# Patient Record
Sex: Female | Born: 1935 | Race: White | Hispanic: No | Smoking: Never smoker
Health system: Southern US, Community
[De-identification: ages and names within clinical notes are randomized; demographics above are authoritative.]

## PROBLEM LIST (undated history)

## (undated) DIAGNOSIS — M353 Polymyalgia rheumatica: Secondary | ICD-10-CM

## (undated) DIAGNOSIS — G8110 Spastic hemiplegia affecting unspecified side: Secondary | ICD-10-CM

## (undated) DIAGNOSIS — G459 Transient cerebral ischemic attack, unspecified: Secondary | ICD-10-CM

## (undated) DIAGNOSIS — M1990 Unspecified osteoarthritis, unspecified site: Secondary | ICD-10-CM

## (undated) DIAGNOSIS — Z8673 Personal history of transient ischemic attack (TIA), and cerebral infarction without residual deficits: Secondary | ICD-10-CM

## (undated) DIAGNOSIS — I272 Pulmonary hypertension, unspecified: Secondary | ICD-10-CM

## (undated) DIAGNOSIS — S8991XD Unspecified injury of right lower leg, subsequent encounter: Secondary | ICD-10-CM

## (undated) DIAGNOSIS — I480 Paroxysmal atrial fibrillation: Secondary | ICD-10-CM

## (undated) DIAGNOSIS — K922 Gastrointestinal hemorrhage, unspecified: Secondary | ICD-10-CM

## (undated) DIAGNOSIS — C189 Malignant neoplasm of colon, unspecified: Secondary | ICD-10-CM

## (undated) DIAGNOSIS — I639 Cerebral infarction, unspecified: Secondary | ICD-10-CM

## (undated) DIAGNOSIS — I4891 Unspecified atrial fibrillation: Secondary | ICD-10-CM

## (undated) DIAGNOSIS — R000 Tachycardia, unspecified: Secondary | ICD-10-CM

## (undated) DIAGNOSIS — I10 Essential (primary) hypertension: Secondary | ICD-10-CM

## (undated) DIAGNOSIS — I5032 Chronic diastolic (congestive) heart failure: Secondary | ICD-10-CM

## (undated) DIAGNOSIS — E162 Hypoglycemia, unspecified: Secondary | ICD-10-CM

## (undated) DIAGNOSIS — I5033 Acute on chronic diastolic (congestive) heart failure: Secondary | ICD-10-CM

## (undated) DIAGNOSIS — R0600 Dyspnea, unspecified: Secondary | ICD-10-CM

## (undated) DIAGNOSIS — R1011 Right upper quadrant pain: Secondary | ICD-10-CM

## (undated) DIAGNOSIS — M549 Dorsalgia, unspecified: Secondary | ICD-10-CM

## (undated) DIAGNOSIS — D649 Anemia, unspecified: Secondary | ICD-10-CM

## (undated) HISTORY — DX: Pulmonary hypertension, unspecified: I27.20

## (undated) HISTORY — DX: Dyspnea, unspecified: R06.00

## (undated) HISTORY — PX: EYE SURGERY: SHX253

## (undated) HISTORY — DX: Acute on chronic diastolic (congestive) heart failure: I50.33

## (undated) HISTORY — PX: BILATERAL OOPHORECTOMY: SHX1221

## (undated) HISTORY — DX: Chronic diastolic (congestive) heart failure: I50.32

## (undated) HISTORY — DX: Unspecified atrial fibrillation: I48.91

## (undated) HISTORY — DX: Malignant neoplasm of colon, unspecified: C18.9

## (undated) HISTORY — DX: Transient cerebral ischemic attack, unspecified: G45.9

## (undated) HISTORY — DX: Anemia, unspecified: D64.9

## (undated) HISTORY — DX: Right upper quadrant pain: R10.11

## (undated) HISTORY — DX: Paroxysmal atrial fibrillation: I48.0

## (undated) HISTORY — DX: Cerebral infarction, unspecified: I63.9

## (undated) HISTORY — DX: Gastrointestinal hemorrhage, unspecified: K92.2

## (undated) HISTORY — PX: COLON SURGERY: SHX602

## (undated) HISTORY — DX: Spastic hemiplegia affecting unspecified side: G81.10

## (undated) HISTORY — DX: Tachycardia, unspecified: R00.0

## (undated) HISTORY — DX: Unspecified injury of right lower leg, subsequent encounter: S89.91XD

## (undated) HISTORY — DX: Polymyalgia rheumatica: M35.3

## (undated) HISTORY — DX: Personal history of transient ischemic attack (TIA), and cerebral infarction without residual deficits: Z86.73

---

## 1999-10-15 ENCOUNTER — Inpatient Hospital Stay (HOSPITAL_COMMUNITY): Admission: EM | Admit: 1999-10-15 | Discharge: 1999-10-16 | Payer: Self-pay | Admitting: Emergency Medicine

## 1999-10-15 ENCOUNTER — Encounter: Payer: Self-pay | Admitting: Geriatric Medicine

## 1999-11-27 ENCOUNTER — Encounter: Admission: RE | Admit: 1999-11-27 | Discharge: 1999-11-27 | Payer: Self-pay | Admitting: Geriatric Medicine

## 1999-11-27 ENCOUNTER — Encounter: Payer: Self-pay | Admitting: Geriatric Medicine

## 2000-05-06 ENCOUNTER — Encounter: Admission: RE | Admit: 2000-05-06 | Discharge: 2000-05-06 | Payer: Self-pay | Admitting: Specialist

## 2000-05-06 ENCOUNTER — Encounter: Payer: Self-pay | Admitting: Specialist

## 2000-10-23 ENCOUNTER — Other Ambulatory Visit: Admission: RE | Admit: 2000-10-23 | Discharge: 2000-10-23 | Payer: Self-pay | Admitting: Geriatric Medicine

## 2000-12-10 ENCOUNTER — Encounter: Admission: RE | Admit: 2000-12-10 | Discharge: 2000-12-10 | Payer: Self-pay | Admitting: Geriatric Medicine

## 2000-12-10 ENCOUNTER — Encounter: Payer: Self-pay | Admitting: Geriatric Medicine

## 2001-12-14 ENCOUNTER — Encounter: Payer: Self-pay | Admitting: General Surgery

## 2001-12-14 ENCOUNTER — Encounter: Admission: RE | Admit: 2001-12-14 | Discharge: 2001-12-14 | Payer: Self-pay | Admitting: Geriatric Medicine

## 2002-09-20 ENCOUNTER — Encounter: Admission: RE | Admit: 2002-09-20 | Discharge: 2002-09-20 | Payer: Self-pay | Admitting: Geriatric Medicine

## 2002-09-20 ENCOUNTER — Encounter: Payer: Self-pay | Admitting: Geriatric Medicine

## 2003-01-13 ENCOUNTER — Encounter: Admission: RE | Admit: 2003-01-13 | Discharge: 2003-01-13 | Payer: Self-pay | Admitting: Geriatric Medicine

## 2003-01-13 ENCOUNTER — Encounter: Payer: Self-pay | Admitting: Geriatric Medicine

## 2004-01-22 ENCOUNTER — Encounter: Admission: RE | Admit: 2004-01-22 | Discharge: 2004-01-22 | Payer: Self-pay | Admitting: Geriatric Medicine

## 2004-08-05 ENCOUNTER — Other Ambulatory Visit: Admission: RE | Admit: 2004-08-05 | Discharge: 2004-08-05 | Payer: Self-pay | Admitting: Geriatric Medicine

## 2005-03-04 ENCOUNTER — Encounter: Admission: RE | Admit: 2005-03-04 | Discharge: 2005-03-04 | Payer: Self-pay | Admitting: Geriatric Medicine

## 2006-01-27 ENCOUNTER — Encounter: Admission: RE | Admit: 2006-01-27 | Discharge: 2006-01-27 | Payer: Self-pay | Admitting: Geriatric Medicine

## 2006-02-02 ENCOUNTER — Encounter: Admission: RE | Admit: 2006-02-02 | Discharge: 2006-02-02 | Payer: Self-pay | Admitting: Urology

## 2006-02-06 ENCOUNTER — Encounter: Admission: RE | Admit: 2006-02-06 | Discharge: 2006-02-06 | Payer: Self-pay | Admitting: Geriatric Medicine

## 2006-02-12 ENCOUNTER — Encounter: Admission: RE | Admit: 2006-02-12 | Discharge: 2006-02-12 | Payer: Self-pay | Admitting: Geriatric Medicine

## 2006-02-27 ENCOUNTER — Encounter: Admission: RE | Admit: 2006-02-27 | Discharge: 2006-02-27 | Payer: Self-pay | Admitting: Geriatric Medicine

## 2006-03-11 ENCOUNTER — Encounter: Admission: RE | Admit: 2006-03-11 | Discharge: 2006-03-11 | Payer: Self-pay | Admitting: Geriatric Medicine

## 2006-03-24 ENCOUNTER — Encounter: Admission: RE | Admit: 2006-03-24 | Discharge: 2006-03-24 | Payer: Self-pay | Admitting: Geriatric Medicine

## 2007-03-16 ENCOUNTER — Encounter: Admission: RE | Admit: 2007-03-16 | Discharge: 2007-03-16 | Payer: Self-pay | Admitting: Geriatric Medicine

## 2008-03-16 ENCOUNTER — Encounter: Admission: RE | Admit: 2008-03-16 | Discharge: 2008-03-16 | Payer: Self-pay | Admitting: Geriatric Medicine

## 2009-03-29 ENCOUNTER — Encounter: Admission: RE | Admit: 2009-03-29 | Discharge: 2009-03-29 | Payer: Self-pay | Admitting: Geriatric Medicine

## 2009-06-19 ENCOUNTER — Encounter: Admission: RE | Admit: 2009-06-19 | Discharge: 2009-06-19 | Payer: Self-pay | Admitting: Otolaryngology

## 2010-02-26 ENCOUNTER — Other Ambulatory Visit: Admission: RE | Admit: 2010-02-26 | Discharge: 2010-02-26 | Payer: Self-pay | Admitting: Geriatric Medicine

## 2010-03-25 ENCOUNTER — Encounter: Admission: RE | Admit: 2010-03-25 | Discharge: 2010-03-25 | Payer: Self-pay | Admitting: Geriatric Medicine

## 2010-11-22 NOTE — Cardiovascular Report (Signed)
[place]. [facility]  Patient:    Krystal Hensley, Krystal Hensley                       MRN: [id number] Proc. Date: 10/16/99 Attending:  Myriam Jacobson A. Fraser Din, M.D.                        Cardiac Catheterization  PROCEDURE PERFORMED:  Left heart catheterization, coronary angiography, single plane ventriculogram.  INDICATIONS FOR PROCEDURE:  Atrial fibrillation, new onset associated with positive cardiac enzymes.  DESCRIPTION OF PROCEDURE:  After obtaining written informed consent and given the patient her options, the patient was brought to the cardiac catheterization lab in the postabsorptive state.  Preop sedation was achieved using IV Versed.  The right groin was prepped and draped in the usual sterile fashion.  Local anesthesia was achieved using 1% Xylocaine.  A 6 French hemostasis sheath was placed into the right femoral artery using modified Seldinger technique.  Selective coronary angiography was performed using a JL4, JR4 Judkins catheter.  Nonionic contrast was used and was hand injected.  Single plane ventriculogram was performed in the RAO position using a 6 French pigtail curved catheter.  Nonionic contrast was used nd power injected.  All catheter exchanges were made over a guidewire.  The hemostatic sheath was placed following each injection.  There was no identifiable, critical coronary artery disease.  The hemostasis sheath was removed.  Hemostasis was achieved using digital pressure.  FINDINGS:  The aortic pressure is 170/60, LV pressure is 117/11.  Single plane ventriculogram revealed normal wall motion with an ejection fraction of 70%. No significant mitral regurgitation noted.  CORONARY ANGIOGRAPHY:  Left main coronary artery:  The left main coronary artery bifurcated into the left anterior descending and circumflex vessel.  There was o significant disease in the left main coronary artery.  Left anterior descending:  The left anterior  descending gave rise to a moderate  sized diagonal #1, small diagonal #2, moderate sized diagonal #3 and ended as an apical recurrent branch.  There was no significant disease in the left anterior  descending.  Circumflex vessel:  The circumflex vessel gave rise to a small OM-1, a large OM-2 and went on to end as an AV groove vessel.  There was a 30-40% stenosis following the second large obtuse marginal.  Right coronary artery:  The right coronary artery is a large dominant and gave ise to a small RV marginal, large PDA and large PL branch.  There was no significant disease in the right coronary artery or its branches.  IMPRESSION:  Noncritical disease in the distal circumflex vessel.  Normal left ventricular function.  RECOMMENDATIONS:  Toprol 100 mg p.o. q.d. for rate control of her inappropriate  sinus tachycardia. DD:  10/16/99 TD:  10/17/99 Job: [id number]

## 2010-11-22 NOTE — Discharge Summary (Signed)
[place]. [facility]  Patient:    Krystal Hensley, Krystal Hensley                       MRN: [id number] Adm. Date:  [id number] Disc. Date: [id number] Attending:  Ginette Otto                           Discharge Summary  ADMISSION DIAGNOSIS:  Symptomatic supraventricular tachycardia.  DISCHARGE DIAGNOSES:  1. Atrial fibrillation converted to normal sinus rhythm spontaneously.  2. Suspected coronary artery disease with normal coronary angiography.  3. Scoliosis.  4. Sigmoid colon cancer, Dukes A.  5. Presbyacusis.  6. Hypercholesterolemia.  7. Mitral valve prolapse.  8. History of mucinous tumor.  9. Hypertension. 10. Giant cell arteritis diagnosis by biopsy in January 2001.  HISTORY OF PRESENT ILLNESS:  Krystal Hensley is a very nice 77 year old white female who, on the morning of admission, had felt very weak.  She checked her blood sugar, and it was 79.  She then felt nauseated and had a sensation as if she was going to pass out.  She felt "shaky inside."  She stated that it was somewhat difficult to get her breath.  She had had right lower chest pain, but stated that that occurred after she had leaned over a chair and felt a cracking in her rib approximately two days prior to this event.  PHYSICAL EXAMINATION:  VITAL SIGNS:  Blood pressure 130/70, pulse 130, respiratory rate 24, O2 saturation 99% on room air.  HEENT:  Normal.  LUNGS:  Clear.  HEART:  Regular tachycardia without murmur.  ABDOMEN:  Soft.  No hepatosplenomegaly or masses palpated.  LABORATORY DATA:  EKG revealed SVT with secondary ST changes.  Chest x-ray revealed cardiomegaly, vascular congestion.  White count 18,000, platelet count 301,000, hemoglobin 15.5, 84% neutrophils, 11% lymphs.  Pro time 13.1, PTT 31.  Sodium 137, potassium 3.7, chloride 103, bicarbonate 32, glucose 112, BUN 17, creatinine 0.7, calcium 9.3.  CK 75, with an MB of 14.2, with an elevated relative index of 18.9,  troponin elevated at 0.14.  TSH 2.12.  HOSPITAL COURSE:  The patient was admitted to a telemetry bed.  She was seen by cardiology.  EKGs felt to be atrial fibrillation.  They also felt, with a positive troponin and CK-MBs, that she should undergo cardiac catheterization. On October 16, 1999, she underwent cardiac catheterization which revealed normal coronaries, normal left ventricular function, and they had recommended Toprol to help keep her out of the atrial fibrillation.  DISCHARGE MEDICATIONS: 1. Toprol XL 100 mg once a day. 2. Enteric-coated aspirin 325 mg a day. 3. Hydrochlorothiazide 25 mg 1/2 tablet a day. 4. Prednisone 40 mg once a day for giant cell arteritis diagnosed in January    2001. 5. Fosamax 70 mg once a week. 6. Tums E-X 2 twice a day with meals.  FOLLOW-UP:  She will be seen back in the office by Dr. Meade Maw on November 04, 1999, and then will be seen by Dr. Pete Glatter in three or four weeks. DD:  11/03/99 TD:  11/03/99 Job: [id number]

## 2010-11-22 NOTE — Consult Note (Signed)
[place]. [facility]  Patient:    Krystal Hensley, Krystal Hensley                       MRN: [id number] Attending:  Meade Maw, M.D. CC:         Hal T. Stoneking, M.D.                          Consultation Report  REFERRING PHYSICIAN:  Hal T. Stoneking, M.D.  HISTORY OF PRESENT ILLNESS:  Libia Fazzini is a 77 year old female who presented to her family doctor this morning with complaints of feeling weak, fatigued, and presyncopal.  She felt as if she was shaking inside.  This was associated with shortness of breath.  She denied complaints of chest pain, shoulder pain, arm pain, neck pain.  She attributed the symptoms to hypoglycemia.  Blood sugar was checked in the office and was noted to be 79.  ECG was performed.  The patient was noted to be in atrial fibrillation with a heart rate of 140.  She has been in her usual state of health until Sunday when she first noted the weakness and fatigue. Friday and Saturday she remained active cleaning the house, mopping, other household chores without difficulty.  Her coronary risk factors are early surgical menopause, hypertension, and age.  There is no history of tobacco use.  Her cholesterol profile is unknown.  PAST MEDICAL HISTORY: 1. Giant cell arteritis. 2. Hypertension. 3. Sigmoid colon cancer. 4. Scoliosis. 5. Hypercholesterolemia. 6. Mitral valve prolapse.  MEDICATIONS: 1. Hydrochlorothiazide 12.5 q.d. 2. Prednisone 40 mg p.o. q.d. 3. Fosamax 70 mg p.o. q.d. x 2 p.o. b.i.d.  ALLERGIES:  No known drug allergies.  PAST SURGICAL HISTORY:  Cesarean section, TAH with oophorectomy, colon cancer excision.  REVIEW OF SYSTEMS:  Patient denies palpitation.  There has been no orthopnea. There has been presyncope.  No syncope.  No chest pain.  No bright red blood per rectum.  No black tarry stools.  She has had increasing symptoms of weakness and fatigue.  Of note, she has had a tachy arrhythmia several years  past at which time a Holter monitor was placed.  The Holter monitor was unrevealing.  FAMILY HISTORY:  Noncontributory.  SOCIAL HISTORY:  She is married.  She lives with her spouse who is in fair health. She currently does not have advanced directive.  No history of alcohol or other  drugs.  PHYSICAL EXAMINATION:  VITAL SIGNS:  Blood pressure 133/76.  Heart rate 90.  She is afebrile. Telemetry is revealing sinus rhythm, shows sinus tach with a rate of 104.  HEENT:  She wears glasses.  Otherwise, unremarkable.  PULMONARY:  Breath sounds which are equal.  Clear to auscultation.  Some mild tenderness noted over the right lower rib.  CARDIOVASCULAR:  Regular rate and rhythm.  PMI is nondisplaced.  There is no rubs, murmurs, or gallops noted.  ABDOMEN:  Soft, nontender.  No hepatosplenomegaly is noted.  EXTREMITIES:  Do not reveal any peripheral edema.  SKIN:  Warm and dry.  NEUROLOGIC:  Nonfocal.  LABORATORIES:  ECG reveals a sinus tach with a PAC.  There is no acute ischemic  changes noted.  Troponin 0.14.  Potassium 3.7, creatinine 0.7.  Hematocrit 44, platelets 301. Total CK 75, CK-MB 14.  IMPRESSION:  Atrial fibrillation associated with borderline positive troponins nd elevated CK.  In view of these findings left heart catheterization was discussed with  the patient.  Risks and options were discussed with the patient.  The patient wishes to proceed with left heart catheterization coronary angiography.  She will be initiated on Lopressor 25 mg p.o. b.i.d.  This will be increased as tolerated. Heparin drip per cardiology profile.  She will also be started on an enteric coated aspirin.  Hypertension.  Her blood pressure appears to be well controlled. Further recommendations will be pending the outcome of the left heart catheterization. DD:  10/15/99 TD:  10/15/99 Job: [id number]

## 2011-03-17 ENCOUNTER — Other Ambulatory Visit: Payer: Self-pay | Admitting: Geriatric Medicine

## 2011-03-17 DIAGNOSIS — Z1231 Encounter for screening mammogram for malignant neoplasm of breast: Secondary | ICD-10-CM

## 2011-04-02 ENCOUNTER — Ambulatory Visit
Admission: RE | Admit: 2011-04-02 | Discharge: 2011-04-02 | Disposition: A | Discharge status: Home / Self Care | Payer: Medicare Other | Source: Ambulatory Visit | Attending: Geriatric Medicine | Admitting: Geriatric Medicine

## 2011-04-02 DIAGNOSIS — Z1231 Encounter for screening mammogram for malignant neoplasm of breast: Secondary | ICD-10-CM

## 2012-03-14 ENCOUNTER — Emergency Department (HOSPITAL_COMMUNITY): Payer: Medicare Other

## 2012-03-14 ENCOUNTER — Emergency Department (HOSPITAL_COMMUNITY)
Admission: EM | Admit: 2012-03-14 | Discharge: 2012-03-14 | Disposition: A | Discharge status: Home / Self Care | Payer: Medicare Other | Attending: Emergency Medicine | Admitting: Emergency Medicine

## 2012-03-14 ENCOUNTER — Encounter (HOSPITAL_COMMUNITY): Payer: Self-pay

## 2012-03-14 DIAGNOSIS — W108XXA Fall (on) (from) other stairs and steps, initial encounter: Secondary | ICD-10-CM | POA: Insufficient documentation

## 2012-03-14 DIAGNOSIS — Z7982 Long term (current) use of aspirin: Secondary | ICD-10-CM | POA: Insufficient documentation

## 2012-03-14 DIAGNOSIS — S0083XA Contusion of other part of head, initial encounter: Secondary | ICD-10-CM

## 2012-03-14 DIAGNOSIS — W19XXXA Unspecified fall, initial encounter: Secondary | ICD-10-CM

## 2012-03-14 DIAGNOSIS — S82009A Unspecified fracture of unspecified patella, initial encounter for closed fracture: Secondary | ICD-10-CM | POA: Insufficient documentation

## 2012-03-14 DIAGNOSIS — Z8739 Personal history of other diseases of the musculoskeletal system and connective tissue: Secondary | ICD-10-CM | POA: Insufficient documentation

## 2012-03-14 DIAGNOSIS — S0003XA Contusion of scalp, initial encounter: Secondary | ICD-10-CM | POA: Insufficient documentation

## 2012-03-14 DIAGNOSIS — Z85038 Personal history of other malignant neoplasm of large intestine: Secondary | ICD-10-CM | POA: Insufficient documentation

## 2012-03-14 DIAGNOSIS — I10 Essential (primary) hypertension: Secondary | ICD-10-CM | POA: Insufficient documentation

## 2012-03-14 HISTORY — DX: Hypoglycemia, unspecified: E16.2

## 2012-03-14 HISTORY — DX: Essential (primary) hypertension: I10

## 2012-03-14 HISTORY — DX: Polymyalgia rheumatica: M35.3

## 2012-03-14 HISTORY — DX: Unspecified osteoarthritis, unspecified site: M19.90

## 2012-03-14 HISTORY — DX: Dorsalgia, unspecified: M54.9

## 2012-03-14 LAB — URINALYSIS, ROUTINE W REFLEX MICROSCOPIC
Glucose, UA: NEGATIVE mg/dL
Hgb urine dipstick: NEGATIVE
Ketones, ur: 15 mg/dL — AB
Nitrite: NEGATIVE
Urobilinogen, UA: 0.2 mg/dL (ref 0.0–1.0)
pH: 7.5 (ref 5.0–8.0)

## 2012-03-14 LAB — GLUCOSE, CAPILLARY

## 2012-03-14 MED ORDER — TRAMADOL HCL 50 MG PO TABS: 50.0000 mg | ORAL_TABLET | Freq: Four times a day (QID) | ORAL | Status: AC | PRN

## 2012-03-14 NOTE — ED Provider Notes (Signed)
Medical screening examination/treatment/procedure(s) were conducted as a shared visit with non-physician practitioner(s) and myself.  I personally evaluated the patient during the encounter  Pt with left periorbital pain/left chest/left knee without focal neuro complaints, denies neck pain, left periorbital ecchymosis and left anterior knee ecchymosis.  Hurman Horn, MD 03/18/12 [phone]

## 2012-03-14 NOTE — ED Notes (Addendum)
Patient was brought in by ambulance S/P fall. Patient stated that she was going to pat her dog when she tripped and fell and landed on her on the lt side of her body. Patient noted to have a hematoma to the lt side of her face, abrasion to lt knee, skin tear to the rt hand.Patient is also complaining of pain to the lt side of her chest and lt rib onset after she fell./ Patient denies any LOC. Patient is immobilized, A/A/Ox4, skin is warm and dry, respiration is even and unlabored.

## 2012-03-14 NOTE — ED Notes (Signed)
Back board was discontinued, denies any back pain.

## 2012-03-14 NOTE — ED Provider Notes (Signed)
History     CSN: [id number]  Arrival date & time 03/14/12  1238   First MD Initiated Contact with Patient 03/14/12 1355      Chief Complaint  Patient presents with  . Fall    (Consider location/radiation/quality/duration/timing/severity/associated sxs/prior treatment) Patient is a 78 y.o. female presenting with fall. The history is provided by the patient and a relative.  Fall The accident occurred 1 to 2 hours ago. Incident: She fell forward while walking down 3 steps, falling forward hitting her left face and left knee on cement.  Pertinent negatives include no visual change, no abdominal pain, no nausea, no vomiting, no headaches and no loss of consciousness. Associated symptoms comments: No LOC, N, V. Per family at bedside, no change to her usual mentation..    Past Medical History  Diagnosis Date  . Hypertension   . Arthritis   . Irregular heart beat   . Back pain   . Cancer     colon  . Polymyalgia rheumatica   . Hypoglycemia     Past Surgical History  Procedure Date  . Colon surgery   . Cesarean section   . Bilateral oophorectomy     No family history on file.  History  Substance Use Topics  . Smoking status: Never Smoker   . Smokeless tobacco: Not on file  . Alcohol Use: No    OB History    Grav Para Term Preterm Abortions TAB SAB Ect Mult Living                  Review of Systems  HENT: Positive for facial swelling. Negative for neck pain.   Eyes: Negative for visual disturbance.  Respiratory: Negative for shortness of breath.   Cardiovascular: Negative for chest pain.  Gastrointestinal: Negative for nausea, vomiting and abdominal pain.  Musculoskeletal: Positive for joint swelling. Negative for back pain.       C/O left knee pain.  Skin:       Abrasions.  Neurological: Negative for dizziness, loss of consciousness and headaches.    Allergies  Review of patient's allergies indicates no known allergies.  Home Medications   Current  Outpatient Rx  Name Route Sig Dispense Refill  . ASPIRIN EC 81 MG PO TBEC Oral Take 81 mg by mouth daily.    Marland Kitchen CALCIUM CARBONATE-VITAMIN D 500-200 MG-UNIT PO TABS Oral Take 1 tablet by mouth daily.    Marland Kitchen HYDROCHLOROTHIAZIDE 25 MG PO TABS Oral Take 25 mg by mouth daily.    Marland Kitchen METOPROLOL SUCCINATE ER 50 MG PO TB24 Oral Take 50-75 mg by mouth 2 (two) times daily. Take 50 MG in the morning and take 75 MG in the evening.    . OCUVITE-LUTEIN PO Oral Take 1 tablet by mouth daily.    Marland Kitchen PREDNISONE 5 MG PO TABS Oral Take 5 mg by mouth daily.      BP 166/88  Pulse 96  Temp 97.7 F (36.5 C) (Oral)  Resp 18  SpO2 98%  Physical Exam  Constitutional: She appears well-developed and well-nourished. No distress.  HENT:  Head: Normocephalic.       Left facial swelling and ecchymosis temple and cheek. No suturable lacerations.   Eyes: Conjunctivae and EOM are normal.       Left pupil is 2-3 mm and sluggish, right pupil is 6-7 mm and reactionary.  Neck: Normal range of motion.  Cardiovascular: Normal rate and regular rhythm.   No murmur heard. Pulmonary/Chest: Effort normal. She has  no wheezes. She has no rales.       Mild left lateral chest wall tenderness without swelling or bruising.   Abdominal: Soft. Bowel sounds are normal. There is no tenderness. There is no rebound and no guarding.  Musculoskeletal:       Left knee is swollen and bruised anterior and anteromedial surfaces. No bony deformities. She has full strength and range of motion of knee with increased pain only at full flexion,.  Skin: Skin is warm and dry.    ED Course  Procedures (including critical care time)  Labs Reviewed  GLUCOSE, CAPILLARY - Abnormal; Notable for the following:    Glucose-Capillary 111 (*)     All other components within normal limits  URINALYSIS, ROUTINE W REFLEX MICROSCOPIC   Results for orders placed during the hospital encounter of 03/14/12  GLUCOSE, CAPILLARY      Component Value Range    Glucose-Capillary 111 (*) 70 - 99 mg/dL   Comment 1 Notify RN     Comment 2 Documented in Chart    URINALYSIS, ROUTINE W REFLEX MICROSCOPIC      Component Value Range   Color, Urine YELLOW  YELLOW   APPearance HAZY (*) CLEAR   Specific Gravity, Urine 1.014  1.005 - 1.030   pH 7.5  5.0 - 8.0   Glucose, UA NEGATIVE  NEGATIVE mg/dL   Hgb urine dipstick NEGATIVE  NEGATIVE   Bilirubin Urine NEGATIVE  NEGATIVE   Ketones, ur 15 (*) NEGATIVE mg/dL   Protein, ur NEGATIVE  NEGATIVE mg/dL   Urobilinogen, UA 0.2  0.0 - 1.0 mg/dL   Nitrite NEGATIVE  NEGATIVE   Leukocytes, UA NEGATIVE  NEGATIVE   Dg Ribs Unilateral W/chest Left  03/14/2012  *RADIOLOGY REPORT*  Clinical Data: Larey Seat.  Left rib pain.  LEFT RIBS AND CHEST - 3+ VIEW  Comparison: None.  Findings: The cardiac silhouette, mediastinal and hilar contours are within normal limits.  There is tortuosity and calcification of the thoracic aorta.  Significant thoracolumbar scoliosis.  Streaky basilar atelectasis or scarring change.  No pneumothorax.  Dedicated views of the left ribs demonstrate no definite rib fracture.  No pleural thickening or pleural effusion.  IMPRESSION:  1.  No acute cardiopulmonary findings.  Streaky bibasilar atelectasis versus scarring change. 2.  No definite acute left-sided rib fractures.   Original Report Authenticated By: P. Loralie Champagne, M.D.    Ct Head Wo Contrast  03/14/2012  *RADIOLOGY REPORT*  Clinical Data:  Fall with pain.  Hematoma left side of face.  CT HEAD WITHOUT CONTRAST CT MAXILLOFACIAL WITHOUT CONTRAST CT CERVICAL SPINE WITHOUT CONTRAST  Technique:  Multidetector CT imaging of the head, cervical spine, and maxillofacial structures were performed using the standard protocol without intravenous contrast. Multiplanar CT image reconstructions of the cervical spine and maxillofacial structures were also generated.  Comparison:  CT orbit/temporal bone 06/19/2009  CT HEAD  Findings: Soft tissue swelling and hematoma is  seen in the imaged portion of the left face, adjacent to the zygomatic arch and infraorbital region.  Please see face CT below.  Mild age-related cerebral volume loss is stable.  Minimal chronic microvascular ischemic changes of patient age.  Negative for intracranial hemorrhage, abnormal extra-axial fluid collection, hydrocephalus, or evidence of acute cortically based infarction. 10 x 6 mm calcification abutting or arising from the inner table of the right frontal skull.  This could be a densely calcified meningioma.  It has benign appearances.  There is some mucosal thickening of both sphenoid  sinuses focally. No air-fluid levels are seen in the sinuses.  IMPRESSION:  1.  No acute intracranial abnormality. 2.  Left facial soft tissue swelling/hematoma. 3.  Possible 10 mm calcified meningioma right frontal lobe. This has benign appearances.  CT MAXILLOFACIAL  Findings:  There is prominent soft tissue swelling diffusely along the inferior aspect of the left orbit and the left cheek, extending along the zygomatic arch.  There is a focal hematoma measuring 17 x 13 mm adjacent to the anterior aspect of the left zygomatic arch.  The globes, lenses, and retro-orbital fat planes and extraocular muscles appear normal bilaterally.  No acute facial bone fracture is identified.  There is slight mucosal thickening of the sphenoid sinuses.  There are no air-fluid levels in the sinuses.  IMPRESSION:  1. Soft tissue swelling and hematoma of the left face. 2.  Negative for facial bone fracture.  CT CERVICAL SPINE  Findings:   Imaged from the skull base through superior endplate of T3.  3 mm of anterolisthesis of C2 on C3.  2.5 mm anterolisthesis of C3-C4. 2.5 mm anterolisthesis of C4-C5.  The remainder of the cervical spine and upper thoracic spine vertebral bodies are normally aligned.  The facet joints are aligned.  There are facet joint degenerative changes, most prominent in the upper cervical spine, and likely the cause of  the mild anterolisthesis at several levels.  There is disc height narrowing in the cervical spine, most prominent at C5-6 and C6-7.  No acute cervical spine fracture is identified.  The thoracic aortic arch appears ectatic.  IMPRESSION:  1.  No evidence of acute bony injury to the cervical spine. 2.  Multilevel facet joint degenerative change, with slight degenerative anterolisthesis at several levels in the upper cervical spine, and degenerative disc disease in the lower cervical spine.   Original Report Authenticated By: Britta Mccreedy, M.D.    Ct Cervical Spine Wo Contrast  03/14/2012  *RADIOLOGY REPORT*  Clinical Data:  Fall with pain.  Hematoma left side of face.  CT HEAD WITHOUT CONTRAST CT MAXILLOFACIAL WITHOUT CONTRAST CT CERVICAL SPINE WITHOUT CONTRAST  Technique:  Multidetector CT imaging of the head, cervical spine, and maxillofacial structures were performed using the standard protocol without intravenous contrast. Multiplanar CT image reconstructions of the cervical spine and maxillofacial structures were also generated.  Comparison:  CT orbit/temporal bone 06/19/2009  CT HEAD  Findings: Soft tissue swelling and hematoma is seen in the imaged portion of the left face, adjacent to the zygomatic arch and infraorbital region.  Please see face CT below.  Mild age-related cerebral volume loss is stable.  Minimal chronic microvascular ischemic changes of patient age.  Negative for intracranial hemorrhage, abnormal extra-axial fluid collection, hydrocephalus, or evidence of acute cortically based infarction. 10 x 6 mm calcification abutting or arising from the inner table of the right frontal skull.  This could be a densely calcified meningioma.  It has benign appearances.  There is some mucosal thickening of both sphenoid sinuses focally. No air-fluid levels are seen in the sinuses.  IMPRESSION:  1.  No acute intracranial abnormality. 2.  Left facial soft tissue swelling/hematoma. 3.  Possible 10 mm calcified  meningioma right frontal lobe. This has benign appearances.  CT MAXILLOFACIAL  Findings:  There is prominent soft tissue swelling diffusely along the inferior aspect of the left orbit and the left cheek, extending along the zygomatic arch.  There is a focal hematoma measuring 17 x 13 mm adjacent to the anterior aspect of  the left zygomatic arch.  The globes, lenses, and retro-orbital fat planes and extraocular muscles appear normal bilaterally.  No acute facial bone fracture is identified.  There is slight mucosal thickening of the sphenoid sinuses.  There are no air-fluid levels in the sinuses.  IMPRESSION:  1. Soft tissue swelling and hematoma of the left face. 2.  Negative for facial bone fracture.  CT CERVICAL SPINE  Findings:   Imaged from the skull base through superior endplate of T3.  3 mm of anterolisthesis of C2 on C3.  2.5 mm anterolisthesis of C3-C4. 2.5 mm anterolisthesis of C4-C5.  The remainder of the cervical spine and upper thoracic spine vertebral bodies are normally aligned.  The facet joints are aligned.  There are facet joint degenerative changes, most prominent in the upper cervical spine, and likely the cause of the mild anterolisthesis at several levels.  There is disc height narrowing in the cervical spine, most prominent at C5-6 and C6-7.  No acute cervical spine fracture is identified.  The thoracic aortic arch appears ectatic.  IMPRESSION:  1.  No evidence of acute bony injury to the cervical spine. 2.  Multilevel facet joint degenerative change, with slight degenerative anterolisthesis at several levels in the upper cervical spine, and degenerative disc disease in the lower cervical spine.   Original Report Authenticated By: Britta Mccreedy, M.D.    Dg Knee Complete 4 Views Left  03/14/2012  *RADIOLOGY REPORT*  Clinical Data: Larey Seat.  Injured left knee.  LEFT KNEE - COMPLETE 4+ VIEW  Comparison: None  Findings: There are nondisplaced fractures involving the patella. An associated joint  effusion is noted.  The femur, tibia and fibula are intact.  There are mild tricompartmental degenerative changes with chondrocalcinosis.  Moderate atherosclerotic calcifications are noted.  IMPRESSION:  1.  Nondisplaced patellar fractures. 2.  Small joint effusion.   Original Report Authenticated By: P. Loralie Champagne, M.D.    Ct Maxillofacial Wo Cm  03/14/2012  *RADIOLOGY REPORT*  Clinical Data:  Fall with pain.  Hematoma left side of face.  CT HEAD WITHOUT CONTRAST CT MAXILLOFACIAL WITHOUT CONTRAST CT CERVICAL SPINE WITHOUT CONTRAST  Technique:  Multidetector CT imaging of the head, cervical spine, and maxillofacial structures were performed using the standard protocol without intravenous contrast. Multiplanar CT image reconstructions of the cervical spine and maxillofacial structures were also generated.  Comparison:  CT orbit/temporal bone 06/19/2009  CT HEAD  Findings: Soft tissue swelling and hematoma is seen in the imaged portion of the left face, adjacent to the zygomatic arch and infraorbital region.  Please see face CT below.  Mild age-related cerebral volume loss is stable.  Minimal chronic microvascular ischemic changes of patient age.  Negative for intracranial hemorrhage, abnormal extra-axial fluid collection, hydrocephalus, or evidence of acute cortically based infarction. 10 x 6 mm calcification abutting or arising from the inner table of the right frontal skull.  This could be a densely calcified meningioma.  It has benign appearances.  There is some mucosal thickening of both sphenoid sinuses focally. No air-fluid levels are seen in the sinuses.  IMPRESSION:  1.  No acute intracranial abnormality. 2.  Left facial soft tissue swelling/hematoma. 3.  Possible 10 mm calcified meningioma right frontal lobe. This has benign appearances.  CT MAXILLOFACIAL  Findings:  There is prominent soft tissue swelling diffusely along the inferior aspect of the left orbit and the left cheek, extending along the  zygomatic arch.  There is a focal hematoma measuring 17 x 13 mm adjacent  to the anterior aspect of the left zygomatic arch.  The globes, lenses, and retro-orbital fat planes and extraocular muscles appear normal bilaterally.  No acute facial bone fracture is identified.  There is slight mucosal thickening of the sphenoid sinuses.  There are no air-fluid levels in the sinuses.  IMPRESSION:  1. Soft tissue swelling and hematoma of the left face. 2.  Negative for facial bone fracture.  CT CERVICAL SPINE  Findings:   Imaged from the skull base through superior endplate of T3.  3 mm of anterolisthesis of C2 on C3.  2.5 mm anterolisthesis of C3-C4. 2.5 mm anterolisthesis of C4-C5.  The remainder of the cervical spine and upper thoracic spine vertebral bodies are normally aligned.  The facet joints are aligned.  There are facet joint degenerative changes, most prominent in the upper cervical spine, and likely the cause of the mild anterolisthesis at several levels.  There is disc height narrowing in the cervical spine, most prominent at C5-6 and C6-7.  No acute cervical spine fracture is identified.  The thoracic aortic arch appears ectatic.  IMPRESSION:  1.  No evidence of acute bony injury to the cervical spine. 2.  Multilevel facet joint degenerative change, with slight degenerative anterolisthesis at several levels in the upper cervical spine, and degenerative disc disease in the lower cervical spine.   Original Report Authenticated By: Britta Mccreedy, M.D.    No results found.   No diagnosis found.  1. Fall 2. Facial contusion 3. Patellar fracture.  MDM  CT and x-rays negative with exception of patella fracture that is nondisplaced. Patient is ambulated and is fully weight bearing on left knee. No dizziness, ataxia or gait disturbances. She can be discharged home, will ambulate with her cane and has family support. All questions answered.         Rodena Medin, PA-C 03/14/12 1729

## 2012-03-14 NOTE — ED Notes (Signed)
Patient transported to CT 

## 2012-11-01 ENCOUNTER — Ambulatory Visit: Payer: Medicare Other

## 2012-11-01 ENCOUNTER — Telehealth: Payer: Self-pay | Admitting: Family Medicine

## 2012-11-01 ENCOUNTER — Ambulatory Visit (INDEPENDENT_AMBULATORY_CARE_PROVIDER_SITE_OTHER): Payer: Medicare Other | Admitting: Family Medicine

## 2012-11-01 ENCOUNTER — Ambulatory Visit
Admission: RE | Admit: 2012-11-01 | Discharge: 2012-11-01 | Disposition: A | Discharge status: Home / Self Care | Payer: Medicare Other | Source: Ambulatory Visit | Attending: Family Medicine | Admitting: Family Medicine

## 2012-11-01 VITALS — BP 125/80 | HR 90 | Temp 98.4°F | Resp 17 | Ht 62.0 in | Wt 118.0 lb

## 2012-11-01 DIAGNOSIS — M25521 Pain in right elbow: Secondary | ICD-10-CM

## 2012-11-01 DIAGNOSIS — M542 Cervicalgia: Secondary | ICD-10-CM

## 2012-11-01 DIAGNOSIS — M25561 Pain in right knee: Secondary | ICD-10-CM

## 2012-11-01 DIAGNOSIS — M25529 Pain in unspecified elbow: Secondary | ICD-10-CM

## 2012-11-01 DIAGNOSIS — T148XXA Other injury of unspecified body region, initial encounter: Secondary | ICD-10-CM

## 2012-11-01 DIAGNOSIS — R519 Headache, unspecified: Secondary | ICD-10-CM

## 2012-11-01 DIAGNOSIS — M25569 Pain in unspecified knee: Secondary | ICD-10-CM

## 2012-11-01 DIAGNOSIS — IMO0002 Reserved for concepts with insufficient information to code with codable children: Secondary | ICD-10-CM

## 2012-11-01 DIAGNOSIS — R51 Headache: Secondary | ICD-10-CM

## 2012-11-01 LAB — POCT CBC
Granulocyte percent: 90 % — AB (ref 37–80)
HCT, POC: 46.9 % (ref 37.7–47.9)
Hemoglobin: 14.7 g/dL (ref 12.2–16.2)
Lymph, poc: 0.9 (ref 0.6–3.4)
MCH, POC: 32.1 pg — AB (ref 27–31.2)
MCHC: 31.3 g/dL — AB (ref 31.8–35.4)
MCV: 102.4 fL — AB (ref 80–97)
MID (cbc): 0.4 (ref 0–0.9)
MPV: 7.9 fL (ref 0–99.8)
POC Granulocyte: 12.4 — AB (ref 2–6.9)
POC LYMPH PERCENT: 6.8 % — AB (ref 10–50)
POC MID %: 3.2 % (ref 0–12)
Platelet Count, POC: 292 10*3/uL (ref 142–424)
RBC: 4.58 M/uL (ref 4.04–5.48)
RDW, POC: 14 %
WBC: 13.8 10*3/uL — AB (ref 4.6–10.2)

## 2012-11-01 NOTE — Patient Instructions (Addendum)
  WOUND CARE Please return in 7 days to have your stitches/staples removed or sooner if you have concerns. . Keep area clean and dry for 24 hours. Do not remove bandage, if applied. . After 24 hours, remove bandage and wash wound gently with mild soap and warm water. Reapply a new bandage after cleaning wound, if directed. . Continue daily cleansing with soap and water until stitches/staples are removed. . Do not apply any ointments or creams to the wound while stitches/staples are in place, as this may cause delayed healing. . Notify the office if you experience any of the following signs of infection: Swelling, redness, pus drainage, streaking, fever >101.0 F . Notify the office if you experience excessive bleeding that does not stop after 15-20 minutes of constant, firm pressure.   

## 2012-11-01 NOTE — Telephone Encounter (Signed)
Attempted to call but phone busy

## 2012-11-01 NOTE — Progress Notes (Signed)
[facility]:  Office Visit  Chief Complaint:  Chief Complaint  Patient presents with  . injury to right forearm    fell today hand has a tear in the skin on her RT forearm and and a small wound on her RT lower leg     HPI: Krystal Hensley is a 79 y.o. female who complains of right forearm pain and wound, bleeding s/p  fall this morning at 10 am. She stumbled on a stool in the kitchen while getting ready to mop her floors and landed on the right side and into a chair. She has facial pain with tightness. She denies LOC, hitting head but she did have her glass come off and hit her in the face. There is bruising on her eyebrow and her chin. No body witnessed  the fall. She is only on aspirin .She had 2.5 wash cloths soaked with blood. She has pain and swelling in her arm.She also has a wound in her Right leg. She is right handed. +head and facial tightness. No SOB, no CP. Last TDaP 2004. She has a history of dilated pupil on the right eye but she is unsure if that has changed. She denies osteoporosis but is on chronic prednisone for RF.   Past Medical History  Diagnosis Date  . Hypertension   . Arthritis   . Irregular heart beat   . Back pain   . Cancer     colon  . Polymyalgia rheumatica   . Hypoglycemia    Past Surgical History  Procedure Laterality Date  . Colon surgery    . Cesarean section    . Bilateral oophorectomy     History   Social History  . Marital Status: Widowed    Spouse Name: N/A    Number of Children: N/A  . Years of Education: N/A   Social History Main Topics  . Smoking status: Never Smoker   . Smokeless tobacco: None  . Alcohol Use: No  . Drug Use: No  . Sexually Active: None   Other Topics Concern  . None   Social History Narrative  . None   History reviewed. No pertinent family history. No Known Allergies Prior to Admission medications   Medication Sig Start Date End Date Taking? Authorizing Provider  aspirin EC 81 MG  tablet Take 81 mg by mouth daily.   Yes Historical Provider, MD  calcium-vitamin D (OSCAL WITH D) 500-200 MG-UNIT per tablet Take 1 tablet by mouth daily.   Yes Historical Provider, MD  hydrochlorothiazide (HYDRODIURIL) 25 MG tablet Take 25 mg by mouth daily.   Yes Historical Provider, MD  metoprolol succinate (TOPROL-XL) 50 MG 24 hr tablet Take 50-75 mg by mouth 2 (two) times daily. Take 50 MG in the morning and take 75 MG in the evening.   Yes Historical Provider, MD  Multiple Vitamins-Minerals (OCUVITE-LUTEIN PO) Take 1 tablet by mouth daily.   Yes Historical Provider, MD  predniSONE (DELTASONE) 5 MG tablet Take 5 mg by mouth daily.   Yes Historical Provider, MD     ROS: The patient denies fevers, chills, night sweats, unintentional weight loss, chest pain, palpitations, wheezing, dyspnea on exertion, nausea, vomiting, abdominal pain, dysuria, hematuria, melena, numbness, weakness, or tingling.   All other systems have been reviewed and were otherwise negative with the exception of those mentioned in the HPI and as above.    PHYSICAL EXAM: Filed Vitals:   11/01/12 1135  BP: 125/80  Pulse: 90  Temp: 98.4 F (36.9 C)  Resp: 17   Filed Vitals:   11/01/12 1135  Height: 5\' 2"  (1.575 m)  Weight: 118 lb (53.524 kg)   Body mass index is 21.58 kg/(m^2).  General: Alert, no acute distress HEENT:  Normocephalic, atraumatic, oropharynx patent. EOMI, Right pupil dilation, not equal to left. Both pupils reactive to light. Cardiovascular:  Regular rate and rhythm, no rubs murmurs or gallops.  No Carotid bruits, radial pulse intact. No pedal edema.  Respiratory: Clear to auscultation bilaterally.  No wheezes, rales, or rhonchi.  No cyanosis, no use of accessory musculature GI: No organomegaly, abdomen is soft and non-tender, positive bowel sounds.  No masses. Skin: + 4.5 x2.5 inch laceration, deep profusely bleeding Right forearm Neurologic: Facial musculature symmetric. Psychiatric: Patient  is appropriate throughout our interaction. Lymphatic: No cervical lymphadenopathy Musculoskeletal: Gait intact. + bruise right eyebrow, right chin Head and neck -normal ROM, no lumps or bumps. 5/5 strength. senstation intact Right shoulder and humerus-normal exam, nl ROM, 5/5 strength, sensation intact Right elbow-no deformities,+  tender,  nl ROM, 5/5 strength, sensation intact Right forearm-+ deep wound, + tender,  Pain with ROM, 5/5 strength, sensation intact Right wrist and hand-no deformities, + tender,  Pain with ROM, 5/5 strength, sensation intact  LABS: Results for orders placed in visit on 11/01/12  POCT CBC      Result Value Range   WBC 13.8 (*) 4.6 - 10.2 K/uL   Lymph, poc 0.9  0.6 - 3.4   POC LYMPH PERCENT 6.8 (*) 10 - 50 %L   MID (cbc) 0.4  0 - 0.9   POC MID % 3.2  0 - 12 %M   POC Granulocyte 12.4 (*) 2 - 6.9   Granulocyte percent 90.0 (*) 37 - 80 %G   RBC 4.58  4.04 - 5.48 M/uL   Hemoglobin 14.7  12.2 - 16.2 g/dL   HCT, POC 16.1  09.6 - 47.9 %   MCV 102.4 (*) 80 - 97 fL   MCH, POC 32.1 (*) 27 - 31.2 pg   MCHC 31.3 (*) 31.8 - 35.4 g/dL   RDW, POC 04.5     Platelet Count, POC 292  142 - 424 K/uL   MPV 7.9  0 - 99.8 fL     EKG/XRAY:   Primary read interpreted by Dr. Conley Rolls at [facility]. Facial bones- negative for acute fx/dislocation Right elbow-no obvious acute fx/dislocation Right forearm-no obvious acute fx/dislocation Right wrist-no obvious acute fx/dislocation Right hand-no obvious acute fx/dislocation Right tibfib-no obvious acute fx/dislocation    ASSESSMENT/PLAN: Encounter Diagnoses  Name Primary?  . Neck pain   . Pain in joint, upper arm, right   . Pain, head and face Yes  . Pain in joint, lower leg, right   . Laceration    Leukocytosis most likely related to inflammatory/reactive  reponse injury TDap given Send to get stat CT head for "head tightness and facial tighntness" s/p unwitnessed fall Wound care as directed Does not desire pain  meds Follow-up as directed in 7 days   Errik Mitchelle PHUONG, DO 11/01/2012 2:33 PM

## 2012-11-01 NOTE — Progress Notes (Signed)
Verbal consent obtained from the patient.  Local anesthesia with 10cc Lidocaine 2% without epinephrine.  Wound scrubbed with soap and water and rinsed.  Wound closed with #18 5-0 Prolene simple interrupted sutures.  Final wound length is 13 cm. Wound cleansed and dressed.

## 2012-11-02 ENCOUNTER — Telehealth: Payer: Self-pay | Admitting: Radiology

## 2012-11-02 NOTE — Telephone Encounter (Signed)
Spoke with patient's daughter and went over results. She understood and will follow up as directed. She had some questions in regards to limitations in the sling. I explained that it would be best to keep her arm and hand stabilized as much as possible. She may use her hand if necessary but to not over use her hand. They understood. She also wanted to know if it would be okay if she walked around house or if there were any limitations. I told her to rest; and it would be okay to do some walking in house, but again, to not over do it and realize she may be sore from her fall. Patient and daughter agreed, and stated understanding. No further questions.

## 2012-11-02 NOTE — Telephone Encounter (Signed)
Message copied by Marinus Maw on Tue Nov 02, 2012  8:15 AM ------      Message from: Krystal Hensley      Created: Mon Nov 01, 2012  5:21 PM       Can you let her know that head CT and also all xrays were negative.      Follow-up as directed.  ------

## 2012-11-08 ENCOUNTER — Ambulatory Visit (INDEPENDENT_AMBULATORY_CARE_PROVIDER_SITE_OTHER): Payer: Medicare Other | Admitting: Physician Assistant

## 2012-11-08 DIAGNOSIS — S51801D Unspecified open wound of right forearm, subsequent encounter: Secondary | ICD-10-CM

## 2012-11-08 DIAGNOSIS — Z5189 Encounter for other specified aftercare: Secondary | ICD-10-CM

## 2012-11-08 NOTE — Progress Notes (Signed)
  Subjective:    Patient ID: CELSA NORDAHL, female    DOB: 23-Jun-1918, 79 y.o.   MRN: [id number]  HPI 79 year old female presents for suture removal.  DOI 11/01/12. Doing well - no erythema, warmth, or drainage. Has been keeping the area covered and she has continued to use a sling.  Admits it is still oozing slightly around the area with xeroform, but overall is doing well.      Review of Systems  Constitutional: Negative for fever and chills.  Skin: Positive for wound.       Objective:   Physical Exam  Constitutional: She is oriented to person, place, and time. She appears well-developed and well-nourished.  HENT:  Head: Normocephalic and atraumatic.  Right Ear: External ear normal.  Left Ear: External ear normal.  Eyes: Conjunctivae are normal.  Neurological: She is alert and oriented to person, place, and time.  Skin:             Assessment & Plan:  Wound, open, arm, forearm, right, subsequent encounter  Patient Instructions  Keep any open areas of wound covered until closed Recommend warm, soapy soaks or a moist piece of gauze to area daily for 10-15 minutes.  Recheck in 7 days if vaseline gauze has not come off at that time.

## 2012-11-08 NOTE — Patient Instructions (Addendum)
Keep any open areas of wound covered until closed Recommend warm, soapy soaks or a moist piece of gauze to area daily for 10-15 minutes.  Recheck in 7 days if vaseline gauze has not come off at that time.

## 2012-11-27 ENCOUNTER — Ambulatory Visit: Payer: Medicare Other

## 2012-11-27 ENCOUNTER — Ambulatory Visit (INDEPENDENT_AMBULATORY_CARE_PROVIDER_SITE_OTHER): Payer: Medicare Other | Admitting: Emergency Medicine

## 2012-11-27 VITALS — BP 138/70 | HR 97 | Temp 97.7°F | Resp 18 | Ht 62.0 in | Wt 116.0 lb

## 2012-11-27 DIAGNOSIS — M79605 Pain in left leg: Secondary | ICD-10-CM

## 2012-11-27 DIAGNOSIS — M79609 Pain in unspecified limb: Secondary | ICD-10-CM

## 2012-11-27 DIAGNOSIS — R0789 Other chest pain: Secondary | ICD-10-CM

## 2012-11-27 DIAGNOSIS — M25579 Pain in unspecified ankle and joints of unspecified foot: Secondary | ICD-10-CM

## 2012-11-27 DIAGNOSIS — R071 Chest pain on breathing: Secondary | ICD-10-CM

## 2012-11-27 DIAGNOSIS — M25571 Pain in right ankle and joints of right foot: Secondary | ICD-10-CM

## 2012-11-27 NOTE — Progress Notes (Signed)
[facility] [address] [place] [address] [phone]  Date:  11/27/2012   Name:  Krystal Hensley   DOB:  [birth year]   MRN:  [id number]  PCP:  Ginette Otto, MD    Chief Complaint: Fall, Laceration, Nausea, Chest Pain and Tingling   History of Present Illness:  Krystal Hensley is a 79 y.o. very pleasant female patient who presents with the following:  Slipped on a tile floor on Thursday and injured her left calf, right ankle and anterior chest wall.  Has contusion to right wrist that is no longer painful.  Has no nausea or vomiting or shortness of breath, hemoptysis or other complaints.  Ambulatory with walker as usual.  Injury occurred while she was answering the door and slipped due to wearing socks and trying to hold herself up by the door frame.  No improvement with over the counter medications or other home remedies. Denies other complaint or health concern today.   Current on TD  There are no active problems to display for this patient.   Past Medical History  Diagnosis Date  . Hypertension   . Arthritis   . Irregular heart beat   . Back pain   . Cancer     colon  . Polymyalgia rheumatica   . Hypoglycemia     Past Surgical History  Procedure Laterality Date  . Colon surgery    . Cesarean section    . Bilateral oophorectomy      History  Substance Use Topics  . Smoking status: Never Smoker   . Smokeless tobacco: Not on file  . Alcohol Use: No    No family history on file.  No Known Allergies  Medication list has been reviewed and updated.  Current Outpatient Prescriptions on File Prior to Visit  Medication Sig Dispense Refill  . aspirin EC 81 MG tablet Take 81 mg by mouth daily.      . calcium-vitamin D (OSCAL WITH D) 500-200 MG-UNIT per tablet Take 1 tablet by mouth daily.      . hydrochlorothiazide (HYDRODIURIL) 25 MG tablet Take 25 mg by mouth daily.      . metoprolol succinate (TOPROL-XL) 50 MG 24 hr tablet Take  50-75 mg by mouth 2 (two) times daily. Take 50 MG in the morning and take 75 MG in the evening.      . Multiple Vitamins-Minerals (OCUVITE-LUTEIN PO) Take 1 tablet by mouth daily.      . predniSONE (DELTASONE) 5 MG tablet Take 5 mg by mouth daily.       No current facility-administered medications on file prior to visit.    Review of Systems:  As per HPI, otherwise negative.    Physical Examination: Filed Vitals:   11/27/12 1057  BP: 138/70  Pulse: 97  Temp: 97.7 F (36.5 C)  Resp: 18   Filed Vitals:   11/27/12 1057  Height: 5\' 2"  (1.575 m)  Weight: 116 lb (52.617 kg)   Body mass index is 21.21 kg/(m^2). Ideal Body Weight: Weight in (lb) to have BMI = 25: 136.4  GEN: WDWN, NAD, Non-toxic, A & O x 3 HEENT: contusion left lower lip, Normocephalic. Neck supple. No masses, No LAD. Ears and Nose: No external deformity. CV: RRR, No M/G/R. No JVD. No thrill. No extra heart sounds. PULM: CTA B, no wheezes, crackles, rhonchi. No retractions. No resp. distress. No accessory muscle use. Chest:  Tender lower mid anterior chest wall. ABD: S, NT,  ND, +BS. No rebound. No HSM. EXTR: No c/c/e.  Contusion and tenderness left calf with 2 cm skin tear.  Right ankle ecchymotic and tender with full AROM.   NEURO Normal gait.  PSYCH: Normally interactive. Conversant. Not depressed or anxious appearing.  Calm demeanor.    Assessment and Plan: Skin tear Contusion right ankle and chest wall Steri strips and benzoin Continue tylenol for pain  Signed,  Phillips Odor, MD   [facility] reading (PRIMARY) by  Dr. Dareen Piano.  Chest:  No acute injury.  [facility] reading (PRIMARY) by  Dr. Dareen Piano.  Ankle:  Peripheral atherosclerosis.  No osseous injury  [facility] reading (PRIMARY) by  Dr. Dareen Piano.  Tib fib:  Athero sclerosis.  No osseous injury.

## 2013-04-11 ENCOUNTER — Other Ambulatory Visit: Payer: Self-pay | Admitting: Geriatric Medicine

## 2013-04-11 ENCOUNTER — Ambulatory Visit
Admission: RE | Admit: 2013-04-11 | Discharge: 2013-04-11 | Disposition: A | Discharge status: Home / Self Care | Payer: Medicare Other | Source: Ambulatory Visit | Attending: Geriatric Medicine | Admitting: Geriatric Medicine

## 2013-04-11 DIAGNOSIS — R109 Unspecified abdominal pain: Secondary | ICD-10-CM

## 2013-04-11 DIAGNOSIS — R52 Pain, unspecified: Secondary | ICD-10-CM

## 2013-04-11 MED ORDER — IOHEXOL 300 MG/ML  SOLN
100.0000 mL | Freq: Once | INTRAMUSCULAR | Status: AC | PRN
  Administered 2013-04-11: 100 mL via INTRAVENOUS

## 2013-05-10 ENCOUNTER — Other Ambulatory Visit: Payer: Self-pay | Admitting: Physician Assistant

## 2013-08-05 ENCOUNTER — Ambulatory Visit (INDEPENDENT_AMBULATORY_CARE_PROVIDER_SITE_OTHER): Payer: Medicare Other | Admitting: Family Medicine

## 2013-08-05 ENCOUNTER — Encounter: Payer: Self-pay | Admitting: Family Medicine

## 2013-08-05 VITALS — BP 170/90 | HR 97 | Temp 97.8°F | Resp 16 | Ht 60.5 in | Wt 116.0 lb

## 2013-08-05 DIAGNOSIS — S81009A Unspecified open wound, unspecified knee, initial encounter: Secondary | ICD-10-CM

## 2013-08-05 DIAGNOSIS — S81809A Unspecified open wound, unspecified lower leg, initial encounter: Principal | ICD-10-CM

## 2013-08-05 DIAGNOSIS — M79609 Pain in unspecified limb: Secondary | ICD-10-CM

## 2013-08-05 DIAGNOSIS — S91009A Unspecified open wound, unspecified ankle, initial encounter: Principal | ICD-10-CM

## 2013-08-05 NOTE — Progress Notes (Signed)
VCO. Local anesthesia with 2% lidocaine with epinephrine. Cleaned with soap and water. SP. Distal aspect of flap tacked down with #1 SI suture. The medial border of the flap was repaired with steri strips. The lateral border edges were unable to be approximated but xeroform was applied. Patient tolerated well. Wound was cleaned and bandaged. She will recheck in 48 hours with Christell Faith, PA-C who assisted in this procedure.

## 2013-08-05 NOTE — Patient Instructions (Signed)
Take Tylenol 500 mg maximum 2 pills 3 times daily if needed for pain  Keep leg elevated to try and minimize swelling.  Return Sunday as directed  Do not shower until we give you clearance. Next visit asked again about when he can shower. In the meanwhile just use a sponge bath.  Return sooner if problems.

## 2013-08-05 NOTE — Progress Notes (Signed)
Subjective: Patient tripped over a stools and cut and tore large hole in her left shin. It is painful. It bled a lot. She is 63 and lives alone.  Objective V-shaped laceration of left lower extremity. The medial side of the wound is 5 cm, and lateral side 4 cm, for a 9 cm cut.  Assessment: Laceration left lower extremity, 9 cm  Plan: The skin is thin, and it may be difficult getting a good repair without tearing it. We will work at it. Georgiann Mccoy a PA will attend to it.  Both physician assistants, Murrell Redden and [employer], work on the wound. A couple of sutures were placed in the lateral short axis of the wound. Then it was Steri-Stripped also. The skin was unable to be used to close the whole wound, so Xeroform gauze was used to cover the remainder of the wound. It will need to be followed closely until it is healed  It took over one hour to try and work on this wound.

## 2013-08-07 ENCOUNTER — Ambulatory Visit (INDEPENDENT_AMBULATORY_CARE_PROVIDER_SITE_OTHER): Payer: Medicare Other | Admitting: Physician Assistant

## 2013-08-07 VITALS — BP 140/88 | HR 84 | Temp 97.9°F | Resp 16 | Ht 60.0 in | Wt 117.0 lb

## 2013-08-07 DIAGNOSIS — IMO0002 Reserved for concepts with insufficient information to code with codable children: Secondary | ICD-10-CM

## 2013-08-07 DIAGNOSIS — T148XXA Other injury of unspecified body region, initial encounter: Secondary | ICD-10-CM

## 2013-08-07 NOTE — Progress Notes (Signed)
   Patient ID: Krystal Hensley MRN: [id number], DOB: 02/16/1918 80 y.o. Date of Encounter: 08/07/2013, 1:54 PM  Primary Physician: [name], MD  Chief Complaint: Wound care   See previous note  HPI: 80 y.o. female presents for wound care s/p primary repair on 08/05/13 Doing well No issues or complaints Afebrile/ no chills No nausea or vomiting No pain  Daily dressing change Previous note reviewed  Past Medical History  Diagnosis Date  . Hypertension   . Arthritis   . Irregular heart beat   . Back pain   . Cancer     colon  . Polymyalgia rheumatica   . Hypoglycemia      Home Meds: Prior to Admission medications   Medication Sig Start Date End Date Taking? Authorizing Provider  acetaminophen (TYLENOL) 325 MG tablet Take 650 mg by mouth every 6 (six) hours as needed for pain.   Yes Historical Provider, MD  aspirin EC 81 MG tablet Take 81 mg by mouth daily.   Yes Historical Provider, MD  calcium-vitamin D (OSCAL WITH D) 500-200 MG-UNIT per tablet Take 1 tablet by mouth daily.   Yes Historical Provider, MD  hydrochlorothiazide (HYDRODIURIL) 25 MG tablet Take 25 mg by mouth daily.   Yes Historical Provider, MD  metoprolol succinate (TOPROL-XL) 50 MG 24 hr tablet Take 50-75 mg by mouth 2 (two) times daily. Take 50 MG in the morning and take 75 MG in the evening.   Yes Historical Provider, MD  Multiple Vitamins-Minerals (OCUVITE-LUTEIN PO) Take 1 tablet by mouth daily.   Yes Historical Provider, MD  predniSONE (DELTASONE) 5 MG tablet Take 5 mg by mouth daily.   Yes Historical Provider, MD    Allergies: No Known Allergies  ROS: Constitutional: Afebrile, no chills Dermatological: Positive for wound    EXAM: Physical Exam: Blood pressure 140/88, pulse 84, temperature 97.9 F (36.6 C), temperature source Oral, resp. rate 16, height 5' (1.524 m), weight 117 lb (53.071 kg), SpO2 100.00%., Body mass index is 22.85 kg/(m^2). General: Well developed, well nourished,  in no acute distress. Nontoxic appearing. Head: Normocephalic, atraumatic, sclera non-icteric.  Neck: Supple. Lungs: Breathing is unlabored. Heart: Normal rate. Skin:  Warm and moist. Dressing in place. V shaped wound along the left lower leg along the out portion. Wound has steri strips in place along the lateral portion and Xeroform gauze in place along the medial portion. No active bleeding. Mild TTP at the inferior aspect of the wound.  Neuro: Alert and oriented X 3. Moves all extremities spontaneously. Normal gait.  Psych:  Responds to questions appropriately with a normal affect.    A/P: 80 y.o. female with skin tear along the left lower shin  -No issues at this current time -Pain well controlled -Daily dressing changes -Recheck at suture removal  Signed, Christell Faith, MHS, PA-C [facility], [place] [address] [facility] 08/07/2013 1:54 PM

## 2013-08-08 ENCOUNTER — Emergency Department (HOSPITAL_COMMUNITY): Payer: Medicare Other

## 2013-08-08 ENCOUNTER — Inpatient Hospital Stay (HOSPITAL_COMMUNITY): Payer: Medicare Other

## 2013-08-08 ENCOUNTER — Inpatient Hospital Stay (HOSPITAL_COMMUNITY)
Admission: EM | Admit: 2013-08-08 | Discharge: 2013-08-10 | DRG: 065 | Disposition: A | Discharge status: Rehabilitation Facility | Payer: Medicare Other | Attending: Internal Medicine | Admitting: Internal Medicine

## 2013-08-08 ENCOUNTER — Encounter (HOSPITAL_COMMUNITY): Payer: Self-pay | Admitting: Emergency Medicine

## 2013-08-08 DIAGNOSIS — M6281 Muscle weakness (generalized): Secondary | ICD-10-CM

## 2013-08-08 DIAGNOSIS — Z85038 Personal history of other malignant neoplasm of large intestine: Secondary | ICD-10-CM

## 2013-08-08 DIAGNOSIS — I10 Essential (primary) hypertension: Secondary | ICD-10-CM | POA: Diagnosis present

## 2013-08-08 DIAGNOSIS — S81802A Unspecified open wound, left lower leg, initial encounter: Secondary | ICD-10-CM | POA: Diagnosis present

## 2013-08-08 DIAGNOSIS — M129 Arthropathy, unspecified: Secondary | ICD-10-CM | POA: Diagnosis present

## 2013-08-08 DIAGNOSIS — M353 Polymyalgia rheumatica: Secondary | ICD-10-CM

## 2013-08-08 DIAGNOSIS — G8190 Hemiplegia, unspecified affecting unspecified side: Secondary | ICD-10-CM | POA: Diagnosis present

## 2013-08-08 DIAGNOSIS — R29898 Other symptoms and signs involving the musculoskeletal system: Secondary | ICD-10-CM | POA: Diagnosis present

## 2013-08-08 DIAGNOSIS — I6350 Cerebral infarction due to unspecified occlusion or stenosis of unspecified cerebral artery: Principal | ICD-10-CM | POA: Diagnosis present

## 2013-08-08 DIAGNOSIS — G8110 Spastic hemiplegia affecting unspecified side: Secondary | ICD-10-CM | POA: Diagnosis present

## 2013-08-08 DIAGNOSIS — E785 Hyperlipidemia, unspecified: Secondary | ICD-10-CM | POA: Diagnosis present

## 2013-08-08 DIAGNOSIS — I639 Cerebral infarction, unspecified: Secondary | ICD-10-CM

## 2013-08-08 DIAGNOSIS — Z7982 Long term (current) use of aspirin: Secondary | ICD-10-CM

## 2013-08-08 DIAGNOSIS — IMO0002 Reserved for concepts with insufficient information to code with codable children: Secondary | ICD-10-CM

## 2013-08-08 DIAGNOSIS — I480 Paroxysmal atrial fibrillation: Secondary | ICD-10-CM | POA: Diagnosis present

## 2013-08-08 DIAGNOSIS — Z79899 Other long term (current) drug therapy: Secondary | ICD-10-CM

## 2013-08-08 DIAGNOSIS — I4891 Unspecified atrial fibrillation: Secondary | ICD-10-CM | POA: Diagnosis present

## 2013-08-08 DIAGNOSIS — I059 Rheumatic mitral valve disease, unspecified: Secondary | ICD-10-CM

## 2013-08-08 DIAGNOSIS — R531 Weakness: Secondary | ICD-10-CM

## 2013-08-08 HISTORY — DX: Polymyalgia rheumatica: M35.3

## 2013-08-08 HISTORY — DX: Spastic hemiplegia affecting unspecified side: G81.10

## 2013-08-08 HISTORY — DX: Essential (primary) hypertension: I10

## 2013-08-08 HISTORY — DX: Cerebral infarction, unspecified: I63.9

## 2013-08-08 LAB — COMPREHENSIVE METABOLIC PANEL
ALT: 15 U/L (ref 0–35)
AST: 26 U/L (ref 0–37)
Albumin: 3.5 g/dL (ref 3.5–5.2)
Alkaline Phosphatase: 93 U/L (ref 39–117)
BUN: 9 mg/dL (ref 6–23)
CALCIUM: 9 mg/dL (ref 8.4–10.5)
CO2: 24 mEq/L (ref 19–32)
Chloride: 98 mEq/L (ref 96–112)
Creatinine, Ser: 0.58 mg/dL (ref 0.50–1.10)
GFR calc Af Amer: 89 mL/min — ABNORMAL LOW (ref >90)
GFR calc non Af Amer: 77 mL/min — ABNORMAL LOW (ref >90)
Glucose, Bld: 200 mg/dL — ABNORMAL HIGH (ref 70–99)
POTASSIUM: 3.9 meq/L (ref 3.7–5.3)
SODIUM: 138 meq/L (ref 137–147)
TOTAL PROTEIN: 6.5 g/dL (ref 6.0–8.3)
Total Bilirubin: 0.6 mg/dL (ref 0.3–1.2)

## 2013-08-08 LAB — DIFFERENTIAL
Basophils Absolute: 0 10*3/uL (ref 0.0–0.1)
Basophils Relative: 0 % (ref 0–1)
EOS ABS: 0.4 10*3/uL (ref 0.0–0.7)
EOS PCT: 4 % (ref 0–5)
Lymphocytes Relative: 22 % (ref 12–46)
Lymphs Abs: 2 10*3/uL (ref 0.7–4.0)
Monocytes Absolute: 0.9 10*3/uL (ref 0.1–1.0)
Monocytes Relative: 10 % (ref 3–12)
NEUTROS PCT: 63 % (ref 43–77)
Neutro Abs: 5.6 10*3/uL (ref 1.7–7.7)

## 2013-08-08 LAB — CBC
HCT: 42.9 % (ref 36.0–46.0)
HCT: 42.4 % (ref 36.0–46.0)
Hemoglobin: 14.7 g/dL (ref 12.0–15.0)
Hemoglobin: 14.7 g/dL (ref 12.0–15.0)
MCH: 33.6 pg (ref 26.0–34.0)
MCH: 33.5 pg (ref 26.0–34.0)
MCHC: 34.3 g/dL (ref 30.0–36.0)
MCHC: 34.7 g/dL (ref 30.0–36.0)
MCV: 97.9 fL (ref 78.0–100.0)
MCV: 96.6 fL (ref 78.0–100.0)
Platelets: 209 10*3/uL (ref 150–400)
Platelets: 213 10*3/uL (ref 150–400)
RBC: 4.38 MIL/uL (ref 3.87–5.11)
RBC: 4.39 MIL/uL (ref 3.87–5.11)
RDW: 14.5 % (ref 11.5–15.5)
RDW: 14.3 % (ref 11.5–15.5)
WBC: 8.9 10*3/uL (ref 4.0–10.5)
WBC: 11 10*3/uL — ABNORMAL HIGH (ref 4.0–10.5)

## 2013-08-08 LAB — RAPID URINE DRUG SCREEN, HOSP PERFORMED
Amphetamines: NOT DETECTED
BARBITURATES: NOT DETECTED
BENZODIAZEPINES: NOT DETECTED
COCAINE: NOT DETECTED
Opiates: NOT DETECTED
TETRAHYDROCANNABINOL: NOT DETECTED

## 2013-08-08 LAB — POCT I-STAT, CHEM 8
BUN: 7 mg/dL (ref 6–23)
CALCIUM ION: 1.15 mmol/L (ref 1.13–1.30)
CHLORIDE: 99 meq/L (ref 96–112)
Creatinine, Ser: 0.7 mg/dL (ref 0.50–1.10)
GLUCOSE: 197 mg/dL — AB (ref 70–99)
HCT: 46 % (ref 36.0–46.0)
HEMOGLOBIN: 15.6 g/dL — AB (ref 12.0–15.0)
Potassium: 3.7 mEq/L (ref 3.7–5.3)
Sodium: 136 mEq/L — ABNORMAL LOW (ref 137–147)
TCO2: 25 mmol/L (ref 0–100)

## 2013-08-08 LAB — URINE MICROSCOPIC-ADD ON

## 2013-08-08 LAB — APTT: aPTT: 28 seconds (ref 24–37)

## 2013-08-08 LAB — URINALYSIS, ROUTINE W REFLEX MICROSCOPIC
BILIRUBIN URINE: NEGATIVE
Glucose, UA: NEGATIVE mg/dL
KETONES UR: NEGATIVE mg/dL
Leukocytes, UA: NEGATIVE
NITRITE: NEGATIVE
PROTEIN: NEGATIVE mg/dL
Specific Gravity, Urine: 1.01 (ref 1.005–1.030)
UROBILINOGEN UA: 0.2 mg/dL (ref 0.0–1.0)
pH: 7 (ref 5.0–8.0)

## 2013-08-08 LAB — TROPONIN I: Troponin I: <0.3 ng/mL (ref <0.30)

## 2013-08-08 LAB — CREATININE, SERUM
Creatinine, Ser: 0.51 mg/dL (ref 0.50–1.10)
GFR calc Af Amer: >90 mL/min (ref >90)
GFR calc non Af Amer: 80 mL/min — ABNORMAL LOW (ref >90)

## 2013-08-08 LAB — ETHANOL

## 2013-08-08 LAB — PROTIME-INR
INR: 0.89 (ref 0.00–1.49)
Prothrombin Time: 11.9 seconds (ref 11.6–15.2)

## 2013-08-08 LAB — HEMOGLOBIN A1C
HEMOGLOBIN A1C: 5.8 % — AB (ref <5.7)
Mean Plasma Glucose: 120 mg/dL — ABNORMAL HIGH (ref <117)

## 2013-08-08 LAB — GLUCOSE, CAPILLARY: Glucose-Capillary: 177 mg/dL — ABNORMAL HIGH (ref 70–99)

## 2013-08-08 LAB — POCT I-STAT TROPONIN I: Troponin i, poc: 0.01 ng/mL (ref 0.00–0.08)

## 2013-08-08 MED ORDER — ASPIRIN 300 MG RE SUPP: 300.0000 mg | Freq: Every day | RECTAL | Status: DC

## 2013-08-08 MED ORDER — ASPIRIN 81 MG PO CHEW: 81.0000 mg | CHEWABLE_TABLET | Freq: Every day | ORAL | Status: DC

## 2013-08-08 MED ORDER — METOPROLOL SUCCINATE ER 50 MG PO TB24
50.0000 mg | ORAL_TABLET | Freq: Every day | ORAL | Status: DC
  Administered 2013-08-08 – 2013-08-10 (×3): 50 mg via ORAL
  Filled 2013-08-08 (×4): qty 1

## 2013-08-08 MED ORDER — SODIUM CHLORIDE 0.9 % IV SOLN
INTRAVENOUS | Status: DC
  Administered 2013-08-08 – 2013-08-10 (×3): via INTRAVENOUS

## 2013-08-08 MED ORDER — ENOXAPARIN SODIUM 40 MG/0.4ML ~~LOC~~ SOLN
40.0000 mg | SUBCUTANEOUS | Status: DC
  Administered 2013-08-08 – 2013-08-09 (×2): 40 mg via SUBCUTANEOUS
  Filled 2013-08-08 (×3): qty 0.4

## 2013-08-08 MED ORDER — METOPROLOL SUCCINATE ER 50 MG PO TB24
75.0000 mg | ORAL_TABLET | Freq: Every day | ORAL | Status: DC
  Administered 2013-08-08 – 2013-08-09 (×2): 75 mg via ORAL
  Filled 2013-08-08 (×3): qty 1

## 2013-08-08 MED ORDER — ACETAMINOPHEN 325 MG PO TABS
650.0000 mg | ORAL_TABLET | Freq: Four times a day (QID) | ORAL | Status: DC | PRN
  Administered 2013-08-08 – 2013-08-09 (×2): 325 mg via ORAL
  Filled 2013-08-08 (×2): qty 2

## 2013-08-08 MED ORDER — PREDNISONE 5 MG PO TABS
5.0000 mg | ORAL_TABLET | Freq: Every day | ORAL | Status: DC
  Administered 2013-08-08 – 2013-08-10 (×3): 5 mg via ORAL
  Filled 2013-08-08 (×5): qty 1

## 2013-08-08 MED ORDER — ASPIRIN 81 MG PO CHEW
324.0000 mg | CHEWABLE_TABLET | Freq: Once | ORAL | Status: AC
  Administered 2013-08-08: 324 mg via ORAL
  Filled 2013-08-08: qty 4

## 2013-08-08 MED ORDER — ASPIRIN 81 MG PO CHEW
81.0000 mg | CHEWABLE_TABLET | Freq: Every day | ORAL | Status: DC
  Administered 2013-08-09 – 2013-08-10 (×2): 81 mg via ORAL
  Filled 2013-08-08 (×2): qty 1

## 2013-08-08 MED ORDER — ASPIRIN 325 MG PO TABS: 325.0000 mg | ORAL_TABLET | Freq: Every day | ORAL | Status: DC

## 2013-08-08 MED ORDER — ASPIRIN 300 MG RE SUPP
300.0000 mg | Freq: Every day | RECTAL | Status: DC
Start: 2013-08-08 — End: 2013-08-08

## 2013-08-08 MED ORDER — ONDANSETRON HCL 4 MG/2ML IJ SOLN: 4.0000 mg | Freq: Four times a day (QID) | INTRAMUSCULAR | Status: DC | PRN

## 2013-08-08 MED ORDER — SENNOSIDES-DOCUSATE SODIUM 8.6-50 MG PO TABS
1.0000 | ORAL_TABLET | Freq: Every evening | ORAL | Status: DC | PRN
  Filled 2013-08-08: qty 1

## 2013-08-08 NOTE — ED Notes (Signed)
Pt daughter is at bedside 

## 2013-08-08 NOTE — Progress Notes (Signed)
VASCULAR LAB PRELIMINARY  PRELIMINARY  PRELIMINARY  PRELIMINARY  Carotid duplex completed.    Preliminary report:  Bilateral:  1-39% ICA stenosis.  Vertebral artery flow is antegrade.     Roderick Sweezy, RVS 08/08/2013, 4:28 PM

## 2013-08-08 NOTE — ED Notes (Signed)
Pt in Xray at this time. [name] in Treatment room at this time.

## 2013-08-08 NOTE — H&P (Signed)
[facility] History and Physical  Krystal Hensley KXF:[id number] DOB: September 16, 1917 DOA: 08/08/2013  Referring physician: Dr. Freida Busman PCP: Ginette Otto, MD   Chief Complaint: Right-sided weakness  HPI: Krystal Hensley is a 80 y.o. female  With history of atrial fibrillation on a baby aspirin once to twice a week, hypertension, polymyalgia rheumatica and, arthritis who presents to the ED with sudden onset of right-sided weakness and a sensation of imbalance. Patient stated she woke up around 7:30 AM and finished a serial and was trying original her pills when she noted right-sided weakness which was around 8:45 AM. Patient also stated that she felt weak all over with a sensation of imbalance. Patient called her daughter who subsequently called 911 and patient was brought in as a code stroke. Patient denies any facial droop, no slurred speech, no visual changes. Patient denies any fever, no chills, no chest pain, no shortness of breath, no nausea, no vomiting, no abnormal pain, no diarrhea, no dysuria, no bowel or urinary incontinence. Patient denies any numbness or tingling. Patient was seen in the ED NIH score was 3 and a such TPA was not administered. Patient was seen by neurology in the ED head CT which was done was negative. We were called to admit the patient for further evaluation and management.   Review of Systems: As per history of present illness otherwise negative. Constitutional:  No weight loss, night sweats, Fevers, chills, fatigue.  HEENT:  No headaches, Difficulty swallowing,Tooth/dental problems,Sore throat,  No sneezing, itching, ear ache, nasal congestion, post nasal drip,  Cardio-vascular:  No chest pain, Orthopnea, PND, swelling in lower extremities, anasarca, dizziness, palpitations  GI:  No heartburn, indigestion, abdominal pain, nausea, vomiting, diarrhea, change in bowel habits, loss of appetite  Resp:  No shortness of breath with exertion or at rest. No  excess mucus, no productive cough, No non-productive cough, No coughing up of blood.No change in color of mucus.No wheezing.No chest wall deformity  Skin:  no rash or lesions.  GU:  no dysuria, change in color of urine, no urgency or frequency. No flank pain.  Musculoskeletal:  No joint pain or swelling. No decreased range of motion. No back pain.  Psych:  No change in mood or affect. No depression or anxiety. No memory loss.   Past Medical History  Diagnosis Date  . Hypertension   . Arthritis   . Irregular heart beat   . Back pain   . Cancer     colon  . Polymyalgia rheumatica   . Hypoglycemia   . HTN (hypertension) 08/08/2013  . A-fib 08/08/2013   Past Surgical History  Procedure Laterality Date  . Colon surgery    . Cesarean section    . Bilateral oophorectomy     Social History:  reports that she has never smoked. She does not have any smokeless tobacco history on file. She reports that she does not drink alcohol or use illicit drugs.  No Known Allergies  No family history on file.   Prior to Admission medications   Medication Sig Start Date End Date Taking? Authorizing Provider  acetaminophen (TYLENOL) 325 MG tablet Take 650 mg by mouth every 6 (six) hours as needed for pain.    Historical Provider, MD  aspirin EC 81 MG tablet Take 81 mg by mouth daily.    Historical Provider, MD  calcium-vitamin D (OSCAL WITH D) 500-200 MG-UNIT per tablet Take 1 tablet by mouth daily.    Historical Provider, MD  hydrochlorothiazide (HYDRODIURIL)  25 MG tablet Take 25 mg by mouth daily.    Historical Provider, MD  metoprolol succinate (TOPROL-XL) 50 MG 24 hr tablet Take 50-75 mg by mouth 2 (two) times daily. Take 50 MG in the morning and take 75 MG in the evening.    Historical Provider, MD  Multiple Vitamins-Minerals (OCUVITE-LUTEIN PO) Take 1 tablet by mouth daily.    Historical Provider, MD  predniSONE (DELTASONE) 5 MG tablet Take 5 mg by mouth daily.    Historical Provider, MD    Physical Exam: Filed Vitals:   08/08/13 1223  BP: 160/82  Pulse: 87  Temp:   Resp: 22    BP 160/82  Pulse 87  Temp(Src) 97.9 F (36.6 C) (Oral)  Resp 22  SpO2 94%  General:  Appears calm and comfortable. Frail elderly lady. Eyes: PERRLA, EOMI, normal lids, irises & conjunctiva ENT: grossly normal hearing, lips & tongue Neck: no LAD, masses or thyromegaly Cardiovascular: RRR, no m/r/g. No LE edema. Left lower extremity is bandaged. Respiratory: CTA bilaterally, no w/r/r. Normal respiratory effort. Abdomen: soft, ntnd, positive bowel sounds Skin: no rash or induration seen on limited exam Musculoskeletal: 3/5 right upper extremity strength, 3/5 right lower extremity strength, 5/5 left upper extremity strength. 5/ 5 left lower extremity strength. Psychiatric: grossly normal mood and affect, speech fluent and appropriate Neurologic: Alert and oriented x3. Cranial nerves II through XII are grossly intact. Sensation is intact. Visual fields are intact. Unable to elicit reflexes symmetrically and are diffusely. Gait not tested secondary to safety.           Labs on Admission:  Basic Metabolic Panel:  Recent Labs Lab 08/08/13 0942 08/08/13 0953  NA 138 136*  K 3.9 3.7  CL 98 99  CO2 24  --   GLUCOSE 200* 197*  BUN 9 7  CREATININE 0.58 0.70  CALCIUM 9.0  --    Liver Function Tests:  Recent Labs Lab 08/08/13 0942  AST 26  ALT 15  ALKPHOS 93  BILITOT 0.6  PROT 6.5  ALBUMIN 3.5   No results found for this basename: LIPASE, AMYLASE,  in the last 168 hours No results found for this basename: AMMONIA,  in the last 168 hours CBC:  Recent Labs Lab 08/08/13 0942 08/08/13 0953  WBC 8.9  --   NEUTROABS 5.6  --   HGB 14.7 15.6*  HCT 42.9 46.0  MCV 97.9  --   PLT 209  --    Cardiac Enzymes:  Recent Labs Lab 08/08/13 0942  TROPONINI <0.30    BNP (last 3 results) No results found for this basename: PROBNP,  in the last 8760 hours CBG:  Recent Labs Lab  08/08/13 0954  GLUCAP 177*    Radiological Exams on Admission: Ct Head Wo Contrast  08/08/2013   CLINICAL DATA:  Right arm weakness  EXAM: CT HEAD WITHOUT CONTRAST  TECHNIQUE: Contiguous axial images were obtained from the base of the skull through the vertex without intravenous contrast.  COMPARISON:  11/01/2012  FINDINGS: Bony calvarium is intact. No gross soft tissue abnormality is seen. Mild atrophic changes are noted. Chronic white matter ischemic change is seen. No findings to suggest acute hemorrhage, acute infarction or space-occupying mass lesion are noted.  IMPRESSION: Chronic changes without acute abnormality.  These results were called by telephone at the time of interpretation on 08/08/2013 at 10:22 AM to Dr. Serita Grit , who verbally acknowledged these results.   Electronically Signed   By: Inez Catalina  M.D.   On: 08/08/2013 10:23    EKG: Independently reviewed. Not done  Assessment/Plan Principal Problem:   RUE weakness Active Problems:   CVA (cerebral infarction)   HTN (hypertension)   PMR (polymyalgia rheumatica)   Right sided weakness   Wound of left leg   A-fib  #1 right upper extremity weakness/ r/o CVA Patient does have a history of A. fib on baby aspirin once to twice a week presented with right-sided weakness. CT of the head was negative. Concern for acute stroke. Will check MRI/MRA of the head. Check a fasting lipid panel. Check carotid Dopplers. Check a 2-D echo. Check a hemoglobin A1c. Place on aspirin 81 mg daily for secondary stroke prevention. Neurology has been consulted and is following the patient. PT/OT/ST. Follow.  #2 hypertension Stable. Resume home regimen of metoprolol. Hold HCTZ for now.  #3 history of atrial fibrillation per patient Continue Toprol for rate control. Aspirin.  #4 left lower extremity wound Left lower extremity his Band-Aids. We'll consult wound care for further evaluation and management.  #5 polymyalgia rheumatica Resume home  regimen of prednisone.  #6 prophylaxis Lovenox for DVT prophylaxis.  Code Status: Full Family Communication: Updated patient and daughter at bedside. Disposition Plan: Admit to telemetry  Time spent: 65 mins  [facility] MD [facility] Pager [phone]

## 2013-08-08 NOTE — ED Notes (Signed)
Patient transported to X-ray 

## 2013-08-08 NOTE — ED Provider Notes (Addendum)
I saw and evaluated the patient, reviewed the resident's note and I agree with the findings and plan.  EKG Interpretation   None      CRITICAL CARE Performed by: Serita Grit DAVID III   Total critical care time: 35  Critical care time was exclusive of separately billable procedures and treating other patients.  Critical care was necessary to treat or prevent imminent or life-threatening deterioration.  Critical care was time spent personally by me on the following activities: development of treatment plan with patient and/or surrogate as well as nursing, discussions with consultants, evaluation of patient's response to treatment, examination of patient, obtaining history from patient or surrogate, ordering and performing treatments and interventions, ordering and review of laboratory studies, ordering and review of radiographic studies, pulse oximetry and re-evaluation of patient's condition.    80 yo female with right sided arm and leg weakness starting today.  Code stroke called by EMS.  On my exam, not distress, alert and oriented, no cranial nerve deficit, 4/5 strength RUE, 3/5 strength RLE, 4+/5 strength LUE and LLE.  CT negative for bleed.  Neurology consulted and did not think tPA appropriate given low NIH stroke scale score.  Plan admit to Internal Medicine for further workup.   Clinical Impression: 1. Right sided weakness   2. CVA (cerebral infarction)   3. A-fib   4. HTN (hypertension)   5. RUE weakness       [name], MD 08/08/13 Kula III, MD 08/09/13 [phone]

## 2013-08-08 NOTE — ED Notes (Signed)
Per EMS- Pt comes from home where she lives alone. Today at [place] started having right sided weakness. Pt has drift to right arm and weakness to right leg currently. Speech is clear, no facial droop. Pt called her daughter and her daughter called 56, fire dept forced entry. BP 177/101, HR 107, RR 16, 98% RA. CBG was normal from fire dept. AFIB on monitor with HX. 20 L. AC.

## 2013-08-08 NOTE — ED Notes (Signed)
Wound RN, called reporting that she will see the patient when she goes upstairs to her room. Pt has wound to left lower leg that is bandaged with cobain.

## 2013-08-08 NOTE — ED Provider Notes (Signed)
CSN: [id number]     Arrival date & time 08/08/13  5462 History   First MD Initiated Contact with Patient 08/08/13 [phone]     Chief Complaint  Patient presents with  . Code Stroke    HPI: Ms. Forrer is a 80 yo F with history of HTN, PMR and irregular heart beat, who presents with arm and leg weakness. She was in her normal state of health until 0845 when she had acute onset right sided weakness and dizziness. No difficult speaking, drooling or trouble breathing. She has no history of CAD, CVA or PVD. She called her daughter who called EMS. On arrival to the ED she continued to have weakness, so code stroke initiated. She has no other complaints. Noted to her hypertensive to 177/101. Takes ASA 81 mg. No history of intracranial hemorrhage. No recent trauma or falls.    Past Medical History  Diagnosis Date  . Hypertension   . Arthritis   . Irregular heart beat   . Back pain   . Cancer     colon  . Polymyalgia rheumatica   . Hypoglycemia    Past Surgical History  Procedure Laterality Date  . Colon surgery    . Cesarean section    . Bilateral oophorectomy     No family history on file. History  Substance Use Topics  . Smoking status: Never Smoker   . Smokeless tobacco: Not on file  . Alcohol Use: No   OB History   Grav Para Term Preterm Abortions TAB SAB Ect Mult Living                 Review of Systems  Constitutional: Negative for fever, chills, appetite change and fatigue.  Eyes: Negative for photophobia and visual disturbance.  Respiratory: Negative for cough and shortness of breath.   Cardiovascular: Negative for chest pain and leg swelling.  Gastrointestinal: Negative for nausea, vomiting, abdominal pain, diarrhea and constipation.  Genitourinary: Negative for dysuria, frequency and decreased urine volume.  Musculoskeletal: Negative for arthralgias, back pain, gait problem and myalgias.  Skin: Negative for color change and wound.  Neurological: Positive for dizziness and  weakness (right arm and leg). Negative for syncope, light-headedness and headaches.  Psychiatric/Behavioral: Negative for confusion and agitation.  All other systems reviewed and are negative.    Allergies  Review of patient's allergies indicates no known allergies.  Home Medications   Current Outpatient Rx  Name  Route  Sig  Dispense  Refill  . acetaminophen (TYLENOL) 325 MG tablet   Oral   Take 650 mg by mouth every 6 (six) hours as needed for pain.         Marland Kitchen aspirin EC 81 MG tablet   Oral   Take 81 mg by mouth daily.         . calcium-vitamin D (OSCAL WITH D) 500-200 MG-UNIT per tablet   Oral   Take 1 tablet by mouth daily.         . hydrochlorothiazide (HYDRODIURIL) 25 MG tablet   Oral   Take 25 mg by mouth daily.         . metoprolol succinate (TOPROL-XL) 50 MG 24 hr tablet   Oral   Take 50-75 mg by mouth 2 (two) times daily. Take 50 MG in the morning and take 75 MG in the evening.         . Multiple Vitamins-Minerals (OCUVITE-LUTEIN PO)   Oral   Take 1 tablet by mouth daily.         Marland Kitchen  predniSONE (DELTASONE) 5 MG tablet   Oral   Take 5 mg by mouth daily.          BP 152/76  Pulse 90  Temp(Src) 97.9 F (36.6 C) (Oral)  Resp 16  SpO2 94% Physical Exam  Nursing note and vitals reviewed. Constitutional: She is oriented to person, place, and time. No distress.  Elderly female, laying in bed, pleasant and interactive.   HENT:  Head: Normocephalic and atraumatic.  Mouth/Throat: Oropharynx is clear and moist.  Eyes: Conjunctivae and EOM are normal. Pupils are equal, round, and reactive to light.  Neck: Normal range of motion. Neck supple.  Cardiovascular: Normal rate, regular rhythm, normal heart sounds and intact distal pulses.   Pulmonary/Chest: Effort normal and breath sounds normal. No respiratory distress.  Abdominal: Soft. Bowel sounds are normal. There is no tenderness. There is no rebound and no guarding.  Musculoskeletal: Normal range of  motion. She exhibits no edema and no tenderness.  Neurological: She is alert and oriented to person, place, and time. No cranial nerve deficit. Coordination normal.  CN 3-12 intact. Right sided pronator drift. Strength 4/5 in RUE, 3/5 in RLE, 5/5 in LUE and LLE. Normal sensation. Gait not tested.   Skin: Skin is warm and dry. No rash noted.  Psychiatric: She has a normal mood and affect. Her behavior is normal.    ED Course  Procedures (including critical care time) Labs Review Labs Reviewed  COMPREHENSIVE METABOLIC PANEL - Abnormal; Notable for the following:    Glucose, Bld 200 (*)    GFR calc non Af Amer 77 (*)    GFR calc Af Amer 89 (*)    All other components within normal limits  URINALYSIS, ROUTINE W REFLEX MICROSCOPIC - Abnormal; Notable for the following:    Hgb urine dipstick SMALL (*)    All other components within normal limits  GLUCOSE, CAPILLARY - Abnormal; Notable for the following:    Glucose-Capillary 177 (*)    All other components within normal limits  POCT I-STAT, CHEM 8 - Abnormal; Notable for the following:    Sodium 136 (*)    Glucose, Bld 197 (*)    Hemoglobin 15.6 (*)    All other components within normal limits  ETHANOL  PROTIME-INR  APTT  CBC  DIFFERENTIAL  TROPONIN I  URINE RAPID DRUG SCREEN (HOSP PERFORMED)  URINE MICROSCOPIC-ADD ON  HEMOGLOBIN A1C  CBC  CREATININE, SERUM  LIPID PANEL  POCT I-STAT TROPONIN I   Imaging Review Dg Chest 2 View  08/08/2013   CLINICAL DATA:  Stroke.  Weakness.  EXAM: CHEST  2 VIEW  COMPARISON:  By 10/24/2012  FINDINGS: Cardiac silhouette is mildly enlarged. Mediastinum is normal in contour. No hilar masses.  Clear lungs. The bony thorax is diffusely demineralized but grossly intact.  IMPRESSION: No acute cardiopulmonary disease.   Electronically Signed   By: Lajean Manes M.D.   On: 08/08/2013 13:00   Ct Head Wo Contrast  08/08/2013   CLINICAL DATA:  Right arm weakness  EXAM: CT HEAD WITHOUT CONTRAST  TECHNIQUE:  Contiguous axial images were obtained from the base of the skull through the vertex without intravenous contrast.  COMPARISON:  11/01/2012  FINDINGS: Bony calvarium is intact. No gross soft tissue abnormality is seen. Mild atrophic changes are noted. Chronic white matter ischemic change is seen. No findings to suggest acute hemorrhage, acute infarction or space-occupying mass lesion are noted.  IMPRESSION: Chronic changes without acute abnormality.  These results were called  by telephone at the time of interpretation on 08/08/2013 at 10:22 AM to Dr. Serita Grit , who verbally acknowledged these results.   Electronically Signed   By: Inez Catalina M.D.   On: 08/08/2013 10:23   Mr Virgel Paling HA Contrast  08/08/2013   ADDENDUM REPORT: 08/08/2013 14:49  ADDENDUM: These results were called by telephone at the time of interpretation on 08/08/2013 at 2:48 PM to Dr. Dorian Pod , who verbally acknowledged these results.   Electronically Signed   By: Chauncey Cruel M.D.   On: 08/08/2013 14:49   08/08/2013   CLINICAL DATA:  Not able to move right arm. History of high blood pressure and colon cancer.  EXAM: MRI HEAD WITHOUT CONTRAST  MRA HEAD WITHOUT CONTRAST  TECHNIQUE: Multiplanar, multiecho pulse sequences of the brain and surrounding structures were obtained without intravenous contrast. Angiographic images of the head were obtained using MRA technique without contrast.  COMPARISON:  08/08/2013 CT.  No comparison MR.  FINDINGS: MRI HEAD FINDINGS  Acute small infarcts scattered throughout the left frontal-parietal lobe.  No intracranial hemorrhage.  Prominent small vessel disease type changes.  Remote tiny right cerebellar infarct.  Global atrophy without hydrocephalus.  No intracranial mass lesion noted on this unenhanced exam.  Cervical spondylotic changes with spinal stenosis most prominent C3-4 level. Slight cranial settling of C1 ring and transverse ligament hypertrophy.  Pituitary region, pineal region and orbital  structures unremarkable.  Minimal mucosal thickening paranasal sinuses.  MRA HEAD FINDINGS  Cavernous segment internal carotid artery mild narrowing and ectasia.  Mild to moderate right-sided and mild left-sided supraclinoid segment internal carotid artery narrowing.  No high-grade stenosis of the carotid terminus, M1 segment of the middle cerebral artery or A1 segment of the anterior cerebral artery on either side.  Middle cerebral artery branch vessel irregularity bilaterally with decrease number of visualized left middle cerebral artery branches consistent with patient's acute infarct.  Fetal type contribution to the posterior cerebral artery bilaterally with small caliber basilar artery.  Left vertebral artery is dominant. Right vertebral artery predominantly ends in a right posterior inferior cerebellar artery distribution. Mild to moderate narrowing of portions of the distal right vertebral artery and right posterior inferior cerebellar artery.  Poor delineation of the left posterior inferior cerebellar artery.  Poor delineation of the right anterior inferior cerebellar artery.  Narrowing superior cerebellar artery bilaterally.  No aneurysm noted.  IMPRESSION: Acute small infarcts scattered throughout the left frontal-parietal lobe.  Prominent small vessel disease type changes.  Global atrophy without hydrocephalus.  Cervical spondylotic changes as noted above.  Intracranial atherosclerotic type changes as detailed above.  These results will be called to the ordering clinician or representative by the Radiologist Assistant, and communication documented in the PACS Dashboard.  Electronically Signed: By: Chauncey Cruel M.D. On: 08/08/2013 14:44   Mr Brain Wo Contrast  08/08/2013   ADDENDUM REPORT: 08/08/2013 14:49  ADDENDUM: These results were called by telephone at the time of interpretation on 08/08/2013 at 2:48 PM to Dr. Dorian Pod , who verbally acknowledged these results.   Electronically Signed   By:  Chauncey Cruel M.D.   On: 08/08/2013 14:49   08/08/2013   CLINICAL DATA:  Not able to move right arm. History of high blood pressure and colon cancer.  EXAM: MRI HEAD WITHOUT CONTRAST  MRA HEAD WITHOUT CONTRAST  TECHNIQUE: Multiplanar, multiecho pulse sequences of the brain and surrounding structures were obtained without intravenous contrast. Angiographic images of the head were obtained using  MRA technique without contrast.  COMPARISON:  08/08/2013 CT.  No comparison MR.  FINDINGS: MRI HEAD FINDINGS  Acute small infarcts scattered throughout the left frontal-parietal lobe.  No intracranial hemorrhage.  Prominent small vessel disease type changes.  Remote tiny right cerebellar infarct.  Global atrophy without hydrocephalus.  No intracranial mass lesion noted on this unenhanced exam.  Cervical spondylotic changes with spinal stenosis most prominent C3-4 level. Slight cranial settling of C1 ring and transverse ligament hypertrophy.  Pituitary region, pineal region and orbital structures unremarkable.  Minimal mucosal thickening paranasal sinuses.  MRA HEAD FINDINGS  Cavernous segment internal carotid artery mild narrowing and ectasia.  Mild to moderate right-sided and mild left-sided supraclinoid segment internal carotid artery narrowing.  No high-grade stenosis of the carotid terminus, M1 segment of the middle cerebral artery or A1 segment of the anterior cerebral artery on either side.  Middle cerebral artery branch vessel irregularity bilaterally with decrease number of visualized left middle cerebral artery branches consistent with patient's acute infarct.  Fetal type contribution to the posterior cerebral artery bilaterally with small caliber basilar artery.  Left vertebral artery is dominant. Right vertebral artery predominantly ends in a right posterior inferior cerebellar artery distribution. Mild to moderate narrowing of portions of the distal right vertebral artery and right posterior inferior cerebellar  artery.  Poor delineation of the left posterior inferior cerebellar artery.  Poor delineation of the right anterior inferior cerebellar artery.  Narrowing superior cerebellar artery bilaterally.  No aneurysm noted.  IMPRESSION: Acute small infarcts scattered throughout the left frontal-parietal lobe.  Prominent small vessel disease type changes.  Global atrophy without hydrocephalus.  Cervical spondylotic changes as noted above.  Intracranial atherosclerotic type changes as detailed above.  These results will be called to the ordering clinician or representative by the Radiologist Assistant, and communication documented in the PACS Dashboard.  Electronically Signed: By: Chauncey Cruel M.D. On: 08/08/2013 14:44      MDM   80 year old female with a history of hypertension in atrial fibrillation who presents with symptoms concerning for acute CVA. Afebrile, mildly hypertensive, otherwise hemodynamically stable. She presented within the three-hour window so a code stroke was initiated. Head CT without contrast d forid not reveal any acute abnormality. Neurology evaluated the patient, NIH stroke scale was 3. They felt her symptoms were improving so TPA was not given. Laboratory studies were unremarkable.  No evidence of ingestion or alcohol intoxication. Her glucose was normal.  Her urinalysis is without evidence of infection. The patient remained hemodynamically stable while in the emergency department. Her blood pressure was mildly elevated but this was not treated given her acute ischemic stroke. I spoke to the patient and her daughter about her diagnosis and need for admission, they were in agreement with plan.  Patient was admitted to the hospitalist service. Prior to transfer upstairs her MRI revealed multiple acute areas of infarct.  Reviewed imaging, labs, ECG, previous medical records and utilized in medical decision-making  Discuss case Dr. Doy Mince  Clinical impression 1. Acute ischemic  stroke   Louretta Shorten, MD 08/08/13 [phone]

## 2013-08-08 NOTE — ED Notes (Signed)
Attempted report 

## 2013-08-08 NOTE — ED Notes (Signed)
[facility] ordered for patient. 

## 2013-08-08 NOTE — Progress Notes (Signed)
[facility]  2D Echocardiogram completed.  [place], RDCS 08/08/2013 4:54 PM

## 2013-08-08 NOTE — ED Notes (Signed)
Dr. Thompson at bedside. 

## 2013-08-08 NOTE — ED Notes (Signed)
Pt returned from MRI °

## 2013-08-08 NOTE — Consult Note (Signed)
Referring Physician: ED    Chief Complaint: CODE STROKE: RIGHT ARM WEAKNESS.  HPI:                                                                                                                                         Krystal Hensley is an 80 y.o. female with a past medical history significant for HTN, colon cancer, polymyalgia rheumatica, arthritis, brought in by ambulance as a code stroke due to acute oonset right arm weakness. Last known well 845 am. She said that she woke up this am at 0730, was finishing up breakfast and noticed at [place] she could not move her right arm and got a wave of funny feeling. Denies HA, vertigo, double vision, slurred speech, language or vision impairment. NIHSS 3. Seems to be improving. CT brain showed no acute abnormality. Of note, patient and family report that she typically has difficulty moving her arms up.     Date last known well: 08/08/13 Time last known well: 845 am tPA Given: no NIHSS: 3 MRS:   Past Medical History  Diagnosis Date  . Hypertension   . Arthritis   . Irregular heart beat   . Back pain   . Cancer     colon  . Polymyalgia rheumatica   . Hypoglycemia     Past Surgical History  Procedure Laterality Date  . Colon surgery    . Cesarean section    . Bilateral oophorectomy      No family history on file. Social History:  reports that she has never smoked. She does not have any smokeless tobacco history on file. She reports that she does not drink alcohol or use illicit drugs.  Allergies: No Known Allergies  Medications:                                                                                                                           I have reviewed the patient's current medications.  ROS:  History obtained from the patient and chart review  General ROS: negative for - chills,  fatigue, fever, night sweats, weight gain or weight loss Psychological ROS: negative for - behavioral disorder, hallucinations, memory difficulties, mood swings or suicidal ideation Ophthalmic ROS: negative for - blurry vision, double vision, eye pain or loss of vision ENT ROS: negative for - epistaxis, nasal discharge, oral lesions, sore throat, tinnitus or vertigo Allergy and Immunology ROS: negative for - hives or itchy/watery eyes Hematological and Lymphatic ROS: negative for - bleeding problems, bruising or swollen lymph nodes Endocrine ROS: negative for - galactorrhea, hair pattern changes, polydipsia/polyuria or temperature intolerance Respiratory ROS: negative for - cough, hemoptysis, shortness of breath or wheezing Cardiovascular ROS: negative for - chest pain, dyspnea on exertion, edema or irregular heartbeat Gastrointestinal ROS: negative for - abdominal pain, diarrhea, hematemesis, nausea/vomiting or stool incontinence Genito-Urinary ROS: negative for - dysuria, hematuria, incontinence or urinary frequency/urgency Musculoskeletal ROS: negative for - joint swelling Neurological ROS: as noted in HPI Dermatological ROS: negative for rash and skin lesion changes  Physical exam: pleasant female in no apparent distress. Blood pressure 162/77, pulse 108, temperature 97.9 F (36.6 C), temperature source Oral, resp. rate 20, SpO2 100.00%. Head: normocephalic. Neck: supple, no bruits, no JVD. Cardiac: no murmurs. Lungs: clear. Abdomen: soft, no tender, no mass. Extremities: no edema.  Neurologic Examination:                                                                                                      Mental Status: Alert, oriented, thought content appropriate.  Speech fluent without evidence of aphasia.  Able to follow 3 step commands without difficulty. Cranial Nerves: II: Discs flat bilaterally; Visual fields grossly normal, pupils equal, round, reactive to light and  accommodation III,IV, VI: ptosis not present, extra-ocular motions intact bilaterally V,VII: smile symmetric, facial light touch sensation normal bilaterally VIII: hearing normal bilaterally IX,X: gag reflex present XI: bilateral shoulder shrug XII: midline tongue extension without atrophy or fasciculations  Motor: Mild weakness right arm. Tone and bulk:normal tone throughout; no atrophy noted Sensory: Pinprick and light touch intact throughout, bilaterally Deep Tendon Reflexes:  Right: Upper Extremity   Left: Upper extremity   biceps (C-5 to C-6) 2/4   biceps (C-5 to C-6) 2/4 tricep (C7) 2/4    triceps (C7) 2/4 Brachioradialis (C6) 2/4  Brachioradialis (C6) 2/4  Lower Extremity Lower Extremity  quadriceps (L-2 to L-4) 2/4   quadriceps (L-2 to L-4) 2/4 Achilles (S1) 2/4   Achilles (S1) 2/4  Plantars: Right: downgoing   Left: downgoing Cerebellar: normal finger-to-nose,  normal heel-to-shin test Gait:  No tested. CV: pulses palpable throughout    Results for orders placed during the hospital encounter of 08/08/13 (from the past 48 hour(s))  ETHANOL     Status: None   Collection Time    08/08/13  9:42 AM      Result Value Range   Alcohol, Ethyl (B) <11  0 - 11 mg/dL   Comment:            LOWEST DETECTABLE LIMIT FOR  SERUM ALCOHOL IS 11 mg/dL     FOR MEDICAL PURPOSES ONLY  PROTIME-INR     Status: None   Collection Time    08/08/13  9:42 AM      Result Value Range   Prothrombin Time 11.9  11.6 - 15.2 seconds   INR 0.89  0.00 - 1.49  APTT     Status: None   Collection Time    08/08/13  9:42 AM      Result Value Range   aPTT 28  24 - 37 seconds  CBC     Status: None   Collection Time    08/08/13  9:42 AM      Result Value Range   WBC 8.9  4.0 - 10.5 K/uL   RBC 4.38  3.87 - 5.11 MIL/uL   Hemoglobin 14.7  12.0 - 15.0 g/dL   HCT 42.9  36.0 - 46.0 %   MCV 97.9  78.0 - 100.0 fL   MCH 33.6  26.0 - 34.0 pg   MCHC 34.3  30.0 - 36.0 g/dL   RDW 14.5  11.5 - 15.5 %    Platelets 209  150 - 400 K/uL  DIFFERENTIAL     Status: None   Collection Time    08/08/13  9:42 AM      Result Value Range   Neutrophils Relative % 63  43 - 77 %   Neutro Abs 5.6  1.7 - 7.7 K/uL   Lymphocytes Relative 22  12 - 46 %   Lymphs Abs 2.0  0.7 - 4.0 K/uL   Monocytes Relative 10  3 - 12 %   Monocytes Absolute 0.9  0.1 - 1.0 K/uL   Eosinophils Relative 4  0 - 5 %   Eosinophils Absolute 0.4  0.0 - 0.7 K/uL   Basophils Relative 0  0 - 1 %   Basophils Absolute 0.0  0.0 - 0.1 K/uL  COMPREHENSIVE METABOLIC PANEL     Status: Abnormal   Collection Time    08/08/13  9:42 AM      Result Value Range   Sodium 138  137 - 147 mEq/L   Potassium 3.9  3.7 - 5.3 mEq/L   Chloride 98  96 - 112 mEq/L   CO2 24  19 - 32 mEq/L   Glucose, Bld 200 (*) 70 - 99 mg/dL   BUN 9  6 - 23 mg/dL   Creatinine, Ser 0.58  0.50 - 1.10 mg/dL   Calcium 9.0  8.4 - 10.5 mg/dL   Total Protein 6.5  6.0 - 8.3 g/dL   Albumin 3.5  3.5 - 5.2 g/dL   AST 26  0 - 37 U/L   ALT 15  0 - 35 U/L   Alkaline Phosphatase 93  39 - 117 U/L   Total Bilirubin 0.6  0.3 - 1.2 mg/dL   GFR calc non Af Amer 77 (*) >90 mL/min   GFR calc Af Amer 89 (*) >90 mL/min   Comment: (NOTE)     The eGFR has been calculated using the CKD EPI equation.     This calculation has not been validated in all clinical situations.     eGFR's persistently <90 mL/min signify possible Chronic Kidney     Disease.  TROPONIN I     Status: None   Collection Time    08/08/13  9:42 AM      Result Value Range   Troponin I <0.30  <0.30 ng/mL   Comment:  Due to the release kinetics of cTnI,     a negative result within the first hours     of the onset of symptoms does not rule out     myocardial infarction with certainty.     If myocardial infarction is still suspected,     repeat the test at appropriate intervals.  POCT I-STAT TROPONIN I     Status: None   Collection Time    08/08/13  9:51 AM      Result Value Range   Troponin i, poc 0.01   0.00 - 0.08 ng/mL   Comment 3            Comment: Due to the release kinetics of cTnI,     a negative result within the first hours     of the onset of symptoms does not rule out     myocardial infarction with certainty.     If myocardial infarction is still suspected,     repeat the test at appropriate intervals.  POCT I-STAT, CHEM 8     Status: Abnormal   Collection Time    08/08/13  9:53 AM      Result Value Range   Sodium 136 (*) 137 - 147 mEq/L   Potassium 3.7  3.7 - 5.3 mEq/L   Chloride 99  96 - 112 mEq/L   BUN 7  6 - 23 mg/dL   Creatinine, Ser 0.70  0.50 - 1.10 mg/dL   Glucose, Bld 197 (*) 70 - 99 mg/dL   Calcium, Ion 1.15  1.13 - 1.30 mmol/L   TCO2 25  0 - 100 mmol/L   Hemoglobin 15.6 (*) 12.0 - 15.0 g/dL   HCT 46.0  36.0 - 46.0 %  GLUCOSE, CAPILLARY     Status: Abnormal   Collection Time    08/08/13  9:54 AM      Result Value Range   Glucose-Capillary 177 (*) 70 - 99 mg/dL   Ct Head Wo Contrast  08/08/2013   CLINICAL DATA:  Right arm weakness  EXAM: CT HEAD WITHOUT CONTRAST  TECHNIQUE: Contiguous axial images were obtained from the base of the skull through the vertex without intravenous contrast.  COMPARISON:  11/01/2012  FINDINGS: Bony calvarium is intact. No gross soft tissue abnormality is seen. Mild atrophic changes are noted. Chronic white matter ischemic change is seen. No findings to suggest acute hemorrhage, acute infarction or space-occupying mass lesion are noted.  IMPRESSION: Chronic changes without acute abnormality.  These results were called by telephone at the time of interpretation on 08/08/2013 at 10:22 AM to Dr. Serita Grit , who verbally acknowledged these results.   Electronically Signed   By: Inez Catalina M.D.   On: 08/08/2013 10:23     Assessment: 80 y.o. female with acute onset right arm weakness. NIHSS 3, CT brain without acute abnormality. Patient is within the window for IV tpa but low NIHSS, improving in the ED, and thus thrombolysis was not  administered. Will admit to medicine and complete stroke work up.  Stroke Risk Factors - age, HTN  Plan: 1. HgbA1c, fasting lipid panel 2. MRI, MRA  of the brain without contrast 3. Echocardiogram 4. Carotid dopplers 5. Prophylactic therapy-aspirin 81 mg daily 6. Risk factor modification 7. Telemetry monitoring 8. Frequent neuro checks 9. PT/OT SLP   Dorian Pod, MD [facility] [phone]  08/08/2013, 10:43 AM

## 2013-08-08 NOTE — Code Documentation (Signed)
Code stroke called at [phone], Patient brought in to [facility] ED via EMS.  Patient states she woke up this am at 0730, was finishing up breakfast and noticed  At 0845 she could not move her right arm and got a wave of funny feeling.  Nihss 3. VSS

## 2013-08-09 ENCOUNTER — Encounter (HOSPITAL_COMMUNITY): Payer: Self-pay | Admitting: Nurse Practitioner

## 2013-08-09 DIAGNOSIS — M353 Polymyalgia rheumatica: Secondary | ICD-10-CM

## 2013-08-09 LAB — HEMOGLOBIN A1C
Hgb A1c MFr Bld: 5.9 % — ABNORMAL HIGH (ref <5.7)
Mean Plasma Glucose: 123 mg/dL — ABNORMAL HIGH (ref <117)

## 2013-08-09 LAB — CBC
HCT: 39.9 % (ref 36.0–46.0)
Hemoglobin: 13.7 g/dL (ref 12.0–15.0)
MCH: 33.7 pg (ref 26.0–34.0)
MCHC: 34.3 g/dL (ref 30.0–36.0)
MCV: 98 fL (ref 78.0–100.0)
PLATELETS: 202 10*3/uL (ref 150–400)
RBC: 4.07 MIL/uL (ref 3.87–5.11)
RDW: 14.4 % (ref 11.5–15.5)
WBC: 8.2 10*3/uL (ref 4.0–10.5)

## 2013-08-09 LAB — LIPID PANEL
CHOLESTEROL: 216 mg/dL — AB (ref 0–200)
HDL: 73 mg/dL (ref >39)
LDL Cholesterol: 120 mg/dL — ABNORMAL HIGH (ref 0–99)
Total CHOL/HDL Ratio: 3 RATIO
Triglycerides: 116 mg/dL (ref <150)
VLDL: 23 mg/dL (ref 0–40)

## 2013-08-09 LAB — BASIC METABOLIC PANEL
BUN: 8 mg/dL (ref 6–23)
CALCIUM: 8.5 mg/dL (ref 8.4–10.5)
CO2: 25 mEq/L (ref 19–32)
Chloride: 103 mEq/L (ref 96–112)
Creatinine, Ser: 0.6 mg/dL (ref 0.50–1.10)
GFR calc Af Amer: 88 mL/min — ABNORMAL LOW (ref >90)
GFR, EST NON AFRICAN AMERICAN: 76 mL/min — AB (ref >90)
Glucose, Bld: 91 mg/dL (ref 70–99)
Potassium: 3.9 mEq/L (ref 3.7–5.3)
SODIUM: 140 meq/L (ref 137–147)

## 2013-08-09 MED ORDER — SIMVASTATIN 10 MG PO TABS
10.0000 mg | ORAL_TABLET | Freq: Every day | ORAL | Status: DC
  Filled 2013-08-09 (×3): qty 1

## 2013-08-09 NOTE — Evaluation (Signed)
Speech Language Pathology Evaluation Patient Details Name: Krystal Hensley MRN: [id number] DOB: [birth year] Today's Date: 08/09/2013 Time:  - 11:18-11:26    Problem List:  Patient Active Problem List   Diagnosis Date Noted  . CVA (cerebral infarction) 08/08/2013  . RUE weakness 08/08/2013  . HTN (hypertension) 08/08/2013  . PMR (polymyalgia rheumatica) 08/08/2013  . Right sided weakness 08/08/2013  . Wound of left leg 08/08/2013  . A-fib 08/08/2013   Past Medical History:  Past Medical History  Diagnosis Date  . Hypertension   . Arthritis   . Atrial fibrillation   . Back pain   . Cancer     colon  . Polymyalgia rheumatica   . Hypoglycemia   . HTN (hypertension) 08/08/2013  . A-fib 08/08/2013   Past Surgical History:  Past Surgical History  Procedure Laterality Date  . Colon surgery    . Cesarean section    . Bilateral oophorectomy     HPI:  Krystal Hensley is a 80 y.o. female presenting with right arm weakness. Imaging confirms left frontal lobe infarcts. Infarcts felt to be embolic secondary to known atrial fibrillation.  On aspirin 81 mg twice a week prior to admission. Now on aspirin 81 mg orally every day for secondary stroke prevention. Patient with resultant mild right sided hemiparesis   Assessment / Plan / Recommendation Clinical Impression  Pt presents with functional communication and cognition.  No SLP services required.  Will sign off.      SLP Assessment  Patient does not need any further Speech Language Pathology Services    Follow Up Recommendations    none      SLP Evaluation Prior Functioning  Cognitive/Linguistic Baseline: Within functional limits   Cognition  Overall Cognitive Status: Within Functional Limits for tasks assessed    Comprehension  Auditory Comprehension Overall Auditory Comprehension: Appears within functional limits for tasks assessed Visual Recognition/Discrimination Discrimination: Within Function Limits Reading  Comprehension Reading Status: Within funtional limits    Expression Expression Primary Mode of Expression: Verbal Verbal Expression Overall Verbal Expression: Appears within functional limits for tasks assessed Written Expression Written Expression: Not tested   Oral / Motor Oral Motor/Sensory Function Overall Oral Motor/Sensory Function: Appears within functional limits for tasks assessed Motor Speech Overall Motor Speech: Appears within functional limits for tasks assessed   Ennis Heavner L. Tivis Ringer, [place] CCC/SLP Pager [phone]      Juan Quam Laurice 08/09/2013, 12:40 PM

## 2013-08-09 NOTE — Progress Notes (Signed)
Stroke Team Progress Note  HISTORY Krystal Hensley is an 80 y.o. female with a past medical history significant for HTN, colon cancer, polymyalgia rheumatica, arthritis, brought in by ambulance as a code stroke 08/08/2013 at 0936 due to acute oonset right arm weakness. Last known well 845 am 08/08/2013. She said that she woke up this am at 0730, was finishing up breakfast and noticed at [place] she could not move her right arm and got a wave of funny feeling. Denies HA, vertigo, double vision, slurred speech, language or vision impairment.  NIHSS 3. Seems to be improving. CT brain showed no acute abnormality.  Of note, patient and family report that she typically has difficulty moving her arms up. Patient was not administerd TPA secondary to low NIHSS, improving in the ED. She was admitted for further evaluation and treatment.  SUBJECTIVE Her daughter is at the bedside.  Overall she feels her condition is stable. Both are very hesitant about anticoagulation/antiplatelets and bleeding risk.  OBJECTIVE Most recent Vital Signs: Filed Vitals:   08/09/13 0002 08/09/13 0209 08/09/13 0403 08/09/13 0608  BP: 145/70 154/68 156/64 164/74  Pulse: 75 78 74 74  Temp: 97.8 F (36.6 C) 98.2 F (36.8 C) 98.1 F (36.7 C) 98.3 F (36.8 C)  TempSrc: Oral Oral Oral Oral  Resp:  20 18 20   Height:      Weight:      SpO2: 96% 94% 95% 97%   CBG (last 3)   Recent Labs  08/08/13 0954  GLUCAP 177*    IV Fluid Intake:   . sodium chloride 75 mL/hr at 08/08/13 1312    MEDICATIONS  . aspirin  81 mg Oral Daily  . enoxaparin (LOVENOX) injection  40 mg Subcutaneous Q24H  . metoprolol succinate  50 mg Oral Daily  . metoprolol succinate  75 mg Oral QHS  . predniSONE  5 mg Oral Q breakfast   PRN:  acetaminophen, ondansetron, senna-docusate  Diet:  Cardiac thin liquids Activity:  Bedrest, OOB with assistance DVT Prophylaxis:  Lovenox 40 mg sq daily   CLINICALLY SIGNIFICANT STUDIES Basic Metabolic Panel:   Recent Labs Lab 08/08/13 0942 08/08/13 0953 08/08/13 1141 08/09/13 0326  NA 138 136*  --  140  K 3.9 3.7  --  3.9  CL 98 99  --  103  CO2 24  --   --  25  GLUCOSE 200* 197*  --  91  BUN 9 7  --  8  CREATININE 0.58 0.70 0.51 0.60  CALCIUM 9.0  --   --  8.5   Liver Function Tests:  Recent Labs Lab 08/08/13 0942  AST 26  ALT 15  ALKPHOS 93  BILITOT 0.6  PROT 6.5  ALBUMIN 3.5   CBC:  Recent Labs Lab 08/08/13 0942  08/08/13 1141 08/09/13 0326  WBC 8.9  --  11.0* 8.2  NEUTROABS 5.6  --   --   --   HGB 14.7  < > 14.7 13.7  HCT 42.9  < > 42.4 39.9  MCV 97.9  --  96.6 98.0  PLT 209  --  213 202  < > = values in this interval not displayed. Coagulation:  Recent Labs Lab 08/08/13 0942  LABPROT 11.9  INR 0.89   Cardiac Enzymes:  Recent Labs Lab 08/08/13 0942  TROPONINI <0.30   Urinalysis:  Recent Labs Lab 08/08/13 1135  COLORURINE YELLOW  LABSPEC 1.010  PHURINE 7.0  GLUCOSEU NEGATIVE  HGBUR SMALL*  BILIRUBINUR NEGATIVE  KETONESUR NEGATIVE  PROTEINUR NEGATIVE  UROBILINOGEN 0.2  NITRITE NEGATIVE  LEUKOCYTESUR NEGATIVE   Lipid Panel    Component Value Date/Time   CHOL 216* 08/09/2013 0326   TRIG 116 08/09/2013 0326   HDL 73 08/09/2013 0326   CHOLHDL 3.0 08/09/2013 0326   VLDL 23 08/09/2013 0326   LDLCALC 120* 08/09/2013 0326   HgbA1C  Lab Results  Component Value Date   HGBA1C 5.8* 08/08/2013    Urine Drug Screen:     Component Value Date/Time   LABOPIA NONE DETECTED 08/08/2013 1135   COCAINSCRNUR NONE DETECTED 08/08/2013 1135   LABBENZ NONE DETECTED 08/08/2013 1135   AMPHETMU NONE DETECTED 08/08/2013 1135   THCU NONE DETECTED 08/08/2013 1135   LABBARB NONE DETECTED 08/08/2013 1135    Alcohol Level:  Recent Labs Lab 08/08/13 0942  ETH <11    CT of the brain  08/08/2013    Chronic changes without acute abnormality.  MRI of the brain  08/08/2013  Acute small infarcts scattered throughout the left frontal-parietal lobe.  Prominent small vessel disease type  changes.  Global atrophy without hydrocephalus.  Cervical spondylotic changes  MRA of the brain  08/08/2013   Intracranial atherosclerotic type changes   2D Echocardiogram    Carotid Doppler  No evidence of hemodynamically significant internal carotid artery stenosis. Vertebral artery flow is antegrade.   CXR  08/08/2013    No acute cardiopulmonary disease.    EKG    Therapy Recommendations   Physical Exam   Pleasant elderly female not in distress.Awake alert. Afebrile. Head is nontraumatic. Neck is supple without bruit. Hearing is normal. Cardiac exam no murmur or gallop. Lungs are clear to auscultation. Distal pulses are well felt. Neurological Exam : Awake alert oriented x 32. Diminished recall and registration. normal speech and language. Mild right lower face asymmetry. Tongue midline. No drift. Mild diminished fine finger movements on right. Orbits left over right upper extremity. Mild right grip weak.. Normal sensation . Normal coordination. ASSESSMENT Krystal Hensley is a 80 y.o. female presenting with right arm weakness. Imaging confirms left frontal lobe infarcts. Infarcts felt to be embolic secondary to known atrial fibrillation.  On aspirin 81 mg twice a week prior to admission. Now on aspirin 81 mg orally every day for secondary stroke prevention. Patient with resultant mild right sided hemiparesis. Work up underway.  Hypertension Hyperlipidemia, LDL 120, on no statin PTA, now on no statin, goal LDL < 100 Hx atrial fibrillation , documented in Dr. Bonnita Nasuti Preston's note from 4/1/02001 [facility] day # 1  TREATMENT/PLAN  Not an anticoagulation candidate due to fall risk and bleeding risk   Continue aspirin 81 mg orally every day for secondary stroke prevention. This is a bleeding risk. Patient and family are aware.  Add low dose statin   F/u 2D  Therapy evals  Burnetta Sabin, MSN, RN, ANVP-BC, ANP-BC, GNP-BC [facility] Pager: [phone] 08/09/2013 8:35  AM  I have personally obtained a history, examined the patient, evaluated imaging results, and formulated the assessment and plan of care. I agree with the above. Antony Contras, MD

## 2013-08-09 NOTE — Progress Notes (Signed)
Utilization review completed.  

## 2013-08-09 NOTE — Progress Notes (Signed)
[facility] PROGRESS NOTE  Krystal Hensley [id number] DOB: 13-Jul-1917 DOA: 08/08/2013 PCP: [name], MD  Assessment/Plan: Acute Left Brain CVA -Continue ASA for secondary stroke prevention. -Has a remote h/o a fib; may need to consider anticoagulation. -ECHO pending. -Dopplers: Bilateral: 1-39% ICA stenosis. Vertebral artery flow is antegrade. -Therapy evals pending.  PMR -Continue prednisone.  HTN -BP a little elevated. -Permissive HTN in the face of acute CVA.   Code Status: Full Code Family Communication: Daughter at bedside updated on plan of care.  Disposition Plan: To be determined; likely SNF.   Consultants:  Neurology   Antibiotics:  None   Subjective: No complaints  Objective: Filed Vitals:   08/09/13 0403 08/09/13 0608 08/09/13 0800 08/09/13 1204  BP: 156/64 164/74 156/75 155/97  Pulse: 74 74 118 70  Temp: 98.1 F (36.7 C) 98.3 F (36.8 C) 97.6 F (36.4 C) 97.5 F (36.4 C)  TempSrc: Oral Oral Oral Oral  Resp: 18 20 20 20   Height:      Weight:      SpO2: 95% 97% 98% 98%   No intake or output data in the 24 hours ending 08/09/13 1419 Filed Weights   08/08/13 1935  Weight: 53.071 kg (117 lb)    Exam:   General:  AA Ox3  Cardiovascular: RRR  Respiratory: CTA B  Abdomen: S/NT/ND/+BS  Extremities: no C/C/E   Neurologic:  Right arm hemiplegis  Data Reviewed: Basic Metabolic Panel:  Recent Labs Lab 08/08/13 0942 08/08/13 0953 08/08/13 1141 08/09/13 0326  NA 138 136*  --  140  K 3.9 3.7  --  3.9  CL 98 99  --  103  CO2 24  --   --  25  GLUCOSE 200* 197*  --  91  BUN 9 7  --  8  CREATININE 0.58 0.70 0.51 0.60  CALCIUM 9.0  --   --  8.5   Liver Function Tests:  Recent Labs Lab 08/08/13 0942  AST 26  ALT 15  ALKPHOS 93  BILITOT 0.6  PROT 6.5  ALBUMIN 3.5   No results found for this basename: LIPASE, AMYLASE,  in the last 168 hours No results found for this basename: AMMONIA,  in the last 168  hours CBC:  Recent Labs Lab 08/08/13 0942 08/08/13 0953 08/08/13 1141 08/09/13 0326  WBC 8.9  --  11.0* 8.2  NEUTROABS 5.6  --   --   --   HGB 14.7 15.6* 14.7 13.7  HCT 42.9 46.0 42.4 39.9  MCV 97.9  --  96.6 98.0  PLT 209  --  213 202   Cardiac Enzymes:  Recent Labs Lab 08/08/13 0942  TROPONINI <0.30   BNP (last 3 results) No results found for this basename: PROBNP,  in the last 8760 hours CBG:  Recent Labs Lab 08/08/13 0954  GLUCAP 177*    No results found for this or any previous visit (from the past 240 hour(s)).   Studies: Dg Chest 2 View  08/08/2013   CLINICAL DATA:  Stroke.  Weakness.  EXAM: CHEST  2 VIEW  COMPARISON:  By 10/24/2012  FINDINGS: Cardiac silhouette is mildly enlarged. Mediastinum is normal in contour. No hilar masses.  Clear lungs. The bony thorax is diffusely demineralized but grossly intact.  IMPRESSION: No acute cardiopulmonary disease.   Electronically Signed   By: Lajean Manes M.D.   On: 08/08/2013 13:00   Ct Head Wo Contrast  08/08/2013   CLINICAL DATA:  Right arm weakness  EXAM: CT HEAD WITHOUT CONTRAST  TECHNIQUE: Contiguous axial images were obtained from the base of the skull through the vertex without intravenous contrast.  COMPARISON:  11/01/2012  FINDINGS: Bony calvarium is intact. No gross soft tissue abnormality is seen. Mild atrophic changes are noted. Chronic white matter ischemic change is seen. No findings to suggest acute hemorrhage, acute infarction or space-occupying mass lesion are noted.  IMPRESSION: Chronic changes without acute abnormality.  These results were called by telephone at the time of interpretation on 08/08/2013 at 10:22 AM to Dr. Serita Grit , who verbally acknowledged these results.   Electronically Signed   By: Inez Catalina M.D.   On: 08/08/2013 10:23   Mr Virgel Paling ZO Contrast  08/08/2013   ADDENDUM REPORT: 08/08/2013 14:49  ADDENDUM: These results were called by telephone at the time of interpretation on 08/08/2013 at  2:48 PM to Dr. Dorian Pod , who verbally acknowledged these results.   Electronically Signed   By: Chauncey Cruel M.D.   On: 08/08/2013 14:49   08/08/2013   CLINICAL DATA:  Not able to move right arm. History of high blood pressure and colon cancer.  EXAM: MRI HEAD WITHOUT CONTRAST  MRA HEAD WITHOUT CONTRAST  TECHNIQUE: Multiplanar, multiecho pulse sequences of the brain and surrounding structures were obtained without intravenous contrast. Angiographic images of the head were obtained using MRA technique without contrast.  COMPARISON:  08/08/2013 CT.  No comparison MR.  FINDINGS: MRI HEAD FINDINGS  Acute small infarcts scattered throughout the left frontal-parietal lobe.  No intracranial hemorrhage.  Prominent small vessel disease type changes.  Remote tiny right cerebellar infarct.  Global atrophy without hydrocephalus.  No intracranial mass lesion noted on this unenhanced exam.  Cervical spondylotic changes with spinal stenosis most prominent C3-4 level. Slight cranial settling of C1 ring and transverse ligament hypertrophy.  Pituitary region, pineal region and orbital structures unremarkable.  Minimal mucosal thickening paranasal sinuses.  MRA HEAD FINDINGS  Cavernous segment internal carotid artery mild narrowing and ectasia.  Mild to moderate right-sided and mild left-sided supraclinoid segment internal carotid artery narrowing.  No high-grade stenosis of the carotid terminus, M1 segment of the middle cerebral artery or A1 segment of the anterior cerebral artery on either side.  Middle cerebral artery branch vessel irregularity bilaterally with decrease number of visualized left middle cerebral artery branches consistent with patient's acute infarct.  Fetal type contribution to the posterior cerebral artery bilaterally with small caliber basilar artery.  Left vertebral artery is dominant. Right vertebral artery predominantly ends in a right posterior inferior cerebellar artery distribution. Mild to moderate  narrowing of portions of the distal right vertebral artery and right posterior inferior cerebellar artery.  Poor delineation of the left posterior inferior cerebellar artery.  Poor delineation of the right anterior inferior cerebellar artery.  Narrowing superior cerebellar artery bilaterally.  No aneurysm noted.  IMPRESSION: Acute small infarcts scattered throughout the left frontal-parietal lobe.  Prominent small vessel disease type changes.  Global atrophy without hydrocephalus.  Cervical spondylotic changes as noted above.  Intracranial atherosclerotic type changes as detailed above.  These results will be called to the ordering clinician or representative by the Radiologist Assistant, and communication documented in the PACS Dashboard.  Electronically Signed: By: Chauncey Cruel M.D. On: 08/08/2013 14:44   Mr Brain Wo Contrast  08/08/2013   ADDENDUM REPORT: 08/08/2013 14:49  ADDENDUM: These results were called by telephone at the time of interpretation on 08/08/2013 at 2:48 PM to Dr. Dorian Pod ,  who verbally acknowledged these results.   Electronically Signed   By: Chauncey Cruel M.D.   On: 08/08/2013 14:49   08/08/2013   CLINICAL DATA:  Not able to move right arm. History of high blood pressure and colon cancer.  EXAM: MRI HEAD WITHOUT CONTRAST  MRA HEAD WITHOUT CONTRAST  TECHNIQUE: Multiplanar, multiecho pulse sequences of the brain and surrounding structures were obtained without intravenous contrast. Angiographic images of the head were obtained using MRA technique without contrast.  COMPARISON:  08/08/2013 CT.  No comparison MR.  FINDINGS: MRI HEAD FINDINGS  Acute small infarcts scattered throughout the left frontal-parietal lobe.  No intracranial hemorrhage.  Prominent small vessel disease type changes.  Remote tiny right cerebellar infarct.  Global atrophy without hydrocephalus.  No intracranial mass lesion noted on this unenhanced exam.  Cervical spondylotic changes with spinal stenosis most prominent  C3-4 level. Slight cranial settling of C1 ring and transverse ligament hypertrophy.  Pituitary region, pineal region and orbital structures unremarkable.  Minimal mucosal thickening paranasal sinuses.  MRA HEAD FINDINGS  Cavernous segment internal carotid artery mild narrowing and ectasia.  Mild to moderate right-sided and mild left-sided supraclinoid segment internal carotid artery narrowing.  No high-grade stenosis of the carotid terminus, M1 segment of the middle cerebral artery or A1 segment of the anterior cerebral artery on either side.  Middle cerebral artery branch vessel irregularity bilaterally with decrease number of visualized left middle cerebral artery branches consistent with patient's acute infarct.  Fetal type contribution to the posterior cerebral artery bilaterally with small caliber basilar artery.  Left vertebral artery is dominant. Right vertebral artery predominantly ends in a right posterior inferior cerebellar artery distribution. Mild to moderate narrowing of portions of the distal right vertebral artery and right posterior inferior cerebellar artery.  Poor delineation of the left posterior inferior cerebellar artery.  Poor delineation of the right anterior inferior cerebellar artery.  Narrowing superior cerebellar artery bilaterally.  No aneurysm noted.  IMPRESSION: Acute small infarcts scattered throughout the left frontal-parietal lobe.  Prominent small vessel disease type changes.  Global atrophy without hydrocephalus.  Cervical spondylotic changes as noted above.  Intracranial atherosclerotic type changes as detailed above.  These results will be called to the ordering clinician or representative by the Radiologist Assistant, and communication documented in the PACS Dashboard.  Electronically Signed: By: Chauncey Cruel M.D. On: 08/08/2013 14:44    Scheduled Meds: . aspirin  81 mg Oral Daily  . enoxaparin (LOVENOX) injection  40 mg Subcutaneous Q24H  . metoprolol succinate  50 mg  Oral Daily  . metoprolol succinate  75 mg Oral QHS  . predniSONE  5 mg Oral Q breakfast  . simvastatin  10 mg Oral q1800   Continuous Infusions: . sodium chloride 75 mL/hr at 08/08/13 1312    Principal Problem:   RUE weakness Active Problems:   CVA (cerebral infarction)   HTN (hypertension)   PMR (polymyalgia rheumatica)   Right sided weakness   Wound of left leg   A-fib    Time spent: 35 minutes. Greater than 50% of this time was spent in direct contact with the patient coordinating care.    Lelon Frohlich  [facility] Pager [phone]  If 7PM-7AM, please contact night-coverage at [email or web address], password [facility] 08/09/2013, 2:19 PM  LOS: 1 day

## 2013-08-09 NOTE — Evaluation (Signed)
Physical Therapy Evaluation Patient Details Name: Krystal Hensley MRN: [id number] DOB: 1918-03-23 Today's Date: 08/09/2013 Time: 8657-8469 PT Time Calculation (min): 60 min  PT Assessment / Plan / Recommendation History of Present Illness  80 y.o. female admitted to [facility] on 08/08/13 with history of atrial fibrillation on a baby aspirin once to twice a week, hypertension, polymyalgia rheumatica and, arthritis who presents to the ED with sudden onset of right-sided weakness and a sensation of imbalance.  Imaging confirms left frontal lobe infarcts. Infarcts felt to be embolic secondary to known atrial fibrillation.   Clinical Impression  Pt is surprisingly sharp and generally mobile 80 y.o. Who was mod I PTA, using a Rollator inside of her home and using a cane for community access.  She still drives.  She presents with right upper and right lower extremity weakness requiring min assist overall to mobilize safely with a rollator.  She is not safe or strong enough to go back to mod I living and is interested in inpatient rehab to help get her back to mod I level.      PT Assessment  Patient needs continued PT services    Follow Up Recommendations  CIR    Does the patient have the potential to tolerate intense rehabilitation     Yes, if ramped up over 3 days and if sessions are spread out throughout the day, I believe she could tolerate it.    Barriers to Discharge Decreased caregiver support she does not have 24/7 assist at home, but I do believe that she will be able to get back to a mod I level after rehab.      Equipment Recommendations  None recommended by PT    Recommendations for Other Services Rehab consult   Frequency Min 4X/week    Precautions / Restrictions Precautions Precautions: Fall Precaution Comments: right hemiperesis   Pertinent Vitals/Pain See vitals flow sheet.       Mobility  Bed Mobility Overal bed mobility: Modified Independent General bed mobility  comments: with HOB and rails for support.  Transfers Overall transfer level: Needs assistance Equipment used: 4-wheeled walker Transfers: Sit to/from Stand Sit to Stand: Min assist General transfer comment: min assist to support trunk for balance during transitions and to help manage 4 wheeled RW with breaks (she reports hers is smaller and the breaks are easier to lock/unlock).   Ambulation/Gait Ambulation/Gait assistance: Min assist Ambulation Distance (Feet): 75 Feet Assistive device: 4-wheeled walker Gait Pattern/deviations: Step-through pattern;Decreased step length - right;Decreased dorsiflexion - right;Decreased weight shift to right;Steppage;Trunk flexed Gait velocity: decreased Gait velocity interpretation: <1.8 ft/sec, indicative of risk for recurrent falls General Gait Details: Pt with difficulty progressing right leg forward during gait.  Assist needed to steady pt at trunk for balance and to verbally cue her to be closer to the rollator.   Modified Rankin (Stroke Patients Only) Pre-Morbid Rankin Score: No significant disability Modified Rankin: Moderately severe disability        PT Diagnosis: Difficulty walking;Abnormality of gait;Generalized weakness;Hemiplegia dominant side  PT Problem List: Decreased strength;Decreased activity tolerance;Decreased balance;Decreased mobility;Decreased coordination;Decreased knowledge of use of DME;Impaired sensation;Decreased skin integrity PT Treatment Interventions: DME instruction;Gait training;Stair training;Functional mobility training;Therapeutic activities;Therapeutic exercise;Balance training;Neuromuscular re-education;Patient/family education     PT Goals(Current goals can be found in the care plan section) Acute Rehab PT Goals Patient Stated Goal: to get back to her normal level of independence.   PT Goal Formulation: With patient/family Time For Goal Achievement: 08/23/13 Potential to Achieve Goals: Good  Visit  Information  Last PT Received On: 08/09/13 Assistance Needed: +1 History of Present Illness: 80 y.o. female admitted to [facility] on 08/08/13 with history of atrial fibrillation on a baby aspirin once to twice a week, hypertension, polymyalgia rheumatica and, arthritis who presents to the ED with sudden onset of right-sided weakness and a sensation of imbalance.  Imaging confirms left frontal lobe infarcts. Infarcts felt to be embolic secondary to known atrial fibrillation.        Prior [place] expects to be discharged to:: Private residence Living Arrangements: Alone Available Help at Discharge: Family;Available PRN/intermittently Type of Home: House Home Access: Stairs to enter [employer] of Steps: 2 Entrance Stairs-Rails: Right (leaves cane outside for getting up and down the stairs) Home Layout: One level (has one step down into her living area) Home Equipment: Walker - 2 wheels;Walker - 4 wheels;Cane - quad;Cane - single point Prior Function Level of Independence: Independent with assistive device(s) Comments: per pt report, she used the rollator inside and the cane in the community and at church.  She still drives.   Communication Communication: HOH (hearing aids bil) Dominant Hand: Right    Cognition  Cognition Arousal/Alertness: Awake/alert Behavior During Therapy: WFL for tasks assessed/performed Overall Cognitive Status: Within Functional Limits for tasks assessed (A& O x 4)    Extremity/Trunk Assessment Upper Extremity Assessment Upper Extremity Assessment: Defer to OT evaluation Lower Extremity Assessment Lower Extremity Assessment: RLE deficits/detail RLE Deficits / Details: functionally, during gait the pt demonstrated decreased strength in her right leg decreased foot clearance and decreased stride length.  She did not buckle when putting weight on the leg and was aware of the deficit and was trying to compensate for it during her  gait with increased hip and knee flexion RLE Sensation: decreased light touch RLE Coordination: decreased gross motor;decreased fine motor (due to strength deficits) Cervical / Trunk Assessment Cervical / Trunk Assessment: Kyphotic   Balance Balance Overall balance assessment: Needs assistance Standing balance support: Bilateral upper extremity supported;Single extremity supported;During functional activity Standing balance-Leahy Scale: Poor Standing balance comment: min assist to maintain balance during functional activity of washing her hands at the sink.   General Comments General comments (skin integrity, edema, etc.): Pt with wound on her left lower leg that is covered with a bandage.  Per pt report she bruises easily and her skin is very fragile.    End of Session PT - End of Session Equipment Utilized During Treatment: Gait belt Activity Tolerance: Patient limited by fatigue Patient left: in chair;with call bell/phone within reach;with family/visitor present    Wells Guiles B. [place], [place], DPT [phone]   08/09/2013, 2:16 PM

## 2013-08-09 NOTE — Progress Notes (Signed)
Rehab Admissions Coordinator Note:  Patient was screened by Retta Diones for appropriateness for an Inpatient Acute Rehab Consult.  At this time, we are recommending Inpatient Rehab consult.  Jodell Cipro M 08/09/2013, 5:08 PM  I can be reached at [phone].

## 2013-08-09 NOTE — ED Provider Notes (Signed)
I saw and evaluated the patient, reviewed the resident's note and I agree with the findings and plan.      Houston Siren III, MD 08/09/13 [phone]

## 2013-08-10 ENCOUNTER — Encounter (HOSPITAL_COMMUNITY): Payer: Self-pay | Admitting: *Deleted

## 2013-08-10 ENCOUNTER — Inpatient Hospital Stay (HOSPITAL_COMMUNITY)
Admission: RE | Admit: 2013-08-10 | Discharge: 2013-08-19 | DRG: 945 | Disposition: A | Discharge status: Home Health | Payer: Medicare Other | Source: Intra-hospital | Attending: Physical Medicine & Rehabilitation | Admitting: Physical Medicine & Rehabilitation

## 2013-08-10 ENCOUNTER — Telehealth: Payer: Self-pay

## 2013-08-10 DIAGNOSIS — I4891 Unspecified atrial fibrillation: Secondary | ICD-10-CM

## 2013-08-10 DIAGNOSIS — I10 Essential (primary) hypertension: Secondary | ICD-10-CM

## 2013-08-10 DIAGNOSIS — S99929A Unspecified injury of unspecified foot, initial encounter: Secondary | ICD-10-CM

## 2013-08-10 DIAGNOSIS — X58XXXA Exposure to other specified factors, initial encounter: Secondary | ICD-10-CM | POA: Diagnosis present

## 2013-08-10 DIAGNOSIS — I639 Cerebral infarction, unspecified: Secondary | ICD-10-CM | POA: Diagnosis present

## 2013-08-10 DIAGNOSIS — Z5189 Encounter for other specified aftercare: Principal | ICD-10-CM

## 2013-08-10 DIAGNOSIS — I6340 Cerebral infarction due to embolism of unspecified cerebral artery: Secondary | ICD-10-CM | POA: Diagnosis present

## 2013-08-10 DIAGNOSIS — E785 Hyperlipidemia, unspecified: Secondary | ICD-10-CM | POA: Diagnosis present

## 2013-08-10 DIAGNOSIS — M353 Polymyalgia rheumatica: Secondary | ICD-10-CM | POA: Diagnosis present

## 2013-08-10 DIAGNOSIS — S99919A Unspecified injury of unspecified ankle, initial encounter: Secondary | ICD-10-CM

## 2013-08-10 DIAGNOSIS — Z7982 Long term (current) use of aspirin: Secondary | ICD-10-CM

## 2013-08-10 DIAGNOSIS — S8990XA Unspecified injury of unspecified lower leg, initial encounter: Secondary | ICD-10-CM | POA: Diagnosis present

## 2013-08-10 LAB — GLUCOSE, CAPILLARY: Glucose-Capillary: 82 mg/dL (ref 70–99)

## 2013-08-10 LAB — CREATININE, SERUM
Creatinine, Ser: 0.54 mg/dL (ref 0.50–1.10)
GFR calc Af Amer: >90 mL/min (ref >90)
GFR calc non Af Amer: 79 mL/min — ABNORMAL LOW (ref >90)

## 2013-08-10 LAB — CBC
HEMATOCRIT: 41.2 % (ref 36.0–46.0)
Hemoglobin: 13.9 g/dL (ref 12.0–15.0)
MCH: 33 pg (ref 26.0–34.0)
MCHC: 33.7 g/dL (ref 30.0–36.0)
MCV: 97.9 fL (ref 78.0–100.0)
Platelets: 208 10*3/uL (ref 150–400)
RBC: 4.21 MIL/uL (ref 3.87–5.11)
RDW: 14.4 % (ref 11.5–15.5)
WBC: 9 10*3/uL (ref 4.0–10.5)

## 2013-08-10 MED ORDER — METOPROLOL SUCCINATE ER 25 MG PO TB24
25.0000 mg | ORAL_TABLET | Freq: Every day | ORAL | Status: DC
  Administered 2013-08-10 – 2013-08-18 (×9): 25 mg via ORAL
  Filled 2013-08-10 (×11): qty 1

## 2013-08-10 MED ORDER — SIMVASTATIN 10 MG PO TABS: 10.0000 mg | ORAL_TABLET | ORAL | Status: DC

## 2013-08-10 MED ORDER — ASPIRIN 81 MG PO CHEW
81.0000 mg | CHEWABLE_TABLET | Freq: Every day | ORAL | Status: DC
  Administered 2013-08-11 – 2013-08-19 (×9): 81 mg via ORAL
  Filled 2013-08-10 (×9): qty 1

## 2013-08-10 MED ORDER — ENOXAPARIN SODIUM 40 MG/0.4ML ~~LOC~~ SOLN
40.0000 mg | SUBCUTANEOUS | Status: DC
  Administered 2013-08-10 – 2013-08-18 (×9): 40 mg via SUBCUTANEOUS
  Filled 2013-08-10 (×10): qty 0.4

## 2013-08-10 MED ORDER — PREDNISONE 5 MG PO TABS
5.0000 mg | ORAL_TABLET | Freq: Every day | ORAL | Status: DC
  Administered 2013-08-11 – 2013-08-19 (×9): 5 mg via ORAL
  Filled 2013-08-10 (×10): qty 1

## 2013-08-10 MED ORDER — ENOXAPARIN SODIUM 40 MG/0.4ML ~~LOC~~ SOLN
40.0000 mg | SUBCUTANEOUS | Status: DC
  Filled 2013-08-10 (×2): qty 0.4

## 2013-08-10 MED ORDER — ACETAMINOPHEN 325 MG PO TABS
650.0000 mg | ORAL_TABLET | Freq: Four times a day (QID) | ORAL | Status: DC | PRN
  Administered 2013-08-11: 650 mg via ORAL
  Filled 2013-08-10: qty 2

## 2013-08-10 MED ORDER — SIMVASTATIN 10 MG PO TABS
10.0000 mg | ORAL_TABLET | ORAL | Status: DC
  Administered 2013-08-11 – 2013-08-17 (×4): 10 mg via ORAL
  Filled 2013-08-10 (×5): qty 1

## 2013-08-10 MED ORDER — METOPROLOL SUCCINATE ER 50 MG PO TB24
50.0000 mg | ORAL_TABLET | Freq: Every day | ORAL | Status: DC
  Administered 2013-08-11 – 2013-08-19 (×9): 50 mg via ORAL
  Filled 2013-08-10 (×10): qty 1

## 2013-08-10 MED ORDER — SENNOSIDES-DOCUSATE SODIUM 8.6-50 MG PO TABS: 1.0000 | ORAL_TABLET | Freq: Every evening | ORAL | Status: DC | PRN

## 2013-08-10 MED ORDER — ONDANSETRON HCL 4 MG PO TABS: 4.0000 mg | ORAL_TABLET | Freq: Four times a day (QID) | ORAL | Status: DC | PRN

## 2013-08-10 MED ORDER — ONDANSETRON HCL 4 MG/2ML IJ SOLN
4.0000 mg | Freq: Four times a day (QID) | INTRAMUSCULAR | Status: DC | PRN
Start: 2013-08-10 — End: 2013-08-19

## 2013-08-10 MED ORDER — SORBITOL 70 % SOLN
30.0000 mL | Freq: Every day | Status: DC | PRN
Start: 2013-08-10 — End: 2013-08-19

## 2013-08-10 MED ORDER — METOPROLOL SUCCINATE ER 25 MG PO TB24
25.0000 mg | ORAL_TABLET | Freq: Every day | ORAL | Status: DC
  Filled 2013-08-10: qty 1

## 2013-08-10 NOTE — Telephone Encounter (Signed)
Patient was admittted to [facility] hospital with a stroke.   She is to return her for stitch removal.  Daughter wants to know what to do?   [phone]

## 2013-08-10 NOTE — Care Management Note (Signed)
    Page 1 of 1   08/10/2013     2:30:30 PM   CARE MANAGEMENT NOTE 08/10/2013  Patient:  Krystal Hensley, Krystal Hensley   Account Number:  [id number]  Date Initiated:  08/10/2013  Documentation initiated by:  GRAVES-BIGELOW,Darril Patriarca  Subjective/Objective Assessment:   Pt admittd with right arm weakness. Imaging confirms left frontal lobe infarcts. Infarcts felt to be embolic secondary to known atrial fibrillation.     Action/Plan:   Plan for d/c to CIR today. No further needs from CM at this time.   Anticipated DC Date:  08/10/2013   Anticipated DC Plan:  IP REHAB FACILITY      DC Planning Services  CM consult      Choice offered to / List presented to:             Status of service:  Completed, signed off Medicare Important Message given?   (If response is "NO", the following Medicare IM given date fields will be blank) Date Medicare IM given:   Date Additional Medicare IM given:    Discharge Disposition:  IP REHAB FACILITY  Per UR Regulation:  Reviewed for med. necessity/level of care/duration of stay  If discussed at [place] of Stay Meetings, dates discussed:    Comments:

## 2013-08-10 NOTE — Progress Notes (Signed)
Physical Therapy Treatment Patient Details Name: Krystal Hensley MRN: [id number] DOB: May 29, 1918 Today's Date: 08/10/2013 Time: 4401-0272 PT Time Calculation (min): 51 min  PT Assessment / Plan / Recommendation  History of Present Illness 80 y.o. female admitted to [facility] on 08/08/13 with history of atrial fibrillation on a baby aspirin once to twice a week, hypertension, polymyalgia rheumatica and, arthritis who presents to the ED with sudden onset of right-sided weakness and a sensation of imbalance.  Imaging confirms left frontal lobe infarcts. Infarcts felt to be embolic secondary to known atrial fibrillation.    PT Comments   Pt is progressing well with increased gait distance today.  Still needing min assist due to unsteadiness, but less right foot drag and right leg lag during gait today compared to yesterday.  Pt is working diligently on her right upper extremity to try to get it stronger and always wants to try to do something before letting someone help her.  I believe she continues to be an excellent inpatient rehab candidate and that she can get to a mod I level only requiring intermittent supervision at discharge.    Follow Up Recommendations  CIR     Does the patient have the potential to tolerate intense rehabilitation    Yes, she demonstrated that today with almost an hour session with me.    Barriers to Discharge   None      Equipment Recommendations  None recommended by PT    Recommendations for Other Services   None  Frequency Min 4X/week   Progress towards PT Goals Progress towards PT goals: Progressing toward goals  Plan Current plan remains appropriate    Precautions / Restrictions Precautions Precautions: Fall Precaution Comments: right hemiperesis   Pertinent Vitals/Pain See vitals flow sheet.    Mobility  Bed Mobility Overal bed mobility: Needs Assistance Bed Mobility: Supine to Sit;Sit to Supine Supine to sit: Modified independent (Device/Increase  time) Sit to supine: Min assist General bed mobility comments: HOB raised to 45 degrees and using railing to pull up.  Min assist of right leg to get back into the bed.   Transfers Overall transfer level: Needs assistance Equipment used: 4-wheeled walker Transfers: Sit to/from Stand Sit to Stand: Min assist General transfer comment: Min assist from lower toilet seat and lower recliner chair to get to standing.  Support needed at trunk to stabilize for balance and verbal cues for safe hand placment and safety with rollator breaks during transitions.   Ambulation/Gait Ambulation/Gait assistance: Min assist Ambulation Distance (Feet): 200 Feet Assistive device: 4-wheeled walker Gait Pattern/deviations: Step-through pattern;Decreased dorsiflexion - right;Decreased step length - right;Decreased stance time - right;Shuffle General Gait Details: Pt at times (after shoes donned) was kicking right side of RW.  Better foot and leg progression on this side today, but still signs of fatigue at end of gait.  Pt also more unsteady when multi tasking walking and talking, walking and turning, walking and stopping to talk (taking one hand off of the rollator).      Exercises General Exercises - Upper Extremity Shoulder Flexion: AAROM;Both;10 reps Elbow Flexion: AROM;Both;10 reps;Seated General Exercises - Lower Extremity Long Arc Quad: AROM;Both;10 reps;Seated (right leg with signs of weakness vs L leg) Hip ABduction/ADduction: AROM;Both;10 reps;Seated (adduct against pillow for resistance) Hip Flexion/Marching: AROM;Both;10 reps;Seated (right leg with signs of weakness vs L leg) Toe Raises: AROM;Both;10 reps;Seated Heel Raises: AROM;Both;10 reps;Seated     PT Goals (current goals can now be found in the care plan section)  Acute Rehab PT Goals Patient Stated Goal: to get back to her normal level of independence.    Visit Information  Last PT Received On: 08/10/13 Assistance Needed: +1 History of  Present Illness: 80 y.o. female admitted to [facility] on 08/08/13 with history of atrial fibrillation on a baby aspirin once to twice a week, hypertension, polymyalgia rheumatica and, arthritis who presents to the ED with sudden onset of right-sided weakness and a sensation of imbalance.  Imaging confirms left frontal lobe infarcts. Infarcts felt to be embolic secondary to known atrial fibrillation.     Subjective Data  Subjective: Pt is excited about going to rehab to get to where she can be independent again.   Patient Stated Goal: to get back to her normal level of independence.     Cognition  Cognition Arousal/Alertness: Awake/alert Behavior During Therapy: WFL for tasks assessed/performed Overall Cognitive Status: Within Functional Limits for tasks assessed    Balance  Balance Overall balance assessment: Needs assistance Sitting-balance support: Feet supported Sitting balance-Leahy Scale: Good Sitting balance - Comments: Pt attempting to donn socks sitting EOB.  Min assist to prevent LOB anteriorly while attempting to put on socks.  She was able to successfully donn her right, but not her left sock sitting EOB.   Standing balance support: Bilateral upper extremity supported;Single extremity supported Standing balance-Leahy Scale: Fair  End of Session PT - End of Session Equipment Utilized During Treatment: Gait belt;Back brace (pt has a lumbar corset she likes to use) Activity Tolerance: Patient limited by fatigue Patient left: in bed;with call bell/phone within reach;with family/visitor present     Wells Guiles B. [place], [place], DPT [phone]   08/10/2013, 2:47 PM

## 2013-08-10 NOTE — Discharge Instructions (Addendum)
Ischemic Stroke A stroke (cerebrovascular accident) is the sudden death of brain tissue. It is a medical emergency. A stroke can cause permanent loss of brain function. This can cause problems with different parts of your body. A transient ischemic attack (TIA) is different because it does not cause permanent damage. A TIA is a short-lived problem of poor blood flow affecting a part of the brain. A TIA is also a serious problem because having a TIA greatly increases the chances of having a stroke. When symptoms first develop, you cannot know if the problem might be a stroke or TIA. CAUSES  A stroke is caused by a decrease of oxygen supply to an area of your brain. It is usually the result of a small blood clot or collection of cholesterol or fat (plaque) that blocks blood flow in the brain. A stroke can also be caused by blocked or damaged carotid arteries.  RISK FACTORS  High blood pressure (hypertension).  High cholesterol.  Diabetes mellitus.  Heart disease.  The build up of plaque in the blood vessels (peripheral artery disease or atherosclerosis).  The build up of plaque in the blood vessels providing blood and oxygen to the brain (carotid artery stenosis).  An abnormal heart rhythm (atrial fibrillation).  Obesity.  Smoking.  Taking oral contraceptives (especially in combination with smoking).  Physical inactivity.  A diet high in fats, salt (sodium), and calories.  Alcohol use.  Use of illegal drugs (especially cocaine and methamphetamine).  Being African American.  Being over the age of 73.  Family history of stroke.  Previous history of blood clots, stroke, TIA, or heart attack.  Sickle cell disease. SYMPTOMS  These symptoms usually develop suddenly, or may be newly present upon awakening from sleep:  Sudden weakness or numbness of the face, arm, or leg, especially on one side of the body.  Sudden trouble walking or difficulty moving arms or legs.  Sudden  confusion.  Sudden personality changes.  Trouble speaking (aphasia) or understanding.  Difficulty swallowing.  Sudden trouble seeing in one or both eyes.  Double vision.  Dizziness.  Loss of balance or coordination.  Sudden severe headache with no known cause.  Trouble reading or writing. DIAGNOSIS  Your caregiver can often determine the presence or absence of a stroke based on your symptoms, history, and physical exam. Computed tomography (CT) of the brain is usually performed to confirm the stroke, determine causes, and determine stroke severity. Other tests may be done to find the cause of the stroke. These tests may include:  Electrocardiography.  Continuous heart monitoring.  Echocardiography.  Carotid ultrasonography.  Magnetic resonance imaging (MRI).  A scan of the brain circulation.  Blood tests. PREVENTION  The risk of a stroke can be decreased by appropriately treating high blood pressure, high cholesterol, diabetes, heart disease, and obesity and by quitting smoking, limiting alcohol, and staying physically active. TREATMENT  Time is of the essence. It is important to seek treatment within 3 4 hours of the start of symptoms because you may receive a medicine to dissolve the clot (thrombolytic) that cannot be given after that time. Even if you do not know when your symptoms began, get treatment as soon as possible. After the 4 hour window has passed, treatment may include rest, oxygen, intravenous (IV) fluids, and medicines to thin the blood (anticoagulants). Treatment of stroke depends on the duration, severity, and cause of your symptoms. Medicines and diet may be used to address diabetes, high blood pressure, and other risk  factors. Physical, speech, and occupational therapists will assess you and work to improve any functions impaired by the stroke. Measures will be taken to prevent short-term and long-term complications, including infection from breathing  foreign material into the lungs (aspiration pneumonia), blood clots in the legs, bedsores, and falls. Rarely, surgery may be needed to remove large blood clots or to open up blocked arteries. HOME CARE INSTRUCTIONS   Take all medicines prescribed by your caregiver. Follow the directions carefully. Medicines may be used to control risk factors for a stroke. Be sure you understand all your medicine instructions.  You may be told to take aspirin or the anticoagulant warfarin. Warfarin needs to be taken exactly as instructed.  Too much and too little warfarin are both dangerous. Too much warfarin increases the risk of bleeding. Too little warfarin continues to allow the risk for blood clots. While taking warfarin, you will need to have regular blood tests to measure your blood clotting time. These blood tests usually include both the PT and INR tests. The PT and INR results allow your caregiver to adjust your dose of warfarin. The dose can change for many reasons. It is critically important that you take warfarin exactly as prescribed, and that you have your PT and INR levels drawn exactly as directed.  Many foods, especially foods high in vitamin K can interfere with warfarin and affect the PT and INR results. Foods high in vitamin K include spinach, kale, broccoli, cabbage, collard and turnip greens, brussels sprouts, peas, cauliflower, seaweed, and parsley as well as beef and pork liver, green tea, and soybean oil. You should eat a consistent amount of foods high in vitamin K. Avoid major changes in your diet, or notify your caregiver before changing your diet. Arrange a visit with a dietitian to answer your questions.  Many medicines can interfere with warfarin and affect the PT and INR results. You must tell your caregiver about any and all medicines you take, this includes all vitamins and supplements. Be especially cautious with aspirin and anti-inflammatory medicines. Do not take or discontinue any  prescribed or over-the-counter medicine except on the advice of your caregiver or pharmacist.  Warfarin can have side effects, such as excessive bruising or bleeding. You will need to hold pressure over cuts for longer than usual. Your caregiver or pharmacist will discuss other potential side effects.  Avoid sports or activities that may cause injury or bleeding.  Be mindful when shaving, flossing your teeth, or handling sharp objects.  Alcohol can change the body's ability to handle warfarin. It is best to avoid alcoholic drinks or consume only very small amounts while taking warfarin. Notify your caregiver if you change your alcohol intake.  Notify your dentist or other caregivers before procedures.  If swallow studies have determined that your swallowing reflex is present, you should eat healthy foods. A diet that includes 5 or more servings of fruits and vegetables a day may reduce the risk of stroke. Foods may need to be a special consistency (soft or pureed), or small bites may need to be taken in order to avoid aspirating or choking. Certain diets may be prescribed to address high blood pressure, high cholesterol, diabetes, or obesity.  A low-sodium, low-saturated fat, low-trans fat, low-cholesterol diet is recommended to manage high blood pressure.  A low-saturated fat, low-trans fat, low-cholesterol, and high-fiber diet may control cholesterol levels.  A controlled-carbohydrate, controlled-sugar diet is recommended to manage diabetes.  A reduced-calorie, low-sodium, low-saturated fat, low-trans fat, low-cholesterol diet  is recommended to manage obesity.  Maintain a healthy weight.  Stay physically active. It is recommended that you get at least 30 minutes of activity on most or all days.  Do not smoke.  Limit alcohol use even if you are not taking warfarin. Moderate alcohol use is considered to be:  No more than 2 drinks each day for men.  No more than 1 drink each day for  nonpregnant women.  Stop drug abuse.  Home safety. A safe home environment is important to reduce the risk of falls. Your caregiver may arrange for specialists to evaluate your home. Having grab bars in the bedroom and bathroom is often important. Your caregiver may arrange for equipment to be used at home, such as raised toilets and a seat for the shower.  Physical, occupational, and speech therapy. Ongoing therapy may be needed to maximize your recovery after a stroke. If you have been advised to use a walker or a cane, use it at all times. Be sure to keep your therapy appointments.  Follow all instructions for follow-up with your caregiver. This is very important. This includes any referrals, physical therapy, rehabilitation, and lab tests. Proper follow up can prevent another stroke from occurring. SEEK MEDICAL CARE IF:  You have personality changes.  You have difficulty swallowing.  You are seeing double.  You have dizziness.  You have a fever.  You have skin breakdown. SEEK IMMEDIATE MEDICAL CARE IF:  Any of these symptoms may represent a serious problem that is an emergency. Do not wait to see if the symptoms will go away. Get medical help right away. Call your local emergency services (911 in [place] Do not drive yourself to the hospital.  You have sudden weakness or numbness of the face, arm, or leg, especially on one side of the body.  You have sudden trouble walking or difficulty moving arms or legs.  You have sudden confusion.  You have trouble speaking (aphasia) or understanding.  You have sudden trouble seeing in one or both eyes.  You have a loss of balance or coordination.  You have a sudden, severe headache with no known cause.  You have new chest pain or an irregular heartbeat.  You have a partial or total loss of consciousness.   Document Released: 06/23/2005 Document Revised: 02/23/2013 Document Reviewed: 02/01/2012 [facility] Patient Information 2014  [place].   Stroke Prevention Some medical conditions and behaviors are associated with an increased chance of having a stroke. You may prevent a stroke by making healthy choices and managing medical conditions. HOW CAN I REDUCE MY RISK OF HAVING A STROKE?   Stay physically active. Get at least 30 minutes of activity on most or all days.  Do not smoke. It may also be helpful to avoid exposure to secondhand smoke.  Limit alcohol use. Moderate alcohol use is considered to be:  No more than 2 drinks per day for men.  No more than 1 drink per day for nonpregnant women.  Eat healthy foods. This involves  Eating 5 or more servings of fruits and vegetables a day.  Following a diet that addresses high blood pressure (hypertension), high cholesterol, diabetes, or obesity.  Manage your cholesterol levels.  A diet low in saturated fat, trans fat, and cholesterol and high in fiber may control cholesterol levels.  Take any prescribed medicines to control cholesterol as directed by your health care provider.  Manage your diabetes.  A controlled-carbohydrate, controlled-sugar diet is recommended to manage diabetes.  Take any prescribed medicines to control diabetes as directed by your health care provider.  Control your hypertension.  A low-salt (sodium), low-saturated fat, low-trans fat, and low-cholesterol diet is recommended to manage hypertension.  Take any prescribed medicines to control hypertension as directed by your health care provider.  Maintain a healthy weight.  A reduced-calorie, low-sodium, low-saturated fat, low-trans fat, low-cholesterol diet is recommended to manage weight.  Stop drug abuse.  Avoid taking birth control pills.  Talk to your health care provider about the risks of taking birth control pills if you are over 45 years old, smoke, get migraines, or have ever had a blood clot.  Get evaluated for sleep disorders (sleep apnea).  Talk to your health  care provider about getting a sleep evaluation if you snore a lot or have excessive sleepiness.  Take medicines as directed by your health care provider.  For some people, aspirin or blood thinners (anticoagulants) are helpful in reducing the risk of forming abnormal blood clots that can lead to stroke. If you have the irregular heart rhythm of atrial fibrillation, you should be on a blood thinner unless there is a good reason you cannot take them.  Understand all your medicine instructions.  Make sure that other other conditions (such as anemia or atherosclerosis) are addressed. SEEK IMMEDIATE MEDICAL CARE IF:   You have sudden weakness or numbness of the face, arm, or leg, especially on one side of the body.  Your face or eyelid droops to one side.  You have sudden confusion.  You have trouble speaking (aphasia) or understanding.  You have sudden trouble seeing in one or both eyes.  You have sudden trouble walking.  You have dizziness.  You have a loss of balance or coordination.  You have a sudden, severe headache with no known cause.  You have new chest pain or an irregular heartbeat. Any of these symptoms may represent a serious problem that is an emergency. Do not wait to see if the symptoms will go away. Get medical help at once. Call your local emergency services  (911 in [place] Do not drive yourself to the hospital. Document Released: 07/31/2004 Document Revised: 04/13/2013 Document Reviewed: 12/24/2012 [facility] Patient Information 2014 [place].  Cardiac Diet This diet can help prevent heart disease and stroke. Many factors influence your heart health, including eating and exercise habits. Coronary risk rises a lot with abnormal blood fat (lipid) levels. Cardiac meal planning includes limiting unhealthy fats, increasing healthy fats, and making other small dietary changes. General guidelines are as follows:  Adjust calorie intake to reach and maintain desirable  body weight.  Limit total fat intake to less than 30% of total calories. Saturated fat should be less than 7% of calories.  Saturated fats are found in animal products and in some vegetable products. Saturated vegetable fats are found in coconut oil, cocoa butter, palm oil, and palm kernel oil. Read labels carefully to avoid these products as much as possible. Use butter in moderation. Choose tub margarines and oils that have 2 grams of fat or less. Good cooking oils are canola and olive oils.  Practice low-fat cooking techniques. Do not fry food. Instead, broil, bake, boil, steam, grill, roast on a rack, stir-fry, or microwave it. Other fat reducing suggestions include:  Remove the skin from poultry.  Remove all visible fat from meats.  Skim the fat off stews, soups, and gravies before serving them.  Steam vegetables in water or broth instead of sauting them in  fat.  Avoid foods with trans fat (or hydrogenated oils), such as commercially fried foods and commercially baked goods. Commercial shortening and deep-frying fats will contain trans fat.  Increase intake of fruits, vegetables, whole grains, and legumes to replace foods high in fat.  Increase consumption of nuts, legumes, and seeds to at least 4 servings weekly. One serving of a legume equals  cup, and 1 serving of nuts or seeds equals  cup.  Choose whole grains more often. Have 3 servings per day (a serving is 1 ounce [oz]).  Eat 4 to 5 servings of vegetables per day. A serving of vegetables is 1 cup of raw leafy vegetables;  cup of raw or cooked cut-up vegetables;  cup of vegetable juice.  Eat 4 to 5 servings of fruit per day. A serving of fruit is 1 medium whole fruit;  cup of dried fruit;  cup of fresh, frozen, or canned fruit;  cup of 100% fruit juice.  Increase your intake of dietary fiber to 20 to 30 grams per day. Insoluble fiber may help lower your risk of heart disease and may help curb your appetite.  Soluble  fiber binds cholesterol to be removed from the blood. Foods high in soluble fiber are dried beans, citrus fruits, oats, apples, bananas, broccoli, Brussels sprouts, and eggplant.  Try to include foods fortified with plant sterols or stanols, such as yogurt, breads, juices, or margarines. Choose several fortified foods to achieve a daily intake of 2 to 3 grams of plant sterols or stanols.  Foods with omega-3 fats can help reduce your risk of heart disease. Aim to have a 3.5 oz portion of fatty fish twice per week, such as salmon, mackerel, albacore tuna, sardines, lake trout, or herring. If you wish to take a fish oil supplement, choose one that contains 1 gram of both DHA and EPA.  Limit processed meats to 2 servings (3 oz portion) weekly.  Limit the sodium in your diet to 1500 milligrams (mg) per day. If you have high blood pressure, talk to a registered dietitian about a DASH (Dietary Approaches to Stop Hypertension) eating plan.  Limit sweets and beverages with added sugar, such as soda, to no more than 5 servings per week. One serving is:   1 tablespoon sugar.  1 tablespoon jelly or jam.   cup sorbet.  1 cup lemonade.   cup regular soda. CHOOSING FOODS Starches  Allowed: Breads: All kinds (wheat, rye, raisin, white, oatmeal, [place], [place], and English muffin bread). Low-fat rolls: English muffins, frankfurter and hamburger buns, bagels, pita bread, tortillas (not fried). Pancakes, waffles, biscuits, and muffins made with recommended oil.  Avoid: Products made with saturated or trans fats, oils, or whole milk products. Butter rolls, cheese breads, croissants. Commercial doughnuts, muffins, sweet rolls, biscuits, waffles, pancakes, store-bought mixes. Crackers  Allowed: Low-fat crackers and snacks: Animal, graham, rye, saltine (with recommended oil, no lard), oyster, and matzo crackers. Bread sticks, melba toast, rusks, flatbread, pretzels, and light popcorn.  Avoid: High-fat  crackers: cheese crackers, butter crackers, and those made with coconut, palm oil, or trans fat (hydrogenated oils). Buttered popcorn. Cereals  Allowed: Hot or cold whole-grain cereals.  Avoid: Cereals containing coconut, hydrogenated vegetable fat, or animal fat. Potatoes / Pasta / Rice  Allowed: All kinds of potatoes, rice, and pasta (such as macaroni, spaghetti, and noodles).  Avoid: Pasta or rice prepared with cream sauce or high-fat cheese. Chow mein noodles, [place] fries. Vegetables  Allowed: All vegetables and vegetable juices.  Avoid:  Fried vegetables. Vegetables in cream, butter, or high-fat cheese sauces. Limit coconut. Fruit in cream or custard. Protein  Allowed: Limit your intake of meat, seafood, and poultry to no more than 6 oz (cooked weight) per day. All lean, well-trimmed beef, veal, pork, and lamb. All chicken and [place] without skin. All fish and shellfish. Wild game: wild duck, rabbit, pheasant, and venison. Egg whites or low-cholesterol egg substitutes may be used as desired. Meatless dishes: recipes with dried beans, peas, lentils, and tofu (soybean curd). Seeds and nuts: all seeds and most nuts.  Avoid: Prime grade and other heavily marbled and fatty meats, such as short ribs, spare ribs, rib eye roast or steak, frankfurters, sausage, bacon, and high-fat luncheon meats, mutton. Caviar. Commercially fried fish. Domestic duck, goose, venison sausage. Organ meats: liver, gizzard, heart, chitterlings, brains, kidney, sweetbreads. Dairy  Allowed: Low-fat cheeses: nonfat or low-fat cottage cheese (1% or 2% fat), cheeses made with part skim milk, such as mozzarella, farmers, string, or ricotta. (Cheeses should be labeled no more than 2 to 6 grams fat per oz.). Skim (or 1%) milk: liquid, powdered, or evaporated. Buttermilk made with low-fat milk. Drinks made with skim or low-fat milk or cocoa. Chocolate milk or cocoa made with skim or low-fat (1%) milk. Nonfat or low-fat  yogurt.  Avoid: Whole milk cheeses, including colby, cheddar, [place], Monterey Jack, [place], [place], [place], American, Swiss, and blue. Creamed cottage cheese, cream cheese. Whole milk and whole milk products, including buttermilk or yogurt made from whole milk, drinks made from whole milk. Condensed milk, evaporated whole milk, and 2% milk. Soups and Combination Foods  Allowed: Low-fat low-sodium soups: broth, dehydrated soups, homemade broth, soups with the fat removed, homemade cream soups made with skim or low-fat milk. Low-fat spaghetti, lasagna, chili, and Spanish rice if low-fat ingredients and low-fat cooking techniques are used.  Avoid: Cream soups made with whole milk, cream, or high-fat cheese. All other soups. Desserts and Sweets  Allowed: Sherbet, fruit ices, gelatins, meringues, and angel food cake. Homemade desserts with recommended fats, oils, and milk products. Jam, jelly, honey, marmalade, sugars, and syrups. Pure sugar candy, such as gum drops, hard candy, jelly beans, marshmallows, mints, and small amounts of dark chocolate.  Avoid: Commercially prepared cakes, pies, cookies, frosting, pudding, or mixes for these products. Desserts containing whole milk products, chocolate, coconut, lard, palm oil, or palm kernel oil. Ice cream or ice cream drinks. Candy that contains chocolate, coconut, butter, hydrogenated fat, or unknown ingredients. Buttered syrups. Fats and Oils  Allowed: Vegetable oils: safflower, sunflower, corn, soybean, cottonseed, sesame, canola, olive, or peanut. Non-hydrogenated margarines. Salad dressing or mayonnaise: homemade or commercial, made with a recommended oil. Low or nonfat salad dressing or mayonnaise.  Limit added fats and oils to 6 to 8 tsp per day (includes fats used in cooking, baking, salads, and spreads on bread). Remember to count the "hidden fats" in foods.  Avoid: Solid fats and shortenings: butter, lard, salt pork, bacon drippings. Gravy  containing meat fat, shortening, or suet. Cocoa butter, coconut. Coconut oil, palm oil, palm kernel oil, or hydrogenated oils: these ingredients are often used in bakery products, nondairy creamers, whipped toppings, candy, and commercially fried foods. Read labels carefully. Salad dressings made of unknown oils, sour cream, or cheese, such as blue cheese and Roquefort. Cream, all kinds: half-and-half, light, heavy, or whipping. Sour cream or cream cheese (even if "light" or low-fat). Nondairy cream substitutes: coffee creamers and sour cream substitutes made with palm, palm kernel, hydrogenated oils,  or coconut oil. Beverages  Allowed: Coffee (regular or decaffeinated), tea. Diet carbonated beverages, mineral water. Alcohol: Check with your caregiver. Moderation is recommended.  Avoid: Whole milk, regular sodas, and juice drinks with added sugar. Condiments  Allowed: All seasonings and condiments. Cocoa powder. "Cream" sauces made with recommended ingredients.  Avoid: Carob powder made with hydrogenated fats. SAMPLE MENU Breakfast   cup orange juice   cup oatmeal  1 slice toast  1 tsp margarine  1 cup skim milk Lunch  [place] sandwich with 2 oz [place], 2 slices bread  Lettuce and tomato slices  Fresh fruit  Carrot sticks  Coffee or tea Snack  Fresh fruit or low-fat crackers Dinner  3 oz lean ground beef  1 baked potato  1 tsp margarine   cup asparagus  Lettuce salad  1 tbs non-creamy dressing   cup peach slices  1 cup skim milk Document Released: 04/01/2008 Document Revised: 12/23/2011 Document Reviewed: 09/16/2011 [employer] Patient Information 2014 [place], [place].  STROKE/TIA DISCHARGE INSTRUCTIONS SMOKING Cigarette smoking nearly doubles your risk of having a stroke & is the single most alterable risk factor  If you smoke or have smoked in the last 12 months, you are advised to quit smoking for your health.  Most of the excess cardiovascular risk  related to smoking disappears within a year of stopping.  Ask you doctor about anti-smoking medications  [facility] Quit Line: 1-800-QUIT NOW  Free Smoking Cessation Classes [phone]  CHOLESTEROL Know your levels; limit fat & cholesterol in your diet  Lipid Panel     Component Value Date/Time   CHOL 216* 08/09/2013 0326   TRIG 116 08/09/2013 0326   HDL 73 08/09/2013 0326   CHOLHDL 3.0 08/09/2013 0326   VLDL 23 08/09/2013 0326   LDLCALC 120* 08/09/2013 0326      Many patients benefit from treatment even if their cholesterol is at goal.  Goal: Total Cholesterol (CHOL) less than 160  Goal:  Triglycerides (TRIG) less than 150  Goal:  HDL greater than 40  Goal:  LDL (LDLCALC) less than 100   BLOOD PRESSURE American Stroke Association blood pressure target is less that 120/80 mm/Hg  Your discharge blood pressure is:  BP: 154/76 mmHg  Monitor your blood pressure  Limit your salt and alcohol intake  Many individuals will require more than one medication for high blood pressure  DIABETES (A1c is a blood sugar average for last 3 months) Goal HGBA1c is under 7% (HBGA1c is blood sugar average for last 3 months)  Diabetes: No known diagnosis of diabetes    Lab Results  Component Value Date   HGBA1C 5.9* 08/09/2013     Your HGBA1c can be lowered with medications, healthy diet, and exercise.  Check your blood sugar as directed by your physician  Call your physician if you experience unexplained or low blood sugars.  PHYSICAL ACTIVITY/REHABILITATION Goal is 30 minutes at least 4 days per week  Activity: Increase activity slowly, Therapies: INPATIENT REHAB Return to work:   Activity decreases your risk of heart attack and stroke and makes your heart stronger.  It helps control your weight and blood pressure; helps you relax and can improve your mood.  Participate in a regular exercise program.  Talk with your doctor about the best form of exercise for you (dancing, walking, swimming,  cycling).  DIET/WEIGHT Goal is to maintain a healthy weight  Your discharge diet is: Cardiac REGULAR liquids Your height is:  Height: 5' (152.4 cm) Your current weight  is: Weight: 53.071 kg (117 lb) Your Body Mass Index (BMI) is:  BMI (Calculated): 22.9  Following the type of diet specifically designed for you will help prevent another stroke.  Your goal weight range is:  98-123 YOUR CURRENT WEIGHT IS GOOD AND WITHIN RANGE  Your goal Body Mass Index (BMI) is 19-24.  Healthy food habits can help reduce 3 risk factors for stroke:  High cholesterol, hypertension, and excess weight.  RESOURCES Stroke/Support Group:  Call [phone]   STROKE EDUCATION PROVIDED/REVIEWED AND GIVEN TO PATIENT Stroke warning signs and symptoms How to activate emergency medical system (call 911). Medications prescribed at discharge. Need for follow-up after discharge. Personal risk factors for stroke. Pneumonia vaccine given: No Flu vaccine given: No My questions have been answered, the writing is legible, and I understand these instructions.  I will adhere to these goals & educational materials that have been provided to me after my discharge from the hospital.

## 2013-08-10 NOTE — PMR Pre-admission (Signed)
PMR Admission Coordinator Pre-Admission Assessment  Patient: Krystal Hensley is an 80 y.o., female MRN: [id number] DOB: [birth year] Height: 5' (152.4 cm) Weight: 53.071 kg (117 lb)              Insurance Information HMO: No    PPO:       PCP:       IPA:       80/20:       OTHER:   PRIMARY: Medicare A/B      Policy#: [id number]      Subscriber: Duard Brady CM Name:        Phone#:       Fax#:   Pre-Cert#:        Employer: Retired Benefits:  Phone #:       Name: Economist. Date: 04/06/85     Deduct: $1260      Out of Pocket Max: none      Life Max: unlimited CIR: 100%      SNF: 100 days Outpatient: 80%     Co-Pay: 20% Home Health: 100%      Co-Pay: none DME: 80%     Co-Pay: 20% Providers: patient's choice  SECONDARY: [employer] [place]      Policy#: [id number]      Subscriber: Duard Brady CM Name:        Phone#:       Fax#:   Pre-Cert#:        Employer: Retired Benefits:  Phone #: [phone]     Name:   Eff. 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 right-handed female with history of atrial fibrillation maintained on aspirin therapy, hypertension, polymyalgia rheumatica. Patient was modified independent prior to admission using a rolling walker inside the home and a cane for community access and still driving. Admitted 08/08/2013 with acute onset of right-sided weakness. MRI of the brain showed acute small infarcts scattered throughout the left frontal parietal lobe as well as remote tiny right  cerebellar infarct. MRA of the head with intracranial atherosclerotic type changes. Carotid Dopplers with no ICA stenosis. Echocardiogram with ejection fraction 97% grade 1 diastolic dysfunction. Patient did not receive TPA. Neurology services consulted presently maintained on aspirin 81 mg daily as well as subcutaneous Lovenox for DVT prophylaxis. Patient is tolerating a regular diet. Physical therapy evaluation completed 08/09/2013 with recommendations for physical medicine rehabilitation consult to consider inpatient rehabilitation services.  Pt is motivated to do "more therapy".     Total: 2=NIH  Past Medical History  Past Medical History  Diagnosis Date  . Hypertension   . Arthritis   . Atrial fibrillation   . Back pain   . Cancer     colon  . Polymyalgia rheumatica   . Hypoglycemia   . HTN (  hypertension) 08/08/2013  . A-fib 08/08/2013    Family History  family history is not on file.  Prior Rehab/Hospitalizations:  Had [facility] therapies in Spring '14 after falls.   Current Medications  Current facility-administered medications:0.9 %  sodium chloride infusion, , Intravenous, Continuous, Eugenie Filler, MD, Last Rate: 75 mL/hr at 08/10/13 1306;  acetaminophen (TYLENOL) tablet 650 mg, 650 mg, Oral, Q6H PRN, Eugenie Filler, MD, 325 mg at 08/09/13 1436;  aspirin chewable tablet 81 mg, 81 mg, Oral, Daily, Eugenie Filler, MD, 81 mg at 08/10/13 1027 enoxaparin (LOVENOX) injection 40 mg, 40 mg, Subcutaneous, Q24H, Eugenie Filler, MD, 40 mg at 08/09/13 1801;  metoprolol succinate (TOPROL-XL) 24 hr tablet 25 mg, 25 mg, Oral, QHS, Belkys A Regalado, MD;  metoprolol succinate (TOPROL-XL) 24 hr tablet 50 mg, 50 mg, Oral, Daily, Eugenie Filler, MD, 50 mg at 08/10/13 1041;  ondansetron [facility]) injection 4 mg, 4 mg, Intravenous, Q6H PRN, Eugenie Filler, MD predniSONE (DELTASONE) tablet 5 mg, 5 mg, Oral, Q breakfast, Eugenie Filler, MD, 5 mg at 08/10/13 0815;  senna-docusate (Senokot-S)  tablet 1 tablet, 1 tablet, Oral, QHS PRN, Eugenie Filler, MD;  Derrill Memo ON 08/11/2013] simvastatin (ZOCOR) tablet 10 mg, 10 mg, Oral, QODAY, Donzetta Starch, NP  Patients Current Diet: Cardiac  Precautions / Restrictions Precautions Precautions: Fall Precaution Comments: right hemiperesis Restrictions Weight Bearing Restrictions: No   Prior Activity Level Limited Community (1-2x/wk): Went out 2 X a week to get hair done on Friday, to drug store, or to grocery store.  Home Assistive Devices / Equipment Home Assistive Devices/Equipment: None Home Equipment: Walker - 2 wheels;Walker - 4 wheels;Cane - quad;Cane - single point  Prior Functional Level Prior Function Level of Independence: Independent with assistive device(s) Comments: per pt report, she used the rollator inside and the cane in the community and at church.  She still drives.    Current Functional Level Cognition  Overall Cognitive Status: Within Functional Limits for tasks assessed Orientation Level: Oriented X4    Extremity Assessment (includes Sensation/Coordination)          ADLs       Mobility  Overal bed mobility: Needs Assistance Bed Mobility: Supine to Sit;Sit to Supine Supine to sit: Modified independent (Device/Increase time) Sit to supine: Min assist General bed mobility comments: HOB raised to 45 degrees and using railing to pull up.  Min assist of right leg to get back into the bed.      Transfers  Overall transfer level: Needs assistance Equipment used: 4-wheeled walker Transfers: Sit to/from Stand Sit to Stand: Min assist General transfer comment: Min assist from lower toilet seat and lower recliner chair to get to standing.  Support needed at trunk to stabilize for balance and verbal cues for safe hand placment and safety with rollator breaks during transitions.      Ambulation / Gait / Stairs / Wheelchair Mobility  Ambulation/Gait Ambulation Distance (Feet): 200 Feet Gait velocity:  decreased General Gait Details: Pt at times (after shoes donned) was kicking right side of RW.  Better foot and leg progression on this side today, but still signs of fatigue at end of gait.  Pt also more unsteady when multi tasking walking and talking, walking and turning, walking and stopping to talk (taking one hand off of the rollator).      Posture / Balance Dynamic Sitting Balance Sitting balance - Comments: Pt attempting to donn socks sitting EOB.  Min assist to prevent  LOB anteriorly while attempting to put on socks.  She was able to successfully donn her right, but not her left sock sitting EOB.      Special needs/care consideration BiPAP/CPAP No CPM No Continuous Drip IV No Dialysis No        Life Vest No Oxygen No Special Bed No Trach Size No Wound Vac (area) No      Skin Has thin skin after being on prednisone for years.  Bruises easily.  Has a would left lower leg with stitches and a dressing.                             Bowel mgmt: No documented BM since admission.  Had colon cancer in 1960s.  Tends to be constipated. Bladder mgmt: Some incontinence.  Wears mini pads Diabetic mgmt Is hypoglycemic.  Takes glucose tablets at home as needed.    Previous Home Environment Living Arrangements: Alone Available Help at Discharge: Family;Available PRN/intermittently Type of Home: House Home Layout: One level (has one step down into her living area) Home Access: Stairs to enter Entrance Stairs-Rails: Right (leaves cane outside for getting up and down the stairs) Entrance Stairs-Number of Steps: 2 Home Care Services: No  Discharge Living Setting Plans for Discharge Living Setting: Patient's home;Alone;House Type of Home at Discharge: House Discharge Home Layout: One level Discharge Home Access: Stairs to enter Entrance Stairs-Number of Steps: 2 steps to porch and 1 step into the door. (Has 1 step down to her den inside the home.) Does the patient have any problems obtaining  your medications?: No  Social/Family/Support Systems Patient Roles: Parent (Has a son and a daughter.) Contact Information: Nila Nephew - daughter and Keyanni Whittinghill - son Anticipated Caregiver: self and family Anticipated Caregiver's Contact Information: Nickola Major" (h) [phone] (c) [phone] Ability/Limitations of Caregiver: Daughter is leaving soon to be gone for 2 weeks to see grandson.  Has neighbor who can check on patient and help with groceries. Caregiver Availability: 24/7 Discharge Plan Discussed with Primary Caregiver: Yes Is Caregiver In Agreement with Plan?: Yes Does Caregiver/Family have Issues with Lodging/Transportation while Pt is in Rehab?: No  Goals/Additional Needs Patient/Family Goal for Rehab: PT/OT mod I goals, no ST needs Expected length of stay: 7-10 days Cultural Considerations: [facility] Dietary Needs: Heart, thin liquids Equipment Needs: TBD Pt/Family Agrees to Admission and willing to participate: Yes Program Orientation Provided & Reviewed with Pt/Caregiver Including Roles  & Responsibilities: Yes  Decrease burden of Care through IP rehab admission: N/A  Possible need for SNF placement upon discharge: Not planned  Patient Condition: This patient's condition remains as documented in the consult dated 08/10/13, in which the Rehabilitation Physician determined and documented that the patient's condition is appropriate for intensive rehabilitative care in an inpatient rehabilitation facility. Will admit to inpatient rehab today.  Preadmission Screen Completed By:  Retta Diones, 08/10/2013 3:03 PM ______________________________________________________________________   Discussed status with Dr. Naaman Plummer on 08/10/13 at 1513 and received telephone approval for admission today.  Admission Coordinator:  Retta Diones, time1513/Date02/04/15

## 2013-08-10 NOTE — Progress Notes (Signed)
Pt arrived to rehab at 1740 with family at bedside. Reviewed rehab process and scheduling with verbal understanding. Pt aware of safety plan and precautions. Call bell within reach, bed alarm on and pt resting with family at bedside.

## 2013-08-10 NOTE — Consult Note (Signed)
Physical Medicine and Rehabilitation Consult Reason for Consult: CVA Referring Physician: [facility]   HPI: Krystal Hensley is a 80 y.o. right-handed female with history of atrial fibrillation maintained on aspirin therapy, hypertension, polymyalgia rheumatica. Patient was modified independent prior to admission using a rolling walker inside the home and a cane for community access and still driving. Admitted 08/08/2013 with acute onset of right-sided weakness. MRI of the brain showed acute small infarcts scattered throughout the left frontal parietal lobe as well as remote tiny right cerebellar infarct. MRA of the head with intracranial atherosclerotic type changes. Carotid Dopplers with no ICA stenosis. Echocardiogram with ejection fraction [id number] grade 1 diastolic dysfunction. Patient did not receive TPA. Neurology services consulted presently maintained on aspirin 81 mg daily as well as subcutaneous Lovenox for DVT prophylaxis. Patient is tolerating a regular diet. Physical therapy evaluation completed 08/09/2013 with recommendations for physical medicine rehabilitation consult to consider inpatient rehabilitation services.  Pt is motivated to do "more therapy" Review of Systems  Cardiovascular: Positive for palpitations.  Musculoskeletal: Positive for back pain, joint pain and myalgias.  All other systems reviewed and are negative.   Past Medical History  Diagnosis Date  . Hypertension   . Arthritis   . Atrial fibrillation   . Back pain   . Cancer     colon  . Polymyalgia rheumatica   . Hypoglycemia   . HTN (hypertension) 08/08/2013  . A-fib 08/08/2013   Past Surgical History  Procedure Laterality Date  . Colon surgery    . Cesarean section    . Bilateral oophorectomy     History reviewed. No pertinent family history. Social History:  reports that she has never smoked. She does not have any smokeless tobacco history on file. She reports that she does not drink alcohol or use  illicit drugs. Allergies: No Known Allergies Medications Prior to Admission  Medication Sig Dispense Refill  . acetaminophen (TYLENOL) 500 MG tablet Take 500 mg by mouth 3 (three) times daily as needed.      Marland Kitchen aspirin EC 81 MG tablet Take 81 mg by mouth 3 (three) times a week. Sunday, Wednesday and Saturday      . calcium-vitamin D (OSCAL WITH D) 500-200 MG-UNIT per tablet Take 1 tablet by mouth daily.      . hydrochlorothiazide (HYDRODIURIL) 25 MG tablet Take 25 mg by mouth daily.      Marland Kitchen lidocaine (LIDODERM) 5 % Place 0.5 patches onto the skin daily. Remove & Discard patch within 12 hours or as directed by MD      . metoprolol succinate (TOPROL-XL) 50 MG 24 hr tablet Take 25-50 mg by mouth 2 (two) times daily. Patient takes 1 tablet in the morning and 1/2 tablet in the evening      . Multiple Vitamins-Minerals (OCUVITE-LUTEIN PO) Take 1 tablet by mouth daily.      Marland Kitchen omeprazole (PRILOSEC OTC) 20 MG tablet Take 20 mg by mouth every other day.      . predniSONE (DELTASONE) 5 MG tablet Take 5 mg by mouth daily.        Home: Home Living Family/patient expects to be discharged to:: Private residence Living Arrangements: Alone Available Help at Discharge: Family;Available PRN/intermittently Type of Home: House Home Access: Stairs to enter [employer] of Steps: 2 Entrance Stairs-Rails: Right (leaves cane outside for getting up and down the stairs) Home Layout: One level (has one step down into her living area) Home Equipment: Gilford Rile - 2  wheels;Walker - 4 wheels;Cane - quad;Cane - single point  Functional History: Prior Function Comments: per pt report, she used the rollator inside and the cane in the community and at church.  She still drives.   Functional Status:  Mobility:     Ambulation/Gait Ambulation Distance (Feet): 75 Feet Gait velocity: decreased General Gait Details: Pt with difficulty progressing right leg forward during gait.  Assist needed to steady pt at trunk for  balance and to verbally cue her to be closer to the rollator.      ADL:    Cognition: Cognition Overall Cognitive Status: Within Functional Limits for tasks assessed (A& O x 4) Orientation Level: Oriented X4 Cognition Arousal/Alertness: Awake/alert Behavior During Therapy: WFL for tasks assessed/performed Overall Cognitive Status: Within Functional Limits for tasks assessed (A& O x 4)  Blood pressure 167/82, pulse 71, temperature 97.7 F (36.5 C), temperature source Oral, resp. rate 21, height 5' (1.524 m), weight 53.071 kg (117 lb), SpO2 97.00%. Physical Exam  Vitals reviewed. Constitutional: She is oriented to person, place, and time. She appears well-developed.  HENT:  Head: Normocephalic.  Eyes: EOM are normal.  Neck: Normal range of motion. Neck supple. No thyromegaly present.  Cardiovascular:  Cardiac controlled  Respiratory: Effort normal and breath sounds normal. No respiratory distress.  GI: Soft. Bowel sounds are normal. She exhibits no distension.  Neurological: She is alert and oriented to person, place, and time.  Follows full commands  Skin: Skin is warm and dry.  Left ant shin with large abrasion, dark red blood and subq fat exposed  Psychiatric: She has a normal mood and affect.    No results found for this or any previous visit (from the past 24 hour(s)). Dg Chest 2 View  08/08/2013   CLINICAL DATA:  Stroke.  Weakness.  EXAM: CHEST  2 VIEW  COMPARISON:  By 10/24/2012  FINDINGS: Cardiac silhouette is mildly enlarged. Mediastinum is normal in contour. No hilar masses.  Clear lungs. The bony thorax is diffusely demineralized but grossly intact.  IMPRESSION: No acute cardiopulmonary disease.   Electronically Signed   By: Lajean Manes M.D.   On: 08/08/2013 13:00   Ct Head Wo Contrast  08/08/2013   CLINICAL DATA:  Right arm weakness  EXAM: CT HEAD WITHOUT CONTRAST  TECHNIQUE: Contiguous axial images were obtained from the base of the skull through the vertex without  intravenous contrast.  COMPARISON:  11/01/2012  FINDINGS: Bony calvarium is intact. No gross soft tissue abnormality is seen. Mild atrophic changes are noted. Chronic white matter ischemic change is seen. No findings to suggest acute hemorrhage, acute infarction or space-occupying mass lesion are noted.  IMPRESSION: Chronic changes without acute abnormality.  These results were called by telephone at the time of interpretation on 08/08/2013 at 10:22 AM to Dr. Serita Grit , who verbally acknowledged these results.   Electronically Signed   By: Inez Catalina M.D.   On: 08/08/2013 10:23   Mr Virgel Paling SA Contrast  08/08/2013   ADDENDUM REPORT: 08/08/2013 14:49  ADDENDUM: These results were called by telephone at the time of interpretation on 08/08/2013 at 2:48 PM to Dr. Dorian Pod , who verbally acknowledged these results.   Electronically Signed   By: Chauncey Cruel M.D.   On: 08/08/2013 14:49   08/08/2013   CLINICAL DATA:  Not able to move right arm. History of high blood pressure and colon cancer.  EXAM: MRI HEAD WITHOUT CONTRAST  MRA HEAD WITHOUT CONTRAST  TECHNIQUE: Multiplanar,  multiecho pulse sequences of the brain and surrounding structures were obtained without intravenous contrast. Angiographic images of the head were obtained using MRA technique without contrast.  COMPARISON:  08/08/2013 CT.  No comparison MR.  FINDINGS: MRI HEAD FINDINGS  Acute small infarcts scattered throughout the left frontal-parietal lobe.  No intracranial hemorrhage.  Prominent small vessel disease type changes.  Remote tiny right cerebellar infarct.  Global atrophy without hydrocephalus.  No intracranial mass lesion noted on this unenhanced exam.  Cervical spondylotic changes with spinal stenosis most prominent C3-4 level. Slight cranial settling of C1 ring and transverse ligament hypertrophy.  Pituitary region, pineal region and orbital structures unremarkable.  Minimal mucosal thickening paranasal sinuses.  MRA HEAD FINDINGS   Cavernous segment internal carotid artery mild narrowing and ectasia.  Mild to moderate right-sided and mild left-sided supraclinoid segment internal carotid artery narrowing.  No high-grade stenosis of the carotid terminus, M1 segment of the middle cerebral artery or A1 segment of the anterior cerebral artery on either side.  Middle cerebral artery branch vessel irregularity bilaterally with decrease number of visualized left middle cerebral artery branches consistent with patient's acute infarct.  Fetal type contribution to the posterior cerebral artery bilaterally with small caliber basilar artery.  Left vertebral artery is dominant. Right vertebral artery predominantly ends in a right posterior inferior cerebellar artery distribution. Mild to moderate narrowing of portions of the distal right vertebral artery and right posterior inferior cerebellar artery.  Poor delineation of the left posterior inferior cerebellar artery.  Poor delineation of the right anterior inferior cerebellar artery.  Narrowing superior cerebellar artery bilaterally.  No aneurysm noted.  IMPRESSION: Acute small infarcts scattered throughout the left frontal-parietal lobe.  Prominent small vessel disease type changes.  Global atrophy without hydrocephalus.  Cervical spondylotic changes as noted above.  Intracranial atherosclerotic type changes as detailed above.  These results will be called to the ordering clinician or representative by the Radiologist Assistant, and communication documented in the PACS Dashboard.  Electronically Signed: By: Bridgett Larsson M.D. On: 08/08/2013 14:44   Mr Brain Wo Contrast  08/08/2013   ADDENDUM REPORT: 08/08/2013 14:49  ADDENDUM: These results were called by telephone at the time of interpretation on 08/08/2013 at 2:48 PM to Dr. Wyatt Portela , who verbally acknowledged these results.   Electronically Signed   By: Bridgett Larsson M.D.   On: 08/08/2013 14:49   08/08/2013   CLINICAL DATA:  Not able to move right  arm. History of high blood pressure and colon cancer.  EXAM: MRI HEAD WITHOUT CONTRAST  MRA HEAD WITHOUT CONTRAST  TECHNIQUE: Multiplanar, multiecho pulse sequences of the brain and surrounding structures were obtained without intravenous contrast. Angiographic images of the head were obtained using MRA technique without contrast.  COMPARISON:  08/08/2013 CT.  No comparison MR.  FINDINGS: MRI HEAD FINDINGS  Acute small infarcts scattered throughout the left frontal-parietal lobe.  No intracranial hemorrhage.  Prominent small vessel disease type changes.  Remote tiny right cerebellar infarct.  Global atrophy without hydrocephalus.  No intracranial mass lesion noted on this unenhanced exam.  Cervical spondylotic changes with spinal stenosis most prominent C3-4 level. Slight cranial settling of C1 ring and transverse ligament hypertrophy.  Pituitary region, pineal region and orbital structures unremarkable.  Minimal mucosal thickening paranasal sinuses.  MRA HEAD FINDINGS  Cavernous segment internal carotid artery mild narrowing and ectasia.  Mild to moderate right-sided and mild left-sided supraclinoid segment internal carotid artery narrowing.  No high-grade stenosis of the carotid terminus, M1  segment of the middle cerebral artery or A1 segment of the anterior cerebral artery on either side.  Middle cerebral artery branch vessel irregularity bilaterally with decrease number of visualized left middle cerebral artery branches consistent with patient's acute infarct.  Fetal type contribution to the posterior cerebral artery bilaterally with small caliber basilar artery.  Left vertebral artery is dominant. Right vertebral artery predominantly ends in a right posterior inferior cerebellar artery distribution. Mild to moderate narrowing of portions of the distal right vertebral artery and right posterior inferior cerebellar artery.  Poor delineation of the left posterior inferior cerebellar artery.  Poor delineation of  the right anterior inferior cerebellar artery.  Narrowing superior cerebellar artery bilaterally.  No aneurysm noted.  IMPRESSION: Acute small infarcts scattered throughout the left frontal-parietal lobe.  Prominent small vessel disease type changes.  Global atrophy without hydrocephalus.  Cervical spondylotic changes as noted above.  Intracranial atherosclerotic type changes as detailed above.  These results will be called to the ordering clinician or representative by the Radiologist Assistant, and communication documented in the PACS Dashboard.  Electronically Signed: By: Chauncey Cruel M.D. On: 08/08/2013 14:44    Assessment/Plan: Diagnosis: Embolic left Frontoparietal infarcts with reduced balance and RUE proximal weakness 1. Does the need for close, 24 hr/day medical supervision in concert with the patient's rehab needs make it unreasonable for this patient to be served in a less intensive setting? Yes 2. Co-Morbidities requiring supervision/potential complications: Afib, PMR, Left ant distal leg wound 3. Due to bladder management, bowel management, safety, skin/wound care, disease management, medication administration, pain management and patient education, does the patient require 24 hr/day rehab nursing? Yes 4. Does the patient require coordinated care of a physician, rehab nurse, PT (1-2 hrs/day, 5 days/week) and OT (1-2 hrs/day, 5 days/week) to address physical and functional deficits in the context of the above medical diagnosis(es)? Yes Addressing deficits in the following areas: balance, endurance, locomotion, strength, transferring, bowel/bladder control, bathing, dressing, feeding, grooming and toileting 5. Can the patient actively participate in an intensive therapy program of at least 3 hrs of therapy per day at least 5 days per week? Yes 6. The potential for patient to make measurable gains while on inpatient rehab is good 7. Anticipated functional outcomes upon discharge from inpatient  rehab are modified independent with PT, modified independent with OT, and a with SLP. 8. Estimated rehab length of stay to reach the above functional goals is: 7-10 days 9. Does the patient have adequate social supports to accommodate these discharge functional goals? Potentially 10. Anticipated D/C setting: Home 11. Anticipated post D/C treatments: [place] therapy 12. Overall Rehab/Functional Prognosis: good  RECOMMENDATIONS: This patient's condition is appropriate for continued rehabilitative care in the following setting: CIR Patient has agreed to participate in recommended program. Yes Note that insurance prior authorization may be required for reimbursement for recommended care.  Comment:     08/10/2013

## 2013-08-10 NOTE — Telephone Encounter (Signed)
Spoke with pt's daughter, she has been admitted to [facility] but she has one stitch in her leg. I advised her to mention this to the nurse/doctor caring for her mother. They may be able to take it out for her. If there were any questions I told her we would be happy to help.

## 2013-08-10 NOTE — H&P (Signed)
Physical Medicine and Rehabilitation Admission H&P    Chief Complaint  Patient presents with  . Code Stroke  :  Chief complaint: Right-sided weakness  HPI: Krystal Hensley is a 80 y.o. right-handed female with history of atrial fibrillation maintained on aspirin therapy, hypertension, polymyalgia rheumatica. Patient was modified independent prior to admission using a rolling walker inside the home and a cane for community access and still driving. Admitted 08/08/2013 with acute onset of right-sided weakness. MRI of the brain showed acute small infarcts scattered throughout the left frontal parietal lobe as well as remote tiny right cerebellar infarct. MRA of the head with intracranial atherosclerotic type changes. Carotid Dopplers with no ICA stenosis. Echocardiogram with ejection fraction 38% grade 1 diastolic dysfunction. Patient did not receive TPA. Neurology services consulted presently maintained on aspirin 81 mg daily as well as subcutaneous Lovenox for DVT prophylaxis. Patient is tolerating a regular diet. Physical therapy evaluation completed 08/09/2013 with recommendations for physical medicine rehabilitation consult to consider inpatient rehabilitation services. Patient was admitted for comprehensive rehabilitation program  ROS Review of Systems  Cardiovascular: Positive for palpitations.  Musculoskeletal: Positive for back pain, joint pain and myalgias.  All other systems reviewed and are negative  Past Medical History  Diagnosis Date  . Hypertension   . Arthritis   . Atrial fibrillation   . Back pain   . Cancer     colon  . Polymyalgia rheumatica   . Hypoglycemia   . HTN (hypertension) 08/08/2013  . A-fib 08/08/2013   Past Surgical History  Procedure Laterality Date  . Colon surgery    . Cesarean section    . Bilateral oophorectomy     History reviewed. No pertinent family history. Social History:  reports that she has never smoked. She does not have any smokeless  tobacco history on file. She reports that she does not drink alcohol or use illicit drugs. Allergies: No Known Allergies Medications Prior to Admission  Medication Sig Dispense Refill  . acetaminophen (TYLENOL) 500 MG tablet Take 500 mg by mouth 3 (three) times daily as needed.      Marland Kitchen aspirin EC 81 MG tablet Take 81 mg by mouth 3 (three) times a week. Sunday, Wednesday and Saturday      . calcium-vitamin D (OSCAL WITH D) 500-200 MG-UNIT per tablet Take 1 tablet by mouth daily.      . hydrochlorothiazide (HYDRODIURIL) 25 MG tablet Take 25 mg by mouth daily.      Marland Kitchen lidocaine (LIDODERM) 5 % Place 0.5 patches onto the skin daily. Remove & Discard patch within 12 hours or as directed by MD      . metoprolol succinate (TOPROL-XL) 50 MG 24 hr tablet Take 25-50 mg by mouth 2 (two) times daily. Patient takes 1 tablet in the morning and 1/2 tablet in the evening      . Multiple Vitamins-Minerals (OCUVITE-LUTEIN PO) Take 1 tablet by mouth daily.      Marland Kitchen omeprazole (PRILOSEC OTC) 20 MG tablet Take 20 mg by mouth every other day.      . predniSONE (DELTASONE) 5 MG tablet Take 5 mg by mouth daily.        Home: Home Living Family/patient expects to be discharged to:: Private residence Living Arrangements: Alone Available Help at Discharge: Family;Available PRN/intermittently Type of Home: House Home Access: Stairs to enter [employer] of Steps: 2 Entrance Stairs-Rails: Right (leaves cane outside for getting up and down the stairs) Home Layout: One level (has one  step down into her living area) Home Equipment: Gilford Rile - 2 wheels;Walker - 4 wheels;Cane - quad;Cane - single point   Functional History: Prior Function Comments: per pt report, she used the rollator inside and the cane in the community and at church.  She still drives.    Functional Status:  Mobility: min assist     Ambulation/Gait Ambulation Distance (Feet): 75 Feet Gait velocity: decreased General Gait Details: Pt with  difficulty progressing right leg forward during gait.  Assist needed to steady pt at trunk for balance and to verbally cue her to be closer to the rollator.      ADL:    Cognition: Cognition Overall Cognitive Status: Within Functional Limits for tasks assessed (A& O x 4) Orientation Level: Oriented X4 Cognition Arousal/Alertness: Awake/alert Behavior During Therapy: WFL for tasks assessed/performed Overall Cognitive Status: Within Functional Limits for tasks assessed (A& O x 4)     Blood pressure 168/79, pulse 74, temperature 97.6 F (36.4 C), temperature source Oral, resp. rate 19, height 5' (1.524 m), weight 53.071 kg (117 lb), SpO2 96.00%. Physical Exam Constitutional: She is oriented to person, place, and time. She appears well-developed.  HENT:  Head: Normocephalic.  Eyes: EOM are normal.  Neck: Normal range of motion. Neck supple. No thyromegaly present.  Cardiovascular:  Cardiac controlled. murmur Respiratory: Effort normal and breath sounds normal. No respiratory distress.  GI: Soft. Bowel sounds are normal. She exhibits no distension.  Neurological: She is alert and oriented to person, place, and time.  Follows full commands. Mild right-sided weakness, proximal greater than distal. Right facial droop. Speech generally clear.  Skin: Skin is warm and dry.  Left ant shin with large abrasion, old blood and fat exposed  Psychiatric: She has a normal mood and affect  Results for orders placed during the hospital encounter of 08/08/13 (from the past 48 hour(s))  URINE RAPID DRUG SCREEN (HOSP PERFORMED)     Status: None   Collection Time    08/08/13 11:35 AM      Result Value Range   Opiates NONE DETECTED  NONE DETECTED   Cocaine NONE DETECTED  NONE DETECTED   Benzodiazepines NONE DETECTED  NONE DETECTED   Amphetamines NONE DETECTED  NONE DETECTED   Tetrahydrocannabinol NONE DETECTED  NONE DETECTED   Barbiturates NONE DETECTED  NONE DETECTED   Comment:            DRUG  SCREEN FOR MEDICAL PURPOSES     ONLY.  IF CONFIRMATION IS NEEDED     FOR ANY PURPOSE, NOTIFY LAB     WITHIN 5 DAYS.                LOWEST DETECTABLE LIMITS     FOR URINE DRUG SCREEN     Drug Class       Cutoff (ng/mL)     Amphetamine      1000     Barbiturate      200     Benzodiazepine   124     Tricyclics       580     Opiates          300     Cocaine          300     THC              50  URINALYSIS, ROUTINE W REFLEX MICROSCOPIC     Status: Abnormal   Collection Time    08/08/13 11:35 AM  Result Value Range   Color, Urine YELLOW  YELLOW   APPearance CLEAR  CLEAR   Specific Gravity, Urine 1.010  1.005 - 1.030   pH 7.0  5.0 - 8.0   Glucose, UA NEGATIVE  NEGATIVE mg/dL   Hgb urine dipstick SMALL (*) NEGATIVE   Bilirubin Urine NEGATIVE  NEGATIVE   Ketones, ur NEGATIVE  NEGATIVE mg/dL   Protein, ur NEGATIVE  NEGATIVE mg/dL   Urobilinogen, UA 0.2  0.0 - 1.0 mg/dL   Nitrite NEGATIVE  NEGATIVE   Leukocytes, UA NEGATIVE  NEGATIVE  URINE MICROSCOPIC-ADD ON     Status: None   Collection Time    08/08/13 11:35 AM      Result Value Range   Squamous Epithelial / LPF RARE  RARE   WBC, UA 0-2  <3 WBC/hpf   RBC / HPF 0-2  <3 RBC/hpf   Bacteria, UA RARE  RARE  CBC     Status: Abnormal   Collection Time    08/08/13 11:41 AM      Result Value Range   WBC 11.0 (*) 4.0 - 10.5 K/uL   RBC 4.39  3.87 - 5.11 MIL/uL   Hemoglobin 14.7  12.0 - 15.0 g/dL   HCT 42.4  36.0 - 46.0 %   MCV 96.6  78.0 - 100.0 fL   MCH 33.5  26.0 - 34.0 pg   MCHC 34.7  30.0 - 36.0 g/dL   RDW 14.3  11.5 - 15.5 %   Platelets 213  150 - 400 K/uL  CREATININE, SERUM     Status: Abnormal   Collection Time    08/08/13 11:41 AM      Result Value Range   Creatinine, Ser 0.51  0.50 - 1.10 mg/dL   GFR calc non Af Amer 80 (*) >90 mL/min   GFR calc Af Amer >90  >90 mL/min   Comment: (NOTE)     The eGFR has been calculated using the CKD EPI equation.     This calculation has not been validated in all clinical  situations.     eGFR's persistently <90 mL/min signify possible Chronic Kidney     Disease.  HEMOGLOBIN A1C     Status: Abnormal   Collection Time    08/09/13  3:26 AM      Result Value Range   Hemoglobin A1C 5.9 (*) <5.7 %   Comment: (NOTE)                                                                               According to the ADA Clinical Practice Recommendations for 2011, when     HbA1c is used as a screening test:      >=6.5%   Diagnostic of Diabetes Mellitus               (if abnormal result is confirmed)     5.7-6.4%   Increased risk of developing Diabetes Mellitus     References:Diagnosis and Classification of Diabetes Mellitus,Diabetes     QPYP,9509,32(IZTIW 1):S62-S69 and Standards of Medical Care in             Diabetes - 2011,Diabetes Care,2011,34 (Suppl 1):S11-S61.   Mean Plasma Glucose  123 (*) <117 mg/dL   Comment: Performed at [facility]: Abnormal   Collection Time    08/09/13  3:26 AM      Result Value Range   Sodium 140  137 - 147 mEq/L   Potassium 3.9  3.7 - 5.3 mEq/L   Chloride 103  96 - 112 mEq/L   CO2 25  19 - 32 mEq/L   Glucose, Bld 91  70 - 99 mg/dL   BUN 8  6 - 23 mg/dL   Creatinine, Ser 0.60  0.50 - 1.10 mg/dL   Calcium 8.5  8.4 - 10.5 mg/dL   GFR calc non Af Amer 76 (*) >90 mL/min   GFR calc Af Amer 88 (*) >90 mL/min   Comment: (NOTE)     The eGFR has been calculated using the CKD EPI equation.     This calculation has not been validated in all clinical situations.     eGFR's persistently <90 mL/min signify possible Chronic Kidney     Disease.  CBC     Status: None   Collection Time    08/09/13  3:26 AM      Result Value Range   WBC 8.2  4.0 - 10.5 K/uL   RBC 4.07  3.87 - 5.11 MIL/uL   Hemoglobin 13.7  12.0 - 15.0 g/dL   HCT 39.9  36.0 - 46.0 %   MCV 98.0  78.0 - 100.0 fL   MCH 33.7  26.0 - 34.0 pg   MCHC 34.3  30.0 - 36.0 g/dL   RDW 14.4  11.5 - 15.5 %   Platelets 202  150 - 400 K/uL    LIPID PANEL     Status: Abnormal   Collection Time    08/09/13  3:26 AM      Result Value Range   Cholesterol 216 (*) 0 - 200 mg/dL   Triglycerides 116  <150 mg/dL   HDL 73  >39 mg/dL   Total CHOL/HDL Ratio 3.0     VLDL 23  0 - 40 mg/dL   LDL Cholesterol 120 (*) 0 - 99 mg/dL   Comment:            Total Cholesterol/HDL:CHD Risk     Coronary Heart Disease Risk Table                         Men   Women      1/2 Average Risk   3.4   3.3      Average Risk       5.0   4.4      2 X Average Risk   9.6   7.1      3 X Average Risk  23.4   11.0                Use the calculated Patient Ratio     above and the CHD Risk Table     to determine the patient's CHD Risk.                ATP III CLASSIFICATION (LDL):      <100     mg/dL   Optimal      100-129  mg/dL   Near or Above                        Optimal  130-159  mg/dL   Borderline      160-189  mg/dL   High      >190     mg/dL   Very High   Dg Chest 2 View  08/08/2013   CLINICAL DATA:  Stroke.  Weakness.  EXAM: CHEST  2 VIEW  COMPARISON:  By 10/24/2012  FINDINGS: Cardiac silhouette is mildly enlarged. Mediastinum is normal in contour. No hilar masses.  Clear lungs. The bony thorax is diffusely demineralized but grossly intact.  IMPRESSION: No acute cardiopulmonary disease.   Electronically Signed   By: Lajean Manes M.D.   On: 08/08/2013 13:00   Mr Virgel Paling Wo Contrast  08/08/2013   ADDENDUM REPORT: 08/08/2013 14:49  ADDENDUM: These results were called by telephone at the time of interpretation on 08/08/2013 at 2:48 PM to Dr. Dorian Pod , who verbally acknowledged these results.   Electronically Signed   By: Chauncey Cruel M.D.   On: 08/08/2013 14:49   08/08/2013   CLINICAL DATA:  Not able to move right arm. History of high blood pressure and colon cancer.  EXAM: MRI HEAD WITHOUT CONTRAST  MRA HEAD WITHOUT CONTRAST  TECHNIQUE: Multiplanar, multiecho pulse sequences of the brain and surrounding structures were obtained without intravenous  contrast. Angiographic images of the head were obtained using MRA technique without contrast.  COMPARISON:  08/08/2013 CT.  No comparison MR.  FINDINGS: MRI HEAD FINDINGS  Acute small infarcts scattered throughout the left frontal-parietal lobe.  No intracranial hemorrhage.  Prominent small vessel disease type changes.  Remote tiny right cerebellar infarct.  Global atrophy without hydrocephalus.  No intracranial mass lesion noted on this unenhanced exam.  Cervical spondylotic changes with spinal stenosis most prominent C3-4 level. Slight cranial settling of C1 ring and transverse ligament hypertrophy.  Pituitary region, pineal region and orbital structures unremarkable.  Minimal mucosal thickening paranasal sinuses.  MRA HEAD FINDINGS  Cavernous segment internal carotid artery mild narrowing and ectasia.  Mild to moderate right-sided and mild left-sided supraclinoid segment internal carotid artery narrowing.  No high-grade stenosis of the carotid terminus, M1 segment of the middle cerebral artery or A1 segment of the anterior cerebral artery on either side.  Middle cerebral artery branch vessel irregularity bilaterally with decrease number of visualized left middle cerebral artery branches consistent with patient's acute infarct.  Fetal type contribution to the posterior cerebral artery bilaterally with small caliber basilar artery.  Left vertebral artery is dominant. Right vertebral artery predominantly ends in a right posterior inferior cerebellar artery distribution. Mild to moderate narrowing of portions of the distal right vertebral artery and right posterior inferior cerebellar artery.  Poor delineation of the left posterior inferior cerebellar artery.  Poor delineation of the right anterior inferior cerebellar artery.  Narrowing superior cerebellar artery bilaterally.  No aneurysm noted.  IMPRESSION: Acute small infarcts scattered throughout the left frontal-parietal lobe.  Prominent small vessel disease type  changes.  Global atrophy without hydrocephalus.  Cervical spondylotic changes as noted above.  Intracranial atherosclerotic type changes as detailed above.  These results will be called to the ordering clinician or representative by the Radiologist Assistant, and communication documented in the PACS Dashboard.  Electronically Signed: By: Chauncey Cruel M.D. On: 08/08/2013 14:44   Mr Brain Wo Contrast  08/08/2013   ADDENDUM REPORT: 08/08/2013 14:49  ADDENDUM: These results were called by telephone at the time of interpretation on 08/08/2013 at 2:48 PM to Dr. Dorian Pod , who verbally acknowledged these results.   Electronically Signed  By: Chauncey Cruel M.D.   On: 08/08/2013 14:49   08/08/2013   CLINICAL DATA:  Not able to move right arm. History of high blood pressure and colon cancer.  EXAM: MRI HEAD WITHOUT CONTRAST  MRA HEAD WITHOUT CONTRAST  TECHNIQUE: Multiplanar, multiecho pulse sequences of the brain and surrounding structures were obtained without intravenous contrast. Angiographic images of the head were obtained using MRA technique without contrast.  COMPARISON:  08/08/2013 CT.  No comparison MR.  FINDINGS: MRI HEAD FINDINGS  Acute small infarcts scattered throughout the left frontal-parietal lobe.  No intracranial hemorrhage.  Prominent small vessel disease type changes.  Remote tiny right cerebellar infarct.  Global atrophy without hydrocephalus.  No intracranial mass lesion noted on this unenhanced exam.  Cervical spondylotic changes with spinal stenosis most prominent C3-4 level. Slight cranial settling of C1 ring and transverse ligament hypertrophy.  Pituitary region, pineal region and orbital structures unremarkable.  Minimal mucosal thickening paranasal sinuses.  MRA HEAD FINDINGS  Cavernous segment internal carotid artery mild narrowing and ectasia.  Mild to moderate right-sided and mild left-sided supraclinoid segment internal carotid artery narrowing.  No high-grade stenosis of the carotid  terminus, M1 segment of the middle cerebral artery or A1 segment of the anterior cerebral artery on either side.  Middle cerebral artery branch vessel irregularity bilaterally with decrease number of visualized left middle cerebral artery branches consistent with patient's acute infarct.  Fetal type contribution to the posterior cerebral artery bilaterally with small caliber basilar artery.  Left vertebral artery is dominant. Right vertebral artery predominantly ends in a right posterior inferior cerebellar artery distribution. Mild to moderate narrowing of portions of the distal right vertebral artery and right posterior inferior cerebellar artery.  Poor delineation of the left posterior inferior cerebellar artery.  Poor delineation of the right anterior inferior cerebellar artery.  Narrowing superior cerebellar artery bilaterally.  No aneurysm noted.  IMPRESSION: Acute small infarcts scattered throughout the left frontal-parietal lobe.  Prominent small vessel disease type changes.  Global atrophy without hydrocephalus.  Cervical spondylotic changes as noted above.  Intracranial atherosclerotic type changes as detailed above.  These results will be called to the ordering clinician or representative by the Radiologist Assistant, and communication documented in the PACS Dashboard.  Electronically Signed: By: Chauncey Cruel M.D. On: 08/08/2013 14:44    Post Admission Physician Evaluation: 1. Functional deficits secondary  to cardioembolic left fronto-parietal infarct. 2. Patient is admitted to receive collaborative, interdisciplinary care between the physiatrist, rehab nursing staff, and therapy team. 3. Patient's level of medical complexity and substantial therapy needs in context of that medical necessity cannot be provided at a lesser intensity of care such as a SNF. 4. Patient has experienced substantial functional loss from his/her baseline which was documented above under the "Functional History" and  "Functional Status" headings.  Judging by the patient's diagnosis, physical exam, and functional history, the patient has potential for functional progress which will result in measurable gains while on inpatient rehab.  These gains will be of substantial and practical use upon discharge  in facilitating mobility and self-care at the household level. 5. Physiatrist will provide 24 hour management of medical needs as well as oversight of the therapy plan/treatment and provide guidance as appropriate regarding the interaction of the two. 6. 24 hour rehab nursing will assist with bladder management, bowel management, safety, skin/wound care, disease management, medication administration, pain management and patient education  and help integrate therapy concepts, techniques,education, etc. 7. PT will assess and treat  for/with: Lower extremity strength, range of motion, stamina, balance, functional mobility, safety, adaptive techniques and equipment, NMR, education.   Goals are: mod I to supervision. 8. OT will assess and treat for/with: ADL's, functional mobility, safety, upper extremity strength, adaptive techniques and equipment, NMR, education.   Goals are: mod I to supervision. 9. SLP will assess and treat for/with: n/a.  Goals are: n/a. 10. Case Management and Social Worker will assess and treat for psychological issues and discharge planning. 11. Team conference will be held weekly to assess progress toward goals and to determine barriers to discharge. 12. Patient will receive at least 3 hours of therapy per day at least 5 days per week. 13. ELOS: 8-10 days       14. Prognosis:  excellent   Medical Problem List and Plan: 1. Embolic left frontoparietal infarct 2. DVT Prophylaxis/Anticoagulation: Subcutaneous Lovenox. Monitor platelet counts and any signs of bleeding 3. Pain Management: Tylenol as needed 4. Neuropsych: This patient is capable of making decisions on her own behalf. 5.  Hypertension/atrial fibrillation. Cardiac rate control. Continue Toprol. 6. Polymyalgia rheumatica. Chronic prednisone 5 mg daily. 7. Hyperlipidemia. Zocor 8. Left lower extremity wound. Silicone foam dressing change every 3 days as needed do not remove Steri-Strips or xeroform placed in the wound bed  Meredith Staggers, MD, [facility]  08/10/2013

## 2013-08-10 NOTE — H&P (View-Only) (Signed)
  Physical Medicine and Rehabilitation Admission H&P    Chief Complaint  Patient presents with  . Code Stroke  :  Chief complaint: Right-sided weakness  HPI: Krystal Hensley is a 80 y.o. right-handed female with history of atrial fibrillation maintained on aspirin therapy, hypertension, polymyalgia rheumatica. Patient was modified independent prior to admission using a rolling walker inside the home and a cane for community access and still driving. Admitted 08/08/2013 with acute onset of right-sided weakness. MRI of the brain showed acute small infarcts scattered throughout the left frontal parietal lobe as well as remote tiny right cerebellar infarct. MRA of the head with intracranial atherosclerotic type changes. Carotid Dopplers with no ICA stenosis. Echocardiogram with ejection fraction 70% grade 1 diastolic dysfunction. Patient did not receive TPA. Neurology services consulted presently maintained on aspirin 81 mg daily as well as subcutaneous Lovenox for DVT prophylaxis. Patient is tolerating a regular diet. Physical therapy evaluation completed 08/09/2013 with recommendations for physical medicine rehabilitation consult to consider inpatient rehabilitation services. Patient was admitted for comprehensive rehabilitation program  ROS Review of Systems  Cardiovascular: Positive for palpitations.  Musculoskeletal: Positive for back pain, joint pain and myalgias.  All other systems reviewed and are negative  Past Medical History  Diagnosis Date  . Hypertension   . Arthritis   . Atrial fibrillation   . Back pain   . Cancer     colon  . Polymyalgia rheumatica   . Hypoglycemia   . HTN (hypertension) 08/08/2013  . A-fib 08/08/2013   Past Surgical History  Procedure Laterality Date  . Colon surgery    . Cesarean section    . Bilateral oophorectomy     History reviewed. No pertinent family history. Social History:  reports that she has never smoked. She does not have any smokeless  tobacco history on file. She reports that she does not drink alcohol or use illicit drugs. Allergies: No Known Allergies Medications Prior to Admission  Medication Sig Dispense Refill  . acetaminophen (TYLENOL) 500 MG tablet Take 500 mg by mouth 3 (three) times daily as needed.      . aspirin EC 81 MG tablet Take 81 mg by mouth 3 (three) times a week. Sunday, Wednesday and Saturday      . calcium-vitamin D (OSCAL WITH D) 500-200 MG-UNIT per tablet Take 1 tablet by mouth daily.      . hydrochlorothiazide (HYDRODIURIL) 25 MG tablet Take 25 mg by mouth daily.      . lidocaine (LIDODERM) 5 % Place 0.5 patches onto the skin daily. Remove & Discard patch within 12 hours or as directed by MD      . metoprolol succinate (TOPROL-XL) 50 MG 24 hr tablet Take 25-50 mg by mouth 2 (two) times daily. Patient takes 1 tablet in the morning and 1/2 tablet in the evening      . Multiple Vitamins-Minerals (OCUVITE-LUTEIN PO) Take 1 tablet by mouth daily.      . omeprazole (PRILOSEC OTC) 20 MG tablet Take 20 mg by mouth every other day.      . predniSONE (DELTASONE) 5 MG tablet Take 5 mg by mouth daily.        Home: Home Living Family/patient expects to be discharged to:: Private residence Living Arrangements: Alone Available Help at Discharge: Family;Available PRN/intermittently Type of Home: House Home Access: Stairs to enter Entrance Stairs-Number of Steps: 2 Entrance Stairs-Rails: Right (leaves cane outside for getting up and down the stairs) Home Layout: One level (has one   step down into her living area) Home Equipment: Gilford Rile - 2 wheels;Walker - 4 wheels;Cane - quad;Cane - single point   Functional History: Prior Function Comments: per pt report, she used the rollator inside and the cane in the community and at church.  She still drives.    Functional Status:  Mobility: min assist     Ambulation/Gait Ambulation Distance (Feet): 75 Feet Gait velocity: decreased General Gait Details: Pt with  difficulty progressing right leg forward during gait.  Assist needed to steady pt at trunk for balance and to verbally cue her to be closer to the rollator.      ADL:    Cognition: Cognition Overall Cognitive Status: Within Functional Limits for tasks assessed (A& O x 4) Orientation Level: Oriented X4 Cognition Arousal/Alertness: Awake/alert Behavior During Therapy: WFL for tasks assessed/performed Overall Cognitive Status: Within Functional Limits for tasks assessed (A& O x 4)     Blood pressure 168/79, pulse 74, temperature 97.6 F (36.4 C), temperature source Oral, resp. rate 19, height 5' (1.524 m), weight 53.071 kg (117 lb), SpO2 96.00%. Physical Exam Constitutional: She is oriented to person, place, and time. She appears well-developed.  HENT:  Head: Normocephalic.  Eyes: EOM are normal.  Neck: Normal range of motion. Neck supple. No thyromegaly present.  Cardiovascular:  Cardiac controlled. murmur Respiratory: Effort normal and breath sounds normal. No respiratory distress.  GI: Soft. Bowel sounds are normal. She exhibits no distension.  Neurological: She is alert and oriented to person, place, and time.  Follows full commands. Mild right-sided weakness, proximal greater than distal. Right facial droop. Speech generally clear.  Skin: Skin is warm and dry.  Left ant shin with large abrasion, old blood and fat exposed  Psychiatric: She has a normal mood and affect  Results for orders placed during the hospital encounter of 08/08/13 (from the past 48 hour(s))  URINE RAPID DRUG SCREEN (HOSP PERFORMED)     Status: None   Collection Time    08/08/13 11:35 AM      Result Value Range   Opiates NONE DETECTED  NONE DETECTED   Cocaine NONE DETECTED  NONE DETECTED   Benzodiazepines NONE DETECTED  NONE DETECTED   Amphetamines NONE DETECTED  NONE DETECTED   Tetrahydrocannabinol NONE DETECTED  NONE DETECTED   Barbiturates NONE DETECTED  NONE DETECTED   Comment:            DRUG  SCREEN FOR MEDICAL PURPOSES     ONLY.  IF CONFIRMATION IS NEEDED     FOR ANY PURPOSE, NOTIFY LAB     WITHIN 5 DAYS.                LOWEST DETECTABLE LIMITS     FOR URINE DRUG SCREEN     Drug Class       Cutoff (ng/mL)     Amphetamine      1000     Barbiturate      200     Benzodiazepine   124     Tricyclics       580     Opiates          300     Cocaine          300     THC              50  URINALYSIS, ROUTINE W REFLEX MICROSCOPIC     Status: Abnormal   Collection Time    08/08/13 11:35 AM  Result Value Range   Color, Urine YELLOW  YELLOW   APPearance CLEAR  CLEAR   Specific Gravity, Urine 1.010  1.005 - 1.030   pH 7.0  5.0 - 8.0   Glucose, UA NEGATIVE  NEGATIVE mg/dL   Hgb urine dipstick SMALL (*) NEGATIVE   Bilirubin Urine NEGATIVE  NEGATIVE   Ketones, ur NEGATIVE  NEGATIVE mg/dL   Protein, ur NEGATIVE  NEGATIVE mg/dL   Urobilinogen, UA 0.2  0.0 - 1.0 mg/dL   Nitrite NEGATIVE  NEGATIVE   Leukocytes, UA NEGATIVE  NEGATIVE  URINE MICROSCOPIC-ADD ON     Status: None   Collection Time    08/08/13 11:35 AM      Result Value Range   Squamous Epithelial / LPF RARE  RARE   WBC, UA 0-2  <3 WBC/hpf   RBC / HPF 0-2  <3 RBC/hpf   Bacteria, UA RARE  RARE  CBC     Status: Abnormal   Collection Time    08/08/13 11:41 AM      Result Value Range   WBC 11.0 (*) 4.0 - 10.5 K/uL   RBC 4.39  3.87 - 5.11 MIL/uL   Hemoglobin 14.7  12.0 - 15.0 g/dL   HCT 42.4  36.0 - 46.0 %   MCV 96.6  78.0 - 100.0 fL   MCH 33.5  26.0 - 34.0 pg   MCHC 34.7  30.0 - 36.0 g/dL   RDW 14.3  11.5 - 15.5 %   Platelets 213  150 - 400 K/uL  CREATININE, SERUM     Status: Abnormal   Collection Time    08/08/13 11:41 AM      Result Value Range   Creatinine, Ser 0.51  0.50 - 1.10 mg/dL   GFR calc non Af Amer 80 (*) >90 mL/min   GFR calc Af Amer >90  >90 mL/min   Comment: (NOTE)     The eGFR has been calculated using the CKD EPI equation.     This calculation has not been validated in all clinical  situations.     eGFR's persistently <90 mL/min signify possible Chronic Kidney     Disease.  HEMOGLOBIN A1C     Status: Abnormal   Collection Time    08/09/13  3:26 AM      Result Value Range   Hemoglobin A1C 5.9 (*) <5.7 %   Comment: (NOTE)                                                                               According to the ADA Clinical Practice Recommendations for 2011, when     HbA1c is used as a screening test:      >=6.5%   Diagnostic of Diabetes Mellitus               (if abnormal result is confirmed)     5.7-6.4%   Increased risk of developing Diabetes Mellitus     References:Diagnosis and Classification of Diabetes Mellitus,Diabetes     QPYP,9509,32(IZTIW 1):S62-S69 and Standards of Medical Care in             Diabetes - 2011,Diabetes Care,2011,34 (Suppl 1):S11-S61.   Mean Plasma Glucose  123 (*) <117 mg/dL   Comment: Performed at [facility]: Abnormal   Collection Time    08/09/13  3:26 AM      Result Value Range   Sodium 140  137 - 147 mEq/L   Potassium 3.9  3.7 - 5.3 mEq/L   Chloride 103  96 - 112 mEq/L   CO2 25  19 - 32 mEq/L   Glucose, Bld 91  70 - 99 mg/dL   BUN 8  6 - 23 mg/dL   Creatinine, Ser 0.60  0.50 - 1.10 mg/dL   Calcium 8.5  8.4 - 10.5 mg/dL   GFR calc non Af Amer 76 (*) >90 mL/min   GFR calc Af Amer 88 (*) >90 mL/min   Comment: (NOTE)     The eGFR has been calculated using the CKD EPI equation.     This calculation has not been validated in all clinical situations.     eGFR's persistently <90 mL/min signify possible Chronic Kidney     Disease.  CBC     Status: None   Collection Time    08/09/13  3:26 AM      Result Value Range   WBC 8.2  4.0 - 10.5 K/uL   RBC 4.07  3.87 - 5.11 MIL/uL   Hemoglobin 13.7  12.0 - 15.0 g/dL   HCT 39.9  36.0 - 46.0 %   MCV 98.0  78.0 - 100.0 fL   MCH 33.7  26.0 - 34.0 pg   MCHC 34.3  30.0 - 36.0 g/dL   RDW 14.4  11.5 - 15.5 %   Platelets 202  150 - 400 K/uL    LIPID PANEL     Status: Abnormal   Collection Time    08/09/13  3:26 AM      Result Value Range   Cholesterol 216 (*) 0 - 200 mg/dL   Triglycerides 116  <150 mg/dL   HDL 73  >39 mg/dL   Total CHOL/HDL Ratio 3.0     VLDL 23  0 - 40 mg/dL   LDL Cholesterol 120 (*) 0 - 99 mg/dL   Comment:            Total Cholesterol/HDL:CHD Risk     Coronary Heart Disease Risk Table                         Men   Women      1/2 Average Risk   3.4   3.3      Average Risk       5.0   4.4      2 X Average Risk   9.6   7.1      3 X Average Risk  23.4   11.0                Use the calculated Patient Ratio     above and the CHD Risk Table     to determine the patient's CHD Risk.                ATP III CLASSIFICATION (LDL):      <100     mg/dL   Optimal      100-129  mg/dL   Near or Above                        Optimal  130-159  mg/dL   Borderline      160-189  mg/dL   High      >190     mg/dL   Very High   Dg Chest 2 View  08/08/2013   CLINICAL DATA:  Stroke.  Weakness.  EXAM: CHEST  2 VIEW  COMPARISON:  By 10/24/2012  FINDINGS: Cardiac silhouette is mildly enlarged. Mediastinum is normal in contour. No hilar masses.  Clear lungs. The bony thorax is diffusely demineralized but grossly intact.  IMPRESSION: No acute cardiopulmonary disease.   Electronically Signed   By: Lajean Manes M.D.   On: 08/08/2013 13:00   Mr Virgel Paling Wo Contrast  08/08/2013   ADDENDUM REPORT: 08/08/2013 14:49  ADDENDUM: These results were called by telephone at the time of interpretation on 08/08/2013 at 2:48 PM to Dr. Dorian Pod , who verbally acknowledged these results.   Electronically Signed   By: Chauncey Cruel M.D.   On: 08/08/2013 14:49   08/08/2013   CLINICAL DATA:  Not able to move right arm. History of high blood pressure and colon cancer.  EXAM: MRI HEAD WITHOUT CONTRAST  MRA HEAD WITHOUT CONTRAST  TECHNIQUE: Multiplanar, multiecho pulse sequences of the brain and surrounding structures were obtained without intravenous  contrast. Angiographic images of the head were obtained using MRA technique without contrast.  COMPARISON:  08/08/2013 CT.  No comparison MR.  FINDINGS: MRI HEAD FINDINGS  Acute small infarcts scattered throughout the left frontal-parietal lobe.  No intracranial hemorrhage.  Prominent small vessel disease type changes.  Remote tiny right cerebellar infarct.  Global atrophy without hydrocephalus.  No intracranial mass lesion noted on this unenhanced exam.  Cervical spondylotic changes with spinal stenosis most prominent C3-4 level. Slight cranial settling of C1 ring and transverse ligament hypertrophy.  Pituitary region, pineal region and orbital structures unremarkable.  Minimal mucosal thickening paranasal sinuses.  MRA HEAD FINDINGS  Cavernous segment internal carotid artery mild narrowing and ectasia.  Mild to moderate right-sided and mild left-sided supraclinoid segment internal carotid artery narrowing.  No high-grade stenosis of the carotid terminus, M1 segment of the middle cerebral artery or A1 segment of the anterior cerebral artery on either side.  Middle cerebral artery branch vessel irregularity bilaterally with decrease number of visualized left middle cerebral artery branches consistent with patient's acute infarct.  Fetal type contribution to the posterior cerebral artery bilaterally with small caliber basilar artery.  Left vertebral artery is dominant. Right vertebral artery predominantly ends in a right posterior inferior cerebellar artery distribution. Mild to moderate narrowing of portions of the distal right vertebral artery and right posterior inferior cerebellar artery.  Poor delineation of the left posterior inferior cerebellar artery.  Poor delineation of the right anterior inferior cerebellar artery.  Narrowing superior cerebellar artery bilaterally.  No aneurysm noted.  IMPRESSION: Acute small infarcts scattered throughout the left frontal-parietal lobe.  Prominent small vessel disease type  changes.  Global atrophy without hydrocephalus.  Cervical spondylotic changes as noted above.  Intracranial atherosclerotic type changes as detailed above.  These results will be called to the ordering clinician or representative by the Radiologist Assistant, and communication documented in the PACS Dashboard.  Electronically Signed: By: Chauncey Cruel M.D. On: 08/08/2013 14:44   Mr Brain Wo Contrast  08/08/2013   ADDENDUM REPORT: 08/08/2013 14:49  ADDENDUM: These results were called by telephone at the time of interpretation on 08/08/2013 at 2:48 PM to Dr. Dorian Pod , who verbally acknowledged these results.   Electronically Signed  By: Chauncey Cruel M.D.   On: 08/08/2013 14:49   08/08/2013   CLINICAL DATA:  Not able to move right arm. History of high blood pressure and colon cancer.  EXAM: MRI HEAD WITHOUT CONTRAST  MRA HEAD WITHOUT CONTRAST  TECHNIQUE: Multiplanar, multiecho pulse sequences of the brain and surrounding structures were obtained without intravenous contrast. Angiographic images of the head were obtained using MRA technique without contrast.  COMPARISON:  08/08/2013 CT.  No comparison MR.  FINDINGS: MRI HEAD FINDINGS  Acute small infarcts scattered throughout the left frontal-parietal lobe.  No intracranial hemorrhage.  Prominent small vessel disease type changes.  Remote tiny right cerebellar infarct.  Global atrophy without hydrocephalus.  No intracranial mass lesion noted on this unenhanced exam.  Cervical spondylotic changes with spinal stenosis most prominent C3-4 level. Slight cranial settling of C1 ring and transverse ligament hypertrophy.  Pituitary region, pineal region and orbital structures unremarkable.  Minimal mucosal thickening paranasal sinuses.  MRA HEAD FINDINGS  Cavernous segment internal carotid artery mild narrowing and ectasia.  Mild to moderate right-sided and mild left-sided supraclinoid segment internal carotid artery narrowing.  No high-grade stenosis of the carotid  terminus, M1 segment of the middle cerebral artery or A1 segment of the anterior cerebral artery on either side.  Middle cerebral artery branch vessel irregularity bilaterally with decrease number of visualized left middle cerebral artery branches consistent with patient's acute infarct.  Fetal type contribution to the posterior cerebral artery bilaterally with small caliber basilar artery.  Left vertebral artery is dominant. Right vertebral artery predominantly ends in a right posterior inferior cerebellar artery distribution. Mild to moderate narrowing of portions of the distal right vertebral artery and right posterior inferior cerebellar artery.  Poor delineation of the left posterior inferior cerebellar artery.  Poor delineation of the right anterior inferior cerebellar artery.  Narrowing superior cerebellar artery bilaterally.  No aneurysm noted.  IMPRESSION: Acute small infarcts scattered throughout the left frontal-parietal lobe.  Prominent small vessel disease type changes.  Global atrophy without hydrocephalus.  Cervical spondylotic changes as noted above.  Intracranial atherosclerotic type changes as detailed above.  These results will be called to the ordering clinician or representative by the Radiologist Assistant, and communication documented in the PACS Dashboard.  Electronically Signed: By: Chauncey Cruel M.D. On: 08/08/2013 14:44    Post Admission Physician Evaluation: 1. Functional deficits secondary  to cardioembolic left fronto-parietal infarct. 2. Patient is admitted to receive collaborative, interdisciplinary care between the physiatrist, rehab nursing staff, and therapy team. 3. Patient's level of medical complexity and substantial therapy needs in context of that medical necessity cannot be provided at a lesser intensity of care such as a SNF. 4. Patient has experienced substantial functional loss from his/her baseline which was documented above under the "Functional History" and  "Functional Status" headings.  Judging by the patient's diagnosis, physical exam, and functional history, the patient has potential for functional progress which will result in measurable gains while on inpatient rehab.  These gains will be of substantial and practical use upon discharge  in facilitating mobility and self-care at the household level. 5. Physiatrist will provide 24 hour management of medical needs as well as oversight of the therapy plan/treatment and provide guidance as appropriate regarding the interaction of the two. 6. 24 hour rehab nursing will assist with bladder management, bowel management, safety, skin/wound care, disease management, medication administration, pain management and patient education  and help integrate therapy concepts, techniques,education, etc. 7. PT will assess and treat  for/with: Lower extremity strength, range of motion, stamina, balance, functional mobility, safety, adaptive techniques and equipment, NMR, education.   Goals are: mod I to supervision. 8. OT will assess and treat for/with: ADL's, functional mobility, safety, upper extremity strength, adaptive techniques and equipment, NMR, education.   Goals are: mod I to supervision. 9. SLP will assess and treat for/with: n/a.  Goals are: n/a. 10. Case Management and Social Worker will assess and treat for psychological issues and discharge planning. 11. Team conference will be held weekly to assess progress toward goals and to determine barriers to discharge. 12. Patient will receive at least 3 hours of therapy per day at least 5 days per week. 13. ELOS: 8-10 days       14. Prognosis:  excellent   Medical Problem List and Plan: 1. Embolic left frontoparietal infarct 2. DVT Prophylaxis/Anticoagulation: Subcutaneous Lovenox. Monitor platelet counts and any signs of bleeding 3. Pain Management: Tylenol as needed 4. Neuropsych: This patient is capable of making decisions on her own behalf. 5.  Hypertension/atrial fibrillation. Cardiac rate control. Continue Toprol. 6. Polymyalgia rheumatica. Chronic prednisone 5 mg daily. 7. Hyperlipidemia. Zocor 8. Left lower extremity wound. Silicone foam dressing change every 3 days as needed do not remove Steri-Strips or xeroform placed in the wound bed  Meredith Staggers, MD, [facility]  08/10/2013

## 2013-08-10 NOTE — Interval H&P Note (Signed)
Krystal Hensley was admitted today to Inpatient Rehabilitation with the diagnosis of cardio-emboli  Left CVA.  The patient's history has been reviewed, patient examined, and there is no change in status.  Patient continues to be appropriate for intensive inpatient rehabilitation.  I have reviewed the patient's chart and labs.  Questions were answered to the patient's satisfaction.  Jarquez Mestre T 08/10/2013, 7:11 PM

## 2013-08-10 NOTE — Progress Notes (Signed)
Rehab admissions - Evaluated for possible admission.  I spoke with patient and her daughter.  They are in agreement to inpatient rehab admission.  Bed available and can admit today.  Call me for questions.  #[phone]

## 2013-08-10 NOTE — Progress Notes (Signed)
Stroke Team Progress Note  HISTORY Krystal Hensley is an 80 y.o. female with a past medical history significant for HTN, colon cancer, polymyalgia rheumatica, arthritis, brought in by ambulance as a code stroke 08/08/2013 at 0936 due to acute oonset right arm weakness. Last known well 845 am 08/08/2013. She said that she woke up this am at 0730, was finishing up breakfast and noticed at [place] she could not move her right arm and got a wave of funny feeling. Denies HA, vertigo, double vision, slurred speech, language or vision impairment.  NIHSS 3. Seems to be improving. CT brain showed no acute abnormality.  Of note, patient and family report that she typically has difficulty moving her arms up. Patient was not administerd TPA secondary to low NIHSS, improving in the ED. She was admitted for further evaluation and treatment.  SUBJECTIVE Patient reports intolerance to statins in the past. She is willing to try a low dose every other day.  OBJECTIVE Most recent Vital Signs: Filed Vitals:   08/09/13 1611 08/09/13 1941 08/09/13 2332 08/10/13 0423  BP: 141/78 170/65 168/80 167/82  Pulse: 72 82 72 71  Temp: 97.4 F (36.3 C) 97.9 F (36.6 C) 98.4 F (36.9 C) 97.7 F (36.5 C)  TempSrc: Oral Oral Oral Oral  Resp: 20   21  Height:      Weight:      SpO2: 96% 94% 96% 97%   CBG (last 3)   Recent Labs  08/08/13 0954  GLUCAP 177*    IV Fluid Intake:   . sodium chloride 75 mL/hr at 08/09/13 2331    MEDICATIONS  . aspirin  81 mg Oral Daily  . enoxaparin (LOVENOX) injection  40 mg Subcutaneous Q24H  . metoprolol succinate  50 mg Oral Daily  . metoprolol succinate  75 mg Oral QHS  . predniSONE  5 mg Oral Q breakfast  . simvastatin  10 mg Oral q1800   PRN:  acetaminophen, ondansetron, senna-docusate  Diet:  Cardiac thin liquids Activity:  Bedrest, OOB with assistance DVT Prophylaxis:  Lovenox 40 mg sq daily   CLINICALLY SIGNIFICANT STUDIES Basic Metabolic Panel:   Recent Labs Lab  08/08/13 0942 08/08/13 0953 08/08/13 1141 08/09/13 0326  NA 138 136*  --  140  K 3.9 3.7  --  3.9  CL 98 99  --  103  CO2 24  --   --  25  GLUCOSE 200* 197*  --  91  BUN 9 7  --  8  CREATININE 0.58 0.70 0.51 0.60  CALCIUM 9.0  --   --  8.5   Liver Function Tests:   Recent Labs Lab 08/08/13 0942  AST 26  ALT 15  ALKPHOS 93  BILITOT 0.6  PROT 6.5  ALBUMIN 3.5   CBC:  Recent Labs Lab 08/08/13 0942  08/08/13 1141 08/09/13 0326  WBC 8.9  --  11.0* 8.2  NEUTROABS 5.6  --   --   --   HGB 14.7  < > 14.7 13.7  HCT 42.9  < > 42.4 39.9  MCV 97.9  --  96.6 98.0  PLT 209  --  213 202  < > = values in this interval not displayed. Coagulation:   Recent Labs Lab 08/08/13 0942  LABPROT 11.9  INR 0.89   Cardiac Enzymes:   Recent Labs Lab 08/08/13 0942  TROPONINI <0.30   Urinalysis:   Recent Labs Lab 08/08/13 1135  COLORURINE YELLOW  LABSPEC 1.010  PHURINE 7.0  GLUCOSEU NEGATIVE  HGBUR SMALL*  BILIRUBINUR NEGATIVE  KETONESUR NEGATIVE  PROTEINUR NEGATIVE  UROBILINOGEN 0.2  NITRITE NEGATIVE  LEUKOCYTESUR NEGATIVE   Lipid Panel    Component Value Date/Time   CHOL 216* 08/09/2013 0326   TRIG 116 08/09/2013 0326   HDL 73 08/09/2013 0326   CHOLHDL 3.0 08/09/2013 0326   VLDL 23 08/09/2013 0326   LDLCALC 120* 08/09/2013 0326   HgbA1C  Lab Results  Component Value Date   HGBA1C 5.9* 08/09/2013    Urine Drug Screen:     Component Value Date/Time   LABOPIA NONE DETECTED 08/08/2013 1135   COCAINSCRNUR NONE DETECTED 08/08/2013 1135   LABBENZ NONE DETECTED 08/08/2013 1135   AMPHETMU NONE DETECTED 08/08/2013 1135   THCU NONE DETECTED 08/08/2013 1135   LABBARB NONE DETECTED 08/08/2013 1135    Alcohol Level:   Recent Labs Lab 08/08/13 0942  ETH <11    CT of the brain  08/08/2013    Chronic changes without acute abnormality.  MRI of the brain  08/08/2013  Acute small infarcts scattered throughout the left frontal-parietal lobe.  Prominent small vessel disease type changes.   Global atrophy without hydrocephalus.  Cervical spondylotic changes  MRA of the brain  08/08/2013   Intracranial atherosclerotic type changes   2D Echocardiogram  EF 65-70% with no source of embolus.   Carotid Doppler  No evidence of hemodynamically significant internal carotid artery stenosis. Vertebral artery flow is antegrade.   CXR  08/08/2013    No acute cardiopulmonary disease.    EKG  Sinus tachycardia  Therapy Recommendations CIR  Physical Exam   Pleasant elderly female not in distress.Awake alert. Afebrile. Head is nontraumatic. Neck is supple without bruit. Hearing is normal. Cardiac exam no murmur or gallop. Lungs are clear to auscultation. Distal pulses are well felt. Neurological Exam : Awake alert oriented x 32. Diminished recall and registration. normal speech and language. Mild right lower face asymmetry. Tongue midline. No drift. Mild diminished fine finger movements on right. Orbits left over right upper extremity. Mild right grip weak.. Normal sensation . Normal coordination.  ASSESSMENT Krystal Hensley is a 80 y.o. female presenting with right arm weakness. Imaging confirms left frontal lobe infarcts. Infarcts felt to be embolic secondary to known atrial fibrillation.  On aspirin 81 mg twice a week prior to admission. Now on aspirin 81 mg orally every day for secondary stroke prevention. Patient with resultant mild right sided hemiparesis. Work up completed.  Hypertension Hyperlipidemia, LDL 120, on no statin PTA, now on zocor 10 mg daily, goal LDL < 100 Hx atrial fibrillation , documented in Dr. Bonnita Nasuti Preston's note from 4/1/02001 [facility] day # 2  TREATMENT/PLAN  Not an anticoagulation candidate due to fall risk and bleeding risk   Continue aspirin 81 mg orally every day for secondary stroke prevention. This is a bleeding risk. Patient and family are aware.  Lower zocor dose to 10 mg eery other day and follow for tolerance  Rehab consult in place.  disposition per their eval.  No further stroke workup indicated.  Patient has a 10-15% risk of having another stroke over the next year, the highest risk is within 2 weeks of the most recent stroke/TIA (risk of having a stroke following a stroke or TIA is the same).  Ongoing risk factor control by Primary Care Physician  Stroke Service will sign off. Please call should any needs arise.  Follow up with Dr. Leonie Man, [facility], in 2 months.  Burnetta Sabin, MSN, RN, ANVP-BC, ANP-BC, [facility] Pager: [phone] 08/10/2013 8:23 AM  I have personally obtained a history, examined the patient, evaluated imaging results, and formulated the assessment and plan of care. I agree with the above.  Antony Contras, MD

## 2013-08-10 NOTE — Discharge Summary (Signed)
Physician Discharge Summary  VEVA GRIMLEY ZOX:[id number] DOB: 1917/12/17 DOA: 08/08/2013  PCP: Ginette Otto, MD  Admit date: 08/08/2013 Discharge date: 08/10/2013  Time spent: 35 minutes  Recommendations for Outpatient Follow-up:  1. Follow up with Dr Pearlean Brownie in 1 months.  2. Risk factors modifications.   Discharge Diagnoses:    CVA (cerebral infarction)   HTN (hypertension)   PMR (polymyalgia rheumatica)   Right sided weakness   Wound of left leg   A-fib   Discharge Condition: Stable.   Diet recommendation: Heart Healthy  Filed Weights   08/08/13 1935  Weight: 53.071 kg (117 lb)    History of present illness:   80 y.o. female  With history of atrial fibrillation on a baby aspirin once to twice a week, hypertension, polymyalgia rheumatica and, arthritis who presents to the ED with sudden onset of right-sided weakness and a sensation of imbalance. Patient stated she woke up around 7:30 AM and finished a serial and was trying original her pills when she noted right-sided weakness which was around 8:45 AM. Patient also stated that she felt weak all over with a sensation of imbalance. Patient called her daughter who subsequently called 911 and patient was brought in as a code stroke. Patient denies any facial droop, no slurred speech, no visual changes. Patient denies any fever, no chills, no chest pain, no shortness of breath, no nausea, no vomiting, no abnormal pain, no diarrhea, no dysuria, no bowel or urinary incontinence. Patient denies any numbness or tingling. Patient was seen in the ED NIH score was 3 and a such TPA was not administered. Patient was seen by neurology in the ED head CT which was done was negative.  We were called to admit the patient for further evaluation and management.   Hospital Course:  Acute Left Brain CVA  -Continue ASA for secondary stroke prevention.  -Has a remote h/o a fib; may need to consider anticoagulation. Felt to be high risk for fall.   -ECHO no source of embolism.  -Dopplers: Bilateral: 1-39% ICA stenosis. Vertebral artery flow is antegrade.  -For inpatient rehab.  -Patient willing to try low dose Zocor. 10 mg every other day,.   PMR  -Continue prednisone.   HTN  -Resume HCTZ. Continue with metoprolol.     Procedures: Echo: Left ventricle: The cavity size was normal. Wall thickness was increased in a pattern of mild LVH. Systolic function was vigorous. The estimated ejection fraction was in the range of 65% to 70%. Doppler parameters are consistent with abnormal left ventricular relaxation (grade 1 diastolic dysfunction). - Mitral valve: Mild regurgitation.   Consultations:  Dr Pearlean Brownie.   Discharge Exam: Filed Vitals:   08/10/13 1041  BP: 160/96  Pulse: 82  Temp:   Resp:     General: No distress.  Cardiovascular: S 1, S 2 RRR Respiratory: CTA  Discharge Instructions     Medication List    STOP taking these medications       aspirin EC 81 MG tablet      TAKE these medications       acetaminophen 500 MG tablet  Commonly known as:  TYLENOL  Take 500 mg by mouth 3 (three) times daily as needed.     calcium-vitamin D 500-200 MG-UNIT per tablet  Commonly known as:  OSCAL WITH D  Take 1 tablet by mouth daily.     hydrochlorothiazide 25 MG tablet  Commonly known as:  HYDRODIURIL  Take 25 mg by mouth daily.  lidocaine 5 %  Commonly known as:  LIDODERM  Place 0.5 patches onto the skin daily. Remove & Discard patch within 12 hours or as directed by MD     metoprolol succinate 50 MG 24 hr tablet  Commonly known as:  TOPROL-XL  Take 25-50 mg by mouth 2 (two) times daily. Patient takes 1 tablet in the morning and 1/2 tablet in the evening     OCUVITE-LUTEIN PO  Take 1 tablet by mouth daily.     omeprazole 20 MG tablet  Commonly known as:  PRILOSEC OTC  Take 20 mg by mouth every other day.     predniSONE 5 MG tablet  Commonly known as:  DELTASONE  Take 5 mg by mouth daily.        No Known Allergies     Follow-up Information   Follow up with Forbes Cellar, MD. Schedule an appointment as soon as possible for a visit in 2 months. (stroke clinic)    Specialties:  Neurology, Radiology   Contact information:   [address] [address] [phone]        The results of significant diagnostics from this hospitalization (including imaging, microbiology, ancillary and laboratory) are listed below for reference.    Significant Diagnostic Studies: Dg Chest 2 View  08/08/2013   CLINICAL DATA:  Stroke.  Weakness.  EXAM: CHEST  2 VIEW  COMPARISON:  By 10/24/2012  FINDINGS: Cardiac silhouette is mildly enlarged. Mediastinum is normal in contour. No hilar masses.  Clear lungs. The bony thorax is diffusely demineralized but grossly intact.  IMPRESSION: No acute cardiopulmonary disease.   Electronically Signed   By: Lajean Manes M.D.   On: 08/08/2013 13:00   Ct Head Wo Contrast  08/08/2013   CLINICAL DATA:  Right arm weakness  EXAM: CT HEAD WITHOUT CONTRAST  TECHNIQUE: Contiguous axial images were obtained from the base of the skull through the vertex without intravenous contrast.  COMPARISON:  11/01/2012  FINDINGS: Bony calvarium is intact. No gross soft tissue abnormality is seen. Mild atrophic changes are noted. Chronic white matter ischemic change is seen. No findings to suggest acute hemorrhage, acute infarction or space-occupying mass lesion are noted.  IMPRESSION: Chronic changes without acute abnormality.  These results were called by telephone at the time of interpretation on 08/08/2013 at 10:22 AM to Dr. Serita Grit , who verbally acknowledged these results.   Electronically Signed   By: Inez Catalina M.D.   On: 08/08/2013 10:23   Mr Virgel Paling JK Contrast  08/08/2013   ADDENDUM REPORT: 08/08/2013 14:49  ADDENDUM: These results were called by telephone at the time of interpretation on 08/08/2013 at 2:48 PM to Dr. Dorian Pod , who verbally  acknowledged these results.   Electronically Signed   By: Chauncey Cruel M.D.   On: 08/08/2013 14:49   08/08/2013   CLINICAL DATA:  Not able to move right arm. History of high blood pressure and colon cancer.  EXAM: MRI HEAD WITHOUT CONTRAST  MRA HEAD WITHOUT CONTRAST  TECHNIQUE: Multiplanar, multiecho pulse sequences of the brain and surrounding structures were obtained without intravenous contrast. Angiographic images of the head were obtained using MRA technique without contrast.  COMPARISON:  08/08/2013 CT.  No comparison MR.  FINDINGS: MRI HEAD FINDINGS  Acute small infarcts scattered throughout the left frontal-parietal lobe.  No intracranial hemorrhage.  Prominent small vessel disease type changes.  Remote tiny right cerebellar infarct.  Global atrophy without hydrocephalus.  No intracranial mass  lesion noted on this unenhanced exam.  Cervical spondylotic changes with spinal stenosis most prominent C3-4 level. Slight cranial settling of C1 ring and transverse ligament hypertrophy.  Pituitary region, pineal region and orbital structures unremarkable.  Minimal mucosal thickening paranasal sinuses.  MRA HEAD FINDINGS  Cavernous segment internal carotid artery mild narrowing and ectasia.  Mild to moderate right-sided and mild left-sided supraclinoid segment internal carotid artery narrowing.  No high-grade stenosis of the carotid terminus, M1 segment of the middle cerebral artery or A1 segment of the anterior cerebral artery on either side.  Middle cerebral artery branch vessel irregularity bilaterally with decrease number of visualized left middle cerebral artery branches consistent with patient's acute infarct.  Fetal type contribution to the posterior cerebral artery bilaterally with small caliber basilar artery.  Left vertebral artery is dominant. Right vertebral artery predominantly ends in a right posterior inferior cerebellar artery distribution. Mild to moderate narrowing of portions of the distal right  vertebral artery and right posterior inferior cerebellar artery.  Poor delineation of the left posterior inferior cerebellar artery.  Poor delineation of the right anterior inferior cerebellar artery.  Narrowing superior cerebellar artery bilaterally.  No aneurysm noted.  IMPRESSION: Acute small infarcts scattered throughout the left frontal-parietal lobe.  Prominent small vessel disease type changes.  Global atrophy without hydrocephalus.  Cervical spondylotic changes as noted above.  Intracranial atherosclerotic type changes as detailed above.  These results will be called to the ordering clinician or representative by the Radiologist Assistant, and communication documented in the PACS Dashboard.  Electronically Signed: By: Chauncey Cruel M.D. On: 08/08/2013 14:44   Mr Brain Wo Contrast  08/08/2013   ADDENDUM REPORT: 08/08/2013 14:49  ADDENDUM: These results were called by telephone at the time of interpretation on 08/08/2013 at 2:48 PM to Dr. Dorian Pod , who verbally acknowledged these results.   Electronically Signed   By: Chauncey Cruel M.D.   On: 08/08/2013 14:49   08/08/2013   CLINICAL DATA:  Not able to move right arm. History of high blood pressure and colon cancer.  EXAM: MRI HEAD WITHOUT CONTRAST  MRA HEAD WITHOUT CONTRAST  TECHNIQUE: Multiplanar, multiecho pulse sequences of the brain and surrounding structures were obtained without intravenous contrast. Angiographic images of the head were obtained using MRA technique without contrast.  COMPARISON:  08/08/2013 CT.  No comparison MR.  FINDINGS: MRI HEAD FINDINGS  Acute small infarcts scattered throughout the left frontal-parietal lobe.  No intracranial hemorrhage.  Prominent small vessel disease type changes.  Remote tiny right cerebellar infarct.  Global atrophy without hydrocephalus.  No intracranial mass lesion noted on this unenhanced exam.  Cervical spondylotic changes with spinal stenosis most prominent C3-4 level. Slight cranial settling of C1  ring and transverse ligament hypertrophy.  Pituitary region, pineal region and orbital structures unremarkable.  Minimal mucosal thickening paranasal sinuses.  MRA HEAD FINDINGS  Cavernous segment internal carotid artery mild narrowing and ectasia.  Mild to moderate right-sided and mild left-sided supraclinoid segment internal carotid artery narrowing.  No high-grade stenosis of the carotid terminus, M1 segment of the middle cerebral artery or A1 segment of the anterior cerebral artery on either side.  Middle cerebral artery branch vessel irregularity bilaterally with decrease number of visualized left middle cerebral artery branches consistent with patient's acute infarct.  Fetal type contribution to the posterior cerebral artery bilaterally with small caliber basilar artery.  Left vertebral artery is dominant. Right vertebral artery predominantly ends in a right posterior inferior cerebellar artery distribution. Mild to  moderate narrowing of portions of the distal right vertebral artery and right posterior inferior cerebellar artery.  Poor delineation of the left posterior inferior cerebellar artery.  Poor delineation of the right anterior inferior cerebellar artery.  Narrowing superior cerebellar artery bilaterally.  No aneurysm noted.  IMPRESSION: Acute small infarcts scattered throughout the left frontal-parietal lobe.  Prominent small vessel disease type changes.  Global atrophy without hydrocephalus.  Cervical spondylotic changes as noted above.  Intracranial atherosclerotic type changes as detailed above.  These results will be called to the ordering clinician or representative by the Radiologist Assistant, and communication documented in the PACS Dashboard.  Electronically Signed: By: Chauncey Cruel M.D. On: 08/08/2013 14:44    Microbiology: No results found for this or any previous visit (from the past 240 hour(s)).   Labs: Basic Metabolic Panel:  Recent Labs Lab 08/08/13 0942 08/08/13 0953  08/08/13 1141 08/09/13 0326  NA 138 136*  --  140  K 3.9 3.7  --  3.9  CL 98 99  --  103  CO2 24  --   --  25  GLUCOSE 200* 197*  --  91  BUN 9 7  --  8  CREATININE 0.58 0.70 0.51 0.60  CALCIUM 9.0  --   --  8.5   Liver Function Tests:  Recent Labs Lab 08/08/13 0942  AST 26  ALT 15  ALKPHOS 93  BILITOT 0.6  PROT 6.5  ALBUMIN 3.5   No results found for this basename: LIPASE, AMYLASE,  in the last 168 hours No results found for this basename: AMMONIA,  in the last 168 hours CBC:  Recent Labs Lab 08/08/13 0942 08/08/13 0953 08/08/13 1141 08/09/13 0326  WBC 8.9  --  11.0* 8.2  NEUTROABS 5.6  --   --   --   HGB 14.7 15.6* 14.7 13.7  HCT 42.9 46.0 42.4 39.9  MCV 97.9  --  96.6 98.0  PLT 209  --  213 202   Cardiac Enzymes:  Recent Labs Lab 08/08/13 0942  TROPONINI <0.30   BNP: BNP (last 3 results) No results found for this basename: PROBNP,  in the last 8760 hours CBG:  Recent Labs Lab 08/08/13 0954  GLUCAP 177*       Signed:  Jyasia Markoff  [facility] 08/10/2013, 3:06 PM

## 2013-08-10 NOTE — Progress Notes (Signed)
OT Cancellation Note  Patient Details Name: Krystal Hensley MRN: [id number] DOB: 22-Apr-1918   Cancelled Treatment:    Reason Eval/Treat Not Completed: Other (comment) Pt transferring to CIR. Defer OT to CIR. [place], OTR/L  [phone] 08/10/2013 08/10/2013, 3:37 PM

## 2013-08-11 ENCOUNTER — Inpatient Hospital Stay (HOSPITAL_COMMUNITY): Payer: Medicare Other

## 2013-08-11 ENCOUNTER — Inpatient Hospital Stay (HOSPITAL_COMMUNITY): Payer: Medicare Other | Admitting: Rehabilitation

## 2013-08-11 ENCOUNTER — Inpatient Hospital Stay (HOSPITAL_COMMUNITY): Payer: Medicare Other | Admitting: Occupational Therapy

## 2013-08-11 ENCOUNTER — Inpatient Hospital Stay (HOSPITAL_COMMUNITY): Payer: Medicare Other | Admitting: Physical Therapy

## 2013-08-11 DIAGNOSIS — G8110 Spastic hemiplegia affecting unspecified side: Secondary | ICD-10-CM

## 2013-08-11 DIAGNOSIS — I6330 Cerebral infarction due to thrombosis of unspecified cerebral artery: Secondary | ICD-10-CM

## 2013-08-11 LAB — CBC WITH DIFFERENTIAL/PLATELET
BASOS PCT: 0 % (ref 0–1)
Basophils Absolute: 0 10*3/uL (ref 0.0–0.1)
Eosinophils Absolute: 0.2 10*3/uL (ref 0.0–0.7)
Eosinophils Relative: 2 % (ref 0–5)
HCT: 42.2 % (ref 36.0–46.0)
Hemoglobin: 14.2 g/dL (ref 12.0–15.0)
LYMPHS ABS: 1.3 10*3/uL (ref 0.7–4.0)
Lymphocytes Relative: 14 % (ref 12–46)
MCH: 33 pg (ref 26.0–34.0)
MCHC: 33.6 g/dL (ref 30.0–36.0)
MCV: 98.1 fL (ref 78.0–100.0)
Monocytes Absolute: 1.2 10*3/uL — ABNORMAL HIGH (ref 0.1–1.0)
Monocytes Relative: 13 % — ABNORMAL HIGH (ref 3–12)
NEUTROS PCT: 71 % (ref 43–77)
Neutro Abs: 6.5 10*3/uL (ref 1.7–7.7)
PLATELETS: 205 10*3/uL (ref 150–400)
RBC: 4.3 MIL/uL (ref 3.87–5.11)
RDW: 14.6 % (ref 11.5–15.5)
WBC: 9.2 10*3/uL (ref 4.0–10.5)

## 2013-08-11 LAB — COMPREHENSIVE METABOLIC PANEL
ALBUMIN: 3 g/dL — AB (ref 3.5–5.2)
ALK PHOS: 79 U/L (ref 39–117)
ALT: 15 U/L (ref 0–35)
AST: 27 U/L (ref 0–37)
BUN: 11 mg/dL (ref 6–23)
CO2: 21 mEq/L (ref 19–32)
Calcium: 8.7 mg/dL (ref 8.4–10.5)
Chloride: 105 mEq/L (ref 96–112)
Creatinine, Ser: 0.54 mg/dL (ref 0.50–1.10)
GFR calc Af Amer: >90 mL/min (ref >90)
GFR calc non Af Amer: 79 mL/min — ABNORMAL LOW (ref >90)
Glucose, Bld: 82 mg/dL (ref 70–99)
POTASSIUM: 4.2 meq/L (ref 3.7–5.3)
SODIUM: 140 meq/L (ref 137–147)
Total Bilirubin: 0.7 mg/dL (ref 0.3–1.2)
Total Protein: 6 g/dL (ref 6.0–8.3)

## 2013-08-11 MED ORDER — ACETAMINOPHEN 325 MG PO TABS
650.0000 mg | ORAL_TABLET | Freq: Two times a day (BID) | ORAL | Status: DC
  Administered 2013-08-12 – 2013-08-19 (×15): 650 mg via ORAL
  Filled 2013-08-11 (×15): qty 2

## 2013-08-11 NOTE — Consult Note (Signed)
Late entry [place] wound consult note Reason for Consult: evaluation of LLE wound.  Pt sustained injury at home and required attention in urgent care 08/05/13 At that time steirstrips placed along the lateral edge of the V shaped wound.  2 sutures placed along lateral short axis of the wound.  Skin flap was not able to be salvaged over the entire site, therefore a pc of xeroform was placed over the open portion of wound.   Wound type: skin tear Measurement: 4cm x 9cm x 0.2cm  Wound bed: covered with xeroform, and lateral edge with steristips still in place Drainage (amount, consistency, odor) minimal Periwound:intact with small about of ecchymosis still present, but not acute s/s of infection at this time Dressing procedure/placement/frequency: Leave xeroform in place, leave steristrips in place.  Cover with silicone foam dressing for protection and absorption of any exudate.   Daughter reports sutures are to be removed, but it is unclear at this time of the date for removal.  I will attempt to determine this.  Primary RN at bedside during consultation.    Discussed POC with patient and bedside nurse.  Re consult if needed, will not follow at this time. Thanks  Krystal Hensley [place], [place] [phone])

## 2013-08-11 NOTE — Progress Notes (Signed)
Physical Therapy Session Note  Patient Details  Name: Krystal Hensley MRN: [id number] Date of Birth: 1917-08-03  Today's Date: 08/11/2013 Time: 1015-1056 Time Calculation (min): 41 min  Short Term Goals: Week 1:  PT Short Term Goal 1 (Week 1): STG = LTG due to short LOS  Skilled Therapeutic Interventions/Progress Updates:  Pt received sitting in w/c in room, finishing with PT evaluation.  Pt allowed several minutes to rest prior to beginning therapy.  Pt ambulated 150' to/from therapy gym with use of rollator at min/guard assist.  Provided min cues for ensuring brakes locked prior to standing with rollator, cues for pushing from seated surface when standing.  Note that as pt fatigues, she tends to drag RLE, but can correct with cues for increased hip/knee flex and ankle DF.  Once in gym, performed BERG balance test with score of 19/56.  See details below.  Discussed results of test and how we would work in therapy to improve balance score for safe D/C.  Pt verbalized understanding.  Pt ambulated back to room as mentioned above.  Pt left in w/c in room with all needs in reach.   Therapy Documentation Precautions:  Precautions Precautions: Fall Precaution Comments: right hemiparesis Required Braces or Orthoses: Other Brace/Splint Other Brace/Splint: wears back corset due to back pain - "crooked back" Restrictions Weight Bearing Restrictions: No General: Chart Reviewed: Yes Response to Previous Treatment: Patient reporting fatigue but able to participate. Family/Caregiver Present: No Vital Signs:   Pain: Pain Assessment Pain Assessment: No/denies pain Pain Score: 8  Pain Type: Chronic pain Pain Location: Back Pain Descriptors / Indicators: Aching Pain Intervention(s): Medication (See eMAR)     Balance: Balance Balance Assessed: Yes Standardized Balance Assessment Standardized Balance Assessment: Berg Balance Test Berg Balance Test Sit to Stand: Needs minimal aid to stand or  to stabilize Standing Unsupported: Able to stand 2 minutes with supervision Sitting with Back Unsupported but Feet Supported on Floor or Stool: Able to sit safely and securely 2 minutes Stand to Sit: Uses backs of legs against chair to control descent Transfers: Able to transfer with verbal cueing and /or supervision Standing Unsupported with Eyes Closed: Able to stand 10 seconds with supervision Standing Ubsupported with Feet Together: Needs help to attain position and unable to hold for 15 seconds From Standing, Reach Forward with Outstretched Arm: Loses balance while trying/requires external support From Standing Position, Pick up Object from Floor: Unable to try/needs assist to keep balance From Standing Position, Turn to Look Behind Over each Shoulder: Looks behind one side only/other side shows less weight shift Turn 360 Degrees: Needs assistance while turning Standing Unsupported, Alternately Place Feet on Step/Stool: Needs assistance to keep from falling or unable to try Standing Unsupported, One Foot in Front: Needs help to step but can hold 15 seconds Standing on One Leg: Unable to try or needs assist to prevent fall Total Score: 19   See FIM for current functional status  Therapy/Group: Individual Therapy  Denice Bors 08/11/2013, 12:08 PM

## 2013-08-11 NOTE — Progress Notes (Signed)
Social Work Assessment and Plan Social Work Assessment and Plan  Patient Details  Name: Krystal Hensley MRN: [id number] Date of Birth: 09-10-1917  Today's Date: 08/11/2013  Problem List:  Patient Active Problem List   Diagnosis Date Noted  . CVA (cerebral infarction) 08/08/2013  . RUE weakness 08/08/2013  . HTN (hypertension) 08/08/2013  . PMR (polymyalgia rheumatica) 08/08/2013  . Right sided weakness 08/08/2013  . Wound of left leg 08/08/2013  . A-fib 08/08/2013   Past Medical History:  Past Medical History  Diagnosis Date  . Hypertension   . Arthritis   . Atrial fibrillation   . Back pain   . Cancer     colon  . Polymyalgia rheumatica   . Hypoglycemia   . HTN (hypertension) 08/08/2013  . A-fib 08/08/2013   Past Surgical History:  Past Surgical History  Procedure Laterality Date  . Colon surgery    . Cesarean section    . Bilateral oophorectomy     Social History:  reports that she has never smoked. She does not have any smokeless tobacco history on file. She reports that she does not drink alcohol or use illicit drugs.  Family / Support Systems Marital Status: Widow/Widower Patient Roles: Parent Children: Charlene-"Charlie"- daughter  [phone]  [phone] Other Supports: Krystal Hensley  [phone] Anticipated Caregiver: Daughter can check on pt Ability/Limitations of Caregiver: daughter leaving town for two weeks on 2/14. Caregiver Availability: Intermittent Family Dynamics: Pt reports her daughter she can count on and her neighbor.  She has limited supports-most of my friends are gone.  Difficulty is daughter leaving town for two weeks to help out her daughter while she is out of town for work.    Social History Preferred language: English Religion: Methodist Cultural Background: No issues Education: [employer] Read: Yes Write: Yes Employment Status: Retired Freight forwarder Issues: No issues Guardian/Conservator: None-according  to MD pt is capable of making her own decisions while here   Abuse/Neglect Physical Abuse: Denies Verbal Abuse: Denies Sexual Abuse: Denies Exploitation of patient/patient's resources: Denies Self-Neglect: Denies  Emotional Status Pt's affect, behavior adn adjustment status: Pt is motivated and has a good attitude regarding her progress and motivation to improve.  She states; " You have to keep pushing otherwise why are you here."  She is a remarkable woman who has faced adversity and pushed through and will continue  until God calls her home. Recent Psychosocial Issues: Other medical issues, she has managed and continued her independence Pyschiatric History: No issues deferred depression screen due to pt felt it was not necessary and she was doing well.  She is very encouraged by her progress in a short period of time.  Will monitor her while here and provide support Substance Abuse History: No issues  Patient / Family Perceptions, Expectations & Goals Pt/Family understanding of illness & functional limitations: Pt can explain her stroke and deficits.  She is pleased with her progress thus far and is hopeful she will continue.  Will discuss with daughter when here later today.  Pt concerned about not being able to drive since stroke and how will she get to her hair, medicines and other appointments.  Will research options for pt. Premorbid pt/family roles/activities: Mother, Grandmother, great mother, Church member, friend, etc Anticipated changes in roles/activities/participation: resume Pt/family expectations/goals: Pt states: " I want to be independent again, I live alone."  Daughter states: " My mother is very self sufficient and likes her independence."  [employer]: None  Premorbid Home Care/DME Agencies: Other (Comment) (had [facility] in the past) Transportation available at discharge: Family when able Resource referrals recommended: Support group (specify)  ([facility] SUpport group)  Discharge Planning Living Arrangements: Alone Support Systems: Children;Friends/neighbors;Church/faith community Type of Residence: Private residence Insurance Resources: Education officer, museum (specify) Nurse, mental health) Financial Resources: Social Security Financial Screen Referred: No Living Expenses: Own Money Management: Patient Does the patient have any problems obtaining your medications?: No Home Management: Patient Patient/Family Preliminary Plans: Return home with intermittent assist from daughter.  Needs to be mod/i level to go home.  Daughter leaving to assist daughter wiht her grandson while daughter goes to conference for work.  Will puruse transportaiton resouces and will come up with a safe discharge plan. Social Work Anticipated Follow Up Needs: HH/OP;Support Group;Other (comment) (transportation resources)  Clinical Impression Very motivated pleasant 80 yo lady.  She could do circles around the younger generation.  Very refreshing attitude and look at life.  She is willing to do what she can for herself. Her daughter is leaving town 2/14 and will need to come up with transportation resources and safe discharge plan.  Await teams' evaluations.  Elease Hashimoto 08/11/2013, 1:50 PM

## 2013-08-11 NOTE — Plan of Care (Signed)
Problem: RH Stairs Goal: LTG Patient will ambulate up and down stairs w/assist (PT) LTG: Patient will ambulate up and down # of stairs with assistance (PT) With railings to access home

## 2013-08-11 NOTE — Progress Notes (Signed)
Patient information reviewed and entered into eRehab system by Sonya Pucci, RN, CRRN, PPS Coordinator.  Information including medical coding and functional independence measure will be reviewed and updated through discharge.     Per nursing patient was given "Data Collection Information Summary for Patients in Inpatient Rehabilitation Facilities with attached "Privacy Act Statement-Health Care Records" upon admission.  

## 2013-08-11 NOTE — Evaluation (Signed)
Physical Therapy Assessment and Plan  Patient Details  Name: Krystal Hensley MRN: [id number] Date of Birth: March 25, 1918  PT Diagnosis: Difficulty walking and Hemiparesis dominant Rehab Potential: Excellent ELOS: 7 to 10 days   Today's Date: 08/11/2013 Time: 4097-3532 Time Calculation (min): 60 min  Problem List:  Patient Active Problem List   Diagnosis Date Noted  . CVA (cerebral infarction) 08/08/2013  . RUE weakness 08/08/2013  . HTN (hypertension) 08/08/2013  . PMR (polymyalgia rheumatica) 08/08/2013  . Right sided weakness 08/08/2013  . Wound of left leg 08/08/2013  . A-fib 08/08/2013    Past Medical History:  Past Medical History  Diagnosis Date  . Hypertension   . Arthritis   . Atrial fibrillation   . Back pain   . Cancer     colon  . Polymyalgia rheumatica   . Hypoglycemia   . HTN (hypertension) 08/08/2013  . A-fib 08/08/2013   Past Surgical History:  Past Surgical History  Procedure Laterality Date  . Colon surgery    . Cesarean section    . Bilateral oophorectomy      Assessment & Plan Clinical Impression: Krystal Hensley is a 80 y.o. right-handed female with history of atrial fibrillation maintained on aspirin therapy, hypertension, polymyalgia rheumatica. Patient was modified independent prior to admission using a rolling walker inside the home and a cane for community access and still driving. Admitted 08/08/2013 with acute onset of right-sided weakness. MRI of the brain showed acute small infarcts scattered throughout the left frontal parietal lobe as well as remote tiny right cerebellar infarct. MRA of the head with intracranial atherosclerotic type changes. Carotid Dopplers with no ICA stenosis. Echocardiogram with ejection fraction 99% grade 1 diastolic dysfunction. Patient did not receive TPA. Neurology services consulted presently maintained on aspirin 81 mg daily as well as subcutaneous Lovenox for DVT prophylaxis. Patient is tolerating a regular diet.  Physical therapy evaluation completed 08/09/2013 with recommendations for physical medicine rehabilitation consult to consider inpatient rehabilitation services. Patient was admitted for comprehensive rehabilitation program on 08/10/2013 .   Patient currently requires min with mobility secondary to hemiplegia and decreased balance strategies.  Prior to hospitalization, patient was modified independent  with mobility and lived with Alone in a House home.  Home access is 2Stairs to enter.  Patient will benefit from skilled PT intervention to maximize safe functional mobility, minimize fall risk and decrease caregiver burden for planned discharge home with intermittent assist.  Anticipate patient will benefit from follow up [facility] at discharge.  PT - End of Session Activity Tolerance: Tolerates 30+ min activity with multiple rests PT Assessment Rehab Potential: Excellent Barriers to Discharge: Decreased caregiver support PT Patient demonstrates impairments in the following area(s): Balance;Motor;Safety;Sensory PT Transfers Functional Problem(s): Bed Mobility;Bed to Chair;Car PT Locomotion Functional Problem(s): Ambulation;Stairs PT Plan PT Intensity: Minimum of 1-2 x/day ,45 to 90 minutes PT Frequency: 5 out of 7 days PT Duration Estimated Length of Stay: 7 to 10 days PT Treatment/Interventions: Ambulation/gait training;Balance/vestibular training;Discharge planning;Functional mobility training;Patient/family education;Stair training;Therapeutic Activities;Therapeutic Exercise;UE/LE Strength taining/ROM PT Transfers Anticipated Outcome(s): mod I basic transfers: supervision car PT Locomotion Anticipated Outcome(s): mod I household ambulation and staris to access home; supervision community PT Recommendation Follow Up Recommendations: Home health PT Patient destination: Home Equipment Recommended: None recommended by PT Equipment Details: patient has cane, quad cane, rollator, and rolling  walker  Skilled Therapeutic Intervention Patient resting in bed upon entering room. Patient good historian of events. Patient participated in initial evaluation. Patient ambulated with rollator  150 feet x 3 during session with min guard to close supervision. Patient ambulated up and down 5 steps with bilateral rails and min assist. Patient required cueing for sequencing on steps - up with good, down with bad. Patient ambulated with single point cane and min assist 60 feet. Patient reports using cane in right hand PTA and did not "feel right" using cane in left hand. "Krystal Hensley feels heavy." Patient left in wheelchair in room with all items in reach. Another PT entered for next session.  PT Evaluation Precautions/Restrictions Precautions Precautions: Fall Precaution Comments: right hemiparesis Required Braces or Orthoses: Other Brace/Splint Other Brace/Splint: wears back corset due to back pain - "crooked back" Restrictions Weight Bearing Restrictions: No General Chart Reviewed: Yes Response to Previous Treatment: Patient reporting fatigue but able to participate. Family/Caregiver Present: No Vital Signs  Pain Pain Assessment Pain Assessment: No/denies pain Pain Score: 8  Pain Type: Chronic pain Pain Location: Back Pain Descriptors / Indicators: Aching Pain Intervention(s): Medication (See eMAR) Home Living/Prior Functioning Home Living Available Help at Discharge: Family;Available PRN/intermittently Type of Home: House Home Access: Stairs to enter [employer] of Steps: 2 Entrance Stairs-Rails: Can reach both Home Layout: One level (1 step inside home - raised kitchen/den)  Lives With: Alone Prior Function Level of Independence: Requires assistive device for independence;Independent with homemaking with ambulation  Able to Take Stairs?: Yes Driving: Yes Vocation: Retired Leisure: Hobbies-yes (Comment) Comments: knitting, crochet, crafts Vision/Perception  Vision -  History Baseline Vision: Wears glasses all the time Patient Visual Report: No change from baseline  Cognition Overall Cognitive Status: Within Functional Limits for tasks assessed Arousal/Alertness: Awake/alert Orientation Level: Oriented X4 Memory: Appears intact Awareness: Appears intact Safety/Judgment: Appears intact Sensation Sensation Light Touch: Impaired by gross assessment (pt reports feeling in all 4 extremities - less on right) Stereognosis: Not tested Hot/Cold: Not tested Proprioception: Not tested Coordination Gross Motor Movements are Fluid and Coordinated: Yes (slower gross movement with right extremities) Fine Motor Movements are Fluid and Coordinated: Yes (right slower than left) Finger Nose Finger Test: able to perform with left LE unable to get finger to nose on right Motor  Motor Motor: Hemiplegia Motor - Skilled Clinical Observations: right hemiparesis  Mobility Bed Mobility Bed Mobility: Supine to Sit Supine to Sit: 3: Mod assist;HOB elevated;With rails Supine to Sit Details (indicate cue type and reason): some lifting assist needed at trunk for right side lying to sit Transfers Transfers: Yes Sit to Stand: 4: Min guard Stand Pivot Transfers: 4: Min guard (with rollator) Locomotion  Ambulation Ambulation: Yes Ambulation/Gait Assistance: 4: Min guard Ambulation Distance (Feet): 150 Feet Assistive device: Rollator Gait Gait: Yes Gait Pattern: Decreased hip/knee flexion - right;Decreased stance time - right;Trunk flexed Stairs / Additional Locomotion Stairs: Yes Stairs Assistance: 4: Min assist Stair Management Technique: Two rails;Step to pattern;Forwards Number of Stairs: 5 Height of Stairs: 4   Balance Balance Balance Assessed: Yes Standardized Balance Assessment Standardized Balance Assessment: Berg Balance Test Berg Balance Test Sit to Stand: Needs minimal aid to stand or to stabilize Standing Unsupported: Able to stand 2 minutes with  supervision Sitting with Back Unsupported but Feet Supported on Floor or Stool: Able to sit safely and securely 2 minutes Stand to Sit: Uses backs of legs against chair to control descent Transfers: Able to transfer with verbal cueing and /or supervision Standing Unsupported with Eyes Closed: Able to stand 10 seconds with supervision Standing Ubsupported with Feet Together: Needs help to attain position and unable to hold for  15 seconds From Standing, Reach Forward with Outstretched Arm: Loses balance while trying/requires external support From Standing Position, Pick up Object from Floor: Unable to try/needs assist to keep balance From Standing Position, Turn to Look Behind Over each Shoulder: Looks behind one side only/other side shows less weight shift Turn 360 Degrees: Needs assistance while turning Standing Unsupported, Alternately Place Feet on Step/Stool: Needs assistance to keep from falling or unable to try Standing Unsupported, One Foot in Front: Needs help to step but can hold 15 seconds Standing on One Leg: Unable to try or needs assist to prevent fall Total Score: 19 Dynamic Sitting Balance Sitting balance - Comments: patient able to take resistance all directions in sitting. Patient also able to reach 8 to 10 inches each direction without loss of balance Extremity Assessment  RUE Assessment RUE Assessment: Exceptions to [facility] RUE Strength RUE Overall Strength: Deficits RUE Overall Strength Comments: PROM WFL; shoulder flexion active movement limited to ~ 30 degrees; active elbow flexion/extension against gravity; grasp good LUE Assessment LUE Assessment: Exceptions to [facility] LUE Strength LUE Overall Strength: Deficits;Due to premorbid status LUE Overall Strength Comments: limited shoulder flexion due to poly rhematica; distal ROM and strength WFL's RLE Assessment RLE Assessment: Exceptions to [facility] RLE Strength RLE Overall Strength: Deficits RLE Overall Strength Comments: hip  flexion 3+/5; knee extension/flexion 4/5; dorsiflexion 3+/5 LLE Assessment LLE Assessment: Within Functional Limits  FIM:  FIM - Bed/Chair Transfer Bed/Chair Transfer: 3: Supine > Sit: Mod A (lifting assist/Pt. 50-74%/lift 2 legs;4: Bed > Chair or W/C: Min A (steadying Pt. > 75%);4: Chair or W/C > Bed: Min A (steadying Pt. > 75%) FIM - Locomotion: Wheelchair Locomotion: Wheelchair: 0: Activity did not occur FIM - Locomotion: Ambulation Locomotion: Ambulation Assistive Devices: Administrator (rollator) Ambulation/Gait Assistance: 4: Min guard Locomotion: Ambulation: 4: Travels 150 ft or more with minimal assistance (Pt.>75%) FIM - Locomotion: Stairs Locomotion: Scientist, physiological: Hand rail - 2 Locomotion: Stairs: 2: Up and Down 4 - 11 stairs with minimal assistance (Pt.>75%)   Refer to Care Plan for Long Term Goals  Recommendations for other services: None  Discharge Criteria: Patient will be discharged from PT if patient refuses treatment 3 consecutive times without medical reason, if treatment goals not met, if there is a change in medical status, if patient makes no progress towards goals or if patient is discharged from hospital.  The above assessment, treatment plan, treatment alternatives and goals were discussed and mutually agreed upon: by patient  Sanjuana Letters 08/11/2013, 11:58 AM

## 2013-08-11 NOTE — Progress Notes (Signed)
WOC was approached by daughter of patient while on nursing unit to request I remove her sutures from the leg wound.  I have explained to her that if she goes to rehab I will continue to follow up for removal, if she is discharged to outside rehab facility they will need to obtain orders from the urgent care MD who sutured the wound for a date for removal.  She is in agreement with this plan. Para March RN,CWOCN [phone]

## 2013-08-11 NOTE — Consult Note (Signed)
[facility] nurse spoke with provider at Urgent care that was involved in the repair of the skin tear on the leg.  Identified where stitch placed. OK for [facility] nurse to remove if visible.  Will assess wound today and attempt to remove suture if easily visible.   Judit Awad [place] RN,CWOCN [phone]

## 2013-08-11 NOTE — Evaluation (Signed)
Occupational Therapy Assessment and Plan  Patient Details  Name: Krystal Hensley MRN: [id number] Date of Birth: 1918-06-30  OT Diagnosis: abnormal posture, hemiplegia affecting dominant side, lumbago (low back pain) and muscle weakness (generalized) Rehab Potential: Rehab Potential: Good ELOS: 7-10 days   Today's Date: 08/11/2013 Time: 1102-1202 Time Calculation (min): 60 min  Problem List:  Patient Active Problem List   Diagnosis Date Noted  . CVA (cerebral infarction) 08/08/2013  . RUE weakness 08/08/2013  . HTN (hypertension) 08/08/2013  . PMR (polymyalgia rheumatica) 08/08/2013  . Right sided weakness 08/08/2013  . Wound of left leg 08/08/2013  . A-fib 08/08/2013    Past Medical History:  Past Medical History  Diagnosis Date  . Hypertension   . Arthritis   . Atrial fibrillation   . Back pain   . Cancer     colon  . Polymyalgia rheumatica   . Hypoglycemia   . HTN (hypertension) 08/08/2013  . A-fib 08/08/2013   Past Surgical History:  Past Surgical History  Procedure Laterality Date  . Colon surgery    . Cesarean section    . Bilateral oophorectomy      Assessment & Plan Clinical Impression: Patient is a 80 y.o. right-handed female with history of atrial fibrillation maintained on aspirin therapy, hypertension, polymyalgia rheumatica. Patient was modified independent prior to admission using a rolling walker inside the home and a cane for community access and still driving. Admitted 08/08/2013 with acute onset of right-sided weakness. MRI of the brain showed acute small infarcts scattered throughout the left frontal parietal lobe as well as remote tiny right cerebellar infarct. MRA of the head with intracranial atherosclerotic type changes. Carotid Dopplers with no ICA stenosis. Echocardiogram with ejection fraction 78% grade 1 diastolic dysfunction. Patient did not receive TPA. Neurology services consulted presently maintained on aspirin 81 mg daily as well as  subcutaneous Lovenox for DVT prophylaxis. Patient is tolerating a regular diet. Physical therapy evaluation completed 08/09/2013 with recommendations for physical medicine rehabilitation consult to consider inpatient rehabilitation services.   Patient transferred to CIR on 08/10/2013 .    Patient currently requires min with basic self-care skills secondary to muscle weakness, unbalanced muscle activation and hemiplegia.  Prior to hospitalization, patient could complete ADLs with modified independent .  Patient will benefit from skilled intervention to increase independence with basic self-care skills and increase level of independence with iADL prior to discharge home independently.  Anticipate patient will require intermittent supervision and no further OT follow recommended.  OT - End of Session Activity Tolerance: Tolerates 30+ min activity without fatigue Endurance Deficit: No OT Assessment Rehab Potential: Good OT Patient demonstrates impairments in the following area(s): Balance;Motor;Sensory OT Basic ADL's Functional Problem(s): Grooming;Bathing;Dressing;Toileting OT Advanced ADL's Functional Problem(s): Simple Meal Preparation;Laundry OT Transfers Functional Problem(s): Toilet;Tub/Shower OT Additional Impairment(s): Fuctional Use of Upper Extremity OT Plan OT Intensity: Minimum of 1-2 x/day, 45 to 90 minutes OT Frequency: 5 out of 7 days OT Duration/Estimated Length of Stay: 7-10 days OT Treatment/Interventions: Balance/vestibular training;Discharge planning;DME/adaptive equipment instruction;Neuromuscular re-education;Patient/family education;Psychosocial support;Self Care/advanced ADL retraining;Therapeutic Activities;Therapeutic Exercise;UE/LE Strength taining/ROM;UE/LE Coordination activities OT Self Feeding Anticipated Outcome(s): mod I OT Basic Self-Care Anticipated Outcome(s): mod I OT Toileting Anticipated Outcome(s): mod I OT Bathroom Transfers Anticipated Outcome(s): mod  I OT Recommendation Patient destination: Home Follow Up Recommendations: None Equipment Recommended: None recommended by OT Equipment Details: pt has all necessary DME   Skilled Therapeutic Intervention OT eval initiated, ADL assessment conducted at sit> stand level at sink.  Pt  min assist- close supervision during self-care tasks.  Toilet transfer and toileting conducted with use of Rollator with min assist- close supervision.  Pt attempted walking from sink-bed without AD requiring increased assist (min-mod).  BUE ROM limited due to premorbid polymyalgia according to pt.  LUE shoulder flexion approx 80 degrees and RUE shoulder flexion approx 30 degrees, pt compensates by donning pull over shirts in bed and wears mostly button up shirts.   OT Evaluation Precautions/Restrictions  Precautions Precautions: Fall Precaution Comments: right hemiparesis Required Braces or Orthoses: Other Brace/Splint Other Brace/Splint: wears back corset due to back pain - "crooked back" Restrictions Weight Bearing Restrictions: No Pain Pain Assessment Pain Assessment: No/denies pain Pain Score: 4  Pain Type: Chronic pain Pain Location: Back Pain Descriptors / Indicators: Aching Pain Intervention(s): Medication (See eMAR) Home Living/Prior Functioning Home Living Available Help at Discharge: Family;Available PRN/intermittently Type of Home: House Home Access: Stairs to enter [employer] of Steps: 2 Entrance Stairs-Rails: Can reach both Home Layout: One level (1 step inside home - raised kitchen/den)  Lives With: Alone IADL History Homemaking Responsibilities: Yes Meal Prep Responsibility: Primary (mostly microwave meals) Laundry Responsibility: Primary Cleaning Responsibility: Secondary Prior Function Level of Independence: Requires assistive device for independence;Independent with homemaking with ambulation;Independent with basic ADLs  Able to Take Stairs?: Yes Driving:  Yes Vocation: Retired Leisure: Hobbies-yes (Comment) Comments: knitting, crochet, crafts ADL  See FIM Vision/Perception  Vision - History Baseline Vision: Wears glasses all the time Patient Visual Report: No change from baseline Vision - Assessment Eye Alignment: Within Functional Limits Vision Assessment: Vision not tested  Cognition Overall Cognitive Status: Within Functional Limits for tasks assessed Arousal/Alertness: Awake/alert Orientation Level: Oriented X4 Memory: Appears intact Awareness: Appears intact Safety/Judgment: Appears intact Sensation Sensation Light Touch: Impaired by gross assessment (pt reports feeling in all 4 extremities - less on right) Stereognosis: Not tested Hot/Cold: Not tested Proprioception: Not tested Coordination Gross Motor Movements are Fluid and Coordinated: Yes (slower gross movement with right extremities) Fine Motor Movements are Fluid and Coordinated: Yes (right slower than left) Finger Nose Finger Test: able to perform with LUE, unable to get finger to nose on right due to premorbid polymyalgia in shoulders Motor  Motor Motor: Hemiplegia Motor - Skilled Clinical Observations: right hemiparesis Mobility  Bed Mobility Bed Mobility: Supine to Sit Supine to Sit: 3: Mod assist;HOB elevated;With rails Supine to Sit Details (indicate cue type and reason): some lifting assist needed at trunk for right side lying to sit Transfers Sit to Stand: 4: Min guard  Trunk/Postural Assessment     Balance Balance Balance Assessed: Yes Standardized Balance Assessment Standardized Balance Assessment: Berg Balance Test Berg Balance Test Sit to Stand: Needs minimal aid to stand or to stabilize Standing Unsupported: Able to stand 2 minutes with supervision Sitting with Back Unsupported but Feet Supported on Floor or Stool: Able to sit safely and securely 2 minutes Stand to Sit: Uses backs of legs against chair to control descent Transfers: Able to  transfer with verbal cueing and /or supervision Standing Unsupported with Eyes Closed: Able to stand 10 seconds with supervision Standing Ubsupported with Feet Together: Needs help to attain position and unable to hold for 15 seconds From Standing, Reach Forward with Outstretched Arm: Loses balance while trying/requires external support From Standing Position, Pick up Object from Floor: Unable to try/needs assist to keep balance From Standing Position, Turn to Look Behind Over each Shoulder: Looks behind one side only/other side shows less weight shift Turn 360 Degrees: Needs  assistance while turning Standing Unsupported, Alternately Place Feet on Step/Stool: Needs assistance to keep from falling or unable to try Standing Unsupported, One Foot in Front: Needs help to step but can hold 15 seconds Standing on One Leg: Unable to try or needs assist to prevent fall Total Score: 19 Dynamic Sitting Balance Sitting balance - Comments: patient able to take resistance all directions in sitting. Patient also able to reach 8 to 10 inches each direction without loss of balance Extremity/Trunk Assessment RUE Assessment RUE Assessment: Exceptions to [facility] RUE Strength RUE Overall Strength: Deficits RUE Overall Strength Comments: PROM WFL; shoulder flexion active movement limited to ~ 30 degrees; active elbow flexion/extension against gravity; grasp good LUE Assessment LUE Assessment: Exceptions to [facility] LUE Strength LUE Overall Strength: Deficits;Due to premorbid status LUE Overall Strength Comments: limited shoulder flexion due to poly rhematica; distal ROM and strength WFL's  FIM:  FIM - Grooming Grooming Steps: Wash, rinse, dry face;Wash, rinse, dry hands Grooming: 3: Patient completes 2 of 4 or 3 of 5 steps FIM - Bathing Bathing Steps Patient Completed: Chest;Right Arm;Left Arm;Abdomen;Front perineal area;Buttocks;Right upper leg;Left upper leg Bathing: 4: Min-Patient completes 8-9 [id number] parts or  75+ percent FIM - Upper Body Dressing/Undressing Upper body dressing/undressing steps patient completed: Thread/unthread right sleeve of pullover shirt/dresss;Thread/unthread left sleeve of pullover shirt/dress;Pull shirt over trunk;Thread/unthread right sleeve of front closure shirt/dress;Thread/unthread left sleeve of front closure shirt/dress;Pull shirt around back of front closure shirt/dress;Button/unbutton shirt Upper body dressing/undressing: 4: Min-Patient completed 75 plus % of tasks FIM - Lower Body Dressing/Undressing Lower body dressing/undressing steps patient completed: Thread/unthread right underwear leg;Thread/unthread left underwear leg;Pull underwear up/down;Thread/unthread right pants leg;Thread/unthread left pants leg;Pull pants up/down Lower body dressing/undressing: 3: Mod-Patient completed 50-74% of tasks FIM - Toileting Toileting steps completed by patient: Adjust clothing prior to toileting;Performs perineal hygiene;Adjust clothing after toileting Toileting: 4: Steadying assist FIM - Bed/Chair Transfer Bed/Chair Transfer: 3: Supine > Sit: Mod A (lifting assist/Pt. 50-74%/lift 2 legs;4: Bed > Chair or W/C: Min A (steadying Pt. > 75%);4: Chair or W/C > Bed: Min A (steadying Pt. > 75%) FIM - TRadio producerDevices: Grab bars;Walker Toilet Transfers: 4-To toilet/BSC: Min A (steadying Pt. > 75%);4-From toilet/BSC: Min A (steadying Pt. > 75%)   Refer to Care Plan for Long Term Goals  Recommendations for other services: None  Discharge Criteria: Patient will be discharged from OT if patient refuses treatment 3 consecutive times without medical reason, if treatment goals not met, if there is a change in medical status, if patient makes no progress towards goals or if patient is discharged from hospital.  The above assessment, treatment plan, treatment alternatives and goals were discussed and mutually agreed upon: by patient  HEllwood Dense [facility]2/11/2013, 1:18 PM

## 2013-08-11 NOTE — Progress Notes (Signed)
Physical Therapy Session Note  Patient Details  Name: Krystal Hensley MRN: [id number] Date of Birth: Feb 13, 1918  Today's Date: 08/11/2013 Time: 1332-1410 Time Calculation (min): 38 min  Short Term Goals: Week 1:  PT Short Term Goal 1 (Week 1): STG = LTG due to short LOS  Skilled Therapeutic Interventions/Progress Updates:  Pt resting in bed.  Pt reporting being very willing to participate in therapy but feels that back to back therapies was too much and pt requesting to have therapies spaced apart during the day.  Will alert team.  Pt performed supine > sit with bed rail on flat bed supervision.  Performed sit > stand and gait in controlled environment x 100' x 2 reps with min A while maintaining a conversation about PLOF, falls history and experience with HHPT.  Pt required min cues for path finding to/from gym.  Pt reports two major falls from "carelessness"; reaching too far to pet a dog outside and then inside her hand slipped off of door frame causing her to fall.  Once in gym discussed with pt use of AD inside and outside the house and how she managed the rollator on stairs since she was alone.  Pt reports she mainly used the rollator in the home and was taught by HHPT how to negotiate one step with rollator den <> dining room and when going out in the community she would use the cane so that she could put it in her shopping cart.  Performed stair negotiation training up/down one 6" step with rollator with min-mod A with assistance to balance while pt placed rollator up on platform or down onto floor and verbal cues for safe sequencing (ascend with LLE, descend with RLE).  Transitioned to stair training up/down 3 stairs with SPC in LUE and rail in R with min A and step to sequence.  Returned to room and pt performed sit > supine on flat bed, no rail supervision-min A.    Therapy Documentation Precautions:  Precautions Precautions: Fall Precaution Comments: right hemiparesis Required Braces  or Orthoses: Other Brace/Splint Other Brace/Splint: wears back corset due to back pain - "crooked back" Restrictions Weight Bearing Restrictions: No Pain: Pain Assessment Pain Assessment: No/denies pain Pain Score: 4  Pain Type: Chronic pain Pain Location: Back Pain Descriptors / Indicators: Aching Pain Intervention(s): Medication (See eMAR)  See FIM for current functional status  Therapy/Group: Individual Therapy  Raylene Everts Faucette 08/11/2013, 2:18 PM

## 2013-08-11 NOTE — Care Management Note (Signed)
[facility] Individual Statement of Services  Patient Name:  Krystal Hensley  Date:  08/11/2013  Welcome to the [facility].  Our goal is to provide you with an individualized program based on your diagnosis and situation, designed to meet your specific needs.  With this comprehensive rehabilitation program, you will be expected to participate in at least 3 hours of rehabilitation therapies Monday-Friday, with modified therapy programming on the weekends.  Your rehabilitation program will include the following services:  Physical Therapy (PT), Occupational Therapy (OT), 24 hour per day rehabilitation nursing, Therapeutic Recreaction (TR), Case Management (Social Worker), Rehabilitation Medicine, Nutrition Services and Pharmacy Services  Weekly team conferences will be held on Wednesday to discuss your progress.  Your Social Worker will talk with you frequently to get your input and to update you on team discussions.  Team conferences with you and your family in attendance may also be held.  Expected length of stay: 7-10 days Overall anticipated outcome: mod/i level  Depending on your progress and recovery, your program may change. Your Social Worker will coordinate services and will keep you informed of any changes. Your Social Worker's name and contact numbers are listed  below.  The following services may also be recommended but are not provided by the [facility] will be made to provide these services after discharge if needed.  Arrangements include referral to agencies that provide these services.  Your insurance has been verified to be:  [place] Your primary doctor is:  Dr Lajean Manes  Pertinent information will be shared with your doctor and your insurance company.  Social Worker:  Ovidio Kin, [place] or ([phone]  Information discussed with and copy given to patient by: Elease Hashimoto, 08/11/2013, 12:47 PM

## 2013-08-11 NOTE — Progress Notes (Addendum)
Subjective/Complaints: 80 y.o. right-handed female with history of atrial fibrillation maintained on aspirin therapy, hypertension, polymyalgia rheumatica. Patient was modified independent prior to admission using a rolling walker inside the home and a cane for community access and still driving. Admitted 08/08/2013 with acute onset of right-sided weakness. MRI of the brain showed acute small infarcts scattered throughout the left frontal parietal lobe as well as remote tiny right cerebellar infarct. MRA of the head with intracranial atherosclerotic type changes. Carotid Dopplers with no ICA stenosis. Echocardiogram with ejection fraction 53% grade 1 diastolic dysfunction. Patient did not receive TPA. Neurology services consulted presently maintained on aspirin 81 mg daily as well as subcutaneous Lovenox for DVT prophylaxis. Patient is tolerating a regular diet   Review of Systems - Negative except Hard of hearing Slept well per report.  HOH needs hearing aides Objective: Vital Signs: Blood pressure 155/86, pulse 77, temperature 98.5 F (36.9 C), temperature source Oral, resp. rate 17, SpO2 96.00%. No results found. Results for orders placed during the hospital encounter of 08/10/13 (from the past 72 hour(s))  CBC     Status: None   Collection Time    08/10/13  7:08 PM      Result Value Range   WBC 9.0  4.0 - 10.5 K/uL   RBC 4.21  3.87 - 5.11 MIL/uL   Hemoglobin 13.9  12.0 - 15.0 g/dL   HCT 41.2  36.0 - 46.0 %   MCV 97.9  78.0 - 100.0 fL   MCH 33.0  26.0 - 34.0 pg   MCHC 33.7  30.0 - 36.0 g/dL   RDW 14.4  11.5 - 15.5 %   Platelets 208  150 - 400 K/uL  CREATININE, SERUM     Status: Abnormal   Collection Time    08/10/13  7:08 PM      Result Value Range   Creatinine, Ser 0.54  0.50 - 1.10 mg/dL   GFR calc non Af Amer 79 (*) >90 mL/min   GFR calc Af Amer >90  >90 mL/min   Comment: (NOTE)     The eGFR has been calculated using the CKD EPI equation.     This calculation has not been  validated in all clinical situations.     eGFR's persistently <90 mL/min signify possible Chronic Kidney     Disease.      Physical Exam  Vitals reviewed.  Constitutional: She is oriented to person, place, and time. She appears well-developed.  HENT:  Head: Normocephalic.  Eyes: EOM are normal.  Neck: Normal range of motion. Neck supple. No thyromegaly present.  Cardiovascular:  Cardiac controlled  Respiratory: Effort normal and breath sounds normal. No respiratory distress.  GI: Soft. Bowel sounds are normal. She exhibits no distension.  Neurological: She is alert and oriented to person, place, and time.  Follows full commands  Skin: Skin is warm and dry.  Left ant shin with large abrasion, dark red blood and subq fat exposed  Psychiatric: She has a normal mood and affect.  3-/5 Bilateral delt , 4/5 bi , tri, grip, HF, KE, ankle Decreased Fine motor RUE Assessment/Plan: 1. Functional deficits secondary to Left fronto parietal infarct which require 3+ hours per day of interdisciplinary therapy in a comprehensive inpatient rehab setting. Physiatrist is providing close team supervision and 24 hour management of active medical problems listed below. Physiatrist and rehab team continue to assess barriers to discharge/monitor patient progress toward functional and medical goals. FIM:  FIM - Radio producer Devices: Bedside commode Toilet Transfers: 4-To toilet/BSC: Min A (steadying Pt. > 75%)        Comprehension Comprehension Mode: Auditory Comprehension: 6-Follows complex conversation/direction: With extra time/assistive device  Expression Expression Mode: Verbal Expression: 6-Expresses complex ideas: With extra time/assistive device  Social Interaction Social Interaction: 6-Interacts appropriately with others with medication or extra time (anti-anxiety, antidepressant).  Problem Solving Problem Solving: 6-Solves complex  problems: With extra time  Memory Memory: 6-More than reasonable amt of time  Medical Problem List and Plan:  1. Embolic left frontoparietal infarct  2. DVT Prophylaxis/Anticoagulation: Subcutaneous Lovenox. Monitor platelet counts and any signs of bleeding  3. Pain Management: Tylenol as needed  4. Neuropsych: This patient is capable of making decisions on her own behalf.  5. Hypertension/atrial fibrillation. Cardiac rate control. Continue Toprol.  6. Polymyalgia rheumatica. Chronic prednisone 5 mg daily.  7. Hyperlipidemia. Zocor  8. Left lower extremity wound. Silicone foam dressing change every 3 days as needed do not remove Steri-Strips or xeroform placed in the wound bed   LOS (Days) 1 A FACE TO FACE EVALUATION WAS PERFORMED  KIRSTEINS,ANDREW E 08/11/2013, 7:07 AM

## 2013-08-12 ENCOUNTER — Inpatient Hospital Stay (HOSPITAL_COMMUNITY): Payer: Medicare Other | Admitting: Occupational Therapy

## 2013-08-12 ENCOUNTER — Inpatient Hospital Stay (HOSPITAL_COMMUNITY): Payer: Medicare Other | Admitting: Rehabilitation

## 2013-08-12 ENCOUNTER — Inpatient Hospital Stay (HOSPITAL_COMMUNITY): Payer: Medicare Other | Admitting: Physical Therapy

## 2013-08-12 DIAGNOSIS — I4891 Unspecified atrial fibrillation: Secondary | ICD-10-CM

## 2013-08-12 DIAGNOSIS — I10 Essential (primary) hypertension: Secondary | ICD-10-CM

## 2013-08-12 DIAGNOSIS — M353 Polymyalgia rheumatica: Secondary | ICD-10-CM

## 2013-08-12 DIAGNOSIS — I6340 Cerebral infarction due to embolism of unspecified cerebral artery: Secondary | ICD-10-CM

## 2013-08-12 NOTE — Progress Notes (Signed)
Occupational Therapy Session Note  Patient Details  Name: Krystal Hensley MRN: [id number] Date of Birth: September 19, 1917  Today's Date: 08/12/2013 Time: 4403-4742 Time Calculation (min): 31 min  Skilled Therapeutic Interventions/Progress Updates:    Pt transferred from bed to wheelchair with min assist and then rolled down to the shower/tub room.  Pt educated on use of the tub bench for tub/shower transfers with use of the RW.  Pt was able to perform with min assist and mod instructional cueing.  She stated that she had a small seat already and her son had placed a grab bar on the side of the tub.  Discussed removing this in order to use the tub bench.  Depending on setup she may have to put the seat in facing the rear. Pt already has a hand held shower for use.  Pt ambulated back to the room with a RW instead of the rollator.  She reported having greater difficulty controlling the walker instead of her rollator.  Returned to room and transferred back to bed with min assist at end of session.    Therapy Documentation Precautions:  Precautions Precautions: Fall Precaution Comments: right hemiparesis Required Braces or Orthoses: Other Brace/Splint Other Brace/Splint: wears back corset due to back pain - "crooked back" Restrictions Weight Bearing Restrictions: No  Pain: Pain Assessment Pain Assessment: No/denies pain ADL: See FIM for current functional status  Therapy/Group: Individual Therapy  Jerusalem Brownstein OTR/L 08/12/2013, 3:48 PM

## 2013-08-12 NOTE — Progress Notes (Signed)
Occupational Therapy Session Note  Patient Details  Name: ANZLEY DIBBERN MRN: [id number] Date of Birth: December 27, 1917  Today's Date: 08/12/2013 Time: 1015-1108 Time Calculation (min): 53 min  Short Term Goals: Week 1:  OT Short Term Goal 1 (Week 1): STG = LTGs due to short ELOS  Skilled Therapeutic Interventions/Progress Updates:    Pt seen for ADL retraining with focus on dynamic sitting and standing balance during bathing and dressing at sink.  Pt reports change of dressing to LLE and preferring to wash at sink this session.  Pt min/steady assist while standing to pull up pants and when reaching outside BOS to obtain paper towels.  Pt able to don socks and shoes this session by crossing BLE over opposite knee.  Pt reclined in bed to brush hair, demonstrating ability to lift RUE over head in supine position to allow her to brush hair - pt reports doing this for a while due to decreased ROM in BUE.  Pt reports pain in Lt hip and requesting to rest before next session.  Discussed bathroom setup and plan to increase focus on balance with self-care tasks and higher level ADLs.  Therapy Documentation Precautions:  Precautions Precautions: Fall Precaution Comments: right hemiparesis Required Braces or Orthoses: Other Brace/Splint Other Brace/Splint: wears back corset due to back pain - "crooked back" Restrictions Weight Bearing Restrictions: No Pain: Pain Assessment Pain Assessment: 0-10 Pain Score: 5  Pain Type: Acute pain Pain Location: Leg Pain Orientation: Left;Lower Pain Descriptors / Indicators: Discomfort Pain Intervention(s): RN made aware  See FIM for current functional status  Therapy/Group: Individual Therapy  Simonne Come 08/12/2013, 11:37 AM

## 2013-08-12 NOTE — Consult Note (Signed)
WOC wound follow up Wound type: skin tear with repair in the Urgent care center 08/05/13.   Measurement: 4cm x 7cm x 0.2cm  Wound bed: the open wound is covered with xeroform per the urgent care provider and their are 4 steristrips on the lateral side of the wound.  I discussed in length a single stitch that had been placed at the time of the attempted reaproximation of the skin flap, it is actually under the steristrip.  I have decided to leave the stitch in place for right now, the wound is quite moist today and a little more exudative.  I have added additional xeroform to attempt to dry out the wound bed a bit and covered with non adherent instead of foam.  The skin flap does appear very dusky today and I fear we may loose this flap at some point.  Will try to dry out a bit and see from there, will reassess the wound on Monday.  Drainage (amount, consistency, odor) moderate, non purulent, serosanguinous  Periwound: intact but with ecchymosis and noted staining of the bilateral LEs Dressing procedure/placement/frequency: Leave xeroform in place (in the wound bed) and leave steri strips in place.  Cover with additional piece of xeroform and non adherent, wrap with kerlix. Change topper daily.  WOC will follow along with you for wound assessments and update of wound care as needed.  [place], [place]

## 2013-08-12 NOTE — Progress Notes (Signed)
Subjective/Complaints: 80 y.o. right-handed female with history of atrial fibrillation maintained on aspirin therapy, hypertension, polymyalgia rheumatica. Patient was modified independent prior to admission using a rolling walker inside the home and a cane for community access and still driving. Admitted 08/08/2013 with acute onset of right-sided weakness. MRI of the brain showed acute small infarcts scattered throughout the left frontal parietal lobe as well as remote tiny right cerebellar infarct. MRA of the head with intracranial atherosclerotic type changes. Carotid Dopplers with no ICA stenosis. Echocardiogram with ejection fraction 99% grade 1 diastolic dysfunction. Patient did not receive TPA. Neurology services consulted presently maintained on aspirin 81 mg daily as well as subcutaneous Lovenox for DVT prophylaxis. Patient is tolerating a regular diet   Review of Systems - Negative except Hard of hearing No new complaints. A little anxious about her recovery--wants to return to her independent level of before. Had questions about her sq heparin.  Objective: Vital Signs: Blood pressure 161/82, pulse 86, temperature 97.8 F (36.6 C), temperature source Oral, resp. rate 18, SpO2 95.00%. No results found. Results for orders placed during the hospital encounter of 08/10/13 (from the past 72 hour(s))  CBC     Status: None   Collection Time    08/10/13  7:08 PM      Result Value Range   WBC 9.0  4.0 - 10.5 K/uL   RBC 4.21  3.87 - 5.11 MIL/uL   Hemoglobin 13.9  12.0 - 15.0 g/dL   HCT 41.2  36.0 - 46.0 %   MCV 97.9  78.0 - 100.0 fL   MCH 33.0  26.0 - 34.0 pg   MCHC 33.7  30.0 - 36.0 g/dL   RDW 14.4  11.5 - 15.5 %   Platelets 208  150 - 400 K/uL  CREATININE, SERUM     Status: Abnormal   Collection Time    08/10/13  7:08 PM      Result Value Range   Creatinine, Ser 0.54  0.50 - 1.10 mg/dL   GFR calc non Af Amer 79 (*) >90 mL/min   GFR calc Af Amer >90  >90 mL/min   Comment: (NOTE)    The eGFR has been calculated using the CKD EPI equation.     This calculation has not been validated in all clinical situations.     eGFR's persistently <90 mL/min signify possible Chronic Kidney     Disease.  CBC WITH DIFFERENTIAL     Status: Abnormal   Collection Time    08/11/13  7:20 AM      Result Value Range   WBC 9.2  4.0 - 10.5 K/uL   RBC 4.30  3.87 - 5.11 MIL/uL   Hemoglobin 14.2  12.0 - 15.0 g/dL   HCT 42.2  36.0 - 46.0 %   MCV 98.1  78.0 - 100.0 fL   MCH 33.0  26.0 - 34.0 pg   MCHC 33.6  30.0 - 36.0 g/dL   RDW 14.6  11.5 - 15.5 %   Platelets 205  150 - 400 K/uL   Neutrophils Relative % 71  43 - 77 %   Neutro Abs 6.5  1.7 - 7.7 K/uL   Lymphocytes Relative 14  12 - 46 %   Lymphs Abs 1.3  0.7 - 4.0 K/uL   Monocytes Relative 13 (*) 3 - 12 %   Monocytes Absolute 1.2 (*) 0.1 - 1.0 K/uL   Eosinophils Relative 2  0 - 5 %   Eosinophils Absolute 0.2  0.0 - 0.7 K/uL   Basophils Relative 0  0 - 1 %   Basophils Absolute 0.0  0.0 - 0.1 K/uL  COMPREHENSIVE METABOLIC PANEL     Status: Abnormal   Collection Time    08/11/13  7:20 AM      Result Value Range   Sodium 140  137 - 147 mEq/L   Potassium 4.2  3.7 - 5.3 mEq/L   Chloride 105  96 - 112 mEq/L   CO2 21  19 - 32 mEq/L   Glucose, Bld 82  70 - 99 mg/dL   BUN 11  6 - 23 mg/dL   Creatinine, Ser 0.54  0.50 - 1.10 mg/dL   Calcium 8.7  8.4 - 10.5 mg/dL   Total Protein 6.0  6.0 - 8.3 g/dL   Albumin 3.0 (*) 3.5 - 5.2 g/dL   AST 27  0 - 37 U/L   ALT 15  0 - 35 U/L   Alkaline Phosphatase 79  39 - 117 U/L   Total Bilirubin 0.7  0.3 - 1.2 mg/dL   GFR calc non Af Amer 79 (*) >90 mL/min   GFR calc Af Amer >90  >90 mL/min   Comment: (NOTE)     The eGFR has been calculated using the CKD EPI equation.     This calculation has not been validated in all clinical situations.     eGFR's persistently <90 mL/min signify possible Chronic Kidney     Disease.      Physical Exam  Vitals reviewed.  Constitutional: She is oriented to  person, place, and time. She appears well-developed.  HENT:  Head: Normocephalic.  Eyes: EOM are normal.  Neck: Normal range of motion. Neck supple. No thyromegaly present.  Cardiovascular:  Cardiac controlled  Respiratory: Effort normal and breath sounds normal. No respiratory distress.  GI: Soft. Bowel sounds are normal. She exhibits no distension.  Neurological: She is alert and oriented to person, place, and time.  Follows full commands  Skin: Skin is warm and dry.  Left ant shin with large abrasion, dark red blood and subq fat exposed--superficial layer of necrotic tissue--appears clean Psychiatric: She has a normal mood and affect.  3-/5 Bilateral delt , 4/5 bi , tri, grip, HF, KE, ankle Decreased Fine motor RUE  Assessment/Plan: 1. Functional deficits secondary to Left fronto parietal infarct which require 3+ hours per day of interdisciplinary therapy in a comprehensive inpatient rehab setting. Physiatrist is providing close team supervision and 24 hour management of active medical problems listed below. Physiatrist and rehab team continue to assess barriers to discharge/monitor patient progress toward functional and medical goals. FIM: FIM - Bathing Bathing Steps Patient Completed: Chest;Right Arm;Left Arm;Abdomen;Front perineal area;Buttocks;Right upper leg;Left upper leg Bathing: 4: Min-Patient completes 8-9 [id number] parts or 75+ percent  FIM - Upper Body Dressing/Undressing Upper body dressing/undressing steps patient completed: Thread/unthread right sleeve of pullover shirt/dresss;Thread/unthread left sleeve of pullover shirt/dress;Pull shirt over trunk;Thread/unthread right sleeve of front closure shirt/dress;Thread/unthread left sleeve of front closure shirt/dress;Pull shirt around back of front closure shirt/dress;Button/unbutton shirt Upper body dressing/undressing: 4: Min-Patient completed 75 plus % of tasks FIM - Lower Body Dressing/Undressing Lower body  dressing/undressing steps patient completed: Thread/unthread right underwear leg;Thread/unthread left underwear leg;Pull underwear up/down;Thread/unthread right pants leg;Thread/unthread left pants leg;Pull pants up/down Lower body dressing/undressing: 3: Mod-Patient completed 50-74% of tasks  FIM - Toileting Toileting steps completed by patient: Adjust clothing prior to toileting;Performs perineal hygiene;Adjust clothing after toileting Toileting: 4: Steadying assist  FIM -  Radio producer Devices: Grab bars;Walker Toilet Transfers: 4-To toilet/BSC: Min A (steadying Pt. > 75%);4-From toilet/BSC: Min A (steadying Pt. > 75%)  FIM - Bed/Chair Transfer Bed/Chair Transfer: 3: Supine > Sit: Mod A (lifting assist/Pt. 50-74%/lift 2 legs;4: Bed > Chair or W/C: Min A (steadying Pt. > 75%);4: Chair or W/C > Bed: Min A (steadying Pt. > 75%)  FIM - Locomotion: Wheelchair Locomotion: Wheelchair: 0: Activity did not occur FIM - Locomotion: Ambulation Locomotion: Ambulation Assistive Devices: Administrator (rollator) Ambulation/Gait Assistance: 4: Min guard Locomotion: Ambulation: 4: Travels 150 ft or more with minimal assistance (Pt.>75%)  Comprehension Comprehension Mode: Auditory Comprehension: 6-Follows complex conversation/direction: With extra time/assistive device  Expression Expression Mode: Verbal Expression: 6-Expresses complex ideas: With extra time/assistive device  Social Interaction Social Interaction: 6-Interacts appropriately with others with medication or extra time (anti-anxiety, antidepressant).  Problem Solving Problem Solving: 5-Solves complex 90% of the time/cues < 10% of the time  Memory Memory: 6-Assistive device: No helper  Medical Problem List and Plan:  1. Embolic left frontoparietal infarct  2. DVT Prophylaxis/Anticoagulation: Subcutaneous Lovenox. Monitor platelet counts and any signs of bleeding  3. Pain Management: Tylenol as  needed  4. Neuropsych: This patient is capable of making decisions on her own behalf.  5. Hypertension/atrial fibrillation. Cardiac rate control. Continue Toprol.  6. Polymyalgia rheumatica. Chronic prednisone 5 mg daily.  7. Hyperlipidemia. Zocor  8. Left lower extremity wound. Silicone foam dressing change every 3 days as needed     -Steri-Strips or xeroform placed in the wound bed   LOS (Days) 2 A FACE TO FACE EVALUATION WAS PERFORMED  SWARTZ,ZACHARY T 08/12/2013, 9:40 AM

## 2013-08-12 NOTE — Progress Notes (Signed)
Physical Therapy Session Note  Patient Details  Name: Krystal Hensley MRN: [id number] Date of Birth: 1918-03-05  Today's Date: 08/12/2013 Time: 7106-2694 Time Calculation (min): 43 min  Short Term Goals: Week 1:  PT Short Term Goal 1 (Week 1): STG = LTG due to short LOS  Skilled Therapeutic Interventions/Progress Updates:   Pt received lying in bed this morning, agreeable to therapy, however concerned that her new bandage on her LLE would continue to fall down.  Applied more Kerlex to pts leg in slightly tighter fashion to ensure bandage remained in place during session.  Ambulated >150' to/from gym, ortho gym, and ADL apt with rollator at close supervision level with min cues for closer position to rollator, increased hip/knee and DF to clear R foot when ambulating, and for upright posture.  Once in ADL apt, had pt perform bed mobility to better simulate home environment, as it was noted that she had increased difficulty in room.  She was able to perform at supervision level with very min cues for technique.  Discussed pts bathroom set up and that she was going to have a rail installed by beside to assist with standing at night.  Ambulated to ortho gym in order to perform car transfer.  Upon backing up to car, pt bumped R posterior distal part of leg on metal piece of car, tearing skin open.  Applied gauze and roll of kerlix to maintain pressure.  Ambulated back to room and notified RN of issue.  RN states that dressing is okay and that she will check on it after session.  Performed high level gait in hallway x 30' x 2 ambulating forwards without AD, side to side, and backwards walking.  Requires cues for upright posture and maintaining R foot in straight alignment.  Once back in room, assisted pt into restroom using SPC at min assist level with cues for correct sequencing/technique.  Pt able to use grab bar and perform standing, adjusting clothes and peri care all at supervision.  Pt returned to w/c  to sit up for lunch.  All needs in reach.   Therapy Documentation Precautions:  Precautions Precautions: Fall Precaution Comments: right hemiparesis Required Braces or Orthoses: Other Brace/Splint Other Brace/Splint: wears back corset due to back pain - "crooked back" Restrictions Weight Bearing Restrictions: No   Pain: Pain Assessment Pain Assessment: 0-10 Pain Score: 5  Pain Type: Acute pain Pain Location: Leg Pain Orientation: Left;Lower Pain Descriptors / Indicators: Discomfort Pain Intervention(s): RN made aware   Locomotion : Ambulation Ambulation/Gait Assistance: 5: Supervision   See FIM for current functional status  Therapy/Group: Individual Therapy  Denice Bors 08/12/2013, 1:14 PM

## 2013-08-12 NOTE — IPOC Note (Signed)
Overall Plan of Care [facility]) Patient Details Name: Krystal Hensley MRN: [id number] DOB: 02-18-1918  Admitting Diagnosis: L CVA  Hospital Problems: Active Problems:   CVA (cerebral infarction)     Functional Problem List: Nursing Endurance;Medication Management;Perception;Safety;Skin Integrity  PT Balance;Motor;Safety;Sensory  OT Balance;Motor;Sensory  SLP    TR         Basic ADL's: OT Grooming;Bathing;Dressing;Toileting     Advanced  ADL's: OT Simple Meal Preparation;Laundry     Transfers: PT Bed Mobility;Bed to Chair;Car  OT Toilet;Tub/Shower     Locomotion: PT Ambulation;Stairs     Additional Impairments: OT Fuctional Use of Upper Extremity  SLP        TR      Anticipated Outcomes Item Anticipated Outcome  Self Feeding mod I  Swallowing      Basic self-care  mod I  Toileting  mod I   Bathroom Transfers mod I  Bowel/Bladder  manage b/b with mod I. remain cont. of b/b  Transfers  mod I basic transfers: supervision car  Locomotion  mod I household ambulation and staris to access home; supervision community  Communication     Cognition     Pain  2 or less oput of 10  Safety/Judgment  Mod I   Therapy Plan: PT Intensity: Minimum of 1-2 x/day ,45 to 90 minutes PT Frequency: 5 out of 7 days PT Duration Estimated Length of Stay: 7 to 10 days OT Intensity: Minimum of 1-2 x/day, 45 to 90 minutes OT Frequency: 5 out of 7 days OT Duration/Estimated Length of Stay: 7-10 days         Team Interventions: Nursing Interventions Patient/Family Education;Disease Management/Prevention;Medication Management;Skin Care/Wound Management  PT interventions Ambulation/gait training;Balance/vestibular training;Discharge planning;Functional mobility training;Patient/family education;Stair training;Therapeutic Activities;Therapeutic Exercise;UE/LE Strength taining/ROM  OT Interventions Balance/vestibular training;Discharge planning;DME/adaptive equipment  instruction;Neuromuscular re-education;Patient/family education;Psychosocial support;Self Care/advanced ADL retraining;Therapeutic Activities;Therapeutic Exercise;UE/LE Strength taining/ROM;UE/LE Coordination activities  SLP Interventions    TR Interventions    SW/CM Interventions Discharge Planning;Psychosocial Support;Patient/Family Education    Team Discharge Planning: Destination: PT-Home ,OT- Home , SLP-  Projected Follow-up: PT-Home health PT, OT-  None, SLP-  Projected Equipment Needs: PT-None recommended by PT, OT- None recommended by OT, SLP-  Equipment Details: PT-patient has cane, quad cane, rollator, and rolling walker, OT-pt has all necessary DME Patient/family involved in discharge planning: PT- Patient,  OT-Patient, SLP-   MD ELOS: 7-10 days Medical Rehab Prognosis:  Excellent Assessment: The patient has been admitted for CIR therapies. The team will be addressing, functional mobility, strength, stamina, balance, safety, adaptive techniques/equipment, self-care, bowel and bladder mgt, patient and caregiver education, NMR, pain mgt. Goals have been set at mod I for mobility and self-care.    Meredith Staggers, MD, FAAPMR      See Team Conference Notes for weekly updates to the plan of care

## 2013-08-12 NOTE — Progress Notes (Addendum)
Physical Therapy Session Note  Patient Details  Name: Krystal Hensley MRN: [id number] Date of Birth: 20-Oct-1917  Today's Date: 08/12/2013 Time: 0906-1004 Time Calculation (min): 58 min  Short Term Goals: Week 1:  PT Short Term Goal 1 (Week 1): STG = LTG due to short LOS  Skilled Therapeutic Interventions/Progress Updates:  Pt reporting not sleeping well last night and pain in L lower leg today secondary to wound dressing change; RN notified.  While pt receiving medication assisted pt with donning low back brace in standing, socks and shoes.  Performed gait with rollator from home x 100' x 2 reps with close supervision/min A and min verbal cues for path finding.  Reviewed one step negotiation with rollator from home and stair negotiation with RUE support on rail and LUE support with SPC from home x 2 reps each with min A and max verbal cues to recall safe sequence; pt reporting having increased memory problems this am.  Performed OTAGO standing HEP with bilat support on back of chair for LE strengthening and balance with min A while performing 10 reps each heel lifts, toe lifts, hip flexion, hip ABD, hip extension and HS curls with intermittent sitting rest breaks.  Returned to room with rollator with supervision-min A.  Dressing over LLE wound noted to have fallen leaving wound exposed.  LLE elevated on chair for edema management and RN notified of dressing.    Therapy Documentation Precautions:  Precautions Precautions: Fall Precaution Comments: right hemiparesis Required Braces or Orthoses: Other Brace/Splint Other Brace/Splint: wears back corset due to back pain - "crooked back" Restrictions Weight Bearing Restrictions: No Pain: Pain Assessment Pain Assessment: 0-10 Pain Score: 5  Pain Type: Acute pain Pain Location: Leg Pain Orientation: Left;Lower Pain Descriptors / Indicators: Discomfort Pain Intervention(s): RN made aware  See FIM for current functional  status  Therapy/Group: Individual Therapy  Raylene Everts [facility] 08/12/2013, 9:46 AM

## 2013-08-13 ENCOUNTER — Inpatient Hospital Stay (HOSPITAL_COMMUNITY): Payer: Medicare Other | Admitting: *Deleted

## 2013-08-13 DIAGNOSIS — I4891 Unspecified atrial fibrillation: Secondary | ICD-10-CM

## 2013-08-13 DIAGNOSIS — I6350 Cerebral infarction due to unspecified occlusion or stenosis of unspecified cerebral artery: Secondary | ICD-10-CM

## 2013-08-13 NOTE — Progress Notes (Signed)
Occupational Therapy Note  Patient Details  Name: Krystal Hensley MRN: [id number] Date of Birth: 08/08/1917 Today's Date: 08/13/2013  Time:  0940-1015  (40 min) Pain:  none Individual session  Engaged in bathing and dressing at sink level  Per patient's request.  Addressed bed mobility, sit to stand, standing balance, functional mobility, transfers.   Pt was supervision with bed mobility,  Stood at sink to bathe and sat EOB to dress.  Pt. Complained of right eye not being right.  She stated she thinks there is something on her glasses  But there is nothing.  Pt. amublated to toilet and was supervision with peri care and transfer.  Ambulated with Rolator walker to bed and was supervision to go from sitting to supine.  Left pt inbed with bed alarm on and call bell,phone within reach.       Lisa Roca 08/13/2013, 10:16 AM

## 2013-08-13 NOTE — Progress Notes (Signed)
Patient ID: Krystal Hensley, female   DOB: [birth year], 80 y.o.   MRN: [id number]   08/13/13.  Subjective/Complaints:  80 y.o. right-handed female with history of atrial fibrillation maintained on aspirin therapy, hypertension, polymyalgia rheumatica. Patient was  independent prior to admission using a rolling walker inside the home and a cane for community access and still driving. Admitted 08/08/2013 with acute onset of right-sided weakness. MRI of the brain showed acute small infarcts scattered throughout the left frontal parietal lobe as well as remote tiny right cerebellar infarct. MRA of the head with intracranial atherosclerotic type changes. Carotid Dopplers with no ICA stenosis. Echocardiogram with ejection fraction 40% grade 1 diastolic dysfunction. Patient did not receive TPA. Neurology services consulted presently maintained on aspirin 81 mg daily as well as subcutaneous Lovenox for DVT prophylaxis. Patient is tolerating a regular diet   Review of Systems - Negative except Hard of hearing No new complaints. A little anxious about her recovery--wants to return to her independent level of before.   Objective: Vital Signs: Blood pressure 161/82, pulse 71, temperature 97.6 F (36.4 C), temperature source Oral, resp. rate 16, SpO2 94.00%. No results found. Results for orders placed during the hospital encounter of 08/10/13 (from the past 72 hour(s))  CBC     Status: None   Collection Time    08/10/13  7:08 PM      Result Value Range   WBC 9.0  4.0 - 10.5 K/uL   RBC 4.21  3.87 - 5.11 MIL/uL   Hemoglobin 13.9  12.0 - 15.0 g/dL   HCT 41.2  36.0 - 46.0 %   MCV 97.9  78.0 - 100.0 fL   MCH 33.0  26.0 - 34.0 pg   MCHC 33.7  30.0 - 36.0 g/dL   RDW 14.4  11.5 - 15.5 %   Platelets 208  150 - 400 K/uL  CREATININE, SERUM     Status: Abnormal   Collection Time    08/10/13  7:08 PM      Result Value Range   Creatinine, Ser 0.54  0.50 - 1.10 mg/dL   GFR calc non Af Amer 79 (*) >90 mL/min   GFR calc Af Amer >90  >90 mL/min   Comment: (NOTE)     The eGFR has been calculated using the CKD EPI equation.     This calculation has not been validated in all clinical situations.     eGFR's persistently <90 mL/min signify possible Chronic Kidney     Disease.  CBC WITH DIFFERENTIAL     Status: Abnormal   Collection Time    08/11/13  7:20 AM      Result Value Range   WBC 9.2  4.0 - 10.5 K/uL   RBC 4.30  3.87 - 5.11 MIL/uL   Hemoglobin 14.2  12.0 - 15.0 g/dL   HCT 42.2  36.0 - 46.0 %   MCV 98.1  78.0 - 100.0 fL   MCH 33.0  26.0 - 34.0 pg   MCHC 33.6  30.0 - 36.0 g/dL   RDW 14.6  11.5 - 15.5 %   Platelets 205  150 - 400 K/uL   Neutrophils Relative % 71  43 - 77 %   Neutro Abs 6.5  1.7 - 7.7 K/uL   Lymphocytes Relative 14  12 - 46 %   Lymphs Abs 1.3  0.7 - 4.0 K/uL   Monocytes Relative 13 (*) 3 - 12 %   Monocytes Absolute 1.2 (*) 0.1 - 1.0  K/uL   Eosinophils Relative 2  0 - 5 %   Eosinophils Absolute 0.2  0.0 - 0.7 K/uL   Basophils Relative 0  0 - 1 %   Basophils Absolute 0.0  0.0 - 0.1 K/uL  COMPREHENSIVE METABOLIC PANEL     Status: Abnormal   Collection Time    08/11/13  7:20 AM      Result Value Range   Sodium 140  137 - 147 mEq/L   Potassium 4.2  3.7 - 5.3 mEq/L   Chloride 105  96 - 112 mEq/L   CO2 21  19 - 32 mEq/L   Glucose, Bld 82  70 - 99 mg/dL   BUN 11  6 - 23 mg/dL   Creatinine, Ser 0.54  0.50 - 1.10 mg/dL   Calcium 8.7  8.4 - 10.5 mg/dL   Total Protein 6.0  6.0 - 8.3 g/dL   Albumin 3.0 (*) 3.5 - 5.2 g/dL   AST 27  0 - 37 U/L   ALT 15  0 - 35 U/L   Alkaline Phosphatase 79  39 - 117 U/L   Total Bilirubin 0.7  0.3 - 1.2 mg/dL   GFR calc non Af Amer 79 (*) >90 mL/min   GFR calc Af Amer >90  >90 mL/min   Comment: (NOTE)     The eGFR has been calculated using the CKD EPI equation.     This calculation has not been validated in all clinical situations.     eGFR's persistently <90 mL/min signify possible Chronic Kidney     Disease.    Patient Vitals for the  past 24 hrs:  BP Temp Temp src Pulse Resp SpO2  08/13/13 0604 161/82 mmHg 97.6 F (36.4 C) Oral 71 16 94 %  08/12/13 2058 148/82 mmHg - - 80 - -  08/12/13 1500 145/79 mmHg 97 F (36.1 C) Oral 78 17 94 %     Intake/Output Summary (Last 24 hours) at 08/13/13 0940 Last data filed at 08/12/13 1800  Gross per 24 hour  Intake    600 ml  Output      0 ml  Net    600 ml      Physical Exam  Vitals reviewed.  Constitutional: She is oriented to person, place, and time. She appears well-developed.  HENT:  Head: Normocephalic.  Eyes: EOM are normal.  Neck: Normal range of motion. Neck supple. No thyromegaly present.  Cardiovascular:  Cardiac controlled  Respiratory: Effort normal and breath sounds normal. No respiratory distress.  GI: Soft. Bowel sounds are normal. She exhibits no distension.  Neurological: She is alert and oriented to person, place, and time.  Follows full commands  Skin: Skin is warm and dry.  Wounds both LE wrapped     Medical Problem List and Plan:  1. Embolic left frontoparietal infarct  2. Hypertension/atrial fibrillation. Cardiac rate control. Continue Toprol.  3. Polymyalgia rheumatica. Chronic prednisone 5 mg daily.  4. Hyperlipidemia. Zocor  5. Left lower extremity wound. Silicone foam dressing change every 3 days as needed     -Steri-Strips or xeroform placed in the wound bed   LOS (Days) 3 A FACE TO FACE EVALUATION WAS PERFORMED  Nyoka Cowden 08/13/2013, 9:39 AM

## 2013-08-14 ENCOUNTER — Inpatient Hospital Stay (HOSPITAL_COMMUNITY): Payer: Medicare Other | Admitting: Physical Therapy

## 2013-08-14 ENCOUNTER — Inpatient Hospital Stay (HOSPITAL_COMMUNITY): Payer: Medicare Other | Admitting: *Deleted

## 2013-08-14 DIAGNOSIS — I10 Essential (primary) hypertension: Secondary | ICD-10-CM

## 2013-08-14 NOTE — Progress Notes (Signed)
Patient ID: Krystal Hensley, female   DOB: 1918/03/24, 80 y.o.   MRN: [id number]  Patient ID: Krystal Hensley, female   DOB: Nov 13, 1917, 80 y.o.   MRN: [id number]   08/14/13.  Subjective/Complaints:  80 y.o. right-handed female with history of atrial fibrillation maintained on aspirin therapy, hypertension, polymyalgia rheumatica.  Admitted 08/08/2013 with acute onset of right-sided weakness. MRI of the brain showed acute small infarcts scattered throughout the left frontal parietal lobe as well as remote tiny right cerebellar infarct. MRA of the head with intracranial atherosclerotic type changes. Carotid Dopplers with no ICA stenosis. Echocardiogram with ejection fraction 13% grade 1 diastolic dysfunction. Patient did not receive TPA. Neurology services consulted presently maintained on aspirin 81 mg daily as well as subcutaneous Lovenox for DVT prophylaxis. Patient is tolerating a regular diet   Review of Systems - Negative except Hard of hearing;  No new complaints except c/o slight sob ambulating from BR earlier--wants to return to her prior independent level of care  Objective: Vital Signs: Blood pressure 169/89, pulse 82, temperature 97 F (36.1 C), temperature source Oral, resp. rate 20, SpO2 95.00%. No results found. No results found for this or any previous visit (from the past 72 hour(s)).  Patient Vitals for the past 24 hrs:  BP Temp Temp src Pulse Resp SpO2  08/14/13 0505 169/89 mmHg 97 F (36.1 C) Oral 82 20 95 %  08/13/13 2134 144/66 mmHg - - 85 - -  08/13/13 1439 155/76 mmHg 97.7 F (36.5 C) Oral 78 18 95 %     Intake/Output Summary (Last 24 hours) at 08/14/13 0835 Last data filed at 08/13/13 2154  Gross per 24 hour  Intake    960 ml  Output      0 ml  Net    960 ml      Physical Exam  Vitals reviewed.  Constitutional: She is oriented to person, place, and time. She appears well-developed.  HENT:  Anisocoria  Head: Normocephalic.  Eyes: EOM are normal.   Neck: Normal range of motion. Neck supple. No thyromegaly present.  Cardiovascular:  Cardiac controlled  Respiratory: Effort normal and breath sounds normal. No respiratory distress.  GI: Soft. Bowel sounds are normal. She exhibits no distension.  Neurological: She is alert and oriented to person, place, and time.  Follows full commands  Skin: Skin is warm and dry.  Wounds Left LE wrapped     Medical Problem List and Plan:  1. Embolic left frontoparietal infarct  2. Hypertension/atrial fibrillation. Cardiac rate control. Continue Toprol.  3. Polymyalgia rheumatica. Chronic prednisone 5 mg daily.  4. Hyperlipidemia. Zocor  5. Left lower extremity wound. Silicone foam dressing change every 3 days as needed     -Steri-Strips or xeroform placed in the wound bed   LOS (Days) 4 A FACE TO FACE EVALUATION WAS PERFORMED  Nyoka Cowden 08/14/2013, 8:35 AM

## 2013-08-14 NOTE — Progress Notes (Signed)
Physical Therapy Note  Patient Details  Name: ASHLIEGH PAREKH MRN: [id number] Date of Birth: 1918-03-15 Today's Date: 08/14/2013  1000-1055 (55 minutes) individual Pain: no reported pain Other: 2/4 dyspnea in supine ; oxygen sats > 92% RA Focus of treatment: Therapeutic exercise focused on RT LE strengthening/ activity tolerance; gait training; therapeutic activity focused on standing balance/safety without AD Treatment: Pt in bed upon arrival ; supine to sit SBA ; sit to stand SBA ; pt ambulates room>< gym (120+ feet) using rollator SBA; Nustep level 2 X 5 minutes with oxygen sats > 92% RA; Therapeutic exercise in standing X 15 - stepping up to 6 inch step RT LE for hip flexor strengthening ; up/down 2-3 steps with railing right / SPC left UE close SBA with vcs for sequencing; up/down curb height step with rollator min assist , with RW close SBA with vcs for safety; returned to room with all needs within reach.    5974-1638 (40 minutes) individual Pain: no reported pain Focus of treatment: gait training on level /unlevel surfaces ; therapeutic exercise focused on activity tolerance Treatment: Pt in bed upon arrival; supine to sit independent; transfer stand/turn SBA; gait on unlevel surfaces with rollator 300 feet SBA; Nustep Level 2 x 5 minutes ; gait to room 120 feet rollator SBA;  Gearldean Lomanto,JIM 08/14/2013, 10:10 AM

## 2013-08-14 NOTE — Progress Notes (Signed)
Occupational Therapy Note  Patient Details  Name: Krystal Hensley MRN: [id number] Date of Birth: Mar 14, 1918 Today's Date: 08/14/2013  Time:  3016-0109  (45 min)  1st session Pain:  Back pain  3/10 Individual session   1st session:  Addressed bed mobility, sit to stand, standing balance, functional mobility, transfers. Pt was supervision with bed mobility, Stood at sink to bathe and sat EOB to dresswWith distant supervision.  Ambulated to bathroom and performed peri care with supervision.  Pt. Stood at sink for 19 minutes during bathing with distant supervision. Pt. With moderated SOB during activity (3/4 on dyspena scale.  Ox sats= 92%.   Left Pt EOB with breakfast set up and call bell in reach.  .     Time:  1300-1345  (45 min)  2nd session Pain:  Back pain  3/10 Individual session  2nd  session:  Addressed bed mobility, sit to stand, standing balance, functional mobility, transfers in ADL apartment.   Pt. Ambulated to bathroom with rollator walker.  Practiced tub/shower transfer with shower seat (pt has seat with back and rails on each side at home).  She also has grab bar on long wall and bar attached to side of tub.  Pt got into tub with supervision  but moderate  assist  Getting out due to tub height.  Need to practice in bathroom where standard tub is located.  Pt. Ambulated to make bed with verbal cues to keep RW with her and not furniture walk around bed.  Pt ambulated back to room and transferred to toilet with SBA  And did pericare with SBA as well.  Transferred to bed and left with call bell in reach.      Lisa Roca 08/14/2013, 8:03 AM

## 2013-08-15 ENCOUNTER — Inpatient Hospital Stay (HOSPITAL_COMMUNITY): Payer: Medicare Other | Admitting: Occupational Therapy

## 2013-08-15 ENCOUNTER — Inpatient Hospital Stay (HOSPITAL_COMMUNITY): Payer: Medicare Other | Admitting: Rehabilitation

## 2013-08-15 ENCOUNTER — Inpatient Hospital Stay (HOSPITAL_COMMUNITY): Payer: Medicare Other | Admitting: Physical Therapy

## 2013-08-15 DIAGNOSIS — I10 Essential (primary) hypertension: Secondary | ICD-10-CM

## 2013-08-15 DIAGNOSIS — I4891 Unspecified atrial fibrillation: Secondary | ICD-10-CM

## 2013-08-15 DIAGNOSIS — I6340 Cerebral infarction due to embolism of unspecified cerebral artery: Secondary | ICD-10-CM

## 2013-08-15 DIAGNOSIS — M353 Polymyalgia rheumatica: Secondary | ICD-10-CM

## 2013-08-15 NOTE — Progress Notes (Signed)
Physical Therapy Session Note  Patient Details  Name: Krystal Hensley MRN: [id number] Date of Birth: 08/10/1917  Today's Date: 08/15/2013 Time: 1530-1620 Time Calculation (min): 50 min  Short Term Goals: Week 1:  PT Short Term Goal 1 (Week 1): STG = LTG due to short LOS  Skilled Therapeutic Interventions/Progress Updates:   aPt resting in bed.  Requesting to use bathroom prior to going to gym.  Performed supine > sit and transferred to/from toilet, toileting tasks and hand washing at sink with supervision and UE support on rollator.  Performed ambulation with rollator x 150' x 2 reps with supervision.  Reviewed OTAGO HEP for LE strength and balance with added challenge of standing on compliant foam during 12 reps each heel raises, toe raises, hip flexion marches, hip ABD, HS curls, hip extension and full squats with bilat UE support and min A.  Continued balance training with R and L lateral stepping, retro stepping and tandem gait with bilat HHA for balance x 25' x 2 reps each.  Pt required intermittent sitting rest breaks.  During OTAGO pt reported feeling very hot.  Returned to sitting and vitals assessed; all WFL.  Returned to room and pt left seated EOB to eat snack and drink.    Therapy Documentation Precautions:  Precautions Precautions: Fall Precaution Comments: right hemiparesis Required Braces or Orthoses: Other Brace/Splint Other Brace/Splint: wears back corset due to back pain - "crooked back" Restrictions Weight Bearing Restrictions: No Vital Signs: Therapy Vitals Pulse Rate: 87 BP: 131/81 mmHg Patient Position, if appropriate: Sitting Pain: Pain Assessment Pain Assessment: No/denies pain Pain Score: 0-No pain Locomotion : Ambulation Ambulation/Gait Assistance: 5: Supervision   See FIM for current functional status  Therapy/Group: Individual Therapy  Raylene Everts [facility] 08/15/2013, 4:28 PM

## 2013-08-15 NOTE — Progress Notes (Signed)
Physical Therapy Session Note  Patient Details  Name: Krystal Hensley MRN: [id number] Date of Birth: 1918/02/12  Today's Date: 08/15/2013 Time: 9629-5284 Time Calculation (min): 57 min  Short Term Goals: Week 1:  PT Short Term Goal 1 (Week 1): STG = LTG due to short LOS  Skilled Therapeutic Interventions/Progress Updates:   Pt received sitting at EOB, finishing up with OT session.  Pt ambulated >150' to/from gym with use of rollator at supervision level.  Pt doing much better keeping position inside of RW.  Ambulated to ortho gym in order to perform car transfer.  Set up car to height of home vehicle (SUV) and pt able to perform at supervision level with min cues for technique.  Ambulated back to therapy gym in order to perform stepping activity to challenge balance and to increase weight shift and weight bearing to the RLE.  Requires min assist to prevent LOB.  Also performed reaching activity with horse shoes reaching to the higher R to facilitate increased weight shift and WB in RLE and to increase functional use of RUE.  Note she tends to throw trunk posteriorly and does not extend elbow fully with task.  Feel that this is probably due to weakness and premorbid polymyalgia rheumatica.  Then performed stairs with use of single handrail and SPC to simulate home entry.  She performed at min/guard to close supervision level with min cues for technique (note very busy environment during session).  Also performed single step to simulate entry to den with use of rollator and also with RW to determine safety.  She does have some difficulty placing rollator to step, however is much safer with rollator than with RW.  Discussed this with pt and pts daughter.  Both verbalize understanding and agree with plan.  Pt ambulated back to room as stated above.  Pt left in room with all needs in reach and daughter present.      Therapy Documentation Precautions:  Precautions Precautions: Fall Precaution Comments:  right hemiparesis Required Braces or Orthoses: Other Brace/Splint Other Brace/Splint: wears back corset due to back pain - "crooked back" Restrictions Weight Bearing Restrictions: No   Pain: Pain Assessment Pain Score: 0-No pain   Locomotion : Ambulation Ambulation/Gait Assistance: 5: Supervision   See FIM for current functional status  Therapy/Group: Individual Therapy  Denice Bors 08/15/2013, 12:39 PM

## 2013-08-15 NOTE — Progress Notes (Signed)
Occupational Therapy Session Note  Patient Details  Name: Krystal Hensley MRN: [id number] Date of Birth: [birth year]  Today's Date: 08/15/2013 Time: 2297-9892 and 1194-1740 Time Calculation (min): 60 min and 30 min  Short Term Goals: Week 1:  OT Short Term Goal 1 (Week 1): STG = LTGs due to short ELOS  Skilled Therapeutic Interventions/Progress Updates:    1) Pt seen for ADL retraining with focus on safety with mobility, adaptive techniques to complete bathing and dressing tasks, and home management tasks.  Pt completed bathing at sit > stand level with supervision with standing 60% of time.  Pt completed toilet transfer with Rollator with supervision.  Pt completed dressing at EOB with lateral leans to complete UB dressing to increase UE ROM.  Provided pt with step stool to don socks and shoes as pt reports her bed is lower at home and she can place both feet on floor and bend to reach them but is unable at this bed height.  Discussed laundry setup and ambulated to laundry room to begin discussion of returning to performing laundry at home and energy conservation techniques to increase safety and independence with higher level tasks.  2) Pt seen for 1:1 OT with focus on tub/shower transfer in tubroom (due to lower tub height).  Pt ambulated to bathroom with Rollator walker. Practiced tub/shower transfer with shower seat (pt has seat with back and rails on each side at home). She also has grab bar on long wall and bar attached to side of tub. Pt got into tub with close supervision but min assist getting out due to no grab bar attached to tub and stepping out with LLE first.  Educated pt on stepping out with weaker RLE first as she got hung up with RLE coming out second. Engaged in dynamic balance activity with throwing horseshoes, pt able to maintain standing balance with occasional use of Rollator to side to steady self.  Gathered horse shoes from floor with one UE steadying on Rollator.  Pt returned  to room and bed for rest until next session.  Therapy Documentation Precautions:  Precautions Precautions: Fall Precaution Comments: right hemiparesis Required Braces or Orthoses: Other Brace/Splint Other Brace/Splint: wears back corset due to back pain - "crooked back" Restrictions Weight Bearing Restrictions: No Pain: Pain Assessment Pain Score: 0-No pain  See FIM for current functional status  Therapy/Group: Individual Therapy  Simonne Come 08/15/2013, 11:25 AM

## 2013-08-15 NOTE — Progress Notes (Signed)
Subjective/Complaints: 80 y.o. right-handed female with history of atrial fibrillation maintained on aspirin therapy, hypertension, polymyalgia rheumatica. Patient was modified independent prior to admission using a rolling walker inside the home and a cane for community access and still driving. Admitted 08/08/2013 with acute onset of right-sided weakness. MRI of the brain showed acute small infarcts scattered throughout the left frontal parietal lobe as well as remote tiny right cerebellar infarct. MRA of the head with intracranial atherosclerotic type changes. Carotid Dopplers with no ICA stenosis. Echocardiogram with ejection fraction 37% grade 1 diastolic dysfunction. Patient did not receive TPA. Neurology services consulted presently maintained on aspirin 81 mg daily as well as subcutaneous Lovenox for DVT prophylaxis. Patient is tolerating a regular diet  Pt slept ok "will I need someone at home with me ?" Review of Systems - Negative except Hard of hearing Slept well per report.  HOH needs hearing aides Objective: Vital Signs: Blood pressure 144/83, pulse 84, temperature 98.1 F (36.7 C), temperature source Oral, resp. rate 18, SpO2 97.00%. No results found. No results found for this or any previous visit (from the past 72 hour(s)).    Physical Exam  Vitals reviewed.  Constitutional: She is oriented to person, place, and time. She appears well-developed.  HENT:  Head: Normocephalic.  Eyes: EOM are normal.  Neck: Normal range of motion. Neck supple. No thyromegaly present.  Cardiovascular:  Cardiac controlled  Respiratory: Effort normal and breath sounds normal. No respiratory distress.  GI: Soft. Bowel sounds are normal. She exhibits no distension.  Neurological: She is alert and oriented to person, place, and time.  Follows full commands  Skin: Skin is warm and dry.  Ant shin abrasion with drsg Psychiatric: She has a normal mood and affect.  3-/5 Bilateral delt , 4/5 bi , tri,  grip, HF, KE, ankle Decreased Fine motor RUE Assessment/Plan: 1. Functional deficits secondary to Left fronto parietal infarct which require 3+ hours per day of interdisciplinary therapy in a comprehensive inpatient rehab setting. Physiatrist is providing close team supervision and 24 hour management of active medical problems listed below. Physiatrist and rehab team continue to assess barriers to discharge/monitor patient progress toward functional and medical goals. FIM: FIM - Bathing Bathing Steps Patient Completed: Chest;Right Arm;Left Arm;Abdomen;Front perineal area;Buttocks;Right upper leg;Left upper leg;Right lower leg (including foot);Left lower leg (including foot) Bathing: 5: Set-up assist to: Open items  FIM - Upper Body Dressing/Undressing Upper body dressing/undressing steps patient completed: Thread/unthread left sleeve of front closure shirt/dress;Pull shirt around back of front closure shirt/dress;Button/unbutton shirt;Hook/unhook bra;Thread/unthread left bra strap;Thread/unthread right bra strap Upper body dressing/undressing: 5: Set-up assist to: Obtain clothing/put away FIM - Lower Body Dressing/Undressing Lower body dressing/undressing steps patient completed: Thread/unthread right underwear leg;Thread/unthread left underwear leg;Pull underwear up/down;Thread/unthread right pants leg;Thread/unthread left pants leg;Pull pants up/down;Don/Doff right sock;Don/Doff left sock;Don/Doff right shoe;Don/Doff left shoe Lower body dressing/undressing: 5: Set-up assist to: Don/Doff TED stocking  FIM - Toileting Toileting steps completed by patient: Adjust clothing prior to toileting;Performs perineal hygiene;Adjust clothing after toileting Toileting Assistive Devices: Grab bar or rail for support Toileting: 5: Supervision: Safety issues/verbal cues  FIM - Radio producer Devices: Grab bars;Walker Toilet Transfers: 4-To toilet/BSC: Min A (steadying Pt. >  75%);4-From toilet/BSC: Min A (steadying Pt. > 75%)  FIM - Bed/Chair Transfer Bed/Chair Transfer Assistive Devices: Copy: 5: Supine > Sit: Supervision (verbal cues/safety issues);5: Bed > Chair or W/C: Supervision (verbal cues/safety issues);5: Sit > Supine: Supervision (verbal cues/safety issues);5: Chair or W/C >  Bed: Supervision (verbal cues/safety issues)  FIM - Locomotion: Wheelchair Locomotion: Wheelchair: 0: Activity did not occur FIM - Locomotion: Ambulation Locomotion: Ambulation Assistive Devices: Administrator Ambulation/Gait Assistance: 5: Supervision Locomotion: Ambulation: 5: Travels 150 ft or more with supervision/safety issues  Comprehension Comprehension Mode: Auditory Comprehension: 5-Understands complex 90% of the time/Cues < 10% of the time  Expression Expression Mode: Verbal Expression: 6-Expresses complex ideas: With extra time/assistive device  Social Interaction Social Interaction: 6-Interacts appropriately with others with medication or extra time (anti-anxiety, antidepressant).  Problem Solving Problem Solving: 5-Solves complex 90% of the time/cues < 10% of the time  Memory Memory: 6-Assistive device: No helper  Medical Problem List and Plan:  1. Embolic left frontoparietal infarct  2. DVT Prophylaxis/Anticoagulation: Subcutaneous Lovenox. Monitor platelet counts and any signs of bleeding  3. Pain Management: Tylenol as needed  4. Neuropsych: This patient is capable of making decisions on her own behalf.  5. Hypertension/atrial fibrillation. Cardiac rate control. Continue Toprol.  6. Polymyalgia rheumatica. Chronic prednisone 5 mg daily.  7. Hyperlipidemia. Zocor  8. Left lower extremity wound. Silicone foam dressing change every 3 days as needed do not remove Steri-Strips or xeroform placed in the wound bed   LOS (Days) 5 A FACE TO FACE EVALUATION WAS PERFORMED  Leanora Murin E 08/15/2013, 6:38 AM

## 2013-08-16 ENCOUNTER — Inpatient Hospital Stay (HOSPITAL_COMMUNITY): Payer: Medicare Other | Admitting: Rehabilitation

## 2013-08-16 ENCOUNTER — Inpatient Hospital Stay (HOSPITAL_COMMUNITY): Payer: Medicare Other | Admitting: Occupational Therapy

## 2013-08-16 NOTE — Plan of Care (Signed)
Problem: RH SKIN INTEGRITY Goal: RH STG SKIN FREE OF INFECTION/BREAKDOWN Pt will remain free of skin breakdown and free from infection while on rehab unit with minimal assist  Outcome: Progressing Skin tears w/ dressings to r and l lower legs,

## 2013-08-16 NOTE — Progress Notes (Signed)
Physical Therapy Session Note  Patient Details  Name: Krystal Hensley MRN: [id number] Date of Birth: 1917-09-08  Today's Date: 08/16/2013 Time: 1761-6073 Time Calculation (min): 43 min  Short Term Goals: Week 1:  PT Short Term Goal 1 (Week 1): STG = LTG due to short LOS  Skilled Therapeutic Interventions/Progress Updates:   Pt received lying in bed this morning, agreeable to therapy.  States she wants to use the restroom prior to session, therefore pt ambulated to/from restroom with rollator at supervision level and was able to negotiate pants/underwear and peri care at Mod I level with use of grab bar on pts L.  Ambulated back to sink to wash hands, all at supervision level.  Discussed having supervision at home for a short period of time before being on her own.  She states that son or daughter in law can provide supervision for one week following her D/C from hospital.  Also discussed having Calvin aide, HHPT, and also obtaining a Life Alert system in order to ensure safety.  Pt verbalized understanding and is in agreement.  Pt ambulated to/from therapy gym at supervision level with rollator.  PT demonstrated floor transfer and educated pt on when to call 911 vs when to attempt getting up.  Pt able to verbalize understanding and return demonstration with min assist.  Note pts RLE weak during transfer and requires some assist to elevate hips to seating surface.  Pt states she feels comfortable with transfer, however would like to practice again tomorrow.  Ambulated through obstacle course x 25' x 2 reps, then had pt pick up cones from floor to address balance and functional activities with gait through tight spaces.  Pt able to perform all at supervision level.  Pt returned to room and was left in bed with all needs in reach.    Therapy Documentation Precautions:  Precautions Precautions: Fall Precaution Comments: right hemiparesis Required Braces or Orthoses: Other Brace/Splint Other  Brace/Splint: wears back corset due to back pain - "crooked back" Restrictions Weight Bearing Restrictions: No   Pain: no pain stated during session.    Locomotion : Ambulation Ambulation/Gait Assistance: 5: Supervision   See FIM for current functional status  Therapy/Group: Individual Therapy  Denice Bors 08/16/2013, 12:31 PM

## 2013-08-16 NOTE — Progress Notes (Signed)
Physical Therapy Session Note  Patient Details  Name: Krystal Hensley MRN: [id number] Date of Birth: [birth year]  Today's Date: 08/16/2013 Time: 7494-4967 Time Calculation (min): 58 min  Short Term Goals: Week 1:  PT Short Term Goal 1 (Week 1): STG = LTG due to short LOS  Skilled Therapeutic Interventions/Progress Updates:   Pt received sitting on EOB, agreeable to therapy this afternoon.  Pt ambulated >150' during session with rollator at supervision level.  Ambulated over controlled and carpeted area.  Performed approx 20 mins of standing activity with single rest break while working on [employer] while also addressing fine motor coordination with B UE and reaching activity during session.  Then ambulated down to therapy gym and performed gait training with SPC x 60' at min assist level.  Pt continues to require min cues for correct sequencing/technique with use of cane and with mild LOB, esp when in busy hallway.  Ambulated over mat surface with cane to challenge balance and simulate outdoor surfaces.  Requires min assist with cane with min cues for technique.  Also performed cone tapping activity while standing on mat surface and min/mod assist to prevent LOB.  Pt ambulated back to room and was left in bed with all needs in reach.    Therapy Documentation Precautions:  Precautions Precautions: Fall Precaution Comments: right hemiparesis Required Braces or Orthoses: Other Brace/Splint Other Brace/Splint: wears back corset due to back pain - "crooked back" Restrictions Weight Bearing Restrictions: No   Vital Signs: Therapy Vitals Temp: 97.3 F (36.3 C) Temp src: Oral Pulse Rate: 91 Resp: 18 BP: 134/78 mmHg Patient Position, if appropriate: Sitting Oxygen Therapy SpO2: 91 % O2 Device: None (Room air) Pain: Pain Assessment Pain Assessment: No/denies pain   Other Treatments:    See FIM for current functional status  Therapy/Group: Individual Therapy  Denice Bors 08/16/2013, 6:33 PM

## 2013-08-16 NOTE — Progress Notes (Signed)
Subjective/Complaints: 80 y.o. right-handed female with history of atrial fibrillation maintained on aspirin therapy, hypertension, polymyalgia rheumatica. Patient was modified independent prior to admission using a rolling walker inside the home and a cane for community access and still driving. Admitted 08/08/2013 with acute onset of right-sided weakness. MRI of the brain showed acute small infarcts scattered throughout the left frontal parietal lobe as well as remote tiny right cerebellar infarct. MRA of the head with intracranial atherosclerotic type changes. Carotid Dopplers with no ICA stenosis. Echocardiogram with ejection fraction 67% grade 1 diastolic dysfunction. Patient did not receive TPA. Neurology services consulted presently maintained on aspirin 81 mg daily as well as subcutaneous Lovenox for DVT prophylaxis. Patient is tolerating a regular diet  Multiple questions regarding post d/c plans and level of supervision needed as well as home services availabe Review of Systems - Negative except Hard of hearing Slept well per report.  HOH needs hearing aides Objective: Vital Signs: Blood pressure 150/82, pulse 81, temperature 98 F (36.7 C), temperature source Oral, resp. rate 18, SpO2 96.00%. No results found. No results found for this or any previous visit (from the past 72 hour(s)).    Physical Exam  Vitals reviewed.  Constitutional: She is oriented to person, place, and time. She appears well-developed.  HENT:  Head: Normocephalic.  Eyes: EOM are normal.  Neck: Normal range of motion. Neck supple. No thyromegaly present.  Cardiovascular:  Cardiac controlled  Respiratory: Effort normal and breath sounds normal. No respiratory distress.  GI: Soft. Bowel sounds are normal. She exhibits no distension.  Neurological: She is alert and oriented to person, place, and time.  Follows full commands  Skin: Skin is warm and dry.  Ant shin abrasion with drsg Psychiatric: She has a normal  mood and affect.  4-/5 Bilateral delt , 4/5 bi , tri, grip, HF, KE, ankle Decreased Fine motor RUE Assessment/Plan: 1. Functional deficits secondary to Left fronto parietal infarct which require 3+ hours per day of interdisciplinary therapy in a comprehensive inpatient rehab setting. Physiatrist is providing close team supervision and 24 hour management of active medical problems listed below. Physiatrist and rehab team continue to assess barriers to discharge/monitor patient progress toward functional and medical goals. FIM: FIM - Bathing Bathing Steps Patient Completed: Chest;Right Arm;Left Arm;Abdomen;Front perineal area;Buttocks;Right upper leg;Left upper leg;Right lower leg (including foot);Left lower leg (including foot) Bathing: 5: Supervision: Safety issues/verbal cues  FIM - Upper Body Dressing/Undressing Upper body dressing/undressing steps patient completed: Thread/unthread left sleeve of front closure shirt/dress;Pull shirt around back of front closure shirt/dress;Button/unbutton shirt;Thread/unthread right sleeve of pullover shirt/dresss;Thread/unthread left sleeve of pullover shirt/dress;Put head through opening of pull over shirt/dress;Pull shirt over trunk;Thread/unthread right sleeve of front closure shirt/dress Upper body dressing/undressing: 5: Supervision: Safety issues/verbal cues FIM - Lower Body Dressing/Undressing Lower body dressing/undressing steps patient completed: Thread/unthread right underwear leg;Thread/unthread left underwear leg;Pull underwear up/down;Thread/unthread right pants leg;Thread/unthread left pants leg;Pull pants up/down;Don/Doff right sock;Don/Doff left sock;Don/Doff right shoe;Don/Doff left shoe Lower body dressing/undressing: 5: Supervision: Safety issues/verbal cues  FIM - Toileting Toileting steps completed by patient: Adjust clothing prior to toileting;Performs perineal hygiene;Adjust clothing after toileting Toileting Assistive Devices: Grab  bar or rail for support Toileting: 5: Supervision: Safety issues/verbal cues  FIM - Radio producer Devices: Grab bars;Walker Toilet Transfers: 5-To toilet/BSC: Supervision (verbal cues/safety issues);5-From toilet/BSC: Supervision (verbal cues/safety issues)  FIM - Bed/Chair Transfer Bed/Chair Transfer Assistive Devices: Adult nurse Transfer: 5: Supine > Sit: Supervision (verbal cues/safety issues);5: Bed > Chair or W/C:  Supervision (verbal cues/safety issues);5: Chair or W/C > Bed: Supervision (verbal cues/safety issues)  FIM - Locomotion: Wheelchair Locomotion: Wheelchair: 0: Activity did not occur FIM - Locomotion: Ambulation Locomotion: Ambulation Assistive Devices: Administrator Ambulation/Gait Assistance: 5: Supervision Locomotion: Ambulation: 5: Travels 150 ft or more with supervision/safety issues  Comprehension Comprehension Mode: Auditory Comprehension: 5-Understands basic 90% of the time/requires cueing < 10% of the time  Expression Expression Mode: Verbal Expression: 6-Expresses complex ideas: With extra time/assistive device  Social Interaction Social Interaction: 6-Interacts appropriately with others with medication or extra time (anti-anxiety, antidepressant).  Problem Solving Problem Solving: 5-Solves complex 90% of the time/cues < 10% of the time  Memory Memory: 6-Assistive device: No helper  Medical Problem List and Plan:  1. Embolic left frontoparietal infarct  2. DVT Prophylaxis/Anticoagulation: Subcutaneous Lovenox. Monitor platelet counts and any signs of bleeding  3. Pain Management: Tylenol as needed  4. Neuropsych: This patient is capable of making decisions on her own behalf.  5. Hypertension/atrial fibrillation. Cardiac rate control. Continue Toprol.  6. Polymyalgia rheumatica. Chronic prednisone 5 mg daily.  7. Hyperlipidemia. Zocor  8. Left lower extremity wound. Silicone foam dressing change every 3 days as  needed do not remove Steri-Strips or xeroform placed in the wound bed   LOS (Days) 6 A FACE TO FACE EVALUATION WAS PERFORMED  Zorianna Taliaferro E 08/16/2013, 6:45 AM

## 2013-08-16 NOTE — Consult Note (Signed)
WOC wound follow up Wound type: skin tear POA, LLE.  See previous notes Wound bed: xeroform in place in the wound bed per orders from urgent care MD.  Steri-strips intact to the lateral side. Wound bed much less wet today and the skin is much improved.  Drainage (amount, consistency, odor) moderate serosanguinous  Periwound: intact with ecchymosis, much less maceration of the skin flap Dressing procedure/placement/frequency: xeroform in wound bed, topper with xeroform also, wrap with kerlix, secure with stockinette.  Change every other day.   New skin tear that happened during therapy with car transfers. Foam in place, area is clean and does not appear infected.   Wound bed: clean, pink, moist with some crusting of old blood, not able to visualize the skin flap Drainage: minimal, sanguinous Periwound: intact Dressing: silicone foam to protect from further injury and insulate for re-epithelialization. Change every 3 days and PRN  WOC will follow along with you for wound assessment and updated wound care orders as needed.  [place], [place]

## 2013-08-16 NOTE — Progress Notes (Signed)
Occupational Therapy Session Note  Patient Details  Name: Krystal Hensley MRN: [id number] Date of Birth: [birth year]  Today's Date: 08/16/2013 Time: 0915-1000 and 1400-1445 Time Calculation (min): 45 min and 45 min  Short Term Goals: Week 1:  OT Short Term Goal 1 (Week 1): STG = LTGs due to short ELOS  Skilled Therapeutic Interventions/Progress Updates:    1)Pt seen for ADL retraining with focus on safety with mobility, adaptive techniques to complete bathing and dressing tasks. Pt completed bathing at sit > stand level with distant supervision with standing 60% of time. Pt gathered clothing and bathing supplies with Rollator with distant supervision. Pt completed dressing at EOB with lateral leans to complete UB dressing to increase UE ROM. Utilized step stool to don socks and shoes as pt reports her bed is lower at home. Began discussion of d/c planning and pt reports son plans to stay with her initially upon d/c.    2)Engaged in simulated simple meal prep in ADL kitchen with focus on safety and adaptive techniques to complete kitchen tasks.  Pt reports making cereal, simple sandwiches, and mostly microwave meals.  Encouraged pt to utilize Rollator seat and basket for energy conservation and safety to transport items.  Gathered items from fridge and transported to counter. Pt demonstrated how she passes dishes from counter with Lt hand to Rt hand at cabinet level due to decreased AROM in shoulders.  Discussed transition to home and potential d/c end of this week.   Therapy Documentation Precautions:  Precautions Precautions: Fall Precaution Comments: right hemiparesis Required Braces or Orthoses: Other Brace/Splint Other Brace/Splint: wears back corset due to back pain - "crooked back" Restrictions Weight Bearing Restrictions: No Pain:  Pt with no c/o pain  See FIM for current functional status  Therapy/Group: Individual Therapy  Yulissa Needham, [place] 08/16/2013, 11:11 AM

## 2013-08-16 NOTE — Progress Notes (Signed)
Social Work Patient ID: Krystal Hensley, female   DOB: 07/31/1917, 80 y.o.   MRN: [id number] Met with pt and daughter to discuss discharge needs and plan.  Pt reports her son and daughter in-law are coming to stay with her for one week while here Daughter is out of town.  She has heard she is going home on Friday.  Daughter reports this would work for her since she will be going out of town Sat. Discussed Home health preference she has used [facility] before and would like to use them again.  Aware team conference tomorrow and will touch base with then.

## 2013-08-17 ENCOUNTER — Inpatient Hospital Stay (HOSPITAL_COMMUNITY): Payer: Medicare Other | Admitting: Occupational Therapy

## 2013-08-17 ENCOUNTER — Inpatient Hospital Stay (HOSPITAL_COMMUNITY): Payer: Medicare Other | Admitting: Rehabilitation

## 2013-08-17 LAB — CREATININE, SERUM
CREATININE: 0.56 mg/dL (ref 0.50–1.10)
GFR calc Af Amer: >90 mL/min (ref >90)
GFR, EST NON AFRICAN AMERICAN: 78 mL/min — AB (ref >90)

## 2013-08-17 NOTE — Progress Notes (Signed)
Social Work Patient ID: Krystal Hensley, female   DOB: Nov 20, 1917, 80 y.o.   MRN: [id number] Met with pt and spoke with daughter via telephone to discuss team conference goals-mod/i level and discharge 2/13. Both are pleased with her progress and feel comfortable with the discharge on Friday.  Will make referral to [facility] pt's preference and Provide transportation resources for pt.  Work toward discharge on Friday.

## 2013-08-17 NOTE — Progress Notes (Signed)
Physical Therapy Session Note  Patient Details  Name: LATIFFANY HARWICK MRN: [id number] Date of Birth: May 01, 1918  Today's Date: 08/17/2013 Time: 8416-6063 Time Calculation (min): 45 min  Short Term Goals: Week 1:  PT Short Term Goal 1 (Week 1): STG = LTG due to short LOS  Skilled Therapeutic Interventions/Progress Updates:   Pt received lying in bed this afternoon, agreeable to PT session.  Focus of session was gait in controlled environment with standing dynamic balance and alternating attention during task.  Pt ambulated >200' with rollator at Mod I level to pediatric floor in order to hand Valentines made during yesterdays session.  Performed all reaching with Mod I level with no overt LOB during activity.  She was also able to maintain standing/gait throughout entire activity without seated rest break (30 mins).  Returned to rehab unit and ended session with 5 mins of seated nustep with LEs only at level 2 resistance.  Pt notably dyspneic at end of activity, therefore allowed rest break during activity and following activity.  Pt left in gym with OT present.    Therapy Documentation Precautions:  Precautions Precautions: Fall Precaution Comments: right hemiparesis Required Braces or Orthoses: Other Brace/Splint Other Brace/Splint: wears back corset due to back pain - "crooked back" Restrictions Weight Bearing Restrictions: No   Pain: Pt with no c/o pain this afternoon.      See FIM for current functional status  Therapy/Group: Individual Therapy  Denice Bors 08/17/2013, 1:42 PM

## 2013-08-17 NOTE — Progress Notes (Signed)
Subjective/Complaints: 80 y.o. right-handed female with history of atrial fibrillation maintained on aspirin therapy, hypertension, polymyalgia rheumatica. Patient was modified independent prior to admission using a rolling walker inside the home and a cane for community access and still driving. Admitted 08/08/2013 with acute onset of right-sided weakness. MRI of the brain showed acute small infarcts scattered throughout the left frontal parietal lobe as well as remote tiny right cerebellar infarct. MRA of the head with intracranial atherosclerotic type changes. Carotid Dopplers with no ICA stenosis. Echocardiogram with ejection fraction 67% grade 1 diastolic dysfunction. Patient did not receive TPA. Neurology services consulted presently maintained on aspirin 81 mg daily as well as subcutaneous Lovenox for DVT prophylaxis. Patient is tolerating a regular diet  No new issues   Review of Systems - Negative except Hard of hearing Slept well per report.  HOH needs hearing aides Objective: Vital Signs: Blood pressure 127/70, pulse 80, temperature 97.9 F (36.6 C), temperature source Oral, resp. rate 17, weight 52.8 kg (116 lb 6.5 oz), SpO2 95.00%. No results found. Results for orders placed during the hospital encounter of 08/10/13 (from the past 72 hour(s))  CREATININE, SERUM     Status: Abnormal   Collection Time    08/17/13  5:23 AM      Result Value Ref Range   Creatinine, Ser 0.56  0.50 - 1.10 mg/dL   GFR calc non Af Amer 78 (*) >90 mL/min   GFR calc Af Amer >90  >90 mL/min   Comment: (NOTE)     The eGFR has been calculated using the CKD EPI equation.     This calculation has not been validated in all clinical situations.     eGFR's persistently <90 mL/min signify possible Chronic Kidney     Disease.      Physical Exam  Vitals reviewed.  Constitutional: She is oriented to person, place, and time. She appears well-developed.  HENT:  Head: Normocephalic.  Eyes: EOM are normal.  Neck:  Normal range of motion. Neck supple. No thyromegaly present.  Cardiovascular:  Cardiac controlled  Respiratory: Effort normal and breath sounds normal. No respiratory distress.  GI: Soft. Bowel sounds are normal. She exhibits no distension.  Neurological: She is alert and oriented to person, place, and time.  Follows full commands  Skin: Skin is warm and dry.  Ant shin abrasion dry scab Right post leg abrasion dried blood no active bleeding  Psychiatric: She has a normal mood and affect.  4-/5 Bilateral delt , 4/5 bi , tri, grip, HF, KE, ankle Decreased Fine motor RUE Assessment/Plan: 1. Functional deficits secondary to Left fronto parietal infarct which require 3+ hours per day of interdisciplinary therapy in a comprehensive inpatient rehab setting. Physiatrist is providing close team supervision and 24 hour management of active medical problems listed below. Physiatrist and rehab team continue to assess barriers to discharge/monitor patient progress toward functional and medical goals. Team conference today please see physician documentation under team conference tab, met with team face-to-face to discuss problems,progress, and goals. Formulized individual treatment plan based on medical history, underlying problem and comorbidities. FIM: FIM - Bathing Bathing Steps Patient Completed: Chest;Right Arm;Left Arm;Abdomen;Front perineal area;Buttocks;Right upper leg;Left upper leg;Right lower leg (including foot);Left lower leg (including foot) Bathing: 6: More than reasonable amount of time  FIM - Upper Body Dressing/Undressing Upper body dressing/undressing steps patient completed: Thread/unthread left sleeve of front closure shirt/dress;Pull shirt around back of front closure shirt/dress;Button/unbutton shirt;Thread/unthread right sleeve of pullover shirt/dresss;Thread/unthread left sleeve of pullover shirt/dress;Put head  through opening of pull over shirt/dress;Pull shirt over  trunk;Thread/unthread right sleeve of front closure shirt/dress;Thread/unthread right bra strap;Thread/unthread left bra strap;Hook/unhook bra Upper body dressing/undressing: 6: More than reasonable amount of time FIM - Lower Body Dressing/Undressing Lower body dressing/undressing steps patient completed: Thread/unthread right underwear leg;Thread/unthread left underwear leg;Pull underwear up/down;Thread/unthread right pants leg;Thread/unthread left pants leg;Pull pants up/down;Don/Doff right sock;Don/Doff left sock;Don/Doff right shoe;Don/Doff left shoe Lower body dressing/undressing: 5: Supervision: Safety issues/verbal cues  FIM - Toileting Toileting steps completed by patient: Adjust clothing prior to toileting;Performs perineal hygiene;Adjust clothing after toileting Toileting Assistive Devices: Grab bar or rail for support Toileting: 6: Assistive device: No helper  FIM - Radio producer Devices: Grab bars;Walker Toilet Transfers: 5-To toilet/BSC: Supervision (verbal cues/safety issues);5-From toilet/BSC: Supervision (verbal cues/safety issues)  FIM - Control and instrumentation engineer Devices: Copy: 6: Supine > Sit: No assist  FIM - Locomotion: Wheelchair Locomotion: Wheelchair: 0: Activity did not occur FIM - Locomotion: Ambulation Locomotion: Ambulation Assistive Devices: Administrator Ambulation/Gait Assistance: 5: Supervision Locomotion: Ambulation: 5: Travels 150 ft or more with supervision/safety issues  Comprehension Comprehension Mode: Auditory Comprehension: 5-Understands complex 90% of the time/Cues < 10% of the time  Expression Expression Mode: Verbal Expression: 6-Expresses complex ideas: With extra time/assistive device  Social Interaction Social Interaction: 6-Interacts appropriately with others with medication or extra time (anti-anxiety, antidepressant).  Problem Solving Problem Solving:  5-Solves complex 90% of the time/cues < 10% of the time  Memory Memory: 6-Assistive device: No helper  Medical Problem List and Plan:  1. Embolic left frontoparietal infarct  2. DVT Prophylaxis/Anticoagulation: Subcutaneous Lovenox. Monitor platelet counts and any signs of bleeding  3. Pain Management: Tylenol as needed  4. Neuropsych: This patient is capable of making decisions on her own behalf.  5. Hypertension/atrial fibrillation. Cardiac rate control. Continue Toprol.  6. Polymyalgia rheumatica. Chronic prednisone 5 mg daily.  7. Hyperlipidemia. Zocor  8. Left lower extremity ant leg wound. Silicone foam dressing change every 3 days as needed do not remove Steri-Strips or xeroform placed in the wound bed RLE post leg wound foam dressing change q3d and prn  LOS (Days) 7 A FACE TO FACE EVALUATION WAS PERFORMED  Krystal Hensley 08/17/2013, 7:17 AM

## 2013-08-17 NOTE — Progress Notes (Signed)
Social Work Krystal Hashimoto, LCSW Social Worker Signed  Patient Care Conference Service date: 08/17/2013 1:46 PM  Inpatient RehabilitationTeam Conference and Plan of Care Update Date: 08/17/2013   Time: 11: 25 AM     Patient Name: Krystal Hensley       Medical Record Number: [id number]   Date of Birth: [birth year] Sex: Female         Room/Bed: 4W17C/4W17C-01 Payor Info: Payor: MEDICARE / Plan: MEDICARE PART A AND B / Product Type: *No Product type* /   Admitting Diagnosis: L CVA   Admit Date/Time:  08/10/2013  5:30 PM Admission Comments: No comment available   Primary Diagnosis:  <principal problem not specified> Principal Problem: <principal problem not specified>    Patient Active Problem List     Diagnosis  Date Noted   .  CVA (cerebral infarction)  08/08/2013   .  RUE weakness  08/08/2013   .  HTN (hypertension)  08/08/2013   .  PMR (polymyalgia rheumatica)  08/08/2013   .  Right sided weakness  08/08/2013   .  Wound of left leg  08/08/2013   .  A-fib  08/08/2013     Expected Discharge Date: Expected Discharge Date: 08/19/13  Team Members Present: Physician leading conference: Dr. Alysia Penna Social Worker Present: Alfonse Alpers, LCSW;Becky Glenys Snader, LCSW Nurse Present: Other (comment) Maudry Mayhew Rosero-RN) PT Present: Raylene Everts, PT;Emily Rinaldo Cloud, PT OT Present: Simonne Come, OT;Kris Gellert, Maryella Shivers, OT SLP Present: Gunnar Fusi, SLP PPS Coordinator present : Daiva Nakayama, RN, CRRN        Current Status/Progress  Goal  Weekly Team Focus   Medical     cont bowel and bladder no new issues  mod I levels vs sup  advise pt of assist level at D/C   Bowel/Bladder     continent of bowel and bladder  remains continent w/ regular bms Q1-2 days     Swallow/Nutrition/ Hydration     WFL       ADL's     supervision-mod I with self-care tasks, min assist tub/shower transfer  mod I overall  higher level balance and IADL tasks   Mobility     supervision to mod I for all  mobility, supervision to min assist for gait with SPC  Mod I overall  safety, high level balance   Communication     [facility]       Safety/Cognition/ Behavioral Observations    Follows safety plan.  D/C to safe enviornment w/ safe moblity     Pain     pain level<3       Skin     Keep skin with out skin breakdown         *See Care Plan and progress notes for long and short-term goals.    Barriers to Discharge:  see above      Possible Resolutions to Barriers:    see above      Discharge Planning/Teaching Needs:    Home with son and daughter in-law for a week and then intermittent assist, when daughter returns from out of town.       Team Discussion:    Making good progress and will be ready for discharge on Friday.  Skin issues-needs HHRN at home. Son and daughter in-law to stay with one week for transition to home   Revisions to Treatment Plan:    None    Continued Need for Acute Rehabilitation Level of Care: The patient requires daily medical  management by a physician with specialized training in physical medicine and rehabilitation for the following conditions: Daily direction of a multidisciplinary physical rehabilitation program to ensure safe treatment while eliciting the highest outcome that is of practical value to the patient.: Yes Daily medical management of patient stability for increased activity during participation in an intensive rehabilitation regime.: Yes Daily analysis of laboratory values and/or radiology reports with any subsequent need for medication adjustment of medical intervention for : Neurological problems  Krystal Hensley 08/17/2013, 1:46 PM          Patient ID: Krystal Hensley, female   DOB: Mar 23, 1918, 80 y.o.   MRN: [id number]

## 2013-08-17 NOTE — Progress Notes (Signed)
Physical Therapy Session Note  Patient Details  Name: Krystal Hensley MRN: [id number] Date of Birth: 14-Feb-1918  Today's Date: 08/17/2013 Time: 0830-0928 Time Calculation (min): 58 min  Short Term Goals: Week 1:  PT Short Term Goal 1 (Week 1): STG = LTG due to short LOS  Skilled Therapeutic Interventions/Progress Updates:   Pt received sitting on EOB, receiving meds from RN.  Pt ambulated to/from restroom with rollator at Mod I level and also performed all toileting at Mod I level.  Pt returned to EOB to don pants, shirts, and back brace at Mod I level for increased time required due to difficulty with shoulder ROM.  Pt ambulated >150' during session with rollator on controlled environment at Mod I level.  Once in gym, performed floor transfer again today.  Pt was able to get into floor at supervision level, and also get back into tall kneeling at supervision, however she required min assist to elevate hips to mat surface.  Transfer looked much improved from yesterday.  Performed high level gait and balance activity while tossing ball up/down at min/guard to min assist level.  Pt with no overt LOB during activity.  Performed 50' x 2 reps.  Once back in gym, performed stepping to cones for focus on increased weight shift to the R and increased WB on RLE.  Also for balance challenge.  Performed gait while pushing shopping cart and retrieving horse shoes.  Pt able to reach high and low at supervision level to retreive horse shoes.  Also standing in // bars on rocker board tilted horizontally without UE support at min/guard to close supervision level progressing to performing head turns R and L and up/down while standing on rocker board.  This required more assist (min assist).  Pt with very good ankle and hip strategy during activity.  Ended session with seated kinetron x 4 mins at moderate resistance in order to increase activity tolerance and R hip ext activation to carryover to gait.  Pt ambulated back  to room and was left in bed to wait for next session.  All needs in reach.    Therapy Documentation Precautions:  Precautions Precautions: Fall Precaution Comments: right hemiparesis Required Braces or Orthoses: Other Brace/Splint Other Brace/Splint: wears back corset due to back pain - "crooked back" Restrictions Weight Bearing Restrictions: No   Pain: Pt with no c/o pain during session.     See FIM for current functional status  Therapy/Group: Individual Therapy  Denice Bors 08/17/2013, 11:23 AM

## 2013-08-17 NOTE — Progress Notes (Signed)
Occupational Therapy Session Note  Patient Details  Name: Krystal Hensley MRN: [id number] Date of Birth: 1917-09-21  Today's Date: 08/17/2013 Time: 7017-7939 and 0300-9233 Time Calculation (min): 60 min and 30 min  Short Term Goals: Week 1:  OT Short Term Goal 1 (Week 1): STG = LTGs due to short ELOS  Skilled Therapeutic Interventions/Progress Updates:    1)Pt seen for ADL retraining with focus on safety with mobility, adaptive techniques to complete bathing and dressing tasks, and shower transfer. Pt completed bathing at sit > stand level in room shower with distant supervision with standing 60% of time. Pt gathered clothing and bathing supplies with Rollator with distant supervision. Pt completed dressing at EOB with lateral leans to complete UB dressing to increase UE ROM. Utilized step stool to don socks and shoes, pt reports plan to have daughter purchase a step stool to use at home.    2) Engaged in advanced ADLs with focus on laundry task in sitting and standing.  Ambulated to laundry room with Rollator to complete laundry task. Pt moved clothes from washer to dryer, demonstrating ability to reach in to washer and dryer with increased time due to decreased shoulder ROM from premorbid polymyalgia, folding towels, and placing them on towel bar in bathroom.  Pt used Rollator to [employer].  Pt made Mod I in room to go to/from toilet with use of Rollator without supervision, discussed importance of using Rollator with mobility.  Therapy Documentation Precautions:  Precautions Precautions: Fall Precaution Comments: right hemiparesis Required Braces or Orthoses: Other Brace/Splint Other Brace/Splint: wears back corset due to back pain - "crooked back" Restrictions Weight Bearing Restrictions: No Pain:  Pt with no c/o pain  See FIM for current functional status  Therapy/Group: Individual Therapy  Simonne Come 08/17/2013, 11:37 AM

## 2013-08-17 NOTE — Patient Care Conference (Signed)
Inpatient RehabilitationTeam Conference and Plan of Care Update Date: 08/17/2013   Time: 11: 25 AM    Patient Name: Krystal Hensley      Medical Record Number: [id number]  Date of Birth: 07/14/1917 Sex: Female         Room/Bed: 4W17C/4W17C-01 Payor Info: Payor: MEDICARE / Plan: MEDICARE PART A AND B / Product Type: *No Product type* /    Admitting Diagnosis: L CVA  Admit Date/Time:  08/10/2013  5:30 PM Admission Comments: No comment available   Primary Diagnosis:  <principal problem not specified> Principal Problem: <principal problem not specified>  Patient Active Problem List   Diagnosis Date Noted  . CVA (cerebral infarction) 08/08/2013  . RUE weakness 08/08/2013  . HTN (hypertension) 08/08/2013  . PMR (polymyalgia rheumatica) 08/08/2013  . Right sided weakness 08/08/2013  . Wound of left leg 08/08/2013  . A-fib 08/08/2013    Expected Discharge Date: Expected Discharge Date: 08/19/13  Team Members Present: Physician leading conference: Dr. Alysia Penna Social Worker Present: Alfonse Alpers, LCSW;Becky Alena Blankenbeckler, LCSW Nurse Present: Other (comment) Maudry Mayhew Rosero-RN) PT Present: Raylene Everts, PT;Emily Rinaldo Cloud, PT OT Present: Simonne Come, OT;Kris Gellert, Maryella Shivers, OT SLP Present: Gunnar Fusi, SLP PPS Coordinator present : Daiva Nakayama, RN, CRRN     Current Status/Progress Goal Weekly Team Focus  Medical   cont bowel and bladder no new issues  mod I levels vs sup  advise pt of assist level at D/C   Bowel/Bladder   continent of bowel and bladder  remains continent w/ regular bms Q1-2 days      Swallow/Nutrition/ Hydration     WFL        ADL's   supervision-mod I with self-care tasks, min assist tub/shower transfer  mod I overall  higher level balance and IADL tasks   Mobility   supervision to mod I for all mobility, supervision to min assist for gait with SPC  Mod I overall  safety, high level balance   Communication     [facility]        Safety/Cognition/  Behavioral Observations  Follows safety plan.  D/C to safe enviornment w/ safe moblity      Pain   pain level<3         Skin   Keep skin with out skin breakdown            *See Care Plan and progress notes for long and short-term goals.  Barriers to Discharge: see above    Possible Resolutions to Barriers:  see above    Discharge Planning/Teaching Needs:  Home with son and daughter in-law for a week and then intermittent assist, when daughter returns from out of town.        Team Discussion:  Making good progress and will be ready for discharge on Friday.  Skin issues-needs HHRN at home. Son and daughter in-law to stay with one week for transition to home  Revisions to Treatment Plan:  None   Continued Need for Acute Rehabilitation Level of Care: The patient requires daily medical management by a physician with specialized training in physical medicine and rehabilitation for the following conditions: Daily direction of a multidisciplinary physical rehabilitation program to ensure safe treatment while eliciting the highest outcome that is of practical value to the patient.: Yes Daily medical management of patient stability for increased activity during participation in an intensive rehabilitation regime.: Yes Daily analysis of laboratory values and/or radiology reports with any subsequent need for medication adjustment of medical  intervention for : Neurological problems  Elease Hashimoto 08/17/2013, 1:46 PM

## 2013-08-18 ENCOUNTER — Inpatient Hospital Stay (HOSPITAL_COMMUNITY): Payer: Medicare Other | Admitting: Rehabilitation

## 2013-08-18 ENCOUNTER — Inpatient Hospital Stay (HOSPITAL_COMMUNITY): Payer: Medicare Other | Admitting: Occupational Therapy

## 2013-08-18 NOTE — Discharge Summary (Signed)
Discharge summary job # [id number]

## 2013-08-18 NOTE — Progress Notes (Signed)
I have reviewed and agree with the attached treatment note.  Balbina Depace, OTR/L 

## 2013-08-18 NOTE — Progress Notes (Signed)
Occupational Therapy Session Note  Patient Details  Name: Krystal Hensley MRN: [id number] Date of Birth: 1918-06-02  Today's Date: 08/18/2013 Time: 1478-2956 Time Calculation (min): 56 min  Short Term Goals: Week 1:  OT Short Term Goal 1 (Week 1): STG = LTGs due to short ELOS  Skilled Therapeutic Interventions/Progress Updates:    Pt completed ADL retraining at overall modified independent level.  Pt gathered clothing with Rollator and completed bathing at sit > stand level at sink.  [name] arrived at end of session, discussed check out time for d/c and follow up therapies as well as follow up appointments.  Recommend supervision initially with tub/shower transfer as pt has required min-supervision in ADL tubroom with transfers.  Therapy Documentation Precautions:  Precautions Precautions: Fall Precaution Comments: right hemiparesis Required Braces or Orthoses: Other Brace/Splint Other Brace/Splint: wears back corset due to back pain - "crooked back" Restrictions Weight Bearing Restrictions: No Pain: Pain Assessment Pain Assessment: No/denies pain Pain Score: 0-No pain  See FIM for current functional status  Therapy/Group: Individual Therapy  Simonne Come 08/18/2013, 12:06 PM

## 2013-08-18 NOTE — Progress Notes (Signed)
Physical Therapy Discharge Summary  Patient Details  Name: Krystal Hensley MRN: [id number] Date of Birth: [birth year]  Today's Date: 08/18/2013 Time: 0923-3007 and 6226-3335  Time Calculation (min): 56 min and 43 mins  Patient has met 9 of 9 long term goals due to improved activity tolerance, improved balance, improved postural control, increased strength, decreased pain, ability to compensate for deficits, functional use of  right upper extremity and right lower extremity, improved attention, improved awareness and improved coordination.  Patient to discharge at an ambulatory level Modified Independent.   Pt states she will have her son staying with her for first week after D/C and that he plans to get her a life alert for increased safety.  She also states that her neighbor will be available to check in on her.   Reasons goals not met: Pt will continue to benefit from high level balance activities to decrease fall risk at home.   Recommendation:  Patient will benefit from ongoing skilled PT services in home health setting to continue to advance safe functional mobility, address ongoing impairments in decreased balance, decreased safety, decreased strength in RLE, and minimize fall risk.  Equipment: No equipment provided  Reasons for discharge: treatment goals met and discharge from hospital  Patient/family agrees with progress made and goals achieved: Yes  PT Treatment/Intervention: AM session: Pt received sitting on EOB this morning.  She was able to don clothing and brace at Mod I level for increased time and effort required to complete task.  Pt ambulated to ortho gym in order to perform car transfer at Mod I level this morning.  Ambulated over controlled, carpeted and busy environment >200' with rollator at Mod I level.  Performed bed mobility and furniture transfer in ADL apt to better simulate home environment at Mod I level.  Pt ambulated back to gym in order to perform single step  with rollator at Mod I level, then another 3 steps to simulate home entry with single handrail and SPC at Mod I level.  Performed BERG balance test with score of 37/56, improved from 17/56 on day of evaluation.  Discussed that she continues to be a high fall risk, however has made a clinical difference in her balance since being here.  Performed strength, sensation, coordination, and proprioception assessment.  See details below.  Ended session with floor transfer in which pt was able to perform at Mod I level today and demonstrates good awareness of safety precautions of when to call 911 vs when to attempt floor transfer.  Pt ambulated back to room and left at EOB.  All needs in room.     PM session: Pt received sitting at EOB, agreeable to therapy.  Pt ambulated >300' during session at Mod I level to day room (carpeted) and performed dynamic standing balance activity while attending Corena Pilgrim party.  She was able to reach forward and side/side in order to retrieve card, all without seated rest break.  Pt ambulated to therapy gym and performed dynamic standing balance activity while standing on foam pad tossing horse shoes.  Then had pt retrieve horse shoes without use of rollator.  Requires min/guard assist to get to horseshoes, however pt then able to pick them up at supervision level.  Performed activity x 15 mins with seated rest break in between. Pt ambulated back to room and was left on side of bed, all needs in room.    PT Discharge Precautions/Restrictions Precautions Precautions: Fall Required Braces or Orthoses: Other Brace/Splint Other Brace/Splint:  wears back corset due to back pain - "crooked back" Restrictions Weight Bearing Restrictions: No   Pain Pain Assessment Pain Assessment: No/denies pain Pain Score: 0-No pain    Cognition Overall Cognitive Status: Within Functional Limits for tasks assessed Arousal/Alertness: Awake/alert Orientation Level: Oriented X4 Attention:  Focused;Sustained;Selective Focused Attention: Appears intact Sustained Attention: Appears intact Selective Attention: Appears intact Memory: Appears intact Awareness: Appears intact Safety/Judgment: Appears intact Sensation Sensation Light Touch: Appears Intact Stereognosis: Not tested Hot/Cold: Not tested Proprioception: Appears Intact Coordination Gross Motor Movements are Fluid and Coordinated: Yes Fine Motor Movements are Fluid and Coordinated: Yes Heel Shin Test: Grossly WFL, unable to fully perform due to wound on LLE shin.  Motor  Motor Motor: Hemiplegia Motor - Discharge Observations: Pt continues to have balance deficits and slight weakness in RLE vs LLE  Mobility Bed Mobility Bed Mobility: Supine to Sit;Sit to Supine Supine to Sit: 6: Modified independent (Device/Increase time) Sit to Supine: 6: Modified independent (Device/Increase time) Transfers Transfers: Yes Sit to Stand: 6: Modified independent (Device/Increase time) Stand to Sit: 6: Modified independent (Device/Increase time) Stand Pivot Transfers: 6: Modified independent (Device/Increase time) Locomotion  Ambulation Ambulation: Yes Ambulation/Gait Assistance: 6: Modified independent (Device/Increase time) Ambulation Distance (Feet): 200 Feet Assistive device: Rollator Gait Gait: Yes Gait Pattern: Impaired Gait Pattern: Decreased hip/knee flexion - right;Decreased stance time - right;Trunk flexed Stairs / Additional Locomotion Stairs: Yes Stairs Assistance: 6: Modified independent (Device/Increase time) Stair Management Technique: No rails;One rail Right;Step to pattern;With walker;Forwards;With cane Number of Stairs: 5 Height of Stairs: 4 (and 6) Wheelchair Mobility Wheelchair Mobility: No  Trunk/Postural Assessment  Cervical Assessment Cervical Assessment: Exceptions to [facility] Cervical Strength Overall Cervical Strength Comments: pt with forward head posture Postural Control Postural Control:  Deficits on evaluation Postural Limitations: Note pt with history of scoliosis and also demonstrates posterior pelvic tilt in sitting with forward shoulders, kyphotic posture.   Balance Balance Balance Assessed: Yes Standardized Balance Assessment Standardized Balance Assessment: Berg Balance Test Berg Balance Test Sit to Stand: Able to stand  independently using hands Standing Unsupported: Able to stand safely 2 minutes Sitting with Back Unsupported but Feet Supported on Floor or Stool: Able to sit safely and securely 2 minutes Stand to Sit: Controls descent by using hands Transfers: Able to transfer safely, definite need of hands Standing Unsupported with Eyes Closed: Able to stand 10 seconds with supervision Standing Ubsupported with Feet Together: Able to place feet together independently and stand for 1 minute with supervision From Standing, Reach Forward with Outstretched Arm: Reaches forward but needs supervision From Standing Position, Pick up Object from Floor: Able to pick up shoe, needs supervision From Standing Position, Turn to Look Behind Over each Shoulder: Looks behind one side only/other side shows less weight shift Turn 360 Degrees: Needs close supervision or verbal cueing Standing Unsupported, Alternately Place Feet on Step/Stool: Able to complete 4 steps without aid or supervision Standing Unsupported, One Foot in Front: Able to plae foot ahead of the other independently and hold 30 seconds Standing on One Leg: Tries to lift leg/unable to hold 3 seconds but remains standing independently Total Score: 37 Dynamic Standing Balance Dynamic Standing - Balance Support: During functional activity Dynamic Standing - Level of Assistance: 6: Modified independent (Device/Increase time) Extremity Assessment      RLE Assessment RLE Assessment: Exceptions to [facility] RLE Strength RLE Overall Strength Comments: hip flex 3+/5, knee ext 4/5, knee flex 4/5, ankle DF 4/5, PF 4/5 LLE  Assessment LLE Assessment: Within Functional Limits (  hip flex slightly weaker than other movements. )  See FIM for current functional status  Denice Bors 08/18/2013, 11:57 AM

## 2013-08-18 NOTE — Progress Notes (Signed)
Occupational Therapy Session Note  Patient Details  Name: Krystal Hensley MRN: [id number] Date of Birth: 08/19/1917  Today's Date: 08/18/2013 Time: 3276-1470 Time Calculation (min): 45 min  Short Term Goals: Week 1:  OT Short Term Goal 1 (Week 1): STG = LTGs due to short ELOS  Skilled Therapeutic Interventions/Progress Updates:    Pt was in bed upon arrival. Pt ambulated with rollator to therapy apartment and then to day room.  Pt participated in practicing dynamic standing balance, reaching items while standing, functionally ambulating around surface to set up items and increasing activity tolerance and endurance.  Pt required min verbal cues to complete tasks and problem solve.  Pt ambulated with rollator from day room to therapy apartment and took a rest break.  Pt then practiced tub transfer with close supervision and min verbal cueing.  Pt had no c/o pain.   Therapy Documentation Precautions:  Precautions Precautions: Fall Precaution Comments: right hemiparesis Required Braces or Orthoses: Other Brace/Splint Other Brace/Splint: wears back corset due to back pain - "crooked back" Restrictions Weight Bearing Restrictions: No  See FIM for current functional status  Therapy/Group: Individual Therapy  Sharlee Rufino 08/18/2013, 2:38 PM

## 2013-08-18 NOTE — Progress Notes (Signed)
Occupational Therapy Discharge Summary  Patient Details  Name: Krystal Hensley MRN: [id number] Date of Birth: 09/07/1917  Today's Date: 08/18/2013  Patient has met 8 of 9 long term goals due to improved activity tolerance, improved balance, ability to compensate for deficits and functional use of  RIGHT upper, RIGHT lower and LEFT upper extremity.  Patient to discharge at overall Modified Independent level.  Patient's care partner is independent to provide the necessary physical assistance at discharge.  Recommend that pt have supervision initially with tub/shower transfers due to bathroom setup.  Reasons goals not met: Pt currently supervision with tub/shower transfers, recommend pt have supervision initially with tub/shower transfers due to bathroom setup.  Recommendation:  Patient will benefit from ongoing skilled OT services in home health setting to continue to advance functional skills in the area of BADL, iADL and Reduce care partner burden.  Equipment: No equipment provided  Reasons for discharge: treatment goals met and discharge from hospital  Patient/family agrees with progress made and goals achieved: Yes  OT Discharge Precautions/Restrictions  Precautions Required Braces or Orthoses: Other Brace/Splint Other Brace/Splint: wears back corset due to back pain - "crooked back" Pain Pain Assessment Pain Assessment: No/denies pain ADL  See FIM Vision/Perception  Vision - History Baseline Vision: Wears glasses all the time Patient Visual Report: No change from baseline Vision - Assessment Eye Alignment: Within Functional Limits  Cognition Overall Cognitive Status: Within Functional Limits for tasks assessed Arousal/Alertness: Awake/alert Orientation Level: Oriented X4 Attention: Focused;Sustained;Selective Selective Attention: Appears intact Memory: Appears intact Awareness: Appears intact Safety/Judgment: Appears intact Sensation Sensation Light Touch: Appears  Intact Stereognosis: Not tested Hot/Cold: Not tested Proprioception: Appears Intact Coordination Fine Motor Movements are Fluid and Coordinated: Yes Motor  Motor Motor: Hemiplegia Motor - Discharge Observations: Pt continues to have balance deficits and slight weakness in RLE vs LLE  Trunk/Postural Assessment  Cervical Assessment Cervical Assessment: Exceptions to WFL Cervical Strength Overall Cervical Strength Comments: pt with forward head posture Postural Control Postural Control: Deficits on evaluation Postural Limitations: Note pt with history of scoliosis and also demonstrates posterior pelvic tilt in sitting with forward shoulders, kyphotic posture.   Extremity/Trunk Assessment RUE Assessment RUE Assessment: Exceptions to WFL RUE Strength RUE Overall Strength: Deficits RUE Overall Strength Comments: PROM WFL; shoulder flexion active movement limited to ~ 30 degrees; active elbow flexion/extension against gravity; grasp good LUE Assessment LUE Assessment: Exceptions to WFL LUE Strength LUE Overall Strength: Deficits;Due to premorbid status LUE Overall Strength Comments: limited shoulder flexion due to poly rhematica; distal ROM and strength WFL's  See FIM for current functional status  ,  08/18/2013, 2:42 PM  

## 2013-08-18 NOTE — Consult Note (Signed)
WOC wound follow up Wound type: skin tear LLE POA, and new skin tear RLE posterior calf (sustained during therapy) Wound bed: LLE lateral: wound bed continues to have some drainage but lessening.  The xeroform is intact to the wound bed.  I trimmed some of the loosening xeroform today and removed the stitch at the lateral aspect of the wound today (per urgent care MD request).  Cleaned wound and recover with new topper layer of xeroform. Kerlix and stockinet.  RLE posterior: clean but dark, moist, non infected  Drainage (amount, consistency, odor) minimal, serosanguinous from both sites, more from the RLE Periwound: LLE intact, skin flap is still very dusky.  I think she may loose some of this skin eventually.  She has ecchymosis over the LLE and RLEand frail skin secondary to her steroid use.  Dressing procedure/placement/frequency:  Continue to change topper xeroform on the LLE every other day or at least 2x wk [facility] wrap with kerlix, secure stockinet-or other non tape source if possible.   Continue soft silicone foam to the RLE posterior, change every 3 days and PRN soilage or loosening.  Would not use any tape or any aggressive adhesive on this patient due to high risk for further skin tears.    Pt planned for discharge to home with [facility] for continued wound assessments and wound care.  Callan Yontz [employer] RN,CWOCN [phone]

## 2013-08-18 NOTE — Progress Notes (Signed)
Subjective/Complaints: 80 y.o. right-handed female with history of atrial fibrillation maintained on aspirin therapy, hypertension, polymyalgia rheumatica. Patient was modified independent prior to admission using a rolling walker inside the home and a cane for community access and still driving. Admitted 08/08/2013 with acute onset of right-sided weakness. MRI of the brain showed acute small infarcts scattered throughout the left frontal parietal lobe as well as remote tiny right cerebellar infarct. MRA of the head with intracranial atherosclerotic type changes. Carotid Dopplers with no ICA stenosis. Echocardiogram with ejection fraction 40% grade 1 diastolic dysfunction. Patient did not receive TPA. Neurology services consulted presently maintained on aspirin 81 mg daily as well as subcutaneous Lovenox for DVT prophylaxis. Patient is tolerating a regular diet  Discussed no driving as well as f/u  Review of Systems - Negative except Hard of hearing Slept well per report.  HOH needs hearing aides Objective: Vital Signs: Blood pressure 171/82, pulse 81, temperature 98.3 F (36.8 C), temperature source Oral, resp. rate 18, weight 52.8 kg (116 lb 6.5 oz), SpO2 97.00%. No results found. Results for orders placed during the hospital encounter of 08/10/13 (from the past 72 hour(s))  CREATININE, SERUM     Status: Abnormal   Collection Time    08/17/13  5:23 AM      Result Value Ref Range   Creatinine, Ser 0.56  0.50 - 1.10 mg/dL   GFR calc non Af Amer 78 (*) >90 mL/min   GFR calc Af Amer >90  >90 mL/min   Comment: (NOTE)     The eGFR has been calculated using the CKD EPI equation.     This calculation has not been validated in all clinical situations.     eGFR's persistently <90 mL/min signify possible Chronic Kidney     Disease.      Physical Exam  Vitals reviewed.  Constitutional: She is oriented to person, place, and time. She appears well-developed.  HENT:  Head: Normocephalic.  Eyes:  EOM are normal.  Neck: Normal range of motion. Neck supple. No thyromegaly present.  Cardiovascular:  Cardiac controlled  Respiratory: Effort normal and breath sounds normal. No respiratory distress.  GI: Soft. Bowel sounds are normal. She exhibits no distension.  Neurological: She is alert and oriented to person, place, and time.  Follows full commands  Skin: Skin is warm and dry.  Ant shin abrasion dry scab Right post leg abrasion dried blood no active bleeding  Psychiatric: She has a normal mood and affect.  4-/5 Bilateral delt , 4/5 bi , tri, grip, HF, KE, ankle Decreased Fine motor RUE Assessment/Plan: 1. Functional deficits secondary to Left fronto parietal infarct which require 3+ hours per day of interdisciplinary therapy in a comprehensive inpatient rehab setting. Physiatrist is providing close team supervision and 24 hour management of active medical problems listed below. Physiatrist and rehab team continue to assess barriers to discharge/monitor patient progress toward functional and medical goals.  FIM: FIM - Bathing Bathing Steps Patient Completed: Chest;Right Arm;Left Arm;Abdomen;Front perineal area;Buttocks;Right upper leg;Left upper leg;Right lower leg (including foot);Left lower leg (including foot) Bathing: 6: More than reasonable amount of time  FIM - Upper Body Dressing/Undressing Upper body dressing/undressing steps patient completed: Thread/unthread left sleeve of front closure shirt/dress;Pull shirt around back of front closure shirt/dress;Button/unbutton shirt;Thread/unthread right sleeve of pullover shirt/dresss;Thread/unthread left sleeve of pullover shirt/dress;Put head through opening of pull over shirt/dress;Pull shirt over trunk;Thread/unthread right sleeve of front closure shirt/dress Upper body dressing/undressing: 6: More than reasonable amount of time FIM - Lower  Body Dressing/Undressing Lower body dressing/undressing steps patient completed:  Thread/unthread right underwear leg;Thread/unthread left underwear leg;Pull underwear up/down;Thread/unthread right pants leg;Thread/unthread left pants leg;Pull pants up/down;Don/Doff right sock;Don/Doff left sock;Don/Doff right shoe;Don/Doff left shoe Lower body dressing/undressing: 6: More than reasonable amount of time  FIM - Toileting Toileting steps completed by patient: Performs perineal hygiene;Adjust clothing after toileting;Adjust clothing prior to toileting Toileting Assistive Devices: Grab bar or rail for support Toileting: 6: Assistive device: No helper  FIM - Radio producer Devices: Grab bars;Walker Toilet Transfers: 6-Assistive device: No helper  FIM - Control and instrumentation engineer Devices: Copy: 6: Assistive device: no helper  FIM - Locomotion: Wheelchair Locomotion: Wheelchair: 0: Activity did not occur FIM - Locomotion: Ambulation Locomotion: Ambulation Assistive Devices: Administrator Ambulation/Gait Assistance: 5: Supervision Locomotion: Ambulation: 6: Travels 150 ft or more with assistive device/no helper  Comprehension Comprehension Mode: Auditory Comprehension: 5-Understands complex 90% of the time/Cues < 10% of the time  Expression Expression Mode: Verbal Expression: 6-Expresses complex ideas: With extra time/assistive device  Social Interaction Social Interaction: 6-Interacts appropriately with others with medication or extra time (anti-anxiety, antidepressant).  Problem Solving Problem Solving: 5-Solves complex 90% of the time/cues < 10% of the time  Memory Memory: 6-Assistive device: No helper  Medical Problem List and Plan:  1. Embolic left frontoparietal infarct  2. DVT Prophylaxis/Anticoagulation: Subcutaneous Lovenox. Monitor platelet counts and any signs of bleeding  3. Pain Management: Tylenol as needed  4. Neuropsych: This patient is capable of making decisions on her  own behalf.  5. Hypertension/atrial fibrillation. Cardiac rate control. Continue Toprol. Systolic elevation this am will review trends 6. Polymyalgia rheumatica. Chronic prednisone 5 mg daily.  7. Hyperlipidemia. Zocor  8. Left lower extremity ant leg wound. Silicone foam dressing change every 3 days as needed do not remove Steri-Strips or xeroform placed in the wound bed RLE post leg wound foam dressing change q3d and prn  LOS (Days) 8 A FACE TO FACE EVALUATION WAS PERFORMED  Krystal Hensley E 08/18/2013, 7:12 AM

## 2013-08-18 NOTE — Discharge Instructions (Signed)
Inpatient Rehab Discharge Instructions  [place] Discharge date and time: No discharge date for patient encounter.   Activities/Precautions/ Functional Status: Activity: activity as tolerated Diet: regular diet Wound Care: none needed Functional status:  ___ No restrictions     ___ Walk up steps independently ___ 24/7 supervision/assistance   ___ Walk up steps with assistance ___ Intermittent supervision/assistance  ___ Bathe/dress independently ___ Walk with walker     ___ Bathe/dress with assistance ___ Walk Independently    ___ Shower independently __x STROKE/TIA DISCHARGE INSTRUCTIONS SMOKING Cigarette smoking nearly doubles your risk of having a stroke & is the single most alterable risk factor  If you smoke or have smoked in the last 12 months, you are advised to quit smoking for your health.  Most of the excess cardiovascular risk related to smoking disappears within a year of stopping.  Ask you doctor about anti-smoking medications  [place] Quit Line: 1-800-QUIT NOW  Free Smoking Cessation Classes [phone]  CHOLESTEROL Know your levels; limit fat & cholesterol in your diet  Lipid Panel     Component Value Date/Time   CHOL 216* 08/09/2013 0326   TRIG 116 08/09/2013 0326   HDL 73 08/09/2013 0326   CHOLHDL 3.0 08/09/2013 0326   VLDL 23 08/09/2013 0326   LDLCALC 120* 08/09/2013 0326      Many patients benefit from treatment even if their cholesterol is at goal.  Goal: Total Cholesterol (CHOL) less than 160  Goal:  Triglycerides (TRIG) less than 150  Goal:  HDL greater than 40  Goal:  LDL (LDLCALC) less than 100   BLOOD PRESSURE American Stroke Association blood pressure target is less that 120/80 mm/Hg  Your discharge blood pressure is:  BP: 171/82 mmHg  Monitor your blood pressure  Limit your salt and alcohol intake  Many individuals will require more than one medication for high blood pressure  DIABETES (A1c is a blood sugar average for last 3 months) Goal  HGBA1c is under 7% (HBGA1c is blood sugar average for last 3 months)  Diabetes: No known diagnosis of diabetes    Lab Results  Component Value Date   HGBA1C 5.9* 08/09/2013     Your HGBA1c can be lowered with medications, healthy diet, and exercise.  Check your blood sugar as directed by your physician  Call your physician if you experience unexplained or low blood sugars.  PHYSICAL ACTIVITY/REHABILITATION Goal is 30 minutes at least 4 days per week  Activity: No driving, Therapies: Physical Therapy: Home Health Return to work:   Activity decreases your risk of heart attack and stroke and makes your heart stronger.  It helps control your weight and blood pressure; helps you relax and can improve your mood.  Participate in a regular exercise program.  Talk with your doctor about the best form of exercise for you (dancing, walking, swimming, cycling).  DIET/WEIGHT Goal is to maintain a healthy weight  Your discharge diet is: Cardiac  liquids Your height is:    Your current weight is: Weight: 52.8 kg (116 lb 6.5 oz) Your Body Mass Index (BMI) is:     Following the type of diet specifically designed for you will help prevent another stroke.  Your goal weight range is:    Your goal Body Mass Index (BMI) is 19-24.  Healthy food habits can help reduce 3 risk factors for stroke:  High cholesterol, hypertension, and excess weight.  RESOURCES Stroke/Support Group:  Call [facility]  PATIENT Stroke warning signs and symptoms How to activate emergency medical system (call 911). Medications prescribed at discharge. Need for follow-up after discharge. Personal risk factors for stroke. Pneumonia vaccine given:  Flu vaccine given:  My questions have been answered, the writing is legible, and I understand these instructions.  I will adhere to these goals & educational materials that have been provided to me after my discharge from the  hospital.    _ Walk with assistance    ___ Shower with assistance ___ No alcohol     ___ Return to work/school ________  Special Instructions: Silicone dressing left posterior calf skin tear change every 3 days as needed loosening    COMMUNITY REFERRALS UPON DISCHARGE:    Home Health:   PT, OT, RN, CNA   Agency:[facility] [phone] Date of last service:08/19/2013  Medical Equipment/Items Ordered:HAS OWN EQUIPMENT     GENERAL COMMUNITY RESOURCES FOR PATIENT/FAMILY: Support Groups:[facility] SUPPORT GROUP   My questions have been answered and I understand these instructions. I will adhere to these goals and the provided educational materials after my discharge from the hospital.  Patient/Caregiver Signature _______________________________ Date __________  Clinician Signature _______________________________________ Date __________  Please bring this form and your medication list with you to all your follow-up doctor's appointments.

## 2013-08-19 DIAGNOSIS — I10 Essential (primary) hypertension: Secondary | ICD-10-CM

## 2013-08-19 DIAGNOSIS — I6340 Cerebral infarction due to embolism of unspecified cerebral artery: Secondary | ICD-10-CM

## 2013-08-19 DIAGNOSIS — M353 Polymyalgia rheumatica: Secondary | ICD-10-CM

## 2013-08-19 DIAGNOSIS — I4891 Unspecified atrial fibrillation: Secondary | ICD-10-CM

## 2013-08-19 MED ORDER — METOPROLOL SUCCINATE ER 25 MG PO TB24: 25.0000 mg | ORAL_TABLET | Freq: Every day | ORAL | Status: DC

## 2013-08-19 MED ORDER — METOPROLOL SUCCINATE ER 50 MG PO TB24: 50.0000 mg | ORAL_TABLET | Freq: Every day | ORAL | Status: DC

## 2013-08-19 MED ORDER — SIMVASTATIN 10 MG PO TABS: 10.0000 mg | ORAL_TABLET | ORAL | Status: DC

## 2013-08-19 MED ORDER — ASPIRIN 81 MG PO CHEW: 81.0000 mg | CHEWABLE_TABLET | Freq: Every day | ORAL | Status: DC

## 2013-08-19 NOTE — Progress Notes (Signed)
Social Work Discharge Note Discharge Note  The overall goal for the admission was met for:   Discharge location: Yes-HOME SON AND DAUGHTER [place] STAYING FOR FOR ONE WEEK-TRANSITION   Length of Stay: Yes-9 DAYS  Discharge activity level: Yes-MOD/I LEVEL  Home/community participation: Yes  Services provided included: MD, RD, PT, OT, SLP, RN, CM, TR, Pharmacy and SW  Financial Services: Medicare and Private Insurance: [employer]  Follow-up services arranged: Home Health: [facility]-PT,OT,RN,AIDE and Patient/Family request agency HH: HAS HAD THEM BEFORE, DME: NO NEEDS  Comments (or additional information):FAMILY EDUCATION COMPLETED, SON AND DAUGHTER [place] COMING TO STY WITH PT FOR ONE WEEK UNTIL DAUGHTER RETURNS FORM OUT OF TOWN PT GIVEN TRANSPORTATION RESOURCES AND WILL FOLLOW UP WITH.  ALSO HAS PRIVATE DUTY LIST  Patient/Family verbalized understanding of follow-up arrangements: Yes  Individual responsible for coordination of the follow-up plan: SELF & CHARLIE-DAUGHTER  Confirmed correct DME delivered: Elease Hashimoto 08/19/2013    Elease Hashimoto

## 2013-08-19 NOTE — Progress Notes (Signed)
Subjective/Complaints: Slept ok Discussed activity recommendations- no driving , use walker at all times MD f/us etc  Objective: Vital Signs: Blood pressure 166/88, pulse 87, temperature 98 F (36.7 C), temperature source Oral, resp. rate 18, weight 52.8 kg (116 lb 6.5 oz), SpO2 94.00%. No results found. Results for orders placed during the hospital encounter of 08/10/13 (from the past 72 hour(s))  CREATININE, SERUM     Status: Abnormal   Collection Time    08/17/13  5:23 AM      Result Value Ref Range   Creatinine, Ser 0.56  0.50 - 1.10 mg/dL   GFR calc non Af Amer 78 (*) >90 mL/min   GFR calc Af Amer >90  >90 mL/min   Comment: (NOTE)     The eGFR has been calculated using the CKD EPI equation.     This calculation has not been validated in all clinical situations.     eGFR's persistently <90 mL/min signify possible Chronic Kidney     Disease.     HEENT: normal Cardio: RRR and no murmur Resp: CTA B/L GI: BS positive Extremity:  No Edema Skin:   Wound R post leg, Left ant shin Neuro: Alert/Oriented, Normal Motor and Abnormal FMC Ataxic/ dec FMC Musc/Skel:  Other Bilateral shoulder contracture Gen NAD   Assessment/Plan: 1. Functional deficits secondary to CVA which require 3+ hours per day of interdisciplinary therapy in a comprehensive inpatient rehab setting. Stable for D/C today F/u PCP in 1-2 weeks F/u PM&R 3 weeks See D/C summary See D/C instructions FIM: FIM - Bathing Bathing Steps Patient Completed: Chest;Right Arm;Left Arm;Abdomen;Front perineal area;Buttocks;Right upper leg;Left upper leg;Right lower leg (including foot);Left lower leg (including foot) Bathing: 6: More than reasonable amount of time  FIM - Upper Body Dressing/Undressing Upper body dressing/undressing steps patient completed: Thread/unthread left sleeve of front closure shirt/dress;Pull shirt around back of front closure shirt/dress;Button/unbutton shirt;Thread/unthread right sleeve of pullover  shirt/dresss;Thread/unthread left sleeve of pullover shirt/dress;Put head through opening of pull over shirt/dress;Pull shirt over trunk;Thread/unthread right sleeve of front closure shirt/dress Upper body dressing/undressing: 6: More than reasonable amount of time FIM - Lower Body Dressing/Undressing Lower body dressing/undressing steps patient completed: Thread/unthread right underwear leg;Thread/unthread left underwear leg;Pull underwear up/down;Thread/unthread right pants leg;Thread/unthread left pants leg;Pull pants up/down;Don/Doff right sock;Don/Doff left sock;Don/Doff right shoe;Don/Doff left shoe Lower body dressing/undressing: 6: More than reasonable amount of time  FIM - Toileting Toileting steps completed by patient: Adjust clothing prior to toileting;Performs perineal hygiene Toileting Assistive Devices: Grab bar or rail for support Toileting: 6: Assistive device: No helper  FIM - Radio producer Devices: Grab bars;Walker Toilet Transfers: 6-Assistive device: No helper  FIM - Control and instrumentation engineer Devices: Copy: 6: Supine > Sit: No assist;6: Sit > Supine: No assist;6: Bed > Chair or W/C: No assist;6: Chair or W/C > Bed: No assist  FIM - Locomotion: Wheelchair Locomotion: Wheelchair: 0: Activity did not occur (pt ambulatory) FIM - Locomotion: Ambulation Locomotion: Ambulation Assistive Devices: Administrator Ambulation/Gait Assistance: 6: Modified independent (Device/Increase time) Locomotion: Ambulation: 6: Travels 150 ft or more independently/takes more than reasonable amount of time  Comprehension Comprehension Mode: Auditory Comprehension: 5-Understands complex 90% of the time/Cues < 10% of the time  Expression Expression Mode: Verbal Expression: 6-Expresses complex ideas: With extra time/assistive device  Social Interaction Social Interaction: 6-Interacts appropriately with others with  medication or extra time (anti-anxiety, antidepressant).  Problem Solving Problem Solving: 5-Solves complex 90% of the time/cues < 10%  of the time  Memory Memory: 6-Assistive device: No helper  Transition to [name] PT,OT,RN  LOS (Days) 9 A FACE TO FACE EVALUATION WAS PERFORMED  Krystal Hensley E 08/19/2013, 6:44 AM

## 2013-08-19 NOTE — Progress Notes (Signed)
Pt discharged at 66 with family to home. Discharge instructions given to pt and familt with verbal understanding.

## 2013-08-19 NOTE — Discharge Summary (Signed)
Krystal Hensley, DROHAN NO.:  [id number]  MEDICAL RECORD NO.:  [id number]  LOCATION:  [id number]                        FACILITY:  [facility]  PHYSICIAN:  Charlett Blake, M.D.DATE OF BIRTH:  1918-01-30  DATE OF ADMISSION:  08/10/2013 DATE OF DISCHARGE:                              DISCHARGE SUMMARY   DISCHARGE DIAGNOSES: 1. Left frontoparietal infarction. 2. Subcutaneous Lovenox for deep vein thrombosis prophylaxis. 3. Pain management. 4. Hypertension with atrial fibrillation. 5. Polymyalgia rheumatica. 6. Hyperlipidemia. 7. Left lower extremity wound.  HISTORY OF PRESENT ILLNESS:  This is a 80 year old right-handed female with history of atrial fibrillation maintained on aspirin therapy, who was modified independent prior to admission, admitted August 08, 2013, with acute onset of right-sided weakness.  MRI of the brain showed acute small infarcts scattered throughout the left frontal parietal lobe as well as remote tiny right cerebellar infarct.  MRA of the head with intracranial atherosclerotic-type changes.  Carotid Dopplers with no ICA stenosis.  Echocardiogram with ejection fraction of 62% grade 1 diastolic dysfunction.  The patient did not receive t-PA.  Neurology Service was consulted, maintained on aspirin therapy, as well as subcutaneous Lovenox for DVT prophylaxis.  She was tolerating a regular diet.  Physical and occupational therapy ongoing.  The patient was admitted for a comprehensive rehab program.  PAST MEDICAL HISTORY:  See discharge diagnoses.  SOCIAL HISTORY:  Lives alone.  FUNCTIONAL HISTORY:  Prior to admission independent with a rollating walker.  Still driving.  FUNCTIONAL STATUS UPON ADMISSION TO REHAB SERVICES:  Ambulating 75 feet with decreased gait, velocity, progressing with right leg forward during gait assistance needed to steady the patient at the trunk for balance.  PHYSICAL EXAMINATION:  VITAL SIGNS:  Blood pressure 168/79,  pulse 74, temperature 97.6, respirations 19. GENERAL:  This was an alert female, oriented x3.  Well developed. HEENT:  Pupils round and reactive to light. LUNGS:  Clear to auscultation. CARDIAC:  Rate controlled. ABDOMEN:  Soft, nontender.  Good bowel sounds.  Left anterior shin with large abrasion without odor.  REHABILITATION HOSPITAL COURSE:  The patient was admitted to inpatient rehab services with therapies initiated on a 3-hour daily basis consisting of physical therapy, occupational therapy, and rehabilitation nursing.  The following issues were addressed during the patient's rehabilitation stay.  Pertaining to Ms. Streight's embolic left frontoparietal infarct remained stable, maintained on aspirin therapy. Subcutaneous Lovenox for DVT prophylaxis and no bleeding episodes. Blood pressures and heart rate controlled on Toprol with no chest pain or shortness of breath.  She remained on chronic prednisone therapy for polymyalgia rheumatica with no flare ups.  Zocor ongoing for hyperlipidemia.  She did have a left lower extremity  wound with generalized wound care as advised showing no signs of infection.  The patient received weekly collaborative interdisciplinary team conferences to discuss estimated length of stay, family teaching, and any barriers to discharge.  She was ambulating 200 feet with a rollating walker at modified independent level, performing all reaching with modified independent and no loss of balance during activities.  ACTIVITIES:  Daily living, retraining with focus on safety and mobility at [facility] techniques to complete bathing and dressing and shower transfers.  She was  able to bath, sit to stand level and room shower with distant supervision.  She was discharged to home after family teaching completed.  DISCHARGE MEDICATIONS: 1. Aspirin 81 mg p.o. daily. 2. Toprol-XL 25 mg p.o. at bedtime as well as Toprol-XL 50 mg p.o.     daily. 3. Prednisone 5 mg p.o.  daily. 4. Zocor 10 mg p.o. every other day.  DIET:  Regular.  SPECIAL INSTRUCTIONS:  The patient would follow up Dr. Alysia Penna at the Outpatient Rehab Service office as directed per Dr. Antony Contras, Neurology Services 1 month call for appointment.  Dr. Lajean Manes, medical management.     Lauraine Rinne, P.A.   ______________________________ Charlett Blake, M.D.    DA/MEDQ  D:  08/18/2013  T:  08/18/2013  Job:  [id number]  cc:   Charlett Blake, M.D. Pramod P. Leonie Man, MD Hal T. Stoneking, M.D.

## 2013-09-05 DIAGNOSIS — I4891 Unspecified atrial fibrillation: Secondary | ICD-10-CM

## 2013-09-05 DIAGNOSIS — I10 Essential (primary) hypertension: Secondary | ICD-10-CM

## 2013-09-05 DIAGNOSIS — IMO0002 Reserved for concepts with insufficient information to code with codable children: Secondary | ICD-10-CM

## 2013-09-05 DIAGNOSIS — I69959 Hemiplegia and hemiparesis following unspecified cerebrovascular disease affecting unspecified side: Secondary | ICD-10-CM

## 2013-09-16 ENCOUNTER — Encounter: Payer: Self-pay | Admitting: Physical Medicine & Rehabilitation

## 2013-09-16 ENCOUNTER — Encounter: Payer: Medicare Other | Attending: Physical Medicine & Rehabilitation

## 2013-09-16 ENCOUNTER — Ambulatory Visit (HOSPITAL_BASED_OUTPATIENT_CLINIC_OR_DEPARTMENT_OTHER): Payer: Medicare Other | Admitting: Physical Medicine & Rehabilitation

## 2013-09-16 VITALS — BP 142/75 | HR 84 | Resp 14 | Ht 60.0 in | Wt 115.0 lb

## 2013-09-16 DIAGNOSIS — I6350 Cerebral infarction due to unspecified occlusion or stenosis of unspecified cerebral artery: Secondary | ICD-10-CM

## 2013-09-16 DIAGNOSIS — I10 Essential (primary) hypertension: Secondary | ICD-10-CM | POA: Insufficient documentation

## 2013-09-16 DIAGNOSIS — I639 Cerebral infarction, unspecified: Secondary | ICD-10-CM

## 2013-09-16 DIAGNOSIS — I69959 Hemiplegia and hemiparesis following unspecified cerebrovascular disease affecting unspecified side: Secondary | ICD-10-CM | POA: Insufficient documentation

## 2013-09-16 DIAGNOSIS — Z7982 Long term (current) use of aspirin: Secondary | ICD-10-CM | POA: Insufficient documentation

## 2013-09-16 DIAGNOSIS — I4891 Unspecified atrial fibrillation: Secondary | ICD-10-CM | POA: Insufficient documentation

## 2013-09-16 NOTE — Patient Instructions (Signed)
Home exercise program Followup with me in one month If no improvements in 1 month  recommend outpatient therapy program

## 2013-09-16 NOTE — Progress Notes (Signed)
Subjective:    Patient ID: Krystal Hensley, female    DOB: January 14, 1918, 80 y.o.   MRN: [id number]  HPI This is a 80 year old right-handed female  with history of atrial fibrillation maintained on aspirin therapy, who  was modified independent prior to admission, admitted August 08, 2013,  with acute onset of right-sided weakness. MRI of the brain showed acute  small infarcts scattered throughout the left frontal parietal lobe as  well as remote tiny right cerebellar infarct. MRA of the head with  intracranial atherosclerotic-type changes. Carotid Dopplers with no ICA  stenosis. Echocardiogram with ejection fraction of 50% grade 1  diastolic dysfunction. The patient did not receive t-PA. Neurology  Service was consulted, maintained on aspirin therapy, as well as  subcutaneous Lovenox for DVT prophylaxis. She was tolerating a regular  diet. D/Ced amb with walker, now with cane OT did home modification HHPT,OT Saw PCP yesterday Pain Inventory Average Pain 0 Pain Right Now 0 My pain is n/a  In the last 24 hours, has pain interfered with the following? General activity 0 Relation with others 0 Enjoyment of life 0 What TIME of day is your pain at its worst? n/a Sleep (in general) Fair  Pain is worse with: n/a Pain improves with: n/a Relief from Meds: n/a  Mobility use a cane use a walker  Function retired I need assistance with the following:  meal prep  Neuro/Psych weakness  Prior Studies Any changes since last visit?  no  Physicians involved in your care Any changes since last visit?  no   History reviewed. No pertinent family history. History   Social History  . Marital Status: Widowed    Spouse Name: N/A    Number of Children: N/A  . Years of Education: N/A   Social History Main Topics  . Smoking status: Never Smoker   . Smokeless tobacco: None  . Alcohol Use: No  . Drug Use: No  . Sexual Activity: None   Other Topics Concern  . None    Social History Narrative  . None   Past Surgical History  Procedure Laterality Date  . Colon surgery    . Cesarean section    . Bilateral oophorectomy     Past Medical History  Diagnosis Date  . Hypertension   . Arthritis   . Atrial fibrillation   . Back pain   . Cancer     colon  . Polymyalgia rheumatica   . Hypoglycemia   . HTN (hypertension) 08/08/2013  . A-fib 08/08/2013  . Stroke    BP 142/75  Pulse 84  Resp 14  Ht 5' (1.524 m)  Wt 115 lb (52.164 kg)  BMI 22.46 kg/m2  SpO2 97%  Opioid Risk Score:   Fall Risk Score: High Fall Risk (>13 points) (patient educated handout given)   Review of Systems  Neurological: Positive for weakness.  All other systems reviewed and are negative.       Objective:   Physical Exam  Motor strength is 5/5 in the left deltoid, bicep, tricep, grip, hip flexor, knee extensors, ankle dorsiflexor and plantar flexion 4/5 in the right bicep triceps grip, 3 minus at the deltoid 4/5 in the right hip flexor, knee extensors, ankle dorsiflexor and plantar flexion Sensory intact to light touch in the upper and lower limbs No evidence of facial droop No evidence of dysarthria or aphasia Ambulates without evidence of toe drag or knee instability. Uses a straight cane. Wide base of support. Valgus deformity  of both knees.      Assessment & Plan:  1. 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 Has made good progress with home health physical and occupational therapy. Patient will continue her home exercise program for one month. If she is not made any further improvements, would refer to the outpatient physical and occupational therapy. Patient does not wish to go to outpatient therapy right away. Discuss with patient and daughter

## 2013-10-10 ENCOUNTER — Encounter: Payer: Self-pay | Admitting: Podiatry

## 2013-10-10 ENCOUNTER — Ambulatory Visit (INDEPENDENT_AMBULATORY_CARE_PROVIDER_SITE_OTHER): Payer: Medicare Other | Admitting: Podiatry

## 2013-10-10 VITALS — BP 146/75 | HR 93 | Resp 16

## 2013-10-10 DIAGNOSIS — M79609 Pain in unspecified limb: Secondary | ICD-10-CM

## 2013-10-10 DIAGNOSIS — B351 Tinea unguium: Secondary | ICD-10-CM

## 2013-10-10 NOTE — Progress Notes (Signed)
Subjective:     Patient ID: Krystal Hensley, female   DOB: December 09, 1917, 80 y.o.   MRN: [id number]  HPI patient presents with thick painful nailbeds 1-5 both feet that she's not able to take care of   Review of Systems     Objective:   Physical Exam Neurovascular status intact with brittle painful yellow discolored nailbeds 1-5 both feet    Assessment:     Painful mycotic nail infection 1-5 both feet nailbeds    Plan:     Debridement painful nail bed 1-5 both feet with no iatrogenic bleeding noted

## 2013-10-24 ENCOUNTER — Ambulatory Visit (HOSPITAL_BASED_OUTPATIENT_CLINIC_OR_DEPARTMENT_OTHER): Payer: Medicare Other | Admitting: Physical Medicine & Rehabilitation

## 2013-10-24 ENCOUNTER — Encounter: Payer: Self-pay | Admitting: Physical Medicine & Rehabilitation

## 2013-10-24 ENCOUNTER — Encounter: Payer: Medicare Other | Attending: Physical Medicine & Rehabilitation

## 2013-10-24 VITALS — BP 150/76 | HR 81 | Resp 14 | Ht 60.0 in | Wt 115.0 lb

## 2013-10-24 DIAGNOSIS — Z7982 Long term (current) use of aspirin: Secondary | ICD-10-CM | POA: Insufficient documentation

## 2013-10-24 DIAGNOSIS — I4891 Unspecified atrial fibrillation: Secondary | ICD-10-CM | POA: Insufficient documentation

## 2013-10-24 DIAGNOSIS — I6350 Cerebral infarction due to unspecified occlusion or stenosis of unspecified cerebral artery: Secondary | ICD-10-CM

## 2013-10-24 DIAGNOSIS — I10 Essential (primary) hypertension: Secondary | ICD-10-CM | POA: Insufficient documentation

## 2013-10-24 DIAGNOSIS — G8110 Spastic hemiplegia affecting unspecified side: Secondary | ICD-10-CM

## 2013-10-24 DIAGNOSIS — I69959 Hemiplegia and hemiparesis following unspecified cerebrovascular disease affecting unspecified side: Secondary | ICD-10-CM | POA: Insufficient documentation

## 2013-10-24 NOTE — Patient Instructions (Addendum)
F/u Dr Felipa Eth Rec no driving at the current time, get driving ok from PCP I rec neuro f/u and resuming a cholesterol lowering medication

## 2013-10-24 NOTE — Progress Notes (Signed)
Subjective:    Patient ID: Krystal Hensley, female    DOB: [birth year], 80 y.o.   MRN: [id number] This is a 80 year old right-handed female  with history of atrial fibrillation maintained on aspirin therapy, who  was modified independent prior to admission, admitted August 08, 2013,  with acute onset of right-sided weakness. MRI of the brain showed acute  small infarcts scattered throughout the left frontal parietal lobe as  well as remote tiny right cerebellar infarct. MRA of the head with  intracranial atherosclerotic-type changes. Carotid Dopplers with no ICA  stenosis. Echocardiogram with ejection fraction of 38% grade 1  diastolic dysfunction. The patient did not receive t-PA. Neurology  Service was consulted, maintained on aspirin therapy, as well as  subcutaneous Lovenox for DVT prophylaxis. She was tolerating a regular  diet.  HPI Was ambulating with a cane last visit: 13th 2015. Was receiving home health services last visit Recommendation was to transition to outpatient therapy however the patient did not want to do this unless she was not making any further progress  No falls Does HEP, uses cane on porch but otherwise NAD Asking about using oven Hot flashes but no sweating, discussed chronic prednisone Hx of muscle aches No memory problems occ word finding deficits Pain Inventory Average Pain 0 Pain Right Now 0 My pain is no pain  In the last 24 hours, has pain interfered with the following? General activity 0 Relation with others 0 Enjoyment of life 0 What TIME of day is your pain at its worst? no pain Sleep (in general) Good  Pain is worse with: no pain Pain improves with: no pain Relief from Meds: no pain  Mobility walk without assistance use a cane use a walker how many minutes can you walk? 10-15  Function retired  Neuro/Psych No problems in this area  Prior Studies Any changes since last visit?  no  Physicians involved in your care Any  changes since last visit?  no   History reviewed. No pertinent family history. History   Social History  . Marital Status: Widowed    Spouse Name: N/A    Number of Children: N/A  . Years of Education: N/A   Social History Main Topics  . Smoking status: Never Smoker   . Smokeless tobacco: None  . Alcohol Use: No  . Drug Use: No  . Sexual Activity: None   Other Topics Concern  . None   Social History Narrative  . None   Past Surgical History  Procedure Laterality Date  . Colon surgery    . Cesarean section    . Bilateral oophorectomy     Past Medical History  Diagnosis Date  . Hypertension   . Arthritis   . Atrial fibrillation   . Back pain   . Cancer     colon  . Polymyalgia rheumatica   . Hypoglycemia   . HTN (hypertension) 08/08/2013  . A-fib 08/08/2013  . Stroke    BP 150/76  Pulse 81  Resp 14  Ht 5' (1.524 m)  Wt 115 lb (52.164 kg)  BMI 22.46 kg/m2  SpO2 93%  Opioid Risk Score:   Fall Risk Score: Moderate Fall Risk (6-13 points) (educated and handout given at previous visit)  Review of Systems  Constitutional: Positive for diaphoresis.  All other systems reviewed and are negative.      Objective:   Physical Exam  Lumbar scoliosis convex to the left Left ankle DF weakness 4/5  4+/5 in bilateral  deltoid, bicep, tricep, grip, hip flexors, knee extensors and right ankle dorsiflexor Sensation equal to light touch in the upper and lower extremities     Assessment & Plan:  1. 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 Has made good progress with home health physical and occupational therapy. Patient will continue her home exercise program for one   OK for using oven at home Recommended no driving, patient will follow up with primary care physician to discuss this in one month, I also recommend neurology followup she has questions about secondary stroke prevention I recommended staying on her aspirin but also talking to her  primary care physician regarding cholesterol lowering agent that she may be held to tolerate better than Zocor

## 2014-01-02 ENCOUNTER — Encounter: Payer: Self-pay | Admitting: Podiatry

## 2014-01-02 ENCOUNTER — Ambulatory Visit (INDEPENDENT_AMBULATORY_CARE_PROVIDER_SITE_OTHER): Payer: Medicare Other | Admitting: Podiatry

## 2014-01-02 DIAGNOSIS — B351 Tinea unguium: Secondary | ICD-10-CM

## 2014-01-02 DIAGNOSIS — M2010 Hallux valgus (acquired), unspecified foot: Secondary | ICD-10-CM

## 2014-01-02 DIAGNOSIS — M79609 Pain in unspecified limb: Secondary | ICD-10-CM

## 2014-01-02 DIAGNOSIS — I6350 Cerebral infarction due to unspecified occlusion or stenosis of unspecified cerebral artery: Secondary | ICD-10-CM

## 2014-01-02 DIAGNOSIS — M79673 Pain in unspecified foot: Secondary | ICD-10-CM

## 2014-01-02 DIAGNOSIS — L84 Corns and callosities: Secondary | ICD-10-CM

## 2014-01-03 NOTE — Progress Notes (Signed)
Subjective:     Patient ID: Krystal Hensley, female   DOB: Feb 26, 1918, 80 y.o.   MRN: [id number]  HPI patient presents with thick yellow nailbeds 1-5 both feet that are painful   Review of Systems     Objective:   Physical Exam Neurovascular status unchanged with thick yellow brittle nailbeds 1-5 both feet that are painful when palpated    Assessment:     Mycotic nail infection with pain 1-5 of both feet    Plan:     Debride painful nailbeds 1-5 both feet with no iatrogenic bleeding noted

## 2014-01-14 ENCOUNTER — Emergency Department (HOSPITAL_COMMUNITY): Payer: Medicare Other

## 2014-01-14 ENCOUNTER — Ambulatory Visit (INDEPENDENT_AMBULATORY_CARE_PROVIDER_SITE_OTHER): Payer: Medicare Other | Admitting: Family Medicine

## 2014-01-14 ENCOUNTER — Encounter (HOSPITAL_COMMUNITY): Payer: Self-pay | Admitting: Emergency Medicine

## 2014-01-14 ENCOUNTER — Observation Stay (HOSPITAL_COMMUNITY)
Admission: EM | Admit: 2014-01-14 | Discharge: 2014-01-15 | Disposition: A | Discharge status: Home / Self Care | Payer: Medicare Other | Attending: Internal Medicine | Admitting: Internal Medicine

## 2014-01-14 VITALS — BP 172/102 | HR 100 | Temp 97.5°F | Resp 20 | Ht 61.0 in | Wt 115.0 lb

## 2014-01-14 DIAGNOSIS — R0602 Shortness of breath: Secondary | ICD-10-CM

## 2014-01-14 DIAGNOSIS — I6350 Cerebral infarction due to unspecified occlusion or stenosis of unspecified cerebral artery: Secondary | ICD-10-CM

## 2014-01-14 DIAGNOSIS — M353 Polymyalgia rheumatica: Secondary | ICD-10-CM

## 2014-01-14 DIAGNOSIS — I639 Cerebral infarction, unspecified: Secondary | ICD-10-CM

## 2014-01-14 DIAGNOSIS — Z7982 Long term (current) use of aspirin: Secondary | ICD-10-CM | POA: Insufficient documentation

## 2014-01-14 DIAGNOSIS — I480 Paroxysmal atrial fibrillation: Secondary | ICD-10-CM

## 2014-01-14 DIAGNOSIS — R0789 Other chest pain: Secondary | ICD-10-CM

## 2014-01-14 DIAGNOSIS — R079 Chest pain, unspecified: Principal | ICD-10-CM | POA: Insufficient documentation

## 2014-01-14 DIAGNOSIS — I10 Essential (primary) hypertension: Secondary | ICD-10-CM

## 2014-01-14 DIAGNOSIS — R29898 Other symptoms and signs involving the musculoskeletal system: Secondary | ICD-10-CM

## 2014-01-14 DIAGNOSIS — IMO0002 Reserved for concepts with insufficient information to code with codable children: Secondary | ICD-10-CM | POA: Insufficient documentation

## 2014-01-14 DIAGNOSIS — Z8673 Personal history of transient ischemic attack (TIA), and cerebral infarction without residual deficits: Secondary | ICD-10-CM | POA: Insufficient documentation

## 2014-01-14 DIAGNOSIS — R9431 Abnormal electrocardiogram [ECG] [EKG]: Secondary | ICD-10-CM

## 2014-01-14 DIAGNOSIS — I482 Chronic atrial fibrillation, unspecified: Secondary | ICD-10-CM

## 2014-01-14 DIAGNOSIS — I4891 Unspecified atrial fibrillation: Secondary | ICD-10-CM | POA: Insufficient documentation

## 2014-01-14 DIAGNOSIS — G8110 Spastic hemiplegia affecting unspecified side: Secondary | ICD-10-CM

## 2014-01-14 DIAGNOSIS — M8448XS Pathological fracture, other site, sequela: Secondary | ICD-10-CM

## 2014-01-14 DIAGNOSIS — R7989 Other specified abnormal findings of blood chemistry: Secondary | ICD-10-CM

## 2014-01-14 DIAGNOSIS — S2239XA Fracture of one rib, unspecified side, initial encounter for closed fracture: Secondary | ICD-10-CM

## 2014-01-14 LAB — GLUCOSE, CAPILLARY: Glucose-Capillary: 115 mg/dL — ABNORMAL HIGH (ref 70–99)

## 2014-01-14 LAB — CBC WITH DIFFERENTIAL/PLATELET
Basophils Absolute: 0 10*3/uL (ref 0.0–0.1)
Basophils Relative: 0 % (ref 0–1)
EOS ABS: 0.1 10*3/uL (ref 0.0–0.7)
EOS PCT: 1 % (ref 0–5)
HCT: 41.3 % (ref 36.0–46.0)
Hemoglobin: 13.8 g/dL (ref 12.0–15.0)
Lymphocytes Relative: 6 % — ABNORMAL LOW (ref 12–46)
Lymphs Abs: 0.7 10*3/uL (ref 0.7–4.0)
MCH: 32.9 pg (ref 26.0–34.0)
MCHC: 33.4 g/dL (ref 30.0–36.0)
MCV: 98.3 fL (ref 78.0–100.0)
Monocytes Absolute: 0.9 10*3/uL (ref 0.1–1.0)
Monocytes Relative: 8 % (ref 3–12)
NEUTROS PCT: 85 % — AB (ref 43–77)
Neutro Abs: 9.8 10*3/uL — ABNORMAL HIGH (ref 1.7–7.7)
PLATELETS: 226 10*3/uL (ref 150–400)
RBC: 4.2 MIL/uL (ref 3.87–5.11)
RDW: 14 % (ref 11.5–15.5)
WBC: 11.4 10*3/uL — ABNORMAL HIGH (ref 4.0–10.5)

## 2014-01-14 LAB — BASIC METABOLIC PANEL
ANION GAP: 12 (ref 5–15)
BUN: 11 mg/dL (ref 6–23)
CO2: 26 mEq/L (ref 19–32)
Calcium: 9 mg/dL (ref 8.4–10.5)
Chloride: 100 mEq/L (ref 96–112)
Creatinine, Ser: 0.54 mg/dL (ref 0.50–1.10)
GFR calc Af Amer: >90 mL/min (ref >90)
GFR, EST NON AFRICAN AMERICAN: 79 mL/min — AB (ref >90)
Glucose, Bld: 114 mg/dL — ABNORMAL HIGH (ref 70–99)
POTASSIUM: 4.4 meq/L (ref 3.7–5.3)
SODIUM: 138 meq/L (ref 137–147)

## 2014-01-14 LAB — PRO B NATRIURETIC PEPTIDE: Pro B Natriuretic peptide (BNP): 1065 pg/mL — ABNORMAL HIGH (ref 0–450)

## 2014-01-14 LAB — TROPONIN I: Troponin I: <0.3 ng/mL (ref <0.30)

## 2014-01-14 MED ORDER — ACETAMINOPHEN 325 MG PO TABS: 650.0000 mg | ORAL_TABLET | ORAL | Status: DC | PRN

## 2014-01-14 MED ORDER — METOPROLOL SUCCINATE ER 50 MG PO TB24
50.0000 mg | ORAL_TABLET | Freq: Every day | ORAL | Status: DC
  Administered 2014-01-15: 50 mg via ORAL
  Filled 2014-01-14 (×2): qty 1

## 2014-01-14 MED ORDER — NITROGLYCERIN 0.3 MG SL SUBL
0.4000 mg | SUBLINGUAL_TABLET | Freq: Once | SUBLINGUAL | Status: AC
  Administered 2014-01-14: 0.4 mg via SUBLINGUAL

## 2014-01-14 MED ORDER — HEPARIN SODIUM (PORCINE) 5000 UNIT/ML IJ SOLN
5000.0000 [IU] | Freq: Three times a day (TID) | INTRAMUSCULAR | Status: DC
  Administered 2014-01-14 – 2014-01-15 (×2): 5000 [IU] via SUBCUTANEOUS
  Filled 2014-01-14 (×4): qty 1

## 2014-01-14 MED ORDER — PREDNISONE 5 MG PO TABS
5.0000 mg | ORAL_TABLET | Freq: Every day | ORAL | Status: DC
  Administered 2014-01-15: 5 mg via ORAL
  Filled 2014-01-14: qty 1

## 2014-01-14 MED ORDER — HALOPERIDOL LACTATE 5 MG/ML IJ SOLN: 1.0000 mg | Freq: Four times a day (QID) | INTRAMUSCULAR | Status: DC | PRN

## 2014-01-14 MED ORDER — NITROGLYCERIN 2 % TD OINT
0.5000 [in_us] | TOPICAL_OINTMENT | Freq: Once | TRANSDERMAL | Status: AC
  Administered 2014-01-14: 0.5 [in_us] via TOPICAL
  Filled 2014-01-14: qty 1

## 2014-01-14 MED ORDER — ATORVASTATIN CALCIUM 40 MG PO TABS
40.0000 mg | ORAL_TABLET | Freq: Every day | ORAL | Status: DC
  Filled 2014-01-14 (×2): qty 1

## 2014-01-14 MED ORDER — METOPROLOL SUCCINATE ER 25 MG PO TB24
25.0000 mg | ORAL_TABLET | Freq: Every day | ORAL | Status: DC
  Administered 2014-01-14: 25 mg via ORAL
  Filled 2014-01-14 (×2): qty 1

## 2014-01-14 MED ORDER — OMEPRAZOLE MAGNESIUM 20 MG PO TBEC: 20.0000 mg | DELAYED_RELEASE_TABLET | ORAL | Status: DC

## 2014-01-14 MED ORDER — ACETAMINOPHEN 325 MG PO TABS
650.0000 mg | ORAL_TABLET | Freq: Four times a day (QID) | ORAL | Status: DC | PRN
  Administered 2014-01-14 (×2): 650 mg via ORAL
  Filled 2014-01-14 (×2): qty 2

## 2014-01-14 MED ORDER — PANTOPRAZOLE SODIUM 40 MG PO TBEC
40.0000 mg | DELAYED_RELEASE_TABLET | ORAL | Status: DC
  Administered 2014-01-15: 40 mg via ORAL
  Filled 2014-01-14: qty 1

## 2014-01-14 MED ORDER — NITROGLYCERIN 0.4 MG SL SUBL: 0.4000 mg | SUBLINGUAL_TABLET | Freq: Once | SUBLINGUAL | Status: DC

## 2014-01-14 MED ORDER — ONDANSETRON HCL 4 MG/2ML IJ SOLN: 4.0000 mg | Freq: Four times a day (QID) | INTRAMUSCULAR | Status: DC | PRN

## 2014-01-14 MED ORDER — ASPIRIN 81 MG PO CHEW
243.0000 mg | CHEWABLE_TABLET | Freq: Once | ORAL | Status: AC
  Administered 2014-01-14: 243 mg via ORAL

## 2014-01-14 NOTE — ED Notes (Signed)
To ED via [facility] from [facility] for eval of CP. UCC report an abnormal ECG. Pt alert and oriented on arrival. Talking and joking with staff. Skin w/d. resp e/u. Pt went to [facility] this am for not feeling well since this am around 3am.

## 2014-01-14 NOTE — ED Provider Notes (Signed)
Medical screening examination/treatment/procedure(s) were conducted as a shared visit with non-physician practitioner(s) and myself.  I personally evaluated the patient during the encounter.   EKG Interpretation   Date/Time:  Saturday January 14 2014 10:32:07 EDT Ventricular Rate:  88 PR Interval:  168 QRS Duration: 106 QT Interval:  392 QTC Calculation: 474 R Axis:   -173 Text Interpretation:  Sinus rhythm Ventricular premature complex Aberrant  conduction of SV complex(es) Probable anteroseptal infarct, recent Lateral  leads are also involved similar to 08/08/13 Confirmed by HORTON  MD,  [place] ([id number]) on 01/14/2014 10:42:55 AM      See separate note.  Merryl Hacker, MD 01/14/14 [phone]

## 2014-01-14 NOTE — ED Notes (Signed)
PA ordered additional 0.5 inch nitro paste for pt's chest pressure

## 2014-01-14 NOTE — ED Provider Notes (Signed)
Medical screening examination/treatment/procedure(s) were conducted as a shared visit with non-physician practitioner(s) and myself.  I personally evaluated the patient during the encounter.   EKG Interpretation   Date/Time:  Saturday January 14 2014 10:32:07 EDT Ventricular Rate:  88 PR Interval:  168 QRS Duration: 106 QT Interval:  392 QTC Calculation: 474 R Axis:   -173 Text Interpretation:  Sinus rhythm Ventricular premature complex Aberrant  conduction of SV complex(es) Probable anteroseptal infarct, recent Lateral  leads are also involved similar to 08/08/13 Confirmed by Caroll Cunnington  MD,  [place] ([id number]) on 01/14/2014 10:42:55 AM      Patient presents with chest pain.  Was evaluated at urgent care. Started having chest pain this morning. Was evaluated at urgent care. Was found to have isolated ST elevation in V2 with ST depressions laterally which appear to be new. The physician was worried and sent her here by EMS. Currently patient is complaining only of a little bit of "heaviness." She is otherwise nontoxic on exam. Blood pressures 172/102. Patient does state that her pain got better with nitroglycerin.  Will give her nitroglycerin paste. She started a full dose aspirin. Repeat EKG with improvement of lateral T wave inversions and does not meet STEMI criteria. Workup initiated.  Trop neg.  Patient admitted for CP rule-out.  Merryl Hacker, MD 01/14/14 [phone]

## 2014-01-14 NOTE — ED Notes (Signed)
Heart healthy diet tray ordered for pt

## 2014-01-14 NOTE — ED Notes (Signed)
1 nitro and 324mg  ASA given PTA by [facility]

## 2014-01-14 NOTE — Consult Note (Signed)
CARDIOLOGY CONSULT NOTE  Patient ID: Krystal Hensley MRN: [id number] DOB/AGE: Nov 19, 1917 80 y.o.  Admit date: 01/14/2014 Primary Physician [name], MD  Reason for Consultation: chest pain  HPI: The patient is a 80 year old woman with a past medical history significant for hypertension and paroxysmal atrial fibrillation who has been experiencing intermittent chest pressure since 2:00 this morning. She said she normally wakes up at approximately 2 AM and does not sleep much. She was not awoken by the chest pressure. However, she described it as "something sitting on my chest". She took a chewable baby aspirin and went to sleep. By the time she woke later this morning, the chest pressure had resolved but she felt significantly fatigued. She called her daughter and went to an urgent care. From there, 911 was called. She denied associated shortness of breath, dizziness, and palpitations. She sleeps with 2 pillows for comfort and to help her breathe better, but has done so for the past 10 years. She said she sustained a stroke this past February and takes aspirin only. She does not believe anticoagulants were ever discussed. She said she had a cardiac catheterization approximately 25 years ago that was normal. She said it was at that time that an abnormal heart rhythm was discovered. She denies leg swelling and paroxysmal nocturnal dyspnea. Last fall, she was walking to her front door too quickly, missed a step and fell and fractured some ribs.  She has been given a nitro patch and while the chest pressure has diminished, it has not completely resolved.  Soc: Never smoked. Widowed x 10 years. Has a daughter.  No Known Allergies  No current facility-administered medications for this encounter.    Past Medical History  Diagnosis Date  . Hypertension   . Arthritis   . Atrial fibrillation   . Back pain   . Cancer     colon  . Polymyalgia rheumatica   . Hypoglycemia   .  HTN (hypertension) 08/08/2013  . A-fib 08/08/2013  . Stroke     Past Surgical History  Procedure Laterality Date  . Colon surgery    . Cesarean section    . Bilateral oophorectomy    . Eye surgery      History   Social History  . Marital Status: Widowed    Spouse Name: N/A    Number of Children: N/A  . Years of Education: N/A   Occupational History  . Not on file.   Social History Main Topics  . Smoking status: Never Smoker   . Smokeless tobacco: Not on file  . Alcohol Use: No  . Drug Use: No  . Sexual Activity: No   Other Topics Concern  . Not on file   Social History Narrative  . No narrative on file     No family history of premature CAD in 1st degree relatives.  Prior to Admission medications   Medication Sig Start Date End Date Taking? Authorizing Provider  aspirin 81 MG chewable tablet Chew 1 tablet (81 mg total) by mouth daily. 08/19/13   Lavon Paganini Angiulli, PA-C  calcium-vitamin D (OSCAL WITH D) 500-200 MG-UNIT per tablet Take 1 tablet by mouth daily.    Historical Provider, MD  lidocaine (LIDODERM) 5 %  09/13/13   Historical Provider, MD  metoprolol succinate (TOPROL-XL) 25 MG 24 hr tablet Take 1 tablet (25 mg total) by mouth at bedtime. 08/19/13   Lavon Paganini Angiulli, PA-C  metoprolol succinate (TOPROL-XL) 50 MG  24 hr tablet Take 1 tablet (50 mg total) by mouth daily. Take with or immediately following a meal. 08/19/13   Lavon Paganini Angiulli, PA-C  Multiple Vitamins-Minerals (OCUVITE-LUTEIN PO) Take 1 tablet by mouth daily.    Historical Provider, MD  omeprazole (PRILOSEC OTC) 20 MG tablet Take 20 mg by mouth every other day.    Historical Provider, MD  predniSONE (DELTASONE) 5 MG tablet Take 5 mg by mouth daily.    Historical Provider, MD     Review of systems complete and found to be negative unless listed above in HPI     Physical exam Blood pressure 150/74, pulse 77, temperature 98.3 F (36.8 C), temperature source Oral, resp. rate 28, SpO2  99.00%. General: NAD Neck: No JVD, no thyromegaly or thyroid nodule.  Lungs: Clear to auscultation bilaterally with normal respiratory effort. CV: Nondisplaced PMI. Regular rate and rhythm, normal S1/S2, no S3/S4, no murmur.  No peripheral edema with venous stasis dermatitis.  No carotid bruit.  Normal pedal pulses.  Abdomen: Soft, nontender, no hepatosplenomegaly, no distention.  Skin: Intact without lesions or rashes.  Neurologic: Alert and oriented x 3.  Psych: Normal affect. Extremities: No clubbing or cyanosis.  HEENT: Normal.   Labs:   Lab Results  Component Value Date   WBC 11.4* 01/14/2014   HGB 13.8 01/14/2014   HCT 41.3 01/14/2014   MCV 98.3 01/14/2014   PLT 226 01/14/2014    Recent Labs Lab 01/14/14 1039  NA 138  K 4.4  CL 100  CO2 26  BUN 11  CREATININE 0.54  CALCIUM 9.0  GLUCOSE 114*   Lab Results  Component Value Date   TROPONINI <0.30 01/14/2014    Lab Results  Component Value Date   CHOL 216* 08/09/2013   Lab Results  Component Value Date   HDL 73 08/09/2013   Lab Results  Component Value Date   LDLCALC 120* 08/09/2013   Lab Results  Component Value Date   TRIG 116 08/09/2013   Lab Results  Component Value Date   CHOLHDL 3.0 08/09/2013   No results found for this basename: LDLDIRECT         Studies: Dg Chest Portable 1 View  01/14/2014   CLINICAL DATA:  Chest pressure.  EXAM: PORTABLE CHEST - 1 VIEW  COMPARISON:  08/08/2013.  FINDINGS: Mediastinum and hilar structures normal. Cardiomegaly. Normal pulmonary vascularity. Pleural parenchymal scarring left lung base. No pneumothorax.Left anterior sixth and seventh rib fractures noted. These are new from prior exams.  IMPRESSION: 1. Stable cardiomegaly. 2. Pleural parenchymal scarring left lung base again noted. 3. Left anterior sixth and seventh rib fractures are noted. These are new from prior exams. No pneumothorax.   Electronically Signed   By: Marcello Moores  Register   On: 01/14/2014 11:18    ASSESSMENT  AND PLAN:  1. Chest pain: Symptoms are concerning for a coronary etiology. ECG demonstrates sinus rhythm with nonspecific ST changes with possible anteroseptal infarct, unchanged from ECG in 08/2013. First troponin normal. Agree with ASA, beta blocker, and nitro. Continue to cycle troponins. If she rules in for an acute coronary syndrome and it is deemed coronary angiography is warranted, she would be willing to consider it. BNP 1065 in setting of chest pressure and initial BP of 172/102. BNP elevation can be due to elevated end-diastolic filling pressures with severe HTN, as well as ischemia. No evidence of heart failure. Echo in 08/2013 demonstrated vigorous LV systolic function, EF 93-81%, with mild LVH, grade I diastolic  dysfunction, and mild mitral regurgitation. She also has presumably new rib fractures not seen on prior chest films, which is worrisome for pathologic fractures. 2. Atrial fibrillation: Appears to be paroxysmal. She is only on ASA. CHADS-VASC score is 6/9, thus anticoagulation with perhaps a target-specific agent is favored. Risk benefit ratio must be weighed given age and h/o falls. Continue beta blocker and ASA for now. 3. HTN: Markedly elevated on admission, now more reasonably controlled with nitro. Consider adding ACEI if it remains difficult to control.    Signed: Kate Sable, M.D., F.A.C.C.  01/14/2014, 2:37 PM

## 2014-01-14 NOTE — ED Notes (Signed)
Admitting MD at bedside.

## 2014-01-14 NOTE — Consult Note (Signed)
Please call pharmacy at [phone] when patient's daughter arrives with medication list.

## 2014-01-14 NOTE — ED Notes (Signed)
Lattie Haw, PA at the bedside.

## 2014-01-14 NOTE — ED Provider Notes (Signed)
CSN: [id number]     Arrival date & time 01/14/14  1023 History   First MD Initiated Contact with Patient 01/14/14 1024     Chief Complaint  Patient presents with  . Chest Pain     (Consider location/radiation/quality/duration/timing/severity/associated sxs/prior Treatment) Patient is a 80 y.o. female presenting with chest pain. The history is provided by the patient and medical records.  Chest Pain  This is a 80 year old female with past medical history significant for hypertension, atrial fibrillation, prior CVA without residual deficit, presenting to the ED for chest pressure. Patient states she woke up around 3 AM and felt a "heaviness" in her chest. She denies associated shortness of breath, palpitations, dizziness, weakness, nausea, vomiting.  She has no pain at neck or extremities.  Patient was seen at [facility] for this, found to have abnormal EKG and sent to the ED for further evaluation.  Pt was given ASA and 1 SL NTG en route with some decrease in her pain but she can still feels the heaviness in her chest.  Patient has no hx of CAD or MI.  VS stable on arrival.  Past Medical History  Diagnosis Date  . Hypertension   . Arthritis   . Atrial fibrillation   . Back pain   . Cancer     colon  . Polymyalgia rheumatica   . Hypoglycemia   . HTN (hypertension) 08/08/2013  . A-fib 08/08/2013  . Stroke    Past Surgical History  Procedure Laterality Date  . Colon surgery    . Cesarean section    . Bilateral oophorectomy    . Eye surgery     History reviewed. No pertinent family history. History  Substance Use Topics  . Smoking status: Never Smoker   . Smokeless tobacco: Not on file  . Alcohol Use: No   OB History   Grav Para Term Preterm Abortions TAB SAB Ect Mult Living                 Review of Systems  Cardiovascular: Positive for chest pain.  All other systems reviewed and are negative.     Allergies  Review of patient's allergies indicates no known  allergies.  Home Medications   Prior to Admission medications   Medication Sig Start Date End Date Taking? Authorizing Provider  aspirin 81 MG chewable tablet Chew 1 tablet (81 mg total) by mouth daily. 08/19/13   Lavon Paganini Angiulli, PA-C  calcium-vitamin D (OSCAL WITH D) 500-200 MG-UNIT per tablet Take 1 tablet by mouth daily.    Historical Provider, MD  lidocaine (LIDODERM) 5 %  09/13/13   Historical Provider, MD  metoprolol succinate (TOPROL-XL) 25 MG 24 hr tablet Take 1 tablet (25 mg total) by mouth at bedtime. 08/19/13   Lavon Paganini Angiulli, PA-C  metoprolol succinate (TOPROL-XL) 50 MG 24 hr tablet Take 1 tablet (50 mg total) by mouth daily. Take with or immediately following a meal. 08/19/13   Lavon Paganini Angiulli, PA-C  Multiple Vitamins-Minerals (OCUVITE-LUTEIN PO) Take 1 tablet by mouth daily.    Historical Provider, MD  omeprazole (PRILOSEC OTC) 20 MG tablet Take 20 mg by mouth every other day.    Historical Provider, MD  predniSONE (DELTASONE) 5 MG tablet Take 5 mg by mouth daily.    Historical Provider, MD   BP 140/76  Pulse 82  Temp(Src) 98.3 F (36.8 C) (Oral)  Resp 16  SpO2 98%  Physical Exam  Nursing note and vitals reviewed. Constitutional: She is  oriented to person, place, and time. She appears well-developed and well-nourished. No distress.  Elderly  HENT:  Head: Normocephalic and atraumatic.  Mouth/Throat: Oropharynx is clear and moist.  Eyes: Conjunctivae and EOM are normal. Pupils are equal, round, and reactive to light.  Neck: Normal range of motion. Neck supple.  Cardiovascular: Normal rate, regular rhythm and normal heart sounds.   Pulmonary/Chest: Effort normal and breath sounds normal. No respiratory distress. She has no wheezes.  Abdominal: Soft. Bowel sounds are normal. There is no tenderness. There is no guarding.  Musculoskeletal: Normal range of motion. She exhibits no edema.  Neurological: She is alert and oriented to person, place, and time.  Skin: Skin is  warm and dry. She is not diaphoretic.  Psychiatric: She has a normal mood and affect.    ED Course  Procedures (including critical care time) Labs Review Labs Reviewed  CBC WITH DIFFERENTIAL - Abnormal; Notable for the following:    WBC 11.4 (*)    Neutrophils Relative % 85 (*)    Neutro Abs 9.8 (*)    Lymphocytes Relative 6 (*)    All other components within normal limits  BASIC METABOLIC PANEL - Abnormal; Notable for the following:    Glucose, Bld 114 (*)    GFR calc non Af Amer 79 (*)    All other components within normal limits  PRO B NATRIURETIC PEPTIDE - Abnormal; Notable for the following:    Pro B Natriuretic peptide (BNP) 1065.0 (*)    All other components within normal limits  TROPONIN I    Imaging Review No results found.   EKG Interpretation   Date/Time:  Saturday January 14 2014 10:32:07 EDT Ventricular Rate:  88 PR Interval:  168 QRS Duration: 106 QT Interval:  392 QTC Calculation: 474 R Axis:   -173 Text Interpretation:  Sinus rhythm Ventricular premature complex Aberrant  conduction of SV complex(es) Probable anteroseptal infarct, recent Lateral  leads are also involved similar to 08/08/13 Confirmed by HORTON  MD,  COURTNEY ([id number]) on 01/14/2014 10:42:55 AM      MDM   Final diagnoses:  Chest pain, unspecified chest pain type   EKG with isolated ST elevation in V2 and lateral depression which are new but not STEMI criteria.  Troponin negative.  Labs reassuring.  CXR clear-- incidental notice of left sixth and seventh rib fx which are new, however pt denies recent trauma, falls.  After application of nitro paste pt is currently pain free.  Given negative work up but new EKG changes, will plan admission for observation.  Discussed with hospitalist service, Dr. Olevia Bowens, who will admit for observation.  Temp admission orders placed, VS remain stable.  Larene Pickett, PA-C 01/14/14 1455

## 2014-01-14 NOTE — H&P (Signed)
[facility] History and Physical  Krystal Hensley NFA:[id number] DOB: 07/10/1917 DOA: 01/14/2014  Referring physician: Dr. Dina Hensley PCP: Krystal Argyle, MD   Chief Complaint: Chest pressure  HPI: Krystal Hensley is a 80 y.o. female  80 year old with past medical history of paroxysmal atrial fibrillation on aspirin, CVA without residual effects that comes in for chest pressure that started on the day of admission. She relates is worse with exertion and better with rest. Nitroglycerin makes it better. She was also shortness of breath and weakness associated with this chest pressure. She relates that every time she tried to walk along with the chest pressure she would get short of breath. She relates no radiation. No relationship with foods her position.  In the ED: Cardiac markers are negative x1 EKG shows sinus rhythm with nonspecific T wave changes unchanged compared to previous EKG he had a mild leukocytosis.   Review of Systems:  Constitutional:  No weight loss, night sweats, Fevers, chills, fatigue.  HEENT:  No headaches, Difficulty swallowing,Tooth/dental problems,Sore throat,  No sneezing, itching, ear ache, nasal congestion, post nasal drip,  GI:  No heartburn, indigestion, abdominal pain, nausea, vomiting, diarrhea, change in bowel habits, loss of appetite  Resp:  No excess mucus, no productive cough, No non-productive cough, No coughing up of blood.No change in color of mucus.No wheezing.No chest wall deformity  Skin:  no rash or lesions.  GU:  no dysuria, change in color of urine, no urgency or frequency. No flank pain.  Musculoskeletal:  No joint pain or swelling. No decreased range of motion. No back pain.  Psych:  No change in mood or affect. No depression or anxiety. No memory loss.   Past Medical History  Diagnosis Date  . Hypertension   . Arthritis   . Atrial fibrillation   . Back pain   . Cancer     colon  . Polymyalgia rheumatica   .  Hypoglycemia   . HTN (hypertension) 08/08/2013  . A-fib 08/08/2013  . Stroke    Past Surgical History  Procedure Laterality Date  . Colon surgery    . Cesarean section    . Bilateral oophorectomy    . Eye surgery     Social History:  reports that she has never smoked. She does not have any smokeless tobacco history on file. She reports that she does not drink alcohol or use illicit drugs.  No Known Allergies  Family History  Problem Relation Age of Onset  . Aneurysm Mother   . Heart attack Father      Prior to Admission medications   Medication Sig Start Date End Date Taking? Authorizing Provider  aspirin 81 MG chewable tablet Chew 1 tablet (81 mg total) by mouth daily. 08/19/13   Lavon Paganini Angiulli, PA-C  calcium-vitamin D (OSCAL WITH D) 500-200 MG-UNIT per tablet Take 1 tablet by mouth daily.    Historical Provider, MD  lidocaine (LIDODERM) 5 %  09/13/13   Historical Provider, MD  metoprolol succinate (TOPROL-XL) 25 MG 24 hr tablet Take 1 tablet (25 mg total) by mouth at bedtime. 08/19/13   Lavon Paganini Angiulli, PA-C  metoprolol succinate (TOPROL-XL) 50 MG 24 hr tablet Take 1 tablet (50 mg total) by mouth daily. Take with or immediately following a meal. 08/19/13   Lavon Paganini Angiulli, PA-C  Multiple Vitamins-Minerals (OCUVITE-LUTEIN PO) Take 1 tablet by mouth daily.    Historical Provider, MD  omeprazole (PRILOSEC OTC) 20 MG tablet Take 20 mg by mouth every other  day.    Historical Provider, MD  predniSONE (DELTASONE) 5 MG tablet Take 5 mg by mouth daily.    Historical Provider, MD   Physical Exam: Filed Vitals:   01/14/14 1245  BP: 140/66  Pulse: 77  Temp:   Resp: 11    BP 140/66  Pulse 77  Temp(Src) 98.3 F (36.8 C) (Oral)  Resp 11  SpO2 96%  General:  Appears calm and comfortable Eyes: PERRL, normal lids, irises & conjunctiva ENT: grossly normal hearing, lips & tongue Neck: no LAD, masses or thyromegaly Cardiovascular: RRR, no m/r/g. No LE edema. Telemetry: SR, no  arrhythmias  Respiratory: Good air movement clear to auscultation Abdomen: soft, ntnd Skin: no rash or induration seen on limited exam Musculoskeletal: grossly normal tone BUE/BLE Psychiatric: grossly normal mood and affect, speech fluent and appropriate Neurologic: grossly non-focal.          Labs on Admission:  Basic Metabolic Panel:  Recent Labs Lab 01/14/14 1039  NA 138  K 4.4  CL 100  CO2 26  GLUCOSE 114*  BUN 11  CREATININE 0.54  CALCIUM 9.0   Liver Function Tests: No results found for this basename: AST, ALT, ALKPHOS, BILITOT, PROT, ALBUMIN,  in the last 168 hours No results found for this basename: LIPASE, AMYLASE,  in the last 168 hours No results found for this basename: AMMONIA,  in the last 168 hours CBC:  Recent Labs Lab 01/14/14 1039  WBC 11.4*  NEUTROABS 9.8*  HGB 13.8  HCT 41.3  MCV 98.3  PLT 226   Cardiac Enzymes:  Recent Labs Lab 01/14/14 1039  TROPONINI <0.30    BNP (last 3 results)  Recent Labs  01/14/14 1039  PROBNP 1065.0*   CBG: No results found for this basename: GLUCAP,  in the last 168 hours  Radiological Exams on Admission: Dg Chest Portable 1 View  01/14/2014   CLINICAL DATA:  Chest pressure.  EXAM: PORTABLE CHEST - 1 VIEW  COMPARISON:  08/08/2013.  FINDINGS: Mediastinum and hilar structures normal. Cardiomegaly. Normal pulmonary vascularity. Pleural parenchymal scarring left lung base. No pneumothorax.Left anterior sixth and seventh rib fractures noted. These are new from prior exams.  IMPRESSION: 1. Stable cardiomegaly. 2. Pleural parenchymal scarring left lung base again noted. 3. Left anterior sixth and seventh rib fractures are noted. These are new from prior exams. No pneumothorax.   Electronically Signed   By: Marcello Moores  Register   On: 01/14/2014 11:18    EKG: Independently reviewed. Sinus rhythm with nonspecific T wave changes and precordial leads Assessment/Plan  Chest pain: - Her heart score is 5, I will go ahead  and admit her to telemetry, cycle her cardiac enzymes x3 check an EKG today TSH and B12. Start her on aspirin 325, as well as nitroglycerin when necessary. Also start her on statins. - Continue her current dose of beta blocker. - She does have a 6 and 7 rib fracture on chest x-ray.  HTN (hypertension): - Mildly high when she arrived to the emergency room has continued to improve now 140/66 and nitroglycerin.  Paroxismal  A-fib - Currently on no anticoagulation, will continue aspirin 325. - Currently sinus rhythm rate control.  Leukocytosis: - Mild white count, she denies any fever, cough, nausea, vomiting. No rashes. - If develop a fever we'll get blood cultures.   Code Status: full Family Communication: daughter Disposition Plan: inpatient  Time spent: 23 minutes  Charlynne Cousins [facility] Pager [phone]  **Disclaimer: This note may have been  dictated with voice recognition software. Similar sounding words can inadvertently be transcribed and this note may contain transcription errors which may not have been corrected upon publication of note.**

## 2014-01-14 NOTE — Patient Instructions (Signed)
Return as necessary

## 2014-01-14 NOTE — ED Notes (Signed)
Pt finished eating and assisted to the [facility] prior to going upstairs.

## 2014-01-14 NOTE — Progress Notes (Signed)
Subjective: Patient was fine yesterday. The night she awakened with chest heaviness. She felt high had acute blankets off but then was chilled. The chest heaviness has continued to persist. Lives alone. This morning she called her daughter who works at a later to the emergency room. She did not want to go there. She was brought in here and received prompt triage being and treatment.  She is still having chest heaviness. No diaphoresis. No shortness of breath complaints.  She has a history of having a stroke earlier this year affecting her right hand. She is on steroids 4 history of temporal arteritis.  She took 2 baby aspirin this morning.  Objective: Elderly alert accompanied by her daughter.: Good. Her throat is clear. Neck supple. No JVD. Chest clear. Heart regular except for an occasional ectopic beat. Abdomen soft. Extremities without edema. EKG has ST elevation in V2. However has an intraventricular conduction delay and the change is just 1-2 mm different from the old EKG but definitely looks abnormal and different. The old EKGs are poor quality difficult to compare with.  EMS was called. Patient was given oxygen, and IV started, and given sublingual much closer. She also received 3 more baby aspirin. She is being sent by EMS to the emergency room at [facility]  Assessment: Chest pressure Acute coronary syndrome, suspicious for a high anterior myocardial infarction Intraventricular conduction delay History of temporal arteritis History of recent CVA Hypertension  Plan: Send probably to the hospital. Spoke to the emergency room physician.

## 2014-01-15 MED ORDER — ROSUVASTATIN CALCIUM 5 MG PO TABS: 5.0000 mg | ORAL_TABLET | Freq: Every day | ORAL | Status: DC

## 2014-01-15 NOTE — Discharge Summary (Signed)
Physician Discharge Summary  Krystal Hensley OVF:[id number] DOB: 1918-04-22 DOA: 01/14/2014  PCP: [name], MD  Admit date: 01/14/2014 Discharge date: 01/15/2014  Time spent: 35 minutes  Recommendations for Outpatient Follow-up:  1. Follow up with cardiology for stress test as an outpatient 2. Follow with Dr. Felipa Eth in 2 week, will repeat ct chest to evaluate rib fractures.  Discharge Diagnoses:  Principal Problem:   Chest pain Active Problems:   HTN (hypertension)   A-fib   Discharge Condition: stable  Diet recommendation: heart healthy  Filed Weights   01/14/14 1508 01/15/14 0526  Weight: 51.256 kg (113 lb) 50.44 kg (111 lb 3.2 oz)    History of present illness:  80 y.o. female  80 year old with past medical history of paroxysmal atrial fibrillation on aspirin, CVA without residual effects that comes in for chest pressure that started on the day of admission. She relates is worse with exertion and better with rest. Nitroglycerin makes it better. She was also shortness of breath and weakness associated with this chest pressure. She relates that every time she tried to walk along with the chest pressure she would get short of breath. She relates no radiation. No relationship with foods her position.   Hospital Course:  Chest pain:  - Her heart score is 5, I will go ahead and admit her to telemetry, cycle her cardiac enzymes x3 negative  - Also start her on statins low dose. - Continue her current dose of beta blocker.  - consulted cardiology will get a stress test as an outpatient. - She does have a 6 and 7 rib fracture on chest x-ray no pain full or trauma. Will need evaluation as an outpatient by PCP. - Probably a ct chest. She does have osteoporosis.   HTN (hypertension):  - now improved.  Paroxismal A-fib  - Currently on no anticoagulation, will continue aspirin 325.  - Currently sinus rhythm rate control.  - will have to d/w PCP weather she will  benefit from oral anticoagulation CHADS-VASC score is 6  Leukocytosis:  - Mild white count, she denies any fever, cough, nausea, vomiting. No rashes.   Procedures:  CXR  Consultations:  cardiology  Discharge Exam: Filed Vitals:   01/15/14 0924  BP: 129/61  Pulse: 82  Temp:   Resp:     General: A&O x3 Cardiovascular: RRR Respiratory: good air movement CTA B/L  Discharge Instructions You were cared for by a hospitalist during your hospital stay. If you have any questions about your discharge medications or the care you received while you were in the hospital after you are discharged, you can call the unit and asked to speak with the hospitalist on call if the hospitalist that took care of you is not available. Once you are discharged, your primary care physician will handle any further medical issues. Please note that NO REFILLS for any discharge medications will be authorized once you are discharged, as it is imperative that you return to your primary care physician (or establish a relationship with a primary care physician if you do not have one) for your aftercare needs so that they can reassess your need for medications and monitor your lab values.      Discharge Instructions   Diet - low sodium heart healthy    Complete by:  As directed      Increase activity slowly    Complete by:  As directed             Medication List  aspirin 81 MG chewable tablet  Chew 1 tablet (81 mg total) by mouth daily.     calcium-vitamin D 500-200 MG-UNIT per tablet  Commonly known as:  OSCAL WITH D  Take 1 tablet by mouth daily.     lidocaine 5 %  Commonly known as:  LIDODERM  Place 1 patch onto the skin daily.     metoprolol succinate 50 MG 24 hr tablet  Commonly known as:  TOPROL-XL  Take 25-50 mg by mouth 2 (two) times daily. Take 1 tablet in the AM and 0.5 tablet in PM     OCUVITE-LUTEIN PO  Take 1 tablet by mouth daily.     omeprazole 20 MG tablet  Commonly  known as:  PRILOSEC OTC  Take 20 mg by mouth every other day.     predniSONE 5 MG tablet  Commonly known as:  DELTASONE  Take 5 mg by mouth daily.     rosuvastatin 5 MG tablet  Commonly known as:  CRESTOR  Take 1 tablet (5 mg total) by mouth daily.       Allergies  Allergen Reactions  . Statins Other (See Comments)    myalgia   Follow-up Information   Follow up with [facility] In 1 week. (hospital follow up)    Contact information:   [place] [id number] [phone]      Follow up with [name], MD In 3 weeks. (hospital follow up)    Specialty:  Internal Medicine   Contact information:   [address] [place] [address] [phone]        The results of significant diagnostics from this hospitalization (including imaging, microbiology, ancillary and laboratory) are listed below for reference.    Significant Diagnostic Studies: Dg Chest Portable 1 View  01/14/2014   CLINICAL DATA:  Chest pressure.  EXAM: PORTABLE CHEST - 1 VIEW  COMPARISON:  08/08/2013.  FINDINGS: Mediastinum and hilar structures normal. Cardiomegaly. Normal pulmonary vascularity. Pleural parenchymal scarring left lung base. No pneumothorax.Left anterior sixth and seventh rib fractures noted. These are new from prior exams.  IMPRESSION: 1. Stable cardiomegaly. 2. Pleural parenchymal scarring left lung base again noted. 3. Left anterior sixth and seventh rib fractures are noted. These are new from prior exams. No pneumothorax.   Electronically Signed   By: Marcello Moores  Register   On: 01/14/2014 11:18    Microbiology: No results found for this or any previous visit (from the past 240 hour(s)).   Labs: Basic Metabolic Panel:  Recent Labs Lab 01/14/14 1039  NA 138  K 4.4  CL 100  CO2 26  GLUCOSE 114*  BUN 11  CREATININE 0.54  CALCIUM 9.0   Liver Function Tests: No results found for this  basename: AST, ALT, ALKPHOS, BILITOT, PROT, ALBUMIN,  in the last 168 hours No results found for this basename: LIPASE, AMYLASE,  in the last 168 hours No results found for this basename: AMMONIA,  in the last 168 hours CBC:  Recent Labs Lab 01/14/14 1039  WBC 11.4*  NEUTROABS 9.8*  HGB 13.8  HCT 41.3  MCV 98.3  PLT 226   Cardiac Enzymes:  Recent Labs Lab 01/14/14 1039 01/14/14 1836 01/14/14 2242  TROPONINI <0.30 <0.30 <0.30   BNP: BNP (last 3 results)  Recent Labs  01/14/14 1039  PROBNP 1065.0*   CBG:  Recent Labs Lab 01/14/14 1635  GLUCAP 115*       Signed:  Charlynne Cousins  [facility] 01/15/2014, 11:04 AM

## 2014-01-15 NOTE — Progress Notes (Signed)
Utilization Review Completed.Felicia Bloomquist T7/06/2014  

## 2014-01-15 NOTE — Progress Notes (Signed)
DC IV, DC tele, DC Home. Discharge instructions and home medications dicussed with patient and patient's daughter. Patient and patient's daughter denied any questions or concerns at this time. Patient leaving unit via wheelchair and appears in no acute distress.

## 2014-01-15 NOTE — Progress Notes (Signed)
SUBJECTIVE: Pt feels "much better this morning", denying chest pain/pressure. Says BP gets high at times.     Intake/Output Summary (Last 24 hours) at 01/15/14 1021 Last data filed at 01/15/14 0700  Gross per 24 hour  Intake    170 ml  Output   1275 ml  Net  -1105 ml    Current Facility-Administered Medications  Medication Dose Route Frequency Provider Last Rate Last Dose  . acetaminophen (TYLENOL) tablet 650 mg  650 mg Oral Q6H PRN Charlynne Cousins, MD   650 mg at 01/14/14 2246  . atorvastatin (LIPITOR) tablet 40 mg  40 mg Oral q1800 Charlynne Cousins, MD      . haloperidol lactate (HALDOL) injection 1 mg  1 mg Intravenous Q6H PRN Charlynne Cousins, MD      . heparin injection 5,000 Units  5,000 Units Subcutaneous 3 times per day Charlynne Cousins, MD   5,000 Units at 01/15/14 [phone]  . metoprolol succinate (TOPROL-XL) 24 hr tablet 25 mg  25 mg Oral QHS Charlynne Cousins, MD   25 mg at 01/14/14 2148  . metoprolol succinate (TOPROL-XL) 24 hr tablet 50 mg  50 mg Oral Q breakfast Charlynne Cousins, MD   50 mg at 01/15/14 0708  . ondansetron (ZOFRAN) injection 4 mg  4 mg Intravenous Q6H PRN Charlynne Cousins, MD      . pantoprazole (PROTONIX) EC tablet 40 mg  40 mg Oral [place], [place]   40 mg at 01/15/14 0998  . predniSONE (DELTASONE) tablet 5 mg  5 mg Oral Daily Charlynne Cousins, MD   5 mg at 01/15/14 0924    Filed Vitals:   01/14/14 2145 01/15/14 0033 01/15/14 0526 01/15/14 0924  BP: 160/60 148/70 146/60 129/61  Pulse:  77 99 82  Temp:  98 F (36.7 C) 97.6 F (36.4 C)   TempSrc:  Oral Oral   Resp:  18 18   Height:      Weight:   111 lb 3.2 oz (50.44 kg)   SpO2:  95% 96%     PHYSICAL EXAM General: NAD  Neck: No JVD, no thyromegaly or thyroid nodule.  Lungs: Clear to auscultation bilaterally with normal respiratory effort.  CV: Nondisplaced PMI. Regular rate and rhythm, normal S1/S2, no S3/S4, no murmur. No peripheral edema  with venous stasis dermatitis. No carotid bruit. Normal pedal pulses.  Abdomen: Soft, nontender, no hepatosplenomegaly, no distention.  Skin: Intact without lesions or rashes.  Neurologic: Alert and oriented x 3.  Psych: Normal affect.  Extremities: No clubbing or cyanosis.  HEENT: Normal.   TELEMETRY: Reviewed telemetry pt in sinus rhythm.  LABS: Basic Metabolic Panel:  Recent Labs  01/14/14 1039  NA 138  K 4.4  CL 100  CO2 26  GLUCOSE 114*  BUN 11  CREATININE 0.54  CALCIUM 9.0   Liver Function Tests: No results found for this basename: AST, ALT, ALKPHOS, BILITOT, PROT, ALBUMIN,  in the last 72 hours No results found for this basename: LIPASE, AMYLASE,  in the last 72 hours CBC:  Recent Labs  01/14/14 1039  WBC 11.4*  NEUTROABS 9.8*  HGB 13.8  HCT 41.3  MCV 98.3  PLT 226   Cardiac Enzymes:  Recent Labs  01/14/14 1039 01/14/14 1836 01/14/14 2242  TROPONINI <0.30 <0.30 <0.30   BNP: No components found with this basename: POCBNP,  D-Dimer: No results found for this basename: DDIMER,  in the  last 72 hours Hemoglobin A1C: No results found for this basename: HGBA1C,  in the last 72 hours Fasting Lipid Panel: No results found for this basename: CHOL, HDL, LDLCALC, TRIG, CHOLHDL, LDLDIRECT,  in the last 72 hours Thyroid Function Tests: No results found for this basename: TSH, T4TOTAL, FREET3, T3FREE, THYROIDAB,  in the last 72 hours Anemia Panel: No results found for this basename: VITAMINB12, FOLATE, FERRITIN, TIBC, IRON, RETICCTPCT,  in the last 72 hours  RADIOLOGY: Dg Chest Portable 1 View  01/14/2014   CLINICAL DATA:  Chest pressure.  EXAM: PORTABLE CHEST - 1 VIEW  COMPARISON:  08/08/2013.  FINDINGS: Mediastinum and hilar structures normal. Cardiomegaly. Normal pulmonary vascularity. Pleural parenchymal scarring left lung base. No pneumothorax.Left anterior sixth and seventh rib fractures noted. These are new from prior exams.  IMPRESSION: 1. Stable  cardiomegaly. 2. Pleural parenchymal scarring left lung base again noted. 3. Left anterior sixth and seventh rib fractures are noted. These are new from prior exams. No pneumothorax.   Electronically Signed   By: Marcello Moores  Register   On: 01/14/2014 11:18      ASSESSMENT AND PLAN: 1. Chest pain: No further chest pain. Troponins normal, and has ruled out for an acute coronary syndrome. Initial symptoms were concerning for a coronary etiology. ECG demonstrated sinus rhythm with nonspecific ST changes with possible anteroseptal infarct, unchanged from ECG in 08/2013. Agree with continuation of ASA, beta blocker, and prn nitro at discharge. BNP 1065 on 7/11 in setting of chest pressure and initial BP of 172/102. BNP elevation can be due to elevated end-diastolic filling pressures with severe HTN, as well as ischemia. No evidence of heart failure. Echo in 08/2013 demonstrated vigorous LV systolic function, EF 50-03%, with mild LVH, grade I diastolic dysfunction, and mild mitral regurgitation.  Would recommend consideration for outpatient stress testing. She also has presumably new rib fractures not seen on prior chest films, which is worrisome for pathologic fractures, as she denies h/o recent falls.   2. Atrial fibrillation: Appears to be paroxysmal. She is only on ASA. CHADS-VASC score is 6/9, thus anticoagulation with perhaps a target-specific agent is favored. Risk benefit ratio must be weighed given age and h/o falls. Continue beta blocker and ASA for now.   3. HTN: Markedly elevated on admission, now controlled. Consider adding ACEI if she were to have recurrences.  Dispo: Stable from a cardiovascular standpoint. Consider outpatient stress test. No further recommendations.  Kate Sable, M.D., F.A.C.C.

## 2014-02-02 ENCOUNTER — Encounter: Payer: Self-pay | Admitting: Cardiology

## 2014-02-02 ENCOUNTER — Ambulatory Visit (INDEPENDENT_AMBULATORY_CARE_PROVIDER_SITE_OTHER): Payer: Medicare Other | Admitting: Cardiology

## 2014-02-02 VITALS — BP 122/76 | HR 85 | Ht 61.0 in | Wt 116.0 lb

## 2014-02-02 DIAGNOSIS — I6350 Cerebral infarction due to unspecified occlusion or stenosis of unspecified cerebral artery: Secondary | ICD-10-CM

## 2014-02-02 DIAGNOSIS — I2789 Other specified pulmonary heart diseases: Secondary | ICD-10-CM

## 2014-02-02 DIAGNOSIS — I272 Pulmonary hypertension, unspecified: Secondary | ICD-10-CM

## 2014-02-02 MED ORDER — METOPROLOL SUCCINATE ER 25 MG PO TB24: 25.0000 mg | ORAL_TABLET | Freq: Every morning | ORAL | Status: DC

## 2014-02-02 MED ORDER — ISOSORBIDE MONONITRATE ER 30 MG PO TB24: 30.0000 mg | ORAL_TABLET | Freq: Every day | ORAL | Status: DC

## 2014-02-02 NOTE — Patient Instructions (Addendum)
Your physician has recommended you make the following change in your medication:  1. Decrease metoprolol to 25 mg 1 tab daily 2. Start Imdur 30 mg 1 tab around dinner time   Your physician recommends that you schedule a follow-up appointment in: 2 months

## 2014-02-02 NOTE — Progress Notes (Signed)
Patient ID: Krystal Hensley, female   DOB: [birth year], 80 y.o.   MRN: [id number]     Patient Name: Krystal Hensley Date of Encounter: 02/02/2014  Primary Care Provider:  [name], MD Primary Cardiologist:  Dorothy Spark  Problem List   Past Medical History  Diagnosis Date  . Hypertension   . Arthritis   . Atrial fibrillation   . Back pain   . Cancer     colon  . Polymyalgia rheumatica   . Hypoglycemia   . HTN (hypertension) 08/08/2013  . A-fib 08/08/2013  . Stroke    Past Surgical History  Procedure Laterality Date  . Colon surgery    . Cesarean section    . Bilateral oophorectomy    . Eye surgery      Allergies  Allergies  Allergen Reactions  . Statins Other (See Comments)    myalgia    HPI  The patient is a 80 year old woman with a past medical history significant for hypertension and paroxysmal atrial fibrillation who has been experiencing intermittent chest pressure since 2:00 this morning. She said she normally wakes up at approximately 2 AM and does not sleep much. She was not awoken by the chest pressure. However, she described it as "something sitting on my chest". She took a chewable baby aspirin and went to sleep. By the time she woke later this morning, the chest pressure had resolved but she felt significantly fatigued. She called her daughter and went to an urgent care. From there, 911 was called. She denied associated shortness of breath, dizziness, and palpitations. She sleeps with 2 pillows for comfort and to help her breathe better, but has done so for the past 10 years. She said she sustained a stroke this past February and takes aspirin only. She does not believe anticoagulants were ever discussed. She said she had a cardiac catheterization approximately 25 years ago that was normal. She said it was at that time that an abnormal heart rhythm was discovered. She denies leg swelling and paroxysmal nocturnal dyspnea.  Last fall, she was  walking to her front door too quickly, missed a step and fell and fractured some ribs.  She has been given a nitro patch and while the chest pressure has diminished, it has not completely resolved.  1. Chest pain: No further chest pain. Troponins normal, and has ruled out for an acute coronary syndrome. Initial symptoms were concerning for a coronary etiology. ECG demonstrated sinus rhythm with nonspecific ST changes with possible anteroseptal infarct, unchanged from ECG in 08/2013. Agree with continuation of ASA, beta blocker, and prn nitro at discharge. BNP 1065 on 7/11 in setting of chest pressure and initial BP of 172/102. BNP elevation can be due to elevated end-diastolic filling pressures with severe HTN, as well as ischemia. No evidence of heart failure. Echo in 08/2013 demonstrated vigorous LV systolic function, EF 65-78%, with mild LVH, grade I diastolic dysfunction, and mild mitral regurgitation.  Would recommend consideration for outpatient stress testing. She also has presumably new rib fractures not seen on prior chest films, which is worrisome for pathologic fractures, as she denies h/o recent falls.   02/02/2014 - the patient states that since the discharge she hasn't experienced any chest pressure. However she has noticed in the last year that she is more fatigued and weak, specifically her legs on are very weak while she's walking she has been feeling mildly short of breath but states that when she got nitroglycerin patch in the  hospital she felt like her whole new person. She denies any palpitations or syncope.   Home Medications  Prior to Admission medications   Medication Sig Start Date End Date Taking? Authorizing Provider  aspirin 81 MG chewable tablet Chew 1 tablet (81 mg total) by mouth daily. 08/19/13  Yes Daniel J Angiulli, PA-C  calcium-vitamin D (OSCAL WITH D) 500-200 MG-UNIT per tablet Take 1 tablet by mouth daily.   Yes Historical Provider, MD  lidocaine (LIDODERM) 5 %  Place 1 patch onto the skin daily.  09/13/13  Yes Historical Provider, MD  metoprolol succinate (TOPROL-XL) 50 MG 24 hr tablet Take 25-50 mg by mouth 2 (two) times daily. Take 1 tablet in the AM and 0.5 tablet in PM   Yes Historical Provider, MD  Multiple Vitamins-Minerals (OCUVITE-LUTEIN PO) Take 1 tablet by mouth daily.   Yes Historical Provider, MD  omeprazole (PRILOSEC OTC) 20 MG tablet Take 20 mg by mouth every other day.   Yes Historical Provider, MD  predniSONE (DELTASONE) 5 MG tablet Take 5 mg by mouth daily.   Yes Historical Provider, MD    Family History  Family History  Problem Relation Age of Onset  . Aneurysm Mother   . Heart attack Father     Social History  History   Social History  . Marital Status: Widowed    Spouse Name: N/A    Number of Children: N/A  . Years of Education: N/A   Occupational History  . Not on file.   Social History Main Topics  . Smoking status: Never Smoker   . Smokeless tobacco: Not on file  . Alcohol Use: No  . Drug Use: No  . Sexual Activity: No   Other Topics Concern  . Not on file   Social History Narrative  . No narrative on file     Review of Systems, as per HPI, otherwise negative General:  No chills, fever, night sweats or weight changes.  Cardiovascular:  No chest pain, dyspnea on exertion, edema, orthopnea, palpitations, paroxysmal nocturnal dyspnea. Dermatological: No rash, lesions/masses Respiratory: No cough, dyspnea Urologic: No hematuria, dysuria Abdominal:   No nausea, vomiting, diarrhea, bright red blood per rectum, melena, or hematemesis Neurologic:  No visual changes, wkns, changes in mental status. All other systems reviewed and are otherwise negative except as noted above.  Physical Exam  Blood pressure 122/76, pulse 85, height 5\' 1"  (1.549 m), weight 116 lb (52.617 kg), SpO2 95.00%.  General: Pleasant, NAD Psych: Normal affect. Neuro: Alert and oriented X 3. Moves all extremities spontaneously. HEENT:  Normal  Neck: Supple without bruits or JVD. Lungs:  Resp regular and unlabored, CTA. Heart: RRR no s3, s4, or murmurs. Abdomen: Soft, non-tender, non-distended, BS + x 4.  Extremities: No clubbing, cyanosis or edema. DP/PT/Radials 1+ and equal bilaterally.  Labs:  No results found for this basename: CKTOTAL, CKMB, TROPONINI,  in the last 72 hours Lab Results  Component Value Date   WBC 11.4* 01/14/2014   HGB 13.8 01/14/2014   HCT 41.3 01/14/2014   MCV 98.3 01/14/2014   PLT 226 01/14/2014    No results found for this basename: DDIMER   No components found with this basename: POCBNP,     Component Value Date/Time   NA 138 01/14/2014 1039   K 4.4 01/14/2014 1039   CL 100 01/14/2014 1039   CO2 26 01/14/2014 1039   GLUCOSE 114* 01/14/2014 1039   BUN 11 01/14/2014 1039   CREATININE 0.54 01/14/2014 1039  CALCIUM 9.0 01/14/2014 1039   PROT 6.0 08/11/2013 0720   ALBUMIN 3.0* 08/11/2013 0720   AST 27 08/11/2013 0720   ALT 15 08/11/2013 0720   ALKPHOS 79 08/11/2013 0720   BILITOT 0.7 08/11/2013 0720   GFRNONAA 79* 01/14/2014 1039   GFRAA >90 01/14/2014 1039   Lab Results  Component Value Date   CHOL 216* 08/09/2013   HDL 73 08/09/2013   LDLCALC 120* 08/09/2013   TRIG 116 08/09/2013    Accessory Clinical Findings  Echocardiogram - 08/08/13 Left ventricle: The cavity size was normal. Wall thickness was increased in a pattern of mild LVH. Systolic function was vigorous. The estimated ejection fraction was in the range of 65% to 70%. Doppler parameters are consistent with abnormal left ventricular relaxation (grade 1 diastolic dysfunction). - Mitral valve: Mild regurgitation. - Pulmonary arteries: PA peak pressure: 44mm Hg (S).  ECG - normal sinus rhythm, 78 beats per minute, incomplete left bundle branch block.   Assessment & Plan  1. Chest pain: 1 isolated episodes however the patient is describing overall fatigue that has been worsening. These might be agitated to her age however she had improvement  in her symptoms when she received nitroglycerin patch in the hospital. I agree with continuing aspirin 81 mg daily and metoprolol however I would lower and the dose to 25 mg daily to allow [place] to have some room to add long-acting nitrate to be taken at night. We will add Imdur 30 mg daily.  I had a long discussion with patient and her daughter regarding risk and benefits of proceeding of ischemic [place]. Right now she would rather do medical management. She wants to hold on the stress test unless her symptoms recur.  Patient's daughter will inform us about the progress of her symptoms or any worsening angina or worsening of hypertension.  2. Hypertension - well controlled  3. Moderate pulmonary hypertension - adding long acting nitrate might help with shortness of breath.  4. Atrial fibrillation: Appears to be paroxysmal. She is only on ASA. CHADS-VASC score is 6/9, thus anticoagulation with perhaps a target-specific agent is favored. Risk benefit ratio must be weighed given age and h/o falls. Continue beta blocker and ASA for now.   Followup in 2 months.  Dorothy Spark, MD, [facility] 02/02/2014, 2:14 PM

## 2014-02-15 ENCOUNTER — Telehealth: Payer: Self-pay | Admitting: Cardiology

## 2014-02-15 NOTE — Telephone Encounter (Signed)
Pt calling to inform Dr Meda Coffee and nurse that she is feeling better.  Pt states she is having no dizziness, headache, sob, or any cardiac complaints at this time.  Pt states the only problem she is experiencing is constipation.  Provided pt education by telling her to increase the fiber in her diet, eat oatmeal in the morning or the evenings.  Encouraged the pt to increase her fluid intake.  Encourage her to try drinking warm prune juice or apple juice, or eat prunes.  Advised pt that if those options do not work, then she could consider using metamucil or miralax in the powder form and mixing it with her OJ in the morning.  Pt verbalized understanding and agrees with this plan.  Pt very gracious for all the suggestions provided.  Will route this message to Dr Meda Coffee for her review.

## 2014-02-15 NOTE — Telephone Encounter (Signed)
New message    Patient was told to call back after taken medication for a week.  imdur 30 mg

## 2014-03-31 ENCOUNTER — Encounter: Payer: Self-pay | Admitting: *Deleted

## 2014-04-03 ENCOUNTER — Encounter: Payer: Self-pay | Admitting: Cardiology

## 2014-04-03 ENCOUNTER — Ambulatory Visit (INDEPENDENT_AMBULATORY_CARE_PROVIDER_SITE_OTHER): Payer: Medicare Other | Admitting: Cardiology

## 2014-04-03 VITALS — BP 144/66 | HR 92 | Ht 61.0 in | Wt 116.0 lb

## 2014-04-03 DIAGNOSIS — I480 Paroxysmal atrial fibrillation: Secondary | ICD-10-CM

## 2014-04-03 DIAGNOSIS — I6350 Cerebral infarction due to unspecified occlusion or stenosis of unspecified cerebral artery: Secondary | ICD-10-CM

## 2014-04-03 DIAGNOSIS — I4891 Unspecified atrial fibrillation: Secondary | ICD-10-CM

## 2014-04-03 DIAGNOSIS — I10 Essential (primary) hypertension: Secondary | ICD-10-CM

## 2014-04-03 DIAGNOSIS — K5909 Other constipation: Secondary | ICD-10-CM

## 2014-04-03 DIAGNOSIS — R072 Precordial pain: Secondary | ICD-10-CM

## 2014-04-03 DIAGNOSIS — K5900 Constipation, unspecified: Secondary | ICD-10-CM | POA: Insufficient documentation

## 2014-04-03 MED ORDER — SIMETHICONE 80 MG PO CHEW: 80.0000 mg | CHEWABLE_TABLET | Freq: Four times a day (QID) | ORAL | Status: DC | PRN

## 2014-04-03 MED ORDER — CVS PROBIOTIC (LACTOBACILLUS) PO CAPS: 1.0000 | ORAL_CAPSULE | Freq: Every day | ORAL | Status: DC

## 2014-04-03 NOTE — Patient Instructions (Addendum)
Your physician has recommended you make the following change in your medication: start taking Simethicone 1 tablet daily (OTC) and Lactobacillus 1 tablet daily (OTC)  Your physician wants you to follow-up in: 6 months. You will receive a reminder letter in the mail two months in advance. If you don't receive a letter, please call our office to schedule the follow-up appointment.

## 2014-04-03 NOTE — Progress Notes (Signed)
Patient ID: Krystal Hensley, female   DOB: 11-11-1917, 80 y.o.   MRN: [id number]    Patient Name: Krystal Hensley Date of Encounter: 04/03/2014  Primary Care Provider:  [name], MD Primary Cardiologist:  Dorothy Spark  Problem List   Past Medical History  Diagnosis Date  . Hypertension   . Arthritis   . Atrial fibrillation   . Back pain   . Colon cancer   . Polymyalgia rheumatica   . Hypoglycemia   . HTN (hypertension) 08/08/2013  . A-fib 08/08/2013  . Stroke    Past Surgical History  Procedure Laterality Date  . Colon surgery    . Cesarean section    . Bilateral oophorectomy    . Eye surgery     Allergies  Allergies  Allergen Reactions  . Statins Other (See Comments)    myalgia    HPI  The patient is a 80 year old woman with a past medical history significant for hypertension and paroxysmal atrial fibrillation who has been experiencing intermittent chest pressure since 2:00 this morning. She said she normally wakes up at approximately 2 AM and does not sleep much. She was not awoken by the chest pressure. However, she described it as "something sitting on my chest". She took a chewable baby aspirin and went to sleep. By the time she woke later this morning, the chest pressure had resolved but she felt significantly fatigued. She called her daughter and went to an urgent care. From there, 911 was called. She denied associated shortness of breath, dizziness, and palpitations. She sleeps with 2 pillows for comfort and to help her breathe better, but has done so for the past 10 years. She said she sustained a stroke this past February and takes aspirin only. She does not believe anticoagulants were ever discussed. She said she had a cardiac catheterization approximately 25 years ago that was normal. She said it was at that time that an abnormal heart rhythm was discovered. She denies leg swelling and paroxysmal nocturnal dyspnea.  Last fall, she was walking to  her front door too quickly, missed a step and fell and fractured some ribs.  She has been given a nitro patch and while the chest pressure has diminished, it has not completely resolved.  1. Chest pain: No further chest pain. Troponins normal, and has ruled out for an acute coronary syndrome. Initial symptoms were concerning for a coronary etiology. ECG demonstrated sinus rhythm with nonspecific ST changes with possible anteroseptal infarct, unchanged from ECG in 08/2013. Agree with continuation of ASA, beta blocker, and prn nitro at discharge. BNP 1065 on 7/11 in setting of chest pressure and initial BP of 172/102. BNP elevation can be due to elevated end-diastolic filling pressures with severe HTN, as well as ischemia. No evidence of heart failure. Echo in 08/2013 demonstrated vigorous LV systolic function, EF 36-64%, with mild LVH, grade I diastolic dysfunction, and mild mitral regurgitation.  Would recommend consideration for outpatient stress testing. She also has presumably new rib fractures not seen on prior chest films, which is worrisome for pathologic fractures, as she denies h/o recent falls.   02/02/2014 - the patient states that since the discharge she hasn't experienced any chest pressure. However she has noticed in the last year that she is more fatigued and weak, specifically her legs on are very weak while she's walking she has been feeling mildly short of breath but states that when she got nitroglycerin patch in the hospital she felt like her  whole new person. She denies any palpitations or syncope.  04/03/2014 - she reports improvement of fatigue since we decreased Toprol dose. No more chest pain. Her current problem constipation and diarrhea after OTC stool softeners.   Home Medications  Prior to Admission medications   Medication Sig Start Date End Date Taking? Authorizing Provider  aspirin 81 MG chewable tablet Chew 1 tablet (81 mg total) by mouth daily. 08/19/13  Yes Daniel J  Angiulli, PA-C  calcium-vitamin D (OSCAL WITH D) 500-200 MG-UNIT per tablet Take 1 tablet by mouth daily.   Yes Historical Provider, MD  lidocaine (LIDODERM) 5 % Place 1 patch onto the skin daily.  09/13/13  Yes Historical Provider, MD  metoprolol succinate (TOPROL-XL) 50 MG 24 hr tablet Take 25-50 mg by mouth 2 (two) times daily. Take 1 tablet in the AM and 0.5 tablet in PM   Yes Historical Provider, MD  Multiple Vitamins-Minerals (OCUVITE-LUTEIN PO) Take 1 tablet by mouth daily.   Yes Historical Provider, MD  omeprazole (PRILOSEC OTC) 20 MG tablet Take 20 mg by mouth every other day.   Yes Historical Provider, MD  predniSONE (DELTASONE) 5 MG tablet Take 5 mg by mouth daily.   Yes Historical Provider, MD    Family History  Family History  Problem Relation Age of Onset  . Aneurysm Mother   . Heart attack Father     Social History  History   Social History  . Marital Status: Widowed    Spouse Name: N/A    Number of Children: N/A  . Years of Education: N/A   Occupational History  . Not on file.   Social History Main Topics  . Smoking status: Never Smoker   . Smokeless tobacco: Not on file  . Alcohol Use: No  . Drug Use: No  . Sexual Activity: No   Other Topics Concern  . Not on file   Social History Narrative  . No narrative on file     Review of Systems, as per HPI, otherwise negative General:  No chills, fever, night sweats or weight changes.  Cardiovascular:  No chest pain, dyspnea on exertion, edema, orthopnea, palpitations, paroxysmal nocturnal dyspnea. Dermatological: No rash, lesions/masses Respiratory: No cough, dyspnea Urologic: No hematuria, dysuria Abdominal:   No nausea, vomiting, diarrhea, bright red blood per rectum, melena, or hematemesis Neurologic:  No visual changes, wkns, changes in mental status. All other systems reviewed and are otherwise negative except as noted above.  Physical Exam  There were no vitals taken for this visit.  General:  Pleasant, NAD Psych: Normal affect. Neuro: Alert and oriented X 3. Moves all extremities spontaneously. HEENT: Normal  Neck: Supple without bruits or JVD. Lungs:  Resp regular and unlabored, CTA. Heart: RRR no s3, s4, or murmurs. Abdomen: Soft, non-tender, non-distended, BS + x 4.  Extremities: No clubbing, cyanosis or edema. DP/PT/Radials 1+ and equal bilaterally.  Labs:  No results found for this basename: CKTOTAL, CKMB, TROPONINI,  in the last 72 hours Lab Results  Component Value Date   WBC 11.4* 01/14/2014   HGB 13.8 01/14/2014   HCT 41.3 01/14/2014   MCV 98.3 01/14/2014   PLT 226 01/14/2014    No results found for this basename: DDIMER   No components found with this basename: POCBNP,     Component Value Date/Time   NA 138 01/14/2014 1039   K 4.4 01/14/2014 1039   CL 100 01/14/2014 1039   CO2 26 01/14/2014 1039   GLUCOSE 114* 01/14/2014 1039  BUN 11 01/14/2014 1039   CREATININE 0.54 01/14/2014 1039   CALCIUM 9.0 01/14/2014 1039   PROT 6.0 08/11/2013 0720   ALBUMIN 3.0* 08/11/2013 0720   AST 27 08/11/2013 0720   ALT 15 08/11/2013 0720   ALKPHOS 79 08/11/2013 0720   BILITOT 0.7 08/11/2013 0720   GFRNONAA 79* 01/14/2014 1039   GFRAA >90 01/14/2014 1039   Lab Results  Component Value Date   CHOL 216* 08/09/2013   HDL 73 08/09/2013   LDLCALC 120* 08/09/2013   TRIG 116 08/09/2013    Accessory Clinical Findings  Echocardiogram - 08/08/13 Left ventricle: The cavity size was normal. Wall thickness was increased in a pattern of mild LVH. Systolic function was vigorous. The estimated ejection fraction was in the range of 65% to 70%. Doppler parameters are consistent with abnormal left ventricular relaxation (grade 1 diastolic dysfunction). - Mitral valve: Mild regurgitation. - Pulmonary arteries: PA peak pressure: 24mm Hg (S).  ECG - normal sinus rhythm, 78 beats per minute, incomplete left bundle branch block.   Assessment & Plan  1. Chest pain: 1 isolated episodes however the patient is  describing overall fatigue that has been worsening. These might be agitated to her age however she had improvement in her symptoms when she received nitroglycerin patch in the hospital. Improvement of fatigue with decreased toprol dose.  We will add Imdur 30 mg daily.  I had a long discussion with patient and her daughter regarding risk and benefits of proceeding of ischemic evaluation. Right now she would rather do medical management. She wants to hold on the stress test unless her symptoms recur.  Patient's daughter will inform us about the progress of her symptoms or any worsening angina or worsening of hypertension.  2. Hypertension - well controlled, appropriate for age  61. Moderate pulmonary hypertension - adding long acting nitrate might help with shortness of breath.  4. Atrial fibrillation: Appears to be paroxysmal. She is only on ASA. CHADS-VASC score is 6/9, thus anticoagulation with perhaps a target-specific agent is favored. Risk benefit ratio must be weighed given age and h/o falls. Continue beta blocker and ASA for now.   5. Constipation diarrhea, gas - add simethicon and probiotics  Follow up in 6 months   Dorothy Spark, MD, [facility] 04/03/2014, 1:50 PM

## 2014-04-13 ENCOUNTER — Ambulatory Visit: Payer: Medicare Other | Admitting: Podiatry

## 2014-04-13 ENCOUNTER — Other Ambulatory Visit: Payer: Medicare Other | Admitting: Podiatry

## 2014-04-13 DIAGNOSIS — B351 Tinea unguium: Secondary | ICD-10-CM

## 2014-04-13 DIAGNOSIS — M79673 Pain in unspecified foot: Secondary | ICD-10-CM

## 2014-05-08 ENCOUNTER — Emergency Department (HOSPITAL_COMMUNITY): Payer: Medicare Other

## 2014-05-08 ENCOUNTER — Encounter (HOSPITAL_COMMUNITY): Payer: Self-pay | Admitting: Nurse Practitioner

## 2014-05-08 ENCOUNTER — Emergency Department (HOSPITAL_COMMUNITY)
Admission: EM | Admit: 2014-05-08 | Discharge: 2014-05-08 | Disposition: A | Discharge status: Home / Self Care | Payer: Medicare Other | Attending: Emergency Medicine | Admitting: Emergency Medicine

## 2014-05-08 DIAGNOSIS — S2231XA Fracture of one rib, right side, initial encounter for closed fracture: Secondary | ICD-10-CM | POA: Diagnosis not present

## 2014-05-08 DIAGNOSIS — Z8639 Personal history of other endocrine, nutritional and metabolic disease: Secondary | ICD-10-CM | POA: Diagnosis not present

## 2014-05-08 DIAGNOSIS — W2203XA Walked into furniture, initial encounter: Secondary | ICD-10-CM | POA: Insufficient documentation

## 2014-05-08 DIAGNOSIS — S0093XA Contusion of unspecified part of head, initial encounter: Secondary | ICD-10-CM | POA: Insufficient documentation

## 2014-05-08 DIAGNOSIS — Z791 Long term (current) use of non-steroidal anti-inflammatories (NSAID): Secondary | ICD-10-CM | POA: Insufficient documentation

## 2014-05-08 DIAGNOSIS — Y9389 Activity, other specified: Secondary | ICD-10-CM | POA: Diagnosis not present

## 2014-05-08 DIAGNOSIS — Z8673 Personal history of transient ischemic attack (TIA), and cerebral infarction without residual deficits: Secondary | ICD-10-CM | POA: Insufficient documentation

## 2014-05-08 DIAGNOSIS — M1990 Unspecified osteoarthritis, unspecified site: Secondary | ICD-10-CM | POA: Insufficient documentation

## 2014-05-08 DIAGNOSIS — Z7952 Long term (current) use of systemic steroids: Secondary | ICD-10-CM | POA: Insufficient documentation

## 2014-05-08 DIAGNOSIS — I4891 Unspecified atrial fibrillation: Secondary | ICD-10-CM | POA: Diagnosis not present

## 2014-05-08 DIAGNOSIS — T148XXA Other injury of unspecified body region, initial encounter: Secondary | ICD-10-CM

## 2014-05-08 DIAGNOSIS — W19XXXA Unspecified fall, initial encounter: Secondary | ICD-10-CM

## 2014-05-08 DIAGNOSIS — Y9289 Other specified places as the place of occurrence of the external cause: Secondary | ICD-10-CM | POA: Insufficient documentation

## 2014-05-08 DIAGNOSIS — Z85038 Personal history of other malignant neoplasm of large intestine: Secondary | ICD-10-CM | POA: Insufficient documentation

## 2014-05-08 DIAGNOSIS — Z79899 Other long term (current) drug therapy: Secondary | ICD-10-CM | POA: Diagnosis not present

## 2014-05-08 DIAGNOSIS — I10 Essential (primary) hypertension: Secondary | ICD-10-CM | POA: Diagnosis not present

## 2014-05-08 DIAGNOSIS — Z7982 Long term (current) use of aspirin: Secondary | ICD-10-CM | POA: Insufficient documentation

## 2014-05-08 DIAGNOSIS — S0291XA Unspecified fracture of skull, initial encounter for closed fracture: Secondary | ICD-10-CM | POA: Diagnosis not present

## 2014-05-08 DIAGNOSIS — S0990XA Unspecified injury of head, initial encounter: Secondary | ICD-10-CM | POA: Diagnosis present

## 2014-05-08 MED ORDER — ACETAMINOPHEN 500 MG PO TABS
1000.0000 mg | ORAL_TABLET | Freq: Once | ORAL | Status: AC
  Administered 2014-05-08: 1000 mg via ORAL
  Filled 2014-05-08: qty 2

## 2014-05-08 NOTE — ED Notes (Signed)
Pt presented by EMS from home, report of a head injury secondary to a fall, inspection reveal a frontal left hematoma, right leg hematoma which pt states was also as a result of a fall. Denies LOC, denies being on blood thinner, rates head pain 5/10 and right ribs. Pt in NAD.

## 2014-05-08 NOTE — Discharge Instructions (Signed)
Concussion A concussion, or closed-head injury, is a brain injury caused by a direct blow to the head or by a quick and sudden movement (jolt) of the head or neck. Concussions are usually not life-threatening. Even so, the effects of a concussion can be serious. If you have had a concussion before, you are more likely to experience concussion-like symptoms after a direct blow to the head.  CAUSES  Direct blow to the head, such as from running into another player during a soccer game, being hit in a fight, or hitting your head on a hard surface.  A jolt of the head or neck that causes the brain to move back and forth inside the skull, such as in a car crash. SIGNS AND SYMPTOMS The signs of a concussion can be hard to notice. Early on, they may be missed by you, family members, and health care providers. You may look fine but act or feel differently. Symptoms are usually temporary, but they may last for days, weeks, or even longer. Some symptoms may appear right away while others may not show up for hours or days. Every head injury is different. Symptoms include:  Mild to moderate headaches that will not go away.  A feeling of pressure inside your head.  Having more trouble than usual:  Learning or remembering things you have heard.  Answering questions.  Paying attention or concentrating.  Organizing daily tasks.  Making decisions and solving problems.  Slowness in thinking, acting or reacting, speaking, or reading.  Getting lost or being easily confused.  Feeling tired all the time or lacking energy (fatigued).  Feeling drowsy.  Sleep disturbances.  Sleeping more than usual.  Sleeping less than usual.  Trouble falling asleep.  Trouble sleeping (insomnia).  Loss of balance or feeling lightheaded or dizzy.  Nausea or vomiting.  Numbness or tingling.  Increased sensitivity to:  Sounds.  Lights.  Distractions.  Vision problems or eyes that tire  easily.  Diminished sense of taste or smell.  Ringing in the ears.  Mood changes such as feeling sad or anxious.  Becoming easily irritated or angry for little or no reason.  Lack of motivation.  Seeing or hearing things other people do not see or hear (hallucinations). DIAGNOSIS Your health care provider can usually diagnose a concussion based on a description of your injury and symptoms. He or she will ask whether you passed out (lost consciousness) and whether you are having trouble remembering events that happened right before and during your injury. Your evaluation might include:  A brain scan to look for signs of injury to the brain. Even if the test shows no injury, you may still have a concussion.  Blood tests to be sure other problems are not present. TREATMENT  Concussions are usually treated in an emergency department, in urgent care, or at a clinic. You may need to stay in the hospital overnight for further treatment.  Tell your health care provider if you are taking any medicines, including prescription medicines, over-the-counter medicines, and natural remedies. Some medicines, such as blood thinners (anticoagulants) and aspirin, may increase the chance of complications. Also tell your health care provider whether you have had alcohol or are taking illegal drugs. This information may affect treatment.  Your health care provider will send you home with important instructions to follow.  How fast you will recover from a concussion depends on many factors. These factors include how severe your concussion is, what part of your brain was injured, your  age, and how healthy you were before the concussion.  Most people with mild injuries recover fully. Recovery can take time. In general, recovery is slower in older persons. Also, persons who have had a concussion in the past or have other medical problems may find that it takes longer to recover from their current injury. HOME  CARE INSTRUCTIONS General Instructions  Carefully follow the directions your health care provider gave you.  Only take over-the-counter or prescription medicines for pain, discomfort, or fever as directed by your health care provider.  Take only those medicines that your health care provider has approved.  Do not drink alcohol until your health care provider says you are well enough to do so. Alcohol and certain other drugs may slow your recovery and can put you at risk of further injury.  If it is harder than usual to remember things, write them down.  If you are easily distracted, try to do one thing at a time. For example, do not try to watch TV while fixing dinner.  Talk with family members or close friends when making important decisions.  Keep all follow-up appointments. Repeated evaluation of your symptoms is recommended for your recovery.  Watch your symptoms and tell others to do the same. Complications sometimes occur after a concussion. Older adults with a brain injury may have a higher risk of serious complications, such as a blood clot on the brain.  Tell your teachers, school nurse, school counselor, coach, athletic trainer, or work Freight forwarder about your injury, symptoms, and restrictions. Tell them about what you can or cannot do. They should watch for:  Increased problems with attention or concentration.  Increased difficulty remembering or learning new information.  Increased time needed to complete tasks or assignments.  Increased irritability or decreased ability to cope with stress.  Increased symptoms.  Rest. Rest helps the brain to heal. Make sure you:  Get plenty of sleep at night. Avoid staying up late at night.  Keep the same bedtime hours on weekends and weekdays.  Rest during the day. Take daytime naps or rest breaks when you feel tired.  Limit activities that require a lot of thought or concentration. These include:  Doing homework or job-related  work.  Watching TV.  Working on the computer.  Avoid any situation where there is potential for another head injury (football, hockey, soccer, basketball, martial arts, downhill snow sports and horseback riding). Your condition will get worse every time you experience a concussion. You should avoid these activities until you are evaluated by the appropriate follow-up health care providers. Returning To Your Regular Activities You will need to return to your normal activities slowly, not all at once. You must give your body and brain enough time for recovery.  Do not return to sports or other athletic activities until your health care provider tells you it is safe to do so.  Ask your health care provider when you can drive, ride a bicycle, or operate heavy machinery. Your ability to react may be slower after a brain injury. Never do these activities if you are dizzy.  Ask your health care provider about when you can return to work or school. Preventing Another Concussion It is very important to avoid another brain injury, especially before you have recovered. In rare cases, another injury can lead to permanent brain damage, brain swelling, or death. The risk of this is greatest during the first 7-10 days after a head injury. Avoid injuries by:  Wearing a seat  belt when riding in a car.  Drinking alcohol only in moderation.  Wearing a helmet when biking, skiing, skateboarding, skating, or doing similar activities.  Avoiding activities that could lead to a second concussion, such as contact or recreational sports, until your health care provider says it is okay.  Taking safety measures in your home.  Remove clutter and tripping hazards from floors and stairways.  Use grab bars in bathrooms and handrails by stairs.  Place non-slip mats on floors and in bathtubs.  Improve lighting in dim areas. SEEK MEDICAL CARE IF:  You have increased problems paying attention or  concentrating.  You have increased difficulty remembering or learning new information.  You need more time to complete tasks or assignments than before.  You have increased irritability or decreased ability to cope with stress.  You have more symptoms than before. Seek medical care if you have any of the following symptoms for more than 2 weeks after your injury:  Lasting (chronic) headaches.  Dizziness or balance problems.  Nausea.  Vision problems.  Increased sensitivity to noise or light.  Depression or mood swings.  Anxiety or irritability.  Memory problems.  Difficulty concentrating or paying attention.  Sleep problems.  Feeling tired all the time. SEEK IMMEDIATE MEDICAL CARE IF:  You have severe or worsening headaches. These may be a sign of a blood clot in the brain.  You have weakness (even if only in one hand, leg, or part of the face).  You have numbness.  You have decreased coordination.  You vomit repeatedly.  You have increased sleepiness.  One pupil is larger than the other.  You have convulsions.  You have slurred speech.  You have increased confusion. This may be a sign of a blood clot in the brain.  You have increased restlessness, agitation, or irritability.  You are unable to recognize people or places.  You have neck pain.  It is difficult to wake you up.  You have unusual behavior changes.  You lose consciousness. MAKE SURE YOU:  Understand these instructions.  Will watch your condition.  Will get help right away if you are not doing well or get worse. Document Released: 09/13/2003 Document Revised: 06/28/2013 Document Reviewed: 01/13/2013 [facility] Patient Information 2015 [place], [place]. This information is not intended to replace advice given to you by your health care provider. Make sure you discuss any questions you have with your health care provider. Rib Fracture A rib fracture is a break or crack in one of the  bones of the ribs. The ribs are a group of long, curved bones that wrap around your chest and attach to your spine. They protect your lungs and other organs in the chest cavity. A broken or cracked rib is often painful, but most do not cause other problems. Most rib fractures heal on their own over time. However, rib fractures can be more serious if multiple ribs are broken or if broken ribs move out of place and push against other structures. CAUSES   A direct blow to the chest. For example, this could happen during contact sports, a car accident, or a fall against a hard object.  Repetitive movements with high force, such as pitching a baseball or having severe coughing spells. SYMPTOMS   Pain when you breathe in or cough.  Pain when someone presses on the injured area. DIAGNOSIS  Your caregiver will perform a physical exam. Various imaging tests may be ordered to confirm the diagnosis and to look for  related injuries. These tests may include a chest X-ray, computed tomography (CT), magnetic resonance imaging (MRI), or a bone scan. TREATMENT  Rib fractures usually heal on their own in 1-3 months. The longer healing period is often associated with a continued cough or other aggravating activities. During the healing period, pain control is very important. Medication is usually given to control pain. Hospitalization or surgery may be needed for more severe injuries, such as those in which multiple ribs are broken or the ribs have moved out of place.  HOME CARE INSTRUCTIONS   Avoid strenuous activity and any activities or movements that cause pain. Be careful during activities and avoid bumping the injured rib.  Gradually increase activity as directed by your caregiver.  Only take over-the-counter or prescription medications as directed by your caregiver. Do not take other medications without asking your caregiver first.  Apply ice to the injured area for the first 1-2 days after you have been  treated or as directed by your caregiver. Applying ice helps to reduce inflammation and pain.  Put ice in a plastic bag.  Place a towel between your skin and the bag.   Leave the ice on for 15-20 minutes at a time, every 2 hours while you are awake.  Perform deep breathing as directed by your caregiver. This will help prevent pneumonia, which is a common complication of a broken rib. Your caregiver may instruct you to:  Take deep breaths several times a day.  Try to cough several times a day, holding a pillow against the injured area.  Use a device called an incentive spirometer to practice deep breathing several times a day.  Drink enough fluids to keep your urine clear or pale yellow. This will help you avoid constipation.   Do not wear a rib belt or binder. These restrict breathing, which can lead to pneumonia.  SEEK IMMEDIATE MEDICAL CARE IF:   You have a fever.   You have difficulty breathing or shortness of breath.   You develop a continual cough, or you cough up thick or bloody sputum.  You feel sick to your stomach (nausea), throw up (vomit), or have abdominal pain.   You have worsening pain not controlled with medications.  MAKE SURE YOU:  Understand these instructions.  Will watch your condition.  Will get help right away if you are not doing well or get worse. Document Released: 06/23/2005 Document Revised: 02/23/2013 Document Reviewed: 08/25/2012 [facility] Patient Information 2015 [place], [place]. This information is not intended to replace advice given to you by your health care provider. Make sure you discuss any questions you have with your health care provider.  Contusion A contusion is a deep bruise. Contusions are the result of an injury that caused bleeding under the skin. The contusion may turn blue, purple, or yellow. Minor injuries will give you a painless contusion, but more severe contusions may stay painful and swollen for a few weeks.  CAUSES   A contusion is usually caused by a blow, trauma, or direct force to an area of the body. SYMPTOMS   Swelling and redness of the injured area.  Bruising of the injured area.  Tenderness and soreness of the injured area.  Pain. DIAGNOSIS  The diagnosis can be made by taking a history and physical exam. An X-ray, CT scan, or MRI may be needed to determine if there were any associated injuries, such as fractures. TREATMENT  Specific treatment will depend on what area of the body was injured.  In general, the best treatment for a contusion is resting, icing, elevating, and applying cold compresses to the injured area. Over-the-counter medicines may also be recommended for pain control. Ask your caregiver what the best treatment is for your contusion. HOME CARE INSTRUCTIONS   Put ice on the injured area.  Put ice in a plastic bag.  Place a towel between your skin and the bag.  Leave the ice on for 15-20 minutes, 3-4 times a day, or as directed by your health care provider.  Only take over-the-counter or prescription medicines for pain, discomfort, or fever as directed by your caregiver. Your caregiver may recommend avoiding anti-inflammatory medicines (aspirin, ibuprofen, and naproxen) for 48 hours because these medicines may increase bruising.  Rest the injured area.  If possible, elevate the injured area to reduce swelling. SEEK IMMEDIATE MEDICAL CARE IF:   You have increased bruising or swelling.  You have pain that is getting worse.  Your swelling or pain is not relieved with medicines. MAKE SURE YOU:   Understand these instructions.  Will watch your condition.  Will get help right away if you are not doing well or get worse. Document Released: 04/02/2005 Document Revised: 06/28/2013 Document Reviewed: 04/28/2011 [facility] Patient Information 2015 [place], [place]. This information is not intended to replace advice given to you by your health care provider. Make sure you  discuss any questions you have with your health care provider.

## 2014-05-08 NOTE — ED Provider Notes (Signed)
Medical screening examination/treatment/procedure(s) were conducted as a shared visit with non-physician practitioner(s) and myself.  I personally evaluated the patient during the encounter.   EKG Interpretation None     Patient here after mechanical fall. X-rays reviewed and shows a nondisplaced skull fracture as well as nondisplaced rib fracture. She denies any chest or back pain. Full range of motion with her hips. Patient stable for discharge and she will use her walker that she has at home and will take Tylenol as needed.  Leota Jacobsen, MD 05/08/14 [phone]

## 2014-05-08 NOTE — ED Provider Notes (Signed)
CSN: [id number]     Arrival date & time 05/08/14  1745 History   First MD Initiated Contact with Patient 05/08/14 1802     Chief Complaint  Patient presents with  . Head Injury  . Fall     (Consider location/radiation/quality/duration/timing/severity/associated sxs/prior Treatment) HPI Comments: Patient presents to the emergency department with chief complaint of mechanical. She states that she was working on her TV, when she tripped and fell. She states that she tried to catch herself, but struck her head on a piece of furniture. She reports also hitting her right ribs, and right leg. She reports 6 out of 10 pain. She denies any loss of consciousness. Denies any weakness, numbness, or difficulty moving. She denies any nausea, or vomiting. She has not taken anything to alleviate her symptoms. Her pain is aggravated with movement and palpation.  The history is provided by the patient. No language interpreter was used.    Past Medical History  Diagnosis Date  . Hypertension   . Arthritis   . Atrial fibrillation   . Back pain   . Colon cancer   . Polymyalgia rheumatica   . Hypoglycemia   . HTN (hypertension) 08/08/2013  . A-fib 08/08/2013  . Stroke    Past Surgical History  Procedure Laterality Date  . Colon surgery    . Cesarean section    . Bilateral oophorectomy    . Eye surgery     Family History  Problem Relation Age of Onset  . Aneurysm Mother   . Heart attack Father    History  Substance Use Topics  . Smoking status: Never Smoker   . Smokeless tobacco: Not on file  . Alcohol Use: No   OB History    No data available     Review of Systems  Constitutional: Negative for fever and chills.  Respiratory: Negative for shortness of breath.   Cardiovascular: Negative for chest pain.  Gastrointestinal: Negative for nausea, vomiting, diarrhea and constipation.  Genitourinary: Negative for dysuria.  Musculoskeletal: Positive for arthralgias.  Skin: Positive for color  change.  All other systems reviewed and are negative.     Allergies  Statins  Home Medications   Prior to Admission medications   Medication Sig Start Date End Date Taking? Authorizing Provider  acetaminophen (TYLENOL) 500 MG tablet Take 500 mg by mouth every 6 (six) hours as needed.    Historical Provider, MD  aspirin 81 MG chewable tablet Chew 1 tablet (81 mg total) by mouth daily. 08/19/13   Lavon Paganini Angiulli, PA-C  calcium-vitamin D (OSCAL WITH D) 500-200 MG-UNIT per tablet Take 1 tablet by mouth daily.    Historical Provider, MD  Cholecalciferol (VITAMIN D) 2000 UNITS tablet Take 2,000 Units by mouth daily.    Historical Provider, MD  diclofenac (FLECTOR) 1.3 % PTCH Place 1 patch onto the skin 2 (two) times daily.    Historical Provider, MD  isosorbide mononitrate (IMDUR) 30 MG 24 hr tablet Take 1 tablet (30 mg total) by mouth at bedtime. 02/02/14   Dorothy Spark, MD  Lactobacillus Rhamnosus, GG, ([employer] PROBIOTIC, LACTOBACILLUS,) CAPS Take 1 each by mouth daily. 04/03/14   Dorothy Spark, MD  metoprolol succinate (TOPROL XL) 25 MG 24 hr tablet Take 1 tablet (25 mg total) by mouth every morning. 02/02/14   Dorothy Spark, MD  omeprazole (PRILOSEC OTC) 20 MG tablet Take 20 mg by mouth every other day.    Historical Provider, MD  polyethylene glycol (MIRALAX /  GLYCOLAX) packet Take 1.5 packets mixed with 8 ounces of fluid once daily.    Historical Provider, MD  predniSONE (DELTASONE) 5 MG tablet Take 5 mg by mouth daily.    Historical Provider, MD  saccharomyces boulardii (FLORASTOR) 250 MG capsule Take 250 mg by mouth daily.    Historical Provider, MD  simethicone (GAS-X) 80 MG chewable tablet Chew 1 tablet (80 mg total) by mouth every 6 (six) hours as needed for flatulence. 04/03/14   Dorothy Spark, MD  sodium chloride (OCEAN) 0.65 % SOLN nasal spray Place 1 spray into both nostrils as needed for congestion.    Historical Provider, MD   BP 179/104 mmHg  Pulse 100  Temp(Src)  98.4 F (36.9 C) (Oral)  Resp 14  SpO2 98% Physical Exam  Constitutional: She is oriented to person, place, and time. She appears well-developed and well-nourished.  HENT:  Head: Normocephalic and atraumatic.  Left frontal hematoma, no laceration, tender to palpation  Eyes: Conjunctivae and EOM are normal. Pupils are equal, round, and reactive to light.  Right pupil is enlarged, this is baseline  Neck: Normal range of motion. Neck supple.  Cardiovascular: Normal rate and regular rhythm.  Exam reveals no gallop and no friction rub.   No murmur heard. Pulmonary/Chest: Effort normal and breath sounds normal. No respiratory distress. She has no wheezes. She has no rales. She exhibits no tenderness.  Right ribs tender to palpation, no contusions, clear to auscultation  Abdominal: Soft. Bowel sounds are normal. She exhibits no distension and no mass. There is no tenderness. There is no rebound and no guarding.  Musculoskeletal: Normal range of motion. She exhibits no edema or tenderness.  No bony step-off, difficulty, or abnormality about the CTLS spine, moves all extremities  Neurological: She is alert and oriented to person, place, and time.  CN III-12 intact throughout, speech is clear, movements are goal oriented  Skin: Skin is warm and dry.  Contusion to right lower extremity  Psychiatric: She has a normal mood and affect. Her behavior is normal. Judgment and thought content normal.  Nursing note and vitals reviewed.   ED Course  Procedures (including critical care time) Results for orders placed or performed during the hospital encounter of 01/14/14  CBC with Differential  Result Value Ref Range   WBC 11.4 (H) 4.0 - 10.5 K/uL   RBC 4.20 3.87 - 5.11 MIL/uL   Hemoglobin 13.8 12.0 - 15.0 g/dL   HCT 41.3 36.0 - 46.0 %   MCV 98.3 78.0 - 100.0 fL   MCH 32.9 26.0 - 34.0 pg   MCHC 33.4 30.0 - 36.0 g/dL   RDW 14.0 11.5 - 15.5 %   Platelets 226 150 - 400 K/uL   Neutrophils Relative %  85 (H) 43 - 77 %   Neutro Abs 9.8 (H) 1.7 - 7.7 K/uL   Lymphocytes Relative 6 (L) 12 - 46 %   Lymphs Abs 0.7 0.7 - 4.0 K/uL   Monocytes Relative 8 3 - 12 %   Monocytes Absolute 0.9 0.1 - 1.0 K/uL   Eosinophils Relative 1 0 - 5 %   Eosinophils Absolute 0.1 0.0 - 0.7 K/uL   Basophils Relative 0 0 - 1 %   Basophils Absolute 0.0 0.0 - 0.1 K/uL  Basic metabolic panel  Result Value Ref Range   Sodium 138 137 - 147 mEq/L   Potassium 4.4 3.7 - 5.3 mEq/L   Chloride 100 96 - 112 mEq/L   CO2 26 19 -  32 mEq/L   Glucose, Bld 114 (H) 70 - 99 mg/dL   BUN 11 6 - 23 mg/dL   Creatinine, Ser 0.54 0.50 - 1.10 mg/dL   Calcium 9.0 8.4 - 10.5 mg/dL   GFR calc non Af Amer 79 (L) >90 mL/min   GFR calc Af Amer >90 >90 mL/min   Anion gap 12 5 - 15  Troponin I  Result Value Ref Range   Troponin I <0.30 <0.30 ng/mL  Pro b natriuretic peptide  Result Value Ref Range   Pro B Natriuretic peptide (BNP) 1065.0 (H) 0 - 450 pg/mL  Troponin I-serum (0, 3, 6 hours)  Result Value Ref Range   Troponin I <0.30 <0.30 ng/mL  Troponin I-serum (0, 3, 6 hours)  Result Value Ref Range   Troponin I <0.30 <0.30 ng/mL  Glucose, capillary  Result Value Ref Range   Glucose-Capillary 115 (H) 70 - 99 mg/dL   Comment 1 Documented in Chart    Comment 2 Notify RN    Dg Ribs Unilateral W/chest Right  05/08/2014   CLINICAL DATA:  Right anterior lower rib pain after falling at home today.  EXAM: RIGHT RIBS AND CHEST - 3+ VIEW  COMPARISON:  Portable chest dated 01/14/2014.  FINDINGS: Mildly progressive enlargement of the cardiac silhouette and mildly progressive prominence of the pulmonary vasculature. Stable minimal interstitial prominence. Diffuse osteopenia. Evidence of chronic bilateral rotator cuff tears with superior migration of the humeral heads and bony remodeling. Moderate scoliosis is unchanged. Mildly displaced right eighth anterior rib fracture. No pneumothorax.  IMPRESSION: 1. Mildly displaced right anterior eighth rib  fracture. 2. No pneumothorax. 3. Mildly progressive cardiomegaly and pulmonary vascular congestion. 4. Stable minimal chronic interstitial lung disease.   Electronically Signed   By: Enrique Sack M.D.   On: 05/08/2014 19:00   Dg Tibia/fibula Right  05/08/2014   CLINICAL DATA:  Right mid lower leg pain and bruising after falling at home today.  EXAM: RIGHT TIBIA AND FIBULA - 2 VIEW  COMPARISON:  Right ankle dated 11/27/2012.  FINDINGS: Diffuse osteopenia. No fracture or dislocation seen. Arterial calcifications. Mild calcaneal spur formation.  IMPRESSION: No fracture or dislocation.   Electronically Signed   By: Enrique Sack M.D.   On: 05/08/2014 19:01   Ct Head Wo Contrast  05/08/2014   CLINICAL DATA:  Patient fell at home, hitting forehead. Patient with left frontal hematoma. Denies loss of consciousness. History of hypertension and stroke.  EXAM: CT HEAD WITHOUT CONTRAST  CT CERVICAL SPINE WITHOUT CONTRAST  TECHNIQUE: Multidetector CT imaging of the head and cervical spine was performed following the standard protocol without intravenous contrast. Multiplanar CT image reconstructions of the cervical spine were also generated.  COMPARISON:  Head CT, 08/08/2013.  Cervical CT, 03/14/2012.  FINDINGS: CT HEAD FINDINGS  Left frontal scalp hematoma. There is equivocal evidence of a small underlying nondepressed left frontal bone fracture.  Ventricles are normal in configuration. There is ventricular and sulcal enlargement reflecting mild generalized atrophy. No parenchymal masses or mass effect. There is no evidence of an infarct. Patchy white matter hypoattenuation is noted consistent with moderate chronic microvascular ischemic change.  There are no extra-axial masses or abnormal fluid collections.  No intracranial hemorrhage.  Visualized sinuses and mastoid air cells are clear.  CT CERVICAL SPINE FINDINGS  No fracture.  There is a prominent kyphosis centered at C5. Anterolistheses are noted of C2 and C3, C3 and C4  and C4 on C5, all all grade 1, greatest at  C3-C4, all degenerative in origin. There is mild loss of disc height at C3-C4 and C4-C5 with marked loss of disc height at C5-C6 and C6-C7. Endplate spurring is noted most evident at C5-C6 and C6-C7. There are facet degenerative changes bilaterally most prominent on the right at C2-C3 and C3-C4 and on the left at C3-C4. Bones are diffusely demineralized.  Soft tissues show vascular calcifications, but no masses or adenopathy. Lung apices show mild scarring.  IMPRESSION: HEAD CT: No acute intracranial abnormality. Left frontal scalp hematoma. Possible small subtle nondepressed left frontal bone fracture deep to the scalp hematoma.  CERVICAL CT: No fracture or acute finding. No significant change from the prior study.   Electronically Signed   By: Lajean Manes M.D.   On: 05/08/2014 18:44   Ct Cervical Spine Wo Contrast  05/08/2014   CLINICAL DATA:  Patient fell at home, hitting forehead. Patient with left frontal hematoma. Denies loss of consciousness. History of hypertension and stroke.  EXAM: CT HEAD WITHOUT CONTRAST  CT CERVICAL SPINE WITHOUT CONTRAST  TECHNIQUE: Multidetector CT imaging of the head and cervical spine was performed following the standard protocol without intravenous contrast. Multiplanar CT image reconstructions of the cervical spine were also generated.  COMPARISON:  Head CT, 08/08/2013.  Cervical CT, 03/14/2012.  FINDINGS: CT HEAD FINDINGS  Left frontal scalp hematoma. There is equivocal evidence of a small underlying nondepressed left frontal bone fracture.  Ventricles are normal in configuration. There is ventricular and sulcal enlargement reflecting mild generalized atrophy. No parenchymal masses or mass effect. There is no evidence of an infarct. Patchy white matter hypoattenuation is noted consistent with moderate chronic microvascular ischemic change.  There are no extra-axial masses or abnormal fluid collections.  No intracranial hemorrhage.   Visualized sinuses and mastoid air cells are clear.  CT CERVICAL SPINE FINDINGS  No fracture.  There is a prominent kyphosis centered at C5. Anterolistheses are noted of C2 and C3, C3 and C4 and C4 on C5, all all grade 1, greatest at C3-C4, all degenerative in origin. There is mild loss of disc height at C3-C4 and C4-C5 with marked loss of disc height at C5-C6 and C6-C7. Endplate spurring is noted most evident at C5-C6 and C6-C7. There are facet degenerative changes bilaterally most prominent on the right at C2-C3 and C3-C4 and on the left at C3-C4. Bones are diffusely demineralized.  Soft tissues show vascular calcifications, but no masses or adenopathy. Lung apices show mild scarring.  IMPRESSION: HEAD CT: No acute intracranial abnormality. Left frontal scalp hematoma. Possible small subtle nondepressed left frontal bone fracture deep to the scalp hematoma.  CERVICAL CT: No fracture or acute finding. No significant change from the prior study.   Electronically Signed   By: Lajean Manes M.D.   On: 05/08/2014 18:44      EKG Interpretation None      MDM   Final diagnoses:  Fall  Right rib fracture, closed, initial encounter  Skull fracture, closed, initial encounter  Hematoma     ICD-9-CM ICD-10-CM   1. Right rib fracture, closed, initial encounter 807.00 S22.31XA   2. Fall E888.9 W19.XXXA CT Head Wo Contrast     CT Cervical Spine Wo Contrast     DG Ribs Unilateral W/Chest Right     DG Tibia/Fibula Right     CT Head Wo Contrast     CT Cervical Spine Wo Contrast     DG Ribs Unilateral W/Chest Right     DG Tibia/Fibula Right  3. Skull fracture, closed, initial encounter 803.00 S02.91XA   4. Hematoma 924.9 T14.8      Patient with mechanical fall. She states that she tripped while working on her TV. She did not lose consciousness. Will obtain imaging.  Imaging as above.    Non-displaced skull fracture and rib fracture.  Pain controlled with tylenol.  Patient is stable and ready for  discharge.  Patient seen by and discussed with Dr. Zenia Resides, who agrees with the plan.   Montine Circle, PA-C 05/08/14 1942

## 2014-05-08 NOTE — ED Notes (Signed)
Bed: WA08 Expected date:  Expected time:  Means of arrival:  Comments: fall 

## 2014-05-11 ENCOUNTER — Other Ambulatory Visit: Payer: Self-pay | Admitting: Nurse Practitioner

## 2014-05-11 ENCOUNTER — Ambulatory Visit
Admission: RE | Admit: 2014-05-11 | Discharge: 2014-05-11 | Disposition: A | Discharge status: Home / Self Care | Payer: Medicare Other | Source: Ambulatory Visit | Attending: Nurse Practitioner | Admitting: Nurse Practitioner

## 2014-05-11 DIAGNOSIS — M25551 Pain in right hip: Secondary | ICD-10-CM

## 2014-06-14 ENCOUNTER — Emergency Department (HOSPITAL_COMMUNITY): Payer: Medicare Other

## 2014-06-14 ENCOUNTER — Observation Stay (HOSPITAL_COMMUNITY)
Admission: EM | Admit: 2014-06-14 | Discharge: 2014-06-15 | Disposition: A | Discharge status: Home / Self Care | Payer: Medicare Other | Attending: Cardiology | Admitting: Cardiology

## 2014-06-14 ENCOUNTER — Encounter (HOSPITAL_COMMUNITY): Payer: Self-pay | Admitting: Cardiology

## 2014-06-14 DIAGNOSIS — R000 Tachycardia, unspecified: Secondary | ICD-10-CM

## 2014-06-14 DIAGNOSIS — I712 Thoracic aortic aneurysm, without rupture: Secondary | ICD-10-CM | POA: Diagnosis not present

## 2014-06-14 DIAGNOSIS — I4891 Unspecified atrial fibrillation: Secondary | ICD-10-CM

## 2014-06-14 DIAGNOSIS — Z7982 Long term (current) use of aspirin: Secondary | ICD-10-CM | POA: Diagnosis not present

## 2014-06-14 DIAGNOSIS — Z79899 Other long term (current) drug therapy: Secondary | ICD-10-CM | POA: Insufficient documentation

## 2014-06-14 DIAGNOSIS — Z8673 Personal history of transient ischemic attack (TIA), and cerebral infarction without residual deficits: Secondary | ICD-10-CM | POA: Diagnosis not present

## 2014-06-14 DIAGNOSIS — I10 Essential (primary) hypertension: Secondary | ICD-10-CM | POA: Diagnosis not present

## 2014-06-14 DIAGNOSIS — M353 Polymyalgia rheumatica: Secondary | ICD-10-CM | POA: Diagnosis not present

## 2014-06-14 DIAGNOSIS — Z888 Allergy status to other drugs, medicaments and biological substances status: Secondary | ICD-10-CM | POA: Diagnosis not present

## 2014-06-14 DIAGNOSIS — R0602 Shortness of breath: Secondary | ICD-10-CM

## 2014-06-14 DIAGNOSIS — Z85038 Personal history of other malignant neoplasm of large intestine: Secondary | ICD-10-CM | POA: Diagnosis not present

## 2014-06-14 DIAGNOSIS — Z7952 Long term (current) use of systemic steroids: Secondary | ICD-10-CM | POA: Insufficient documentation

## 2014-06-14 DIAGNOSIS — R0781 Pleurodynia: Secondary | ICD-10-CM | POA: Insufficient documentation

## 2014-06-14 DIAGNOSIS — I4892 Unspecified atrial flutter: Secondary | ICD-10-CM

## 2014-06-14 DIAGNOSIS — I639 Cerebral infarction, unspecified: Secondary | ICD-10-CM | POA: Diagnosis present

## 2014-06-14 DIAGNOSIS — I480 Paroxysmal atrial fibrillation: Secondary | ICD-10-CM | POA: Diagnosis present

## 2014-06-14 HISTORY — DX: Unspecified atrial fibrillation: I48.91

## 2014-06-14 LAB — I-STAT TROPONIN, ED: Troponin i, poc: 0.02 ng/mL (ref 0.00–0.08)

## 2014-06-14 LAB — CBC
HCT: 44 % (ref 36.0–46.0)
Hemoglobin: 14.9 g/dL (ref 12.0–15.0)
MCH: 33.7 pg (ref 26.0–34.0)
MCHC: 33.9 g/dL (ref 30.0–36.0)
MCV: 99.5 fL (ref 78.0–100.0)
PLATELETS: 229 10*3/uL (ref 150–400)
RBC: 4.42 MIL/uL (ref 3.87–5.11)
RDW: 14.7 % (ref 11.5–15.5)
WBC: 13.7 10*3/uL — ABNORMAL HIGH (ref 4.0–10.5)

## 2014-06-14 LAB — BASIC METABOLIC PANEL
Anion gap: 15 (ref 5–15)
BUN: 11 mg/dL (ref 6–23)
CALCIUM: 9.4 mg/dL (ref 8.4–10.5)
CO2: 21 mEq/L (ref 19–32)
CREATININE: 0.55 mg/dL (ref 0.50–1.10)
Chloride: 98 mEq/L (ref 96–112)
GFR calc Af Amer: >90 mL/min (ref >90)
GFR calc non Af Amer: 78 mL/min — ABNORMAL LOW (ref >90)
Glucose, Bld: 152 mg/dL — ABNORMAL HIGH (ref 70–99)
Potassium: 4.4 mEq/L (ref 3.7–5.3)
Sodium: 134 mEq/L — ABNORMAL LOW (ref 137–147)

## 2014-06-14 LAB — URINALYSIS, ROUTINE W REFLEX MICROSCOPIC
Bilirubin Urine: NEGATIVE
Glucose, UA: NEGATIVE mg/dL
Hgb urine dipstick: NEGATIVE
Ketones, ur: NEGATIVE mg/dL
Leukocytes, UA: NEGATIVE
NITRITE: NEGATIVE
PROTEIN: NEGATIVE mg/dL
Specific Gravity, Urine: 1.008 (ref 1.005–1.030)
Urobilinogen, UA: 0.2 mg/dL (ref 0.0–1.0)
pH: 7 (ref 5.0–8.0)

## 2014-06-14 MED ORDER — SODIUM CHLORIDE 0.9 % IV BOLUS (SEPSIS)
1000.0000 mL | Freq: Once | INTRAVENOUS | Status: AC
  Administered 2014-06-14: 1000 mL via INTRAVENOUS

## 2014-06-14 MED ORDER — DICLOFENAC EPOLAMINE 1.3 % TD PTCH
0.5000 | MEDICATED_PATCH | Freq: Two times a day (BID) | TRANSDERMAL | Status: DC
  Filled 2014-06-14 (×3): qty 1

## 2014-06-14 MED ORDER — ACETAMINOPHEN 325 MG PO TABS: 325.0000 mg | ORAL_TABLET | Freq: Every morning | ORAL | Status: DC

## 2014-06-14 MED ORDER — IOHEXOL 350 MG/ML SOLN
100.0000 mL | Freq: Once | INTRAVENOUS | Status: AC | PRN
  Administered 2014-06-14: 80 mL via INTRAVENOUS

## 2014-06-14 MED ORDER — PANTOPRAZOLE SODIUM 40 MG PO TBEC: 40.0000 mg | DELAYED_RELEASE_TABLET | ORAL | Status: DC

## 2014-06-14 MED ORDER — ISOSORBIDE MONONITRATE ER 30 MG PO TB24
30.0000 mg | ORAL_TABLET | Freq: Every day | ORAL | Status: DC
  Administered 2014-06-14: 30 mg via ORAL
  Filled 2014-06-14 (×2): qty 1

## 2014-06-14 MED ORDER — ACETAMINOPHEN 325 MG PO TABS
650.0000 mg | ORAL_TABLET | ORAL | Status: DC | PRN
  Administered 2014-06-15: 650 mg via ORAL
  Filled 2014-06-14: qty 2

## 2014-06-14 MED ORDER — OMEPRAZOLE MAGNESIUM 20 MG PO TBEC: 20.0000 mg | DELAYED_RELEASE_TABLET | ORAL | Status: DC

## 2014-06-14 MED ORDER — SODIUM CHLORIDE 0.9 % IV BOLUS (SEPSIS): 1000.0000 mL | Freq: Once | INTRAVENOUS | Status: DC

## 2014-06-14 MED ORDER — ASPIRIN 81 MG PO CHEW
81.0000 mg | CHEWABLE_TABLET | Freq: Every day | ORAL | Status: DC
Start: 2014-06-15 — End: 2014-06-15
  Administered 2014-06-15: 81 mg via ORAL
  Filled 2014-06-14: qty 1

## 2014-06-14 MED ORDER — ONDANSETRON HCL 4 MG/2ML IJ SOLN: 4.0000 mg | Freq: Four times a day (QID) | INTRAMUSCULAR | Status: DC | PRN

## 2014-06-14 MED ORDER — PREDNISONE 5 MG PO TABS
5.0000 mg | ORAL_TABLET | Freq: Every day | ORAL | Status: DC
  Administered 2014-06-15: 5 mg via ORAL
  Filled 2014-06-14 (×2): qty 1

## 2014-06-14 MED ORDER — DOCUSATE SODIUM 100 MG PO CAPS
200.0000 mg | ORAL_CAPSULE | Freq: Every morning | ORAL | Status: DC
  Filled 2014-06-14: qty 2

## 2014-06-14 MED ORDER — SENNA-DOCUSATE SODIUM 8.6-50 MG PO TABS
1.0000 | ORAL_TABLET | Freq: Every day | ORAL | Status: DC
  Administered 2014-06-14: 1 via ORAL
  Filled 2014-06-14 (×2): qty 1

## 2014-06-14 MED ORDER — METOPROLOL SUCCINATE ER 50 MG PO TB24
50.0000 mg | ORAL_TABLET | Freq: Every morning | ORAL | Status: DC
  Administered 2014-06-15: 50 mg via ORAL
  Filled 2014-06-14: qty 1

## 2014-06-14 NOTE — H&P (Signed)
Patient ID: Krystal Hensley MRN: [id number] DOB/AGE: January 09, 1918 80 y.o. Admit date: 06/14/2014  Primary Cardiologist: Meda Coffee  HPI: 80 yo female with history of HTN, paroxysmal atrial fib, PMR, HTN, CVA who is admitted with palpitations, heart racing. The symptoms started suddenly this am while she was doing chores around the house. She felt dizzy and weak. No chest pain. No syncope. She called 911 and was brought to the ED. EKG with atrial flutter, rate 127 bpm. Telemetry now with what appears to be sinus with PACs. She has no complaints currently except for being hungry.   Review of systems complete and found to be negative unless listed above   Past Medical History  Diagnosis Date  . Hypertension   . Arthritis   . Atrial fibrillation   . Back pain   . Colon cancer   . Polymyalgia rheumatica   . Hypoglycemia   . HTN (hypertension) 08/08/2013  . A-fib 08/08/2013  . Stroke     Family History  Problem Relation Age of Onset  . Aneurysm Mother   . Heart attack Father     History   Social History  . Marital Status: Widowed    Spouse Name: N/A    Number of Children: N/A  . Years of Education: N/A   Occupational History  . Not on file.   Social History Main Topics  . Smoking status: Never Smoker   . Smokeless tobacco: Not on file  . Alcohol Use: No  . Drug Use: No  . Sexual Activity: No   Other Topics Concern  . Not on file   Social History Narrative    Past Surgical History  Procedure Laterality Date  . Colon surgery    . Cesarean section    . Bilateral oophorectomy    . Eye surgery      Allergies  Allergen Reactions  . Statins Other (See Comments)    myalgia    Prior to Admission Meds:  Prior to Admission medications   Medication Sig Start Date End Date Taking? Authorizing Provider  acetaminophen (TYLENOL) 325 MG tablet Take 325 mg by mouth every morning.   Yes Historical Provider, MD  aspirin 81 MG chewable tablet Chew 1 tablet (81 mg total) by  mouth daily. 08/19/13  Yes Daniel J Angiulli, PA-C  calcium-vitamin D (OSCAL WITH D) 500-200 MG-UNIT per tablet Take 1 tablet by mouth daily.   Yes Historical Provider, MD  Cholecalciferol (VITAMIN D) 2000 UNITS tablet Take 2,000 Units by mouth daily.   Yes Historical Provider, MD  diclofenac (FLECTOR) 1.3 % PTCH Place 0.5 patches onto the skin 2 (two) times daily.    Yes Historical Provider, MD  Docusate Sodium (COLACE PO) Take 2 tablets by mouth every morning.    Yes Historical Provider, MD  docusate sodium (COLACE) 100 MG capsule Take 200 mg by mouth every morning.   Yes Historical Provider, MD  isosorbide mononitrate (IMDUR) 30 MG 24 hr tablet Take 1 tablet (30 mg total) by mouth at bedtime. 02/02/14  Yes Dorothy Spark, MD  metoprolol succinate (TOPROL XL) 25 MG 24 hr tablet Take 1 tablet (25 mg total) by mouth every morning. 02/02/14  Yes Dorothy Spark, MD  omeprazole (PRILOSEC OTC) 20 MG tablet Take 20 mg by mouth every other day.   Yes Historical Provider, MD  predniSONE (DELTASONE) 5 MG tablet Take 5 mg by mouth every morning.    Yes Historical Provider, MD  sennosides-docusate sodium (SENOKOT-S) 8.6-50  MG tablet Take 1 tablet by mouth at bedtime.   Yes Historical Provider, MD  sodium chloride (OCEAN) 0.65 % SOLN nasal spray Place 1 spray into both nostrils as needed for congestion.   Yes Historical Provider, MD  Lactobacillus Rhamnosus, GG, ([employer] PROBIOTIC, LACTOBACILLUS,) CAPS Take 1 each by mouth daily. Patient not taking: Reported on 06/14/2014 04/03/14   Dorothy Spark, MD  simethicone (GAS-X) 80 MG chewable tablet Chew 1 tablet (80 mg total) by mouth every 6 (six) hours as needed for flatulence. Patient not taking: Reported on 06/14/2014 04/03/14   Dorothy Spark, MD    Physical Exam: Blood pressure 129/77, pulse 80, temperature 98.1 F (36.7 C), temperature source Oral, resp. rate 28, SpO2 100 %.    General: Well developed, well nourished, NAD  HEENT: OP clear, mucus  membranes moist  SKIN: warm, dry. No rashes.  Neuro: No focal deficits  Musculoskeletal: Muscle strength 5/5 all ext  Psychiatric: Mood and affect normal  Neck: No JVD, no carotid bruits, no thyromegaly, no lymphadenopathy.  Lungs:Clear bilaterally, no wheezes, rhonci, crackles  Cardiovascular: Regular rate and rhythm. No murmurs, gallops or rubs.  Abdomen:Soft. Bowel sounds present. Non-tender.  Extremities: No lower extremity edema. Pulses are 2 + in the bilateral DP/PT.  Labs:   Lab Results  Component Value Date   WBC 13.7* 06/14/2014   HGB 14.9 06/14/2014   HCT 44.0 06/14/2014   MCV 99.5 06/14/2014   PLT 229 06/14/2014     Recent Labs Lab 06/14/14 1042  NA 134*  K 4.4  CL 98  CO2 21  BUN 11  CREATININE 0.55  CALCIUM 9.4  GLUCOSE 152*    Chest x-ray:  Portable AP semi upright view at 1027 hrs. Stable cardiomegaly and mediastinal contours. Moderate to severe thoracolumbar scoliosis re - identified. Stable lung volumes with no pneumothorax, pulmonary edema, pleural effusion or acute pulmonary opacity identified. Chronic left lateral lower rib fractures. No acute displaced fracture identified. Degenerative changes at both shoulders.  IMPRESSION: No acute cardiopulmonary abnormality.  Chest CTA:  No evidence of pulmonary embolus.  Cardiomegaly, coronary artery disease.  Mild aneurysmal dilatation of the ascending thoracic aorta, 4.5 cm.  EKG: Atrial flutter, rate 127 bpm. Poor R wave progression precordial leads.   ASSESSMENT AND PLAN:   1. Atrial flutter with RVR: She now appears to be in sinus with PACs. Will get EKG in am. Continue beta blocker. Will increase dose of beta blocker. If she has recurrence of RVR tonight, would start IV Cardizem for rate control. Will admit to telemetry unit. Will continue ASA. She has had a long discussion in the past with Dr. Meda Coffee regarding anti-coagulation but this has not been started because the patient did not wish to be  anti-coagulated. She bleeds easily and does not want to consider anti-coagulation. She understands the risk of CVA if she does not start anti-coagulation while having paroxysms of atrial fib/flutter.   Darlina Guys, MD 06/14/2014, 5:17 PM

## 2014-06-14 NOTE — ED Notes (Signed)
Cardiology at bedside.

## 2014-06-14 NOTE — ED Notes (Signed)
Family at bedside. 

## 2014-06-14 NOTE — ED Notes (Signed)
Heart healthy dinner ordered 12/09

## 2014-06-14 NOTE — ED Provider Notes (Signed)
CSN: [id number]     Arrival date & time 06/14/14  1016 History   First MD Initiated Contact with Patient 06/14/14 1021     Chief Complaint  Patient presents with  . Tachycardia     (Consider location/radiation/quality/duration/timing/severity/associated sxs/prior Treatment) HPI   This is a 80 year old female with past medical history of atrial fibrillation, hypertension, polymyalgia rheumatica, previous stroke presents emergency Department with chief complaint of racing heart. The patient is not on any anticoagulants. She states that this morning she was doing household chores when she had sudden onset racing in her heart. She states that she felt short of breath and dizzy and had to sit down. She called her home health nurse who suggested she call 911. She denies any symptoms of infection such as URI symptoms, or urinary symptoms. She states that she had chest pain yesterday. She admits to hitting her chest on the railing of her balcony 2 days ago and has had pain since that time. It is worse with deep inhalation. She has pain to palpation of the left lateral chest wall. She denies hemoptysis, nausea, diaphoresis.   Past Medical History  Diagnosis Date  . Hypertension   . Arthritis   . Atrial fibrillation   . Back pain   . Colon cancer   . Polymyalgia rheumatica   . Hypoglycemia   . HTN (hypertension) 08/08/2013  . A-fib 08/08/2013  . Stroke    Past Surgical History  Procedure Laterality Date  . Colon surgery    . Cesarean section    . Bilateral oophorectomy    . Eye surgery     Family History  Problem Relation Age of Onset  . Aneurysm Mother   . Heart attack Father    History  Substance Use Topics  . Smoking status: Never Smoker   . Smokeless tobacco: Not on file  . Alcohol Use: No   OB History    No data available     Review of Systems Ten systems reviewed and are negative for acute change, except as noted in the HPI.     Allergies  Statins  Home Medications    Prior to Admission medications   Medication Sig Start Date End Date Taking? Authorizing Provider  acetaminophen (TYLENOL) 325 MG tablet Take 325 mg by mouth every morning.   Yes Historical Provider, MD  aspirin 81 MG chewable tablet Chew 1 tablet (81 mg total) by mouth daily. 08/19/13  Yes Daniel J Angiulli, PA-C  calcium-vitamin D (OSCAL WITH D) 500-200 MG-UNIT per tablet Take 1 tablet by mouth daily.   Yes Historical Provider, MD  Cholecalciferol (VITAMIN D) 2000 UNITS tablet Take 2,000 Units by mouth daily.   Yes Historical Provider, MD  diclofenac (FLECTOR) 1.3 % PTCH Place 0.5 patches onto the skin 2 (two) times daily.    Yes Historical Provider, MD  Docusate Sodium (COLACE PO) Take 2 tablets by mouth every morning.    Yes Historical Provider, MD  docusate sodium (COLACE) 100 MG capsule Take 200 mg by mouth every morning.   Yes Historical Provider, MD  isosorbide mononitrate (IMDUR) 30 MG 24 hr tablet Take 1 tablet (30 mg total) by mouth at bedtime. 02/02/14  Yes Dorothy Spark, MD  metoprolol succinate (TOPROL XL) 25 MG 24 hr tablet Take 1 tablet (25 mg total) by mouth every morning. 02/02/14  Yes Dorothy Spark, MD  omeprazole (PRILOSEC OTC) 20 MG tablet Take 20 mg by mouth every other day.   Yes Historical  Provider, MD  predniSONE (DELTASONE) 5 MG tablet Take 5 mg by mouth every morning.    Yes Historical Provider, MD  sennosides-docusate sodium (SENOKOT-S) 8.6-50 MG tablet Take 1 tablet by mouth at bedtime.   Yes Historical Provider, MD  sodium chloride (OCEAN) 0.65 % SOLN nasal spray Place 1 spray into both nostrils as needed for congestion.   Yes Historical Provider, MD  Lactobacillus Rhamnosus, GG, ([employer] PROBIOTIC, LACTOBACILLUS,) CAPS Take 1 each by mouth daily. Patient not taking: Reported on 06/14/2014 04/03/14   Dorothy Spark, MD  simethicone (GAS-X) 80 MG chewable tablet Chew 1 tablet (80 mg total) by mouth every 6 (six) hours as needed for flatulence. Patient not taking:  Reported on 06/14/2014 04/03/14   Dorothy Spark, MD   BP 118/74 mmHg  Pulse 125  Temp(Src) 98.1 F (36.7 C) (Oral)  Resp 23  SpO2 98% Physical Exam  Constitutional: She is oriented to person, place, and time. She appears well-developed and well-nourished. No distress.  HENT:  Head: Normocephalic and atraumatic.  Eyes: Conjunctivae are normal. No scleral icterus.  Neck: Normal range of motion.  Cardiovascular: Normal rate, regular rhythm, normal heart sounds and intact distal pulses.  Exam reveals no gallop and no friction rub.   No murmur heard. No peripheral edema  Pulmonary/Chest: Effort normal and breath sounds normal. No respiratory distress. She exhibits tenderness (TENDER TO PALPATION LEFT LATERAL CHEST WALL, NO CREPITUS OR STEP-OFF).  Abdominal: Soft. Bowel sounds are normal. She exhibits no distension and no mass. There is no tenderness. There is no guarding.  Neurological: She is alert and oriented to person, place, and time.  Skin: Skin is warm and dry. She is not diaphoretic.  Nursing note and vitals reviewed.   ED Course  Procedures (including critical care time) Labs Review Labs Reviewed  BASIC METABOLIC PANEL - Abnormal; Notable for the following:    Sodium 134 (*)    Glucose, Bld 152 (*)    GFR calc non Af Amer 78 (*)    All other components within normal limits  CBC - Abnormal; Notable for the following:    WBC 13.7 (*)    All other components within normal limits  URINALYSIS, ROUTINE W REFLEX MICROSCOPIC  I-STAT TROPOININ, ED    Imaging Review Dg Chest Port 1 View  06/14/2014   CLINICAL DATA:  80 year old female with acute onset left rib pain and shortness of Breath while lifting several days ago. Initial encounter.  EXAM: PORTABLE CHEST - 1 VIEW  COMPARISON:  05/08/2014 and earlier.  FINDINGS: Portable AP semi upright view at 1027 hrs. Stable cardiomegaly and mediastinal contours. Moderate to severe thoracolumbar scoliosis re - identified. Stable lung  volumes with no pneumothorax, pulmonary edema, pleural effusion or acute pulmonary opacity identified. Chronic left lateral lower rib fractures. No acute displaced fracture identified. Degenerative changes at both shoulders.  IMPRESSION: No acute cardiopulmonary abnormality.   Electronically Signed   By: Lars Pinks M.D.   On: 06/14/2014 10:57     EKG Interpretation   Date/Time:  Wednesday June 14 2014 10:23:51 EST Ventricular Rate:  127 PR Interval:  113 QRS Duration: 103 QT Interval:  349 QTC Calculation: 507 R Axis:   25 Text Interpretation:  Sinus tachycardia Anterior infarct, old Prolonged QT  interval Baseline wander in lead(s) V3 Since last tracing rate faster  Confirmed by YAO  MD, DAVID ([id number]) on 06/14/2014 11:39:25 AM      MDM   Final diagnoses:  Shortness of breath  Patient here with persistent tachycardia. She is not in atrial fibrillation today. Question if the patient has pulmonary embolus. She is slightly elevated glucose and elevated white blood cell count. Chest x-ray is unremarkable for acute abnormality. EKG shows sinus tachycardia and prolonged QT    3:21 PM Patient seen by Dr. Dionicia Abler. She is continuing to have tachycardia despite fluid.  Will be admitted by cardiology . + hyperglycemia Elevated white count.  negative CT angio Pt stable in ED with no significant deterioration in condition.   Margarita Mail, PA-C 06/14/14 [place] Yao, MD 06/15/14 [phone]

## 2014-06-14 NOTE — ED Notes (Signed)
Pt to department via EMS- pt reports that she had some left rib pain for the past couple of days. States that this morning she started to feel worse and was very anxious when EMS arrived. Pt reports she was taking out the trash and started having chest pain. Bp-139/88 Hr-128. 20g left forearm. 2lNC 96%.

## 2014-06-15 DIAGNOSIS — I4891 Unspecified atrial fibrillation: Secondary | ICD-10-CM | POA: Diagnosis not present

## 2014-06-15 DIAGNOSIS — R0781 Pleurodynia: Secondary | ICD-10-CM | POA: Diagnosis not present

## 2014-06-15 DIAGNOSIS — I10 Essential (primary) hypertension: Secondary | ICD-10-CM | POA: Diagnosis not present

## 2014-06-15 DIAGNOSIS — I712 Thoracic aortic aneurysm, without rupture: Secondary | ICD-10-CM | POA: Diagnosis not present

## 2014-06-15 LAB — BASIC METABOLIC PANEL
Anion gap: 10 (ref 5–15)
BUN: 10 mg/dL (ref 6–23)
CALCIUM: 9 mg/dL (ref 8.4–10.5)
CO2: 26 meq/L (ref 19–32)
CREATININE: 0.54 mg/dL (ref 0.50–1.10)
Chloride: 104 mEq/L (ref 96–112)
GFR calc Af Amer: >90 mL/min (ref >90)
GFR, EST NON AFRICAN AMERICAN: 78 mL/min — AB (ref >90)
Glucose, Bld: 84 mg/dL (ref 70–99)
Potassium: 4.8 mEq/L (ref 3.7–5.3)
Sodium: 140 mEq/L (ref 137–147)

## 2014-06-15 LAB — CBC
HCT: 42.2 % (ref 36.0–46.0)
Hemoglobin: 13.8 g/dL (ref 12.0–15.0)
MCH: 32.2 pg (ref 26.0–34.0)
MCHC: 32.7 g/dL (ref 30.0–36.0)
MCV: 98.6 fL (ref 78.0–100.0)
PLATELETS: 224 10*3/uL (ref 150–400)
RBC: 4.28 MIL/uL (ref 3.87–5.11)
RDW: 15.1 % (ref 11.5–15.5)
WBC: 6.6 10*3/uL (ref 4.0–10.5)

## 2014-06-15 MED ORDER — METOPROLOL TARTRATE 25 MG PO TABS: 25.0000 mg | ORAL_TABLET | Freq: Two times a day (BID) | ORAL | Status: DC

## 2014-06-15 MED ORDER — ACETAMINOPHEN 325 MG PO TABS: 650.0000 mg | ORAL_TABLET | ORAL | Status: DC | PRN

## 2014-06-15 MED ORDER — ACETAMINOPHEN 500 MG PO TABS: 500.0000 mg | ORAL_TABLET | Freq: Four times a day (QID) | ORAL | Status: DC | PRN

## 2014-06-15 NOTE — Discharge Instructions (Signed)
Atrial Fibrillation  Atrial fibrillation is a condition that causes your heart to beat irregularly. It may also cause your heart to beat faster than normal. Atrial fibrillation can prevent your heart from pumping blood normally. It increases your risk of stroke and heart problems.  HOME CARE  · Take medications as told by your doctor.  · Only take medications that your doctor says are safe. Some medications can make the condition worse or happen again.  · If blood thinners were prescribed by your doctor, take them exactly as told. Too much can cause bleeding. Too little and you will not have the needed protection against stroke and other problems.  · Perform blood tests at home if told by your doctor.  · Perform blood tests exactly as told by your doctor.  · Do not drink alcohol.  · Do not drink beverages with caffeine such as coffee, soda, and some teas.  · Maintain a healthy weight.  · Do not use diet pills unless your doctor says they are safe. They may make heart problems worse.  · Follow diet instructions as told by your doctor.  · Exercise regularly as told by your doctor.  · Keep all follow-up appointments.  GET HELP IF:  · You notice a change in the speed, rhythm, or strength of your heartbeat.  · You suddenly begin peeing (urinating) more often.  · You get tired more easily when moving or exercising.  GET HELP RIGHT AWAY IF:   · You have chest or belly (abdominal) pain.  · You feel sick to your stomach (nauseous).  · You are short of breath.  · You suddenly have swollen feet and ankles.  · You feel dizzy.  · You face, arms, or legs feel numb or weak.  · There is a change in your vision or speech.  MAKE SURE YOU:   · Understand these instructions.  · Will watch your condition.  · Will get help right away if you are not doing well or get worse.  Document Released: 04/01/2008 Document Revised: 11/07/2013 Document Reviewed: 08/03/2012  Exit[employer]® Patient Information ©2015 [employer]. This information is not  intended to replace advice given to you by your health care provider. Make sure you discuss any questions you have with your health care provider.

## 2014-06-15 NOTE — Progress Notes (Signed)
    Subjective:  No further palpitations. C/o pleuritic left sided CP since a 'muscle pull' a few weeks ago. No other c/o.   Objective:  Vital Signs in the last 24 hours: Temp:  [97.4 F (36.3 C)-98.4 F (36.9 C)] 98.2 F (36.8 C) (12/10 0442) Pulse Rate:  [87-131] 87 (12/10 0442) Resp:  [16-28] 20 (12/10 0442) BP: (101-166)/(61-110) 166/76 mmHg (12/10 0442) SpO2:  [95 %-100 %] 98 % (12/10 0442) Weight:  [111 lb 6.4 oz (50.531 kg)-112 lb 10.5 oz (51.1 kg)] 111 lb 6.4 oz (50.531 kg) (12/10 0442)  Intake/Output from previous day: 12/09 0701 - 12/10 0700 In: 75 [P.O.:236] Out: 700 [Urine:700]  Physical Exam: Pt is alert and oriented, delightful elderly woman in NAD HEENT: normal Neck: JVP - normal Lungs: CTA bilaterally CV: RRR without murmur or gallop Abd: soft, NT, Positive BS, no hepatomegaly Ext: no C/C/E, distal pulses intact and equal Skin: warm/dry no rash   Lab Results:  Recent Labs  06/14/14 1042 06/15/14 0546  WBC 13.7* 6.6  HGB 14.9 13.8  PLT 229 224    Recent Labs  06/14/14 1042 06/15/14 0546  NA 134* 140  K 4.4 4.8  CL 98 104  CO2 21 26  GLUCOSE 152* 84  BUN 11 10  CREATININE 0.55 0.54   No results for input(s): TROPONINI in the last 72 hours.  Invalid input(s): CK, MB   Tele: Sinus rhythm, PAC's  Assessment/Plan:  1. PAF - on beta-blocker/ASA. No AC per patient's wishes - appropriate at 42. Favor short-acting metoprolol so that she can use it as needed when she has tachypalpitations. Would use metoprolol 25 mg BID and additional 25 mg as needed for palps (no more than once daily for prn use).   2. Chest pain, pleuritic. Related to rib/muscle pull. Use Tylenol ES 500 mg - every 6 hours as needed. Try to minimize use.   3. Thoracic aortic aneurysm - 4.5 cm. Conservative Rx considering advanced age - no f/u imaging needed.  F/U: Dr Meda Coffee 2 weeks.  Sherren Mocha, M.D. 06/15/2014, 8:34 AM

## 2014-06-15 NOTE — Progress Notes (Signed)
06/15/2014 7:31 AM The EKG machine kept saying muscle tremors with the patient even when she was completely still.  We printed off a sheet anyway because that was as good as we were going to get with it. Krystal Hensley

## 2014-06-15 NOTE — Plan of Care (Signed)
Problem: Phase I Progression Outcomes Goal: Heart rate or rhythm control medication Outcome: Progressing

## 2014-06-15 NOTE — Plan of Care (Signed)
Problem: Phase I Progression Outcomes Goal: Hemodynamically stable Outcome: Completed/Met Date Met:  06/15/14

## 2014-06-15 NOTE — Plan of Care (Signed)
Problem: Phase I Progression Outcomes Goal: Anticoagulation Therapy per MD order Outcome: Progressing

## 2014-06-15 NOTE — Progress Notes (Signed)
UR completed 

## 2014-06-15 NOTE — Progress Notes (Signed)
Pt given discharge instructions, medication lists, follow up appointments, and when to call the doctor.  Pt verbalizes understanding. Sherlock Nancarrow McClintock, RN  

## 2014-06-15 NOTE — Plan of Care (Signed)
Problem: Phase I Progression Outcomes Goal: Ventricular heart rate < 120/min Outcome: Completed/Met Date Met:  06/15/14

## 2014-06-15 NOTE — Discharge Summary (Signed)
Patient ID: Krystal Hensley,  MRN: [id number], DOB/AGE: 1918-06-07 80 y.o.  Admit date: 06/14/2014 Discharge date: 06/15/2014  Primary Care Provider: [name], MD Primary Cardiologist: Dr Meda Coffee  Discharge Diagnoses Principal Problem:   Atrial flutter with rapid ventricular response Active Problems:   PAF (paroxysmal atrial fibrillation)   HTN (hypertension)   CVA (cerebral infarction)-by MRI Feb 2015   PMR (polymyalgia rheumatica)    Hospital Course:  80 yo female with history of HTN, paroxysmal atrial fib, PMR, HTN, CVA by MRI Feb 2015 who was admitted 06/14/14 with palpitations. In ED EKG showed A flutter with RVR. She latter converted spontaneously to NSR. The pt declined anticoagulation and will be treated with ASA. Her Toprol was changed to Metoprolol Tartrate. She will f/u with Dr Meda Coffee as an OP.   Discharge Vitals:  Blood pressure 166/76, pulse 87, temperature 98.2 F (36.8 C), temperature source Oral, resp. rate 20, height 5' (1.524 m), weight 111 lb 6.4 oz (50.531 kg), SpO2 98 %.    Labs: Results for orders placed or performed during the hospital encounter of 06/14/14 (from the past 24 hour(s))  Basic metabolic panel     Status: Abnormal   Collection Time: 06/14/14 10:42 AM  Result Value Ref Range   Sodium 134 (L) 137 - 147 mEq/L   Potassium 4.4 3.7 - 5.3 mEq/L   Chloride 98 96 - 112 mEq/L   CO2 21 19 - 32 mEq/L   Glucose, Bld 152 (H) 70 - 99 mg/dL   BUN 11 6 - 23 mg/dL   Creatinine, Ser 0.55 0.50 - 1.10 mg/dL   Calcium 9.4 8.4 - 10.5 mg/dL   GFR calc non Af Amer 78 (L) >90 mL/min   GFR calc Af Amer >90 >90 mL/min   Anion gap 15 5 - 15  CBC     Status: Abnormal   Collection Time: 06/14/14 10:42 AM  Result Value Ref Range   WBC 13.7 (H) 4.0 - 10.5 K/uL   RBC 4.42 3.87 - 5.11 MIL/uL   Hemoglobin 14.9 12.0 - 15.0 g/dL   HCT 44.0 36.0 - 46.0 %   MCV 99.5 78.0 - 100.0 fL   MCH 33.7 26.0 - 34.0 pg   MCHC 33.9 30.0 - 36.0 g/dL   RDW 14.7  11.5 - 15.5 %   Platelets 229 150 - 400 K/uL  I-stat troponin, ED     Status: None   Collection Time: 06/14/14 11:00 AM  Result Value Ref Range   Troponin i, poc 0.02 0.00 - 0.08 ng/mL   Comment 3          Urinalysis, Routine w reflex microscopic     Status: None   Collection Time: 06/14/14 11:25 AM  Result Value Ref Range   Color, Urine YELLOW YELLOW   APPearance CLEAR CLEAR   Specific Gravity, Urine 1.008 1.005 - 1.030   pH 7.0 5.0 - 8.0   Glucose, UA NEGATIVE NEGATIVE mg/dL   Hgb urine dipstick NEGATIVE NEGATIVE   Bilirubin Urine NEGATIVE NEGATIVE   Ketones, ur NEGATIVE NEGATIVE mg/dL   Protein, ur NEGATIVE NEGATIVE mg/dL   Urobilinogen, UA 0.2 0.0 - 1.0 mg/dL   Nitrite NEGATIVE NEGATIVE   Leukocytes, UA NEGATIVE NEGATIVE  Basic metabolic panel     Status: Abnormal   Collection Time: 06/15/14  5:46 AM  Result Value Ref Range   Sodium 140 137 - 147 mEq/L   Potassium 4.8 3.7 - 5.3 mEq/L   Chloride 104  96 - 112 mEq/L   CO2 26 19 - 32 mEq/L   Glucose, Bld 84 70 - 99 mg/dL   BUN 10 6 - 23 mg/dL   Creatinine, Ser 0.54 0.50 - 1.10 mg/dL   Calcium 9.0 8.4 - 10.5 mg/dL   GFR calc non Af Amer 78 (L) >90 mL/min   GFR calc Af Amer >90 >90 mL/min   Anion gap 10 5 - 15  CBC     Status: None   Collection Time: 06/15/14  5:46 AM  Result Value Ref Range   WBC 6.6 4.0 - 10.5 K/uL   RBC 4.28 3.87 - 5.11 MIL/uL   Hemoglobin 13.8 12.0 - 15.0 g/dL   HCT 42.2 36.0 - 46.0 %   MCV 98.6 78.0 - 100.0 fL   MCH 32.2 26.0 - 34.0 pg   MCHC 32.7 30.0 - 36.0 g/dL   RDW 15.1 11.5 - 15.5 %   Platelets 224 150 - 400 K/uL    Disposition:  Follow-up Information    Follow up with Dorothy Spark, MD.   Specialty:  Cardiology   Why:  office will call you   Contact information:   [address] [place] [address] [phone]       Discharge Medications:    Medication List    STOP taking these medications        calcium-vitamin D 500-200 MG-UNIT per tablet    Commonly known as:  OSCAL WITH D     [employer] PROBIOTIC (LACTOBACILLUS) Caps     metoprolol succinate 25 MG 24 hr tablet  Commonly known as:  TOPROL XL     simethicone 80 MG chewable tablet  Commonly known as:  GAS-X      TAKE these medications        acetaminophen 325 MG tablet  Commonly known as:  TYLENOL  Take 2 tablets (650 mg total) by mouth every 4 (four) hours as needed for headache or mild pain.     aspirin 81 MG chewable tablet  Chew 1 tablet (81 mg total) by mouth daily.     diclofenac 1.3 % Ptch  Commonly known as:  FLECTOR  Place 0.5 patches onto the skin 2 (two) times daily.     docusate sodium 100 MG capsule  Commonly known as:  COLACE  Take 200 mg by mouth every morning.     isosorbide mononitrate 30 MG 24 hr tablet  Commonly known as:  IMDUR  Take 1 tablet (30 mg total) by mouth at bedtime.     metoprolol tartrate 25 MG tablet  Commonly known as:  LOPRESSOR  Take 1 tablet (25 mg total) by mouth 2 (two) times daily.     omeprazole 20 MG tablet  Commonly known as:  PRILOSEC OTC  Take 20 mg by mouth every other day.     predniSONE 5 MG tablet  Commonly known as:  DELTASONE  Take 5 mg by mouth every morning.     sennosides-docusate sodium 8.6-50 MG tablet  Commonly known as:  SENOKOT-S  Take 1 tablet by mouth at bedtime.     sodium chloride 0.65 % Soln nasal spray  Commonly known as:  OCEAN  Place 1 spray into both nostrils as needed for congestion.     Vitamin D 2000 UNITS tablet  Take 2,000 Units by mouth daily.         Duration of Discharge Encounter: Greater than 30 minutes including physician time.  Signed, Lurena Joiner  Diella Gillingham PA-C 06/15/2014 9:49 AM

## 2014-06-20 ENCOUNTER — Telehealth: Payer: Self-pay | Admitting: *Deleted

## 2014-06-21 ENCOUNTER — Telehealth: Payer: Self-pay | Admitting: Cardiology

## 2014-06-21 NOTE — Telephone Encounter (Signed)
Called patient back about her side effects. Patient stated, "They changed my medications when I was discharged from the hospital." Patient feels that the change from metoprolol succinate 25 mg once daily to metoprolol tartrate 25 mg twice daily has caused her to have stomach upset and diarrhea. Talked to Barbra Sarks PA, since patient is coming in next week 06/28/14 to see Dr. Meda Coffee, she thinks the patient can go back on the metoprolol succinate extended release and stop the metoprolol tartrate until then. Patient informed of this change and was agreeable. No other questions at this time.

## 2014-06-21 NOTE — Telephone Encounter (Signed)
New Message  Pt is having problems (diarrhea, SoB off/on, fatigue) since taking metoprolol tartarate. Started after being d/c from hosp last week. She request to speak with a Rn. Please call back and discuss.

## 2014-06-28 ENCOUNTER — Ambulatory Visit (INDEPENDENT_AMBULATORY_CARE_PROVIDER_SITE_OTHER): Payer: Medicare Other | Admitting: Cardiology

## 2014-06-28 ENCOUNTER — Encounter: Payer: Self-pay | Admitting: Cardiology

## 2014-06-28 VITALS — BP 160/88 | HR 79 | Ht 62.0 in | Wt 114.1 lb

## 2014-06-28 DIAGNOSIS — I10 Essential (primary) hypertension: Secondary | ICD-10-CM

## 2014-06-28 DIAGNOSIS — R072 Precordial pain: Secondary | ICD-10-CM

## 2014-06-28 DIAGNOSIS — I4892 Unspecified atrial flutter: Secondary | ICD-10-CM

## 2014-06-28 DIAGNOSIS — R600 Localized edema: Secondary | ICD-10-CM

## 2014-06-28 DIAGNOSIS — I639 Cerebral infarction, unspecified: Secondary | ICD-10-CM

## 2014-06-28 MED ORDER — HYDROCHLOROTHIAZIDE 25 MG PO TABS: 25.0000 mg | ORAL_TABLET | ORAL | Status: DC | PRN

## 2014-06-28 MED ORDER — IBUPROFEN 200 MG PO CAPS: 200.0000 mg | ORAL_CAPSULE | Freq: Two times a day (BID) | ORAL | Status: DC

## 2014-06-28 NOTE — Progress Notes (Signed)
Patient ID: Krystal Hensley, female   DOB: [birth year], 80 y.o.   MRN: [id number] Patient ID: Krystal Hensley, female   DOB: July 18, 1917, 80 y.o.   MRN: [id number]    Patient Name: Krystal Hensley Date of Encounter: 06/28/2014  Primary Care Provider:  [name], MD Primary Cardiologist:  Dorothy Spark  Problem List   Past Medical History  Diagnosis Date  . Hypertension   . Arthritis   . Atrial fibrillation   . Back pain   . Colon cancer   . Polymyalgia rheumatica   . Hypoglycemia   . HTN (hypertension) 08/08/2013  . A-fib 08/08/2013  . Stroke    Past Surgical History  Procedure Laterality Date  . Colon surgery    . Cesarean section    . Bilateral oophorectomy    . Eye surgery     Allergies  Allergies  Allergen Reactions  . Statins Other (See Comments)    myalgia    HPI  The patient is a 80 year old woman with a past medical history significant for hypertension and paroxysmal atrial fibrillation who has been experiencing intermittent chest pressure since 2:00 this morning. She said she normally wakes up at approximately 2 AM and does not sleep much. She was not awoken by the chest pressure. However, she described it as "something sitting on my chest". She took a chewable baby aspirin and went to sleep. By the time she woke later this morning, the chest pressure had resolved but she felt significantly fatigued. She called her daughter and went to an urgent care. From there, 911 was called. She denied associated shortness of breath, dizziness, and palpitations. She sleeps with 2 pillows for comfort and to help her breathe better, but has done so for the past 10 years. She said she sustained a stroke this past February and takes aspirin only. She does not believe anticoagulants were ever discussed. She said she had a cardiac catheterization approximately 25 years ago that was normal. She said it was at that time that an abnormal heart rhythm was discovered. She  denies leg swelling and paroxysmal nocturnal dyspnea.  Last fall, she was walking to her front door too quickly, missed a step and fell and fractured some ribs.  She has been given a nitro patch and while the chest pressure has diminished, it has not completely resolved.  1. Chest pain: No further chest pain. Troponins normal, and has ruled out for an acute coronary syndrome. Initial symptoms were concerning for a coronary etiology. ECG demonstrated sinus rhythm with nonspecific ST changes with possible anteroseptal infarct, unchanged from ECG in 08/2013. Agree with continuation of ASA, beta blocker, and prn nitro at discharge. BNP 1065 on 7/11 in setting of chest pressure and initial BP of 172/102. BNP elevation can be due to elevated end-diastolic filling pressures with severe HTN, as well as ischemia. No evidence of heart failure. Echo in 08/2013 demonstrated vigorous LV systolic function, EF 33-82%, with mild LVH, grade I diastolic dysfunction, and mild mitral regurgitation.  Would recommend consideration for outpatient stress testing. She also has presumably new rib fractures not seen on prior chest films, which is worrisome for pathologic fractures, as she denies h/o recent falls.   02/02/2014 - the patient states that since the discharge she hasn't experienced any chest pressure. However she has noticed in the last year that she is more fatigued and weak, specifically her legs on are very weak while she's walking she has been feeling mildly short of  breath but states that when she got nitroglycerin patch in the hospital she felt like her whole new person. She denies any palpitations or syncope.  04/03/2014 - she reports improvement of fatigue since we decreased Toprol dose. No more chest pain. Her current problem constipation and diarrhea after OTC stool softeners.  06/28/2014 - the patient was just discharged from [facility] - 80 yo female with history of HTN, paroxysmal atrial fib, PMR, HTN, CVA by MRI  Feb 2015 who was admitted 06/14/14 with palpitations. In ED EKG showed A flutter with RVR. She latter converted spontaneously to NSR. The pt declined anticoagulation and will be treated with ASA. Her Toprol was changed to Metoprolol Tartrate. She will f/u with Dr Meda Coffee as an OP.  Today she states that she feels best in a long time, her complains are left LE swelling on and off for months, mild, resolves with elevation and epigastric pain with SOB that feels like swelling as well.   Home Medications  Prior to Admission medications   Medication Sig Start Date End Date Taking? Authorizing Provider  aspirin 81 MG chewable tablet Chew 1 tablet (81 mg total) by mouth daily. 08/19/13  Yes Daniel J Angiulli, PA-C  calcium-vitamin D (OSCAL WITH D) 500-200 MG-UNIT per tablet Take 1 tablet by mouth daily.   Yes Historical Provider, MD  lidocaine (LIDODERM) 5 % Place 1 patch onto the skin daily.  09/13/13  Yes Historical Provider, MD  metoprolol succinate (TOPROL-XL) 50 MG 24 hr tablet Take 25-50 mg by mouth 2 (two) times daily. Take 1 tablet in the AM and 0.5 tablet in PM   Yes Historical Provider, MD  Multiple Vitamins-Minerals (OCUVITE-LUTEIN PO) Take 1 tablet by mouth daily.   Yes Historical Provider, MD  omeprazole (PRILOSEC OTC) 20 MG tablet Take 20 mg by mouth every other day.   Yes Historical Provider, MD  predniSONE (DELTASONE) 5 MG tablet Take 5 mg by mouth daily.   Yes Historical Provider, MD    Family History  Family History  Problem Relation Age of Onset  . Aneurysm Mother   . Heart attack Father     Social History  History   Social History  . Marital Status: Widowed    Spouse Name: N/A    Number of Children: N/A  . Years of Education: N/A   Occupational History  . Not on file.   Social History Main Topics  . Smoking status: Never Smoker   . Smokeless tobacco: Not on file  . Alcohol Use: No  . Drug Use: No  . Sexual Activity: No   Other Topics Concern  . Not on file    Social History Narrative     Review of Systems, as per HPI, otherwise negative General:  No chills, fever, night sweats or weight changes.  Cardiovascular:  No chest pain, dyspnea on exertion, edema, orthopnea, palpitations, paroxysmal nocturnal dyspnea. Dermatological: No rash, lesions/masses Respiratory: No cough, dyspnea Urologic: No hematuria, dysuria Abdominal:   No nausea, vomiting, diarrhea, bright red blood per rectum, melena, or hematemesis Neurologic:  No visual changes, wkns, changes in mental status. All other systems reviewed and are otherwise negative except as noted above.  Physical Exam  There were no vitals taken for this visit.  General: Pleasant, NAD Psych: Normal affect. Neuro: Alert and oriented X 3. Moves all extremities spontaneously. HEENT: Normal  Neck: Supple without bruits or JVD. Lungs:  Resp regular and unlabored, CTA. Heart: RRR no s3, s4, or murmurs. Abdomen: Soft, non-tender,  non-distended, BS + x 4.  Extremities: No clubbing, cyanosis or edema. DP/PT/Radials 1+ and equal bilaterally.  Labs:  No results for input(s): CKTOTAL, CKMB, TROPONINI in the last 72 hours. Lab Results  Component Value Date   WBC 6.6 06/15/2014   HGB 13.8 06/15/2014   HCT 42.2 06/15/2014   MCV 98.6 06/15/2014   PLT 224 06/15/2014    No results found for: DDIMER Invalid input(s): POCBNP    Component Value Date/Time   NA 140 06/15/2014 0546   K 4.8 06/15/2014 0546   CL 104 06/15/2014 0546   CO2 26 06/15/2014 0546   GLUCOSE 84 06/15/2014 0546   BUN 10 06/15/2014 0546   CREATININE 0.54 06/15/2014 0546   CALCIUM 9.0 06/15/2014 0546   PROT 6.0 08/11/2013 0720   ALBUMIN 3.0* 08/11/2013 0720   AST 27 08/11/2013 0720   ALT 15 08/11/2013 0720   ALKPHOS 79 08/11/2013 0720   BILITOT 0.7 08/11/2013 0720   GFRNONAA 78* 06/15/2014 0546   GFRAA >90 06/15/2014 0546   Lab Results  Component Value Date   CHOL 216* 08/09/2013   HDL 73 08/09/2013   LDLCALC 120*  08/09/2013   TRIG 116 08/09/2013    Accessory Clinical Findings  Echocardiogram - 08/08/13 Left ventricle: The cavity size was normal. Wall thickness was increased in a pattern of mild LVH. Systolic function was vigorous. The estimated ejection fraction was in the range of 65% to 70%. Doppler parameters are consistent with abnormal left ventricular relaxation (grade 1 diastolic dysfunction). - Mitral valve: Mild regurgitation. - Pulmonary arteries: PA peak pressure: 97mm Hg (S).  ECG - normal sinus rhythm, 78 beats per minute, incomplete left bundle branch block, PVCs   Assessment & Plan  1. Paroxysmal atrial flutter - continue same medication with Toprol 25 mg daily and Lopressor 25 mg as needed for palpitations. No anticoagulation because of risk of fall.  2. Lower extremity edema - bilateral occasional, will prescribe hydrochlorothiazide 25 mg daily as needed.  3. Moderate pulmonary hypertension - adding long acting nitrate might help with shortness of breath.  4. Chest pain: This is very atypical chest pain and appears like a pulled muscle. Patient is advised to use ibuprofen 400 mg for next 5 days and then stop.   Follow up in 3 months   Dorothy Spark, MD, [facility] 06/28/2014, 3:13 PM

## 2014-06-28 NOTE — Patient Instructions (Signed)
Your physician has recommended you make the following change in your medication:   START TAKING HYDROCHLOROTHIAZIDE 25 MG ONCE DAILY AS NEEDED FOR SWELLING     TAKE IBUPROFEN 200 MG TWICE DAILY FOR 5 DAYS ONLY-   TOTAL AMOUNT TAKEN IN ONE DAY IS 400 MG      Your physician recommends that you schedule a follow-up appointment in: MARCH 2016 WITH DR Meda Coffee

## 2014-07-05 ENCOUNTER — Encounter: Payer: Medicare Other | Admitting: Surgery

## 2014-07-10 ENCOUNTER — Other Ambulatory Visit: Payer: Medicare Other

## 2014-07-11 ENCOUNTER — Telehealth: Payer: Self-pay | Admitting: Cardiology

## 2014-07-11 MED ORDER — METOPROLOL SUCCINATE ER 25 MG PO TB24: 25.0000 mg | ORAL_TABLET | Freq: Every day | ORAL | Status: DC

## 2014-07-11 NOTE — Telephone Encounter (Signed)
New Msg        Pt needs a new prescription for Metoprolol 25 mg long release.   Pharmacy states prescription has been canceled and quick release formula is being filled instead.  Pt uses [facility] on [address]. Pt daughter Krystal Hensley may be reached at [phone].

## 2014-07-11 NOTE — Telephone Encounter (Signed)
Contacted the pts daughter to inform her that the Toprol XL 25 mg po once daily was filled for a 90 day supply to the pts pharmacy of choice.  Informed the daughter that this med was prescribed by Dr Meda Coffee for the pt to take, instead of the tartrate.  Daughter verbalized understanding and gracious for all the assistance provided.

## 2014-07-19 ENCOUNTER — Ambulatory Visit: Payer: Medicare Other | Admitting: Podiatry

## 2014-07-24 ENCOUNTER — Inpatient Hospital Stay (HOSPITAL_COMMUNITY): Payer: Medicare Other

## 2014-07-24 ENCOUNTER — Encounter (HOSPITAL_COMMUNITY): Payer: Self-pay | Admitting: *Deleted

## 2014-07-24 ENCOUNTER — Emergency Department (HOSPITAL_COMMUNITY): Payer: Medicare Other

## 2014-07-24 ENCOUNTER — Telehealth: Payer: Self-pay | Admitting: Cardiology

## 2014-07-24 ENCOUNTER — Inpatient Hospital Stay (HOSPITAL_COMMUNITY)
Admission: EM | Admit: 2014-07-24 | Discharge: 2014-07-26 | DRG: 308 | Disposition: A | Discharge status: Home Health | Payer: Medicare Other | Attending: Internal Medicine | Admitting: Internal Medicine

## 2014-07-24 DIAGNOSIS — M353 Polymyalgia rheumatica: Secondary | ICD-10-CM | POA: Diagnosis present

## 2014-07-24 DIAGNOSIS — R0600 Dyspnea, unspecified: Secondary | ICD-10-CM | POA: Diagnosis not present

## 2014-07-24 DIAGNOSIS — I272 Other secondary pulmonary hypertension: Secondary | ICD-10-CM | POA: Diagnosis present

## 2014-07-24 DIAGNOSIS — I4892 Unspecified atrial flutter: Secondary | ICD-10-CM | POA: Diagnosis present

## 2014-07-24 DIAGNOSIS — Z9181 History of falling: Secondary | ICD-10-CM

## 2014-07-24 DIAGNOSIS — Z8673 Personal history of transient ischemic attack (TIA), and cerebral infarction without residual deficits: Secondary | ICD-10-CM | POA: Diagnosis not present

## 2014-07-24 DIAGNOSIS — I270 Primary pulmonary hypertension: Secondary | ICD-10-CM

## 2014-07-24 DIAGNOSIS — I480 Paroxysmal atrial fibrillation: Principal | ICD-10-CM | POA: Diagnosis present

## 2014-07-24 DIAGNOSIS — Z889 Allergy status to unspecified drugs, medicaments and biological substances status: Secondary | ICD-10-CM

## 2014-07-24 DIAGNOSIS — R1011 Right upper quadrant pain: Secondary | ICD-10-CM

## 2014-07-24 DIAGNOSIS — K5900 Constipation, unspecified: Secondary | ICD-10-CM | POA: Diagnosis present

## 2014-07-24 DIAGNOSIS — Z8249 Family history of ischemic heart disease and other diseases of the circulatory system: Secondary | ICD-10-CM | POA: Diagnosis not present

## 2014-07-24 DIAGNOSIS — I10 Essential (primary) hypertension: Secondary | ICD-10-CM | POA: Diagnosis present

## 2014-07-24 DIAGNOSIS — M1990 Unspecified osteoarthritis, unspecified site: Secondary | ICD-10-CM | POA: Diagnosis present

## 2014-07-24 DIAGNOSIS — I5033 Acute on chronic diastolic (congestive) heart failure: Secondary | ICD-10-CM | POA: Diagnosis present

## 2014-07-24 DIAGNOSIS — Z791 Long term (current) use of non-steroidal anti-inflammatories (NSAID): Secondary | ICD-10-CM | POA: Diagnosis not present

## 2014-07-24 DIAGNOSIS — I5032 Chronic diastolic (congestive) heart failure: Secondary | ICD-10-CM

## 2014-07-24 DIAGNOSIS — Z7982 Long term (current) use of aspirin: Secondary | ICD-10-CM

## 2014-07-24 DIAGNOSIS — R109 Unspecified abdominal pain: Secondary | ICD-10-CM

## 2014-07-24 DIAGNOSIS — I5031 Acute diastolic (congestive) heart failure: Secondary | ICD-10-CM

## 2014-07-24 DIAGNOSIS — I4891 Unspecified atrial fibrillation: Secondary | ICD-10-CM | POA: Diagnosis present

## 2014-07-24 DIAGNOSIS — I639 Cerebral infarction, unspecified: Secondary | ICD-10-CM | POA: Diagnosis present

## 2014-07-24 HISTORY — DX: Right upper quadrant pain: R10.11

## 2014-07-24 HISTORY — DX: Dyspnea, unspecified: R06.00

## 2014-07-24 LAB — CBC WITH DIFFERENTIAL/PLATELET
BASOS ABS: 0 10*3/uL (ref 0.0–0.1)
Basophils Relative: 0 % (ref 0–1)
EOS ABS: 0 10*3/uL (ref 0.0–0.7)
EOS PCT: 0 % (ref 0–5)
HCT: 42.4 % (ref 36.0–46.0)
HEMOGLOBIN: 14.2 g/dL (ref 12.0–15.0)
Lymphocytes Relative: 5 % — ABNORMAL LOW (ref 12–46)
Lymphs Abs: 0.5 10*3/uL — ABNORMAL LOW (ref 0.7–4.0)
MCH: 32.9 pg (ref 26.0–34.0)
MCHC: 33.5 g/dL (ref 30.0–36.0)
MCV: 98.4 fL (ref 78.0–100.0)
MONO ABS: 0.5 10*3/uL (ref 0.1–1.0)
Monocytes Relative: 4 % (ref 3–12)
NEUTROS ABS: 9.9 10*3/uL — AB (ref 1.7–7.7)
Neutrophils Relative %: 91 % — ABNORMAL HIGH (ref 43–77)
Platelets: 223 10*3/uL (ref 150–400)
RBC: 4.31 MIL/uL (ref 3.87–5.11)
RDW: 14.3 % (ref 11.5–15.5)
WBC: 11 10*3/uL — AB (ref 4.0–10.5)

## 2014-07-24 LAB — TROPONIN I: Troponin I: <0.03 ng/mL (ref <0.031)

## 2014-07-24 LAB — URINALYSIS, ROUTINE W REFLEX MICROSCOPIC
Bilirubin Urine: NEGATIVE
Glucose, UA: NEGATIVE mg/dL
Hgb urine dipstick: NEGATIVE
KETONES UR: NEGATIVE mg/dL
LEUKOCYTES UA: NEGATIVE
Nitrite: NEGATIVE
PROTEIN: NEGATIVE mg/dL
Specific Gravity, Urine: 1.01 (ref 1.005–1.030)
Urobilinogen, UA: 0.2 mg/dL (ref 0.0–1.0)
pH: 7.5 (ref 5.0–8.0)

## 2014-07-24 LAB — COMPREHENSIVE METABOLIC PANEL
ALBUMIN: 3.5 g/dL (ref 3.5–5.2)
ALT: 89 U/L — ABNORMAL HIGH (ref 0–35)
AST: 112 U/L — ABNORMAL HIGH (ref 0–37)
Alkaline Phosphatase: 113 U/L (ref 39–117)
Anion gap: 10 (ref 5–15)
BUN: 10 mg/dL (ref 6–23)
CHLORIDE: 105 meq/L (ref 96–112)
CO2: 23 mmol/L (ref 19–32)
Calcium: 9 mg/dL (ref 8.4–10.5)
Creatinine, Ser: 0.71 mg/dL (ref 0.50–1.10)
GFR calc Af Amer: 83 mL/min — ABNORMAL LOW (ref >90)
GFR calc non Af Amer: 72 mL/min — ABNORMAL LOW (ref >90)
Glucose, Bld: 124 mg/dL — ABNORMAL HIGH (ref 70–99)
Potassium: 4.7 mmol/L (ref 3.5–5.1)
Sodium: 138 mmol/L (ref 135–145)
TOTAL PROTEIN: 6 g/dL (ref 6.0–8.3)
Total Bilirubin: 0.8 mg/dL (ref 0.3–1.2)

## 2014-07-24 LAB — BRAIN NATRIURETIC PEPTIDE: B NATRIURETIC PEPTIDE 5: 581.3 pg/mL — AB (ref 0.0–100.0)

## 2014-07-24 LAB — TSH: TSH: 1.076 u[IU]/mL (ref 0.350–4.500)

## 2014-07-24 LAB — LIPASE, BLOOD: LIPASE: 28 U/L (ref 11–59)

## 2014-07-24 MED ORDER — SODIUM CHLORIDE 0.9 % IV SOLN
INTRAVENOUS | Status: AC
  Administered 2014-07-24: 17:00:00 via INTRAVENOUS

## 2014-07-24 MED ORDER — DOCUSATE SODIUM 100 MG PO CAPS: 200.0000 mg | ORAL_CAPSULE | Freq: Every day | ORAL | Status: DC | PRN

## 2014-07-24 MED ORDER — ALUM & MAG HYDROXIDE-SIMETH 200-200-20 MG/5ML PO SUSP: 30.0000 mL | Freq: Four times a day (QID) | ORAL | Status: DC | PRN

## 2014-07-24 MED ORDER — ACETAMINOPHEN 325 MG PO TABS
650.0000 mg | ORAL_TABLET | Freq: Four times a day (QID) | ORAL | Status: DC | PRN
  Administered 2014-07-25 (×2): 650 mg via ORAL
  Filled 2014-07-24 (×2): qty 2

## 2014-07-24 MED ORDER — ASPIRIN 81 MG PO CHEW
81.0000 mg | CHEWABLE_TABLET | Freq: Every day | ORAL | Status: DC
  Administered 2014-07-25 – 2014-07-26 (×2): 81 mg via ORAL
  Filled 2014-07-24 (×2): qty 1

## 2014-07-24 MED ORDER — ENOXAPARIN SODIUM 30 MG/0.3ML ~~LOC~~ SOLN
30.0000 mg | SUBCUTANEOUS | Status: DC
  Administered 2014-07-24 – 2014-07-25 (×2): 30 mg via SUBCUTANEOUS
  Filled 2014-07-24 (×2): qty 0.3

## 2014-07-24 MED ORDER — DILTIAZEM LOAD VIA INFUSION
10.0000 mg | Freq: Once | INTRAVENOUS | Status: AC
  Administered 2014-07-24: 10 mg via INTRAVENOUS

## 2014-07-24 MED ORDER — PANTOPRAZOLE SODIUM 40 MG PO TBEC
40.0000 mg | DELAYED_RELEASE_TABLET | Freq: Every day | ORAL | Status: DC
  Administered 2014-07-25 – 2014-07-26 (×2): 40 mg via ORAL
  Filled 2014-07-24 (×2): qty 1

## 2014-07-24 MED ORDER — ONDANSETRON HCL 4 MG/2ML IJ SOLN: 4.0000 mg | Freq: Three times a day (TID) | INTRAMUSCULAR | Status: DC | PRN

## 2014-07-24 MED ORDER — ENOXAPARIN SODIUM 40 MG/0.4ML ~~LOC~~ SOLN: 40.0000 mg | SUBCUTANEOUS | Status: DC

## 2014-07-24 MED ORDER — SODIUM CHLORIDE 0.9 % IJ SOLN
3.0000 mL | Freq: Two times a day (BID) | INTRAMUSCULAR | Status: DC
  Administered 2014-07-24 – 2014-07-25 (×3): 3 mL via INTRAVENOUS

## 2014-07-24 MED ORDER — METOPROLOL SUCCINATE ER 25 MG PO TB24
25.0000 mg | ORAL_TABLET | Freq: Every day | ORAL | Status: DC
  Filled 2014-07-24: qty 1

## 2014-07-24 MED ORDER — HYDROCODONE-ACETAMINOPHEN 5-325 MG PO TABS: 1.0000 | ORAL_TABLET | ORAL | Status: DC | PRN

## 2014-07-24 MED ORDER — SENNA-DOCUSATE SODIUM 8.6-50 MG PO TABS
1.0000 | ORAL_TABLET | Freq: Every day | ORAL | Status: DC
  Filled 2014-07-24 (×3): qty 1

## 2014-07-24 MED ORDER — PREDNISONE 5 MG PO TABS
5.0000 mg | ORAL_TABLET | Freq: Every morning | ORAL | Status: DC
  Administered 2014-07-25 – 2014-07-26 (×2): 5 mg via ORAL
  Filled 2014-07-24 (×2): qty 1

## 2014-07-24 MED ORDER — OMEPRAZOLE MAGNESIUM 20 MG PO TBEC: 40.0000 mg | DELAYED_RELEASE_TABLET | Freq: Every day | ORAL | Status: DC

## 2014-07-24 MED ORDER — DILTIAZEM HCL 30 MG PO TABS
30.0000 mg | ORAL_TABLET | Freq: Four times a day (QID) | ORAL | Status: DC
  Administered 2014-07-24 – 2014-07-26 (×7): 30 mg via ORAL
  Filled 2014-07-24 (×7): qty 1

## 2014-07-24 MED ORDER — HYDROMORPHONE HCL 1 MG/ML IJ SOLN: 1.0000 mg | INTRAMUSCULAR | Status: DC | PRN

## 2014-07-24 MED ORDER — CALCIUM CARBONATE-VITAMIN D 500-200 MG-UNIT PO TABS
1.0000 | ORAL_TABLET | Freq: Every day | ORAL | Status: DC
  Administered 2014-07-25 – 2014-07-26 (×2): 1 via ORAL
  Filled 2014-07-24 (×2): qty 1

## 2014-07-24 MED ORDER — ONDANSETRON HCL 4 MG PO TABS: 4.0000 mg | ORAL_TABLET | Freq: Four times a day (QID) | ORAL | Status: DC | PRN

## 2014-07-24 MED ORDER — DILTIAZEM HCL 100 MG IV SOLR
5.0000 mg/h | INTRAVENOUS | Status: DC
  Administered 2014-07-24: 5 mg/h via INTRAVENOUS

## 2014-07-24 MED ORDER — ONDANSETRON HCL 4 MG/2ML IJ SOLN: 4.0000 mg | Freq: Four times a day (QID) | INTRAMUSCULAR | Status: DC | PRN

## 2014-07-24 MED ORDER — ACETAMINOPHEN 650 MG RE SUPP: 650.0000 mg | Freq: Four times a day (QID) | RECTAL | Status: DC | PRN

## 2014-07-24 MED ORDER — SODIUM CHLORIDE 0.9 % IV SOLN
Freq: Once | INTRAVENOUS | Status: AC
  Administered 2014-07-24: 13:00:00 via INTRAVENOUS

## 2014-07-24 MED ORDER — FUROSEMIDE 10 MG/ML IJ SOLN
20.0000 mg | Freq: Once | INTRAMUSCULAR | Status: AC
Start: 2014-07-24 — End: 2014-07-24
  Administered 2014-07-24: 20 mg via INTRAVENOUS
  Filled 2014-07-24: qty 2

## 2014-07-24 NOTE — Telephone Encounter (Signed)
New Msg      Pt c/o Shortness Of Breath: STAT if SOB developed within the last 24 hours or pt is noticeably SOB on the phone  1. Are you currently SOB (can you hear that pt is SOB on the phone)? Pt not on the phone, daughter calling because pt can't really speak without SOB.  2. How long have you been experiencing SOB? Last night around bedtime.  3. Are you SOB when sitting or when up moving around? When she gets up or tries to speak.  4. Are you currently experiencing any other symptoms? Weakness in her legs and arms and swollen under her ribs.     Please contact pt daughter Randell Patient at [phone].

## 2014-07-24 NOTE — ED Notes (Signed)
Patient presents to ed c/o abd. Pain onset  Last pm states she is also sob. She is able to speak in complete sentences. Breathing fast at times she states she is trying to calm herself.

## 2014-07-24 NOTE — Telephone Encounter (Signed)
Pts daughter calling concerned that her mom is very sob, and feels as if this is her heart.  Daughter states that the pt has horrible LEE and also notes that her abdomen area is very distended in appearance, making it more difficult for the pt to breath.  Daughter states that the pt is "puffy looking" in her upper extremities.  Daughter states the pt cannot speak in full sentences, without gasping for a breath.  Daughter states the pts intake and output has decreased over the course of a day.  Daughter states the pt has generalized weakness.  Daughter states the pt has no complaints of palpitations, cp, pre-syncopal, or syncopal episodes at this time.  Daughter described the pts breathing as labored.  Informed the daughter that Dr Meda Coffee is out of the office this week, but I advise the daughter to call 911 now, for high acuity level.  Informed the daughter that I will notify our cardmaster Trish that the pt will be coming to [facility] ED for further work-up of sob, and cardiac issues.  Daughter verbalized understanding and agrees with this plan.

## 2014-07-24 NOTE — Progress Notes (Signed)
UR Completed Kymia Simi Graves-Bigelow, RN,BSN [phone]  

## 2014-07-24 NOTE — ED Notes (Signed)
Pt no longer going to stepdown - waiting for tele bed. Spoke with bed control.

## 2014-07-24 NOTE — Telephone Encounter (Signed)
Trish notified of pt coming to [facility] via EMS at 1010.

## 2014-07-24 NOTE — ED Provider Notes (Addendum)
CSN: [id number]     Arrival date & time 07/24/14  1106 History   First MD Initiated Contact with Patient 07/24/14 1129     Chief Complaint  Patient presents with  . Weakness     (Consider location/radiation/quality/duration/timing/severity/associated sxs/prior Treatment) HPI Comments: 81 year old female with history of stroke, high blood pressure, any please right side, atrial fibrillation, metoprolol, pulmonary hypertension presents with fatigue, shortness of breath with exertion and mild epigastric fullness. Symptoms gradually worsening the past few days. This feels similar twinge her heart rate was not controlled last admission. Patient denies any fevers or chills. No chest pain. No weight gain or leg swelling. No congestive heart failure history.  Patient is a 81 y.o. female presenting with weakness. The history is provided by the patient.  Weakness Associated symptoms include shortness of breath. Pertinent negatives include no chest pain, no abdominal pain and no headaches.    Past Medical History  Diagnosis Date  . Hypertension   . Arthritis   . Atrial fibrillation   . Back pain   . Colon cancer   . Polymyalgia rheumatica   . Hypoglycemia   . HTN (hypertension) 08/08/2013  . A-fib 08/08/2013  . Stroke    Past Surgical History  Procedure Laterality Date  . Colon surgery    . Cesarean section    . Bilateral oophorectomy    . Eye surgery     Family History  Problem Relation Age of Onset  . Aneurysm Mother   . Heart attack Father    History  Substance Use Topics  . Smoking status: Never Smoker   . Smokeless tobacco: Not on file  . Alcohol Use: No   OB History    No data available     Review of Systems  Constitutional: Positive for fatigue. Negative for fever and chills.  HENT: Negative for congestion.   Eyes: Negative for visual disturbance.  Respiratory: Positive for shortness of breath.   Cardiovascular: Negative for chest pain.  Gastrointestinal: Positive  for nausea. Negative for vomiting and abdominal pain.  Genitourinary: Negative for dysuria and flank pain.  Musculoskeletal: Negative for back pain, neck pain and neck stiffness.  Skin: Negative for rash.  Neurological: Positive for weakness. Negative for light-headedness and headaches.      Allergies  Statins  Home Medications   Prior to Admission medications   Medication Sig Start Date End Date Taking? Authorizing Provider  acetaminophen (TYLENOL) 500 MG tablet Take 1 tablet (500 mg total) by mouth every 6 (six) hours as needed. Patient taking differently: Take 500 mg by mouth every 6 (six) hours as needed for moderate pain.  06/15/14  Yes Erlene Quan, PA-C  aspirin 81 MG chewable tablet Chew 1 tablet (81 mg total) by mouth daily. 08/19/13  Yes Daniel J Angiulli, PA-C  calcium-vitamin D (OSCAL WITH D) 500-200 MG-UNIT per tablet Take 1 tablet by mouth daily with breakfast.   Yes Historical Provider, MD  diclofenac (FLECTOR) 1.3 % PTCH Place 0.5 patches onto the skin 2 (two) times daily as needed (for pain).    Yes Historical Provider, MD  docusate sodium (COLACE) 100 MG capsule Take 200 mg by mouth daily as needed.    Yes Historical Provider, MD  Ibuprofen ([employer] IBUPROFEN) 200 MG CAPS Take 1 capsule (200 mg total) by mouth 2 (two) times daily. Patient taking differently: Take 200 mg by mouth 2 (two) times daily as needed (for pain).  06/28/14  Yes Dorothy Spark, MD  isosorbide mononitrate (  IMDUR) 30 MG 24 hr tablet Take 1 tablet (30 mg total) by mouth at bedtime. 02/02/14  Yes Dorothy Spark, MD  metoprolol succinate (TOPROL-XL) 25 MG 24 hr tablet Take 1 tablet (25 mg total) by mouth daily. 07/11/14  Yes Dorothy Spark, MD  metoprolol tartrate (LOPRESSOR) 25 MG tablet Take 25 mg by mouth 2 (two) times daily as needed (for dizziness spells).  07/08/14  Yes Historical Provider, MD  omeprazole (PRILOSEC OTC) 20 MG tablet Take 20 mg by mouth daily.    Yes Historical Provider, MD   predniSONE (DELTASONE) 5 MG tablet Take 5 mg by mouth every morning.    Yes Historical Provider, MD  sennosides-docusate sodium (SENOKOT-S) 8.6-50 MG tablet Take 1 tablet by mouth at bedtime.   Yes Historical Provider, MD  hydrochlorothiazide (HYDRODIURIL) 25 MG tablet Take 1 tablet (25 mg total) by mouth as needed (once daily as needed for swelling). Take this for 5 days only Patient not taking: Reported on 07/24/2014 06/28/14   Dorothy Spark, MD   BP 114/65 mmHg  Pulse 75  Temp(Src) 97.6 F (36.4 C) (Oral)  Resp 21  SpO2 99% Physical Exam  Constitutional: She is oriented to person, place, and time. She appears well-developed and well-nourished.  HENT:  Head: Normocephalic and atraumatic.  Dry mucous membranes  Eyes: Right eye exhibits no discharge. Left eye exhibits no discharge.  Neck: Normal range of motion. Neck supple. No tracheal deviation present.  Cardiovascular: Regular rhythm.  Tachycardia present.   Pulmonary/Chest: Effort normal. She has rales (sparse at bases).  Abdominal: Soft. She exhibits no distension. There is no tenderness. There is no guarding.  Musculoskeletal: She exhibits no edema.  Neurological: She is alert and oriented to person, place, and time. No cranial nerve deficit.  Skin: Skin is warm. No rash noted.  Psychiatric: She has a normal mood and affect.  Nursing note and vitals reviewed.   ED Course  Procedures (including critical care time)   EMERGENCY DEPARTMENT [place] CARDIAC EXAM "Study: Limited Ultrasound of the heart and pericardium"  INDICATIONS:Dyspnea Multiple views of the heart and pericardium were obtained in real-time with a multi-frequency probe.  PERFORMED PZ:WCHENI  IMAGES ARCHIVED?: Yes  FINDINGS: No pericardial effusion and Normal contractility  VIEWS USED: Parasternal long axis, Parasternal short axis and Apical 4 chamber   INTERPRETATION: Cardiac activity present, Pericardial effusioin absent and Normal contractility     EMERGENCY DEPARTMENT BILIARY ULTRASOUND INTERPRETATION "Study: Limited Abdominal Ultrasound of the gallbladder and common bile duct."  INDICATIONS: Abdominal pain Indication: Multiple views of the gallbladder and common bile duct were obtained in real-time with a Multi-frequency probe." PERFORMED BY:  Myself IMAGES ARCHIVED?: Yes FINDINGS: Gallstones absent, Gallbladder wall normal in thickness and Sonographic Murphy's sign absent LIMITATIONS: Bowel Gas INTERPRETATION: Normal limited   CRITICAL CARE Performed by: Mariea Clonts   Total critical care time: 35 min  Critical care time was exclusive of separately billable procedures and treating other patients.  Critical care was necessary to treat or prevent imminent or life-threatening deterioration.  Critical care was time spent personally by me on the following activities: development of treatment plan with patient and/or surrogate as well as nursing, discussions with consultants, evaluation of patient's response to treatment, examination of patient, obtaining history from patient or surrogate, ordering and performing treatments and interventions, ordering and review of laboratory studies, ordering and review of radiographic studies, pulse oximetry and re-evaluation of patient's condition.  Labs Review Labs Reviewed  CBC WITH DIFFERENTIAL -  Abnormal; Notable for the following:    WBC 11.0 (*)    Neutrophils Relative % 91 (*)    Neutro Abs 9.9 (*)    Lymphocytes Relative 5 (*)    Lymphs Abs 0.5 (*)    All other components within normal limits  BRAIN NATRIURETIC PEPTIDE - Abnormal; Notable for the following:    B Natriuretic Peptide 581.3 (*)    All other components within normal limits  COMPREHENSIVE METABOLIC PANEL - Abnormal; Notable for the following:    Glucose, Bld 124 (*)    AST 112 (*)    ALT 89 (*)    GFR calc non Af Amer 72 (*)    GFR calc Af Amer 83 (*)    All other components within normal limits  TROPONIN I   URINALYSIS, ROUTINE W REFLEX MICROSCOPIC  LIPASE, BLOOD    Imaging Review Dg Chest 2 View  07/24/2014   CLINICAL DATA:  Shortness of breath.  EXAM: CHEST  2 VIEW  COMPARISON:  06/14/2014 and 05/08/2014  FINDINGS: Lungs are adequately inflated with subtle prominence of the perihilar markings likely mild vascular congestion. No focal consolidation or effusion. Cardiomediastinal silhouette is unremarkable. There is calcified plaque over the thoracic aorta. Moderate stable biphasic curvature of the thoracolumbar spine. Diffuse decreased bone mineralization.  IMPRESSION: Findings suggesting mild vascular congestion.   Electronically Signed   By: Marin Olp M.D.   On: 07/24/2014 13:48     EKG Interpretation   Date/Time:  Monday July 24 2014 11:17:38 EST Ventricular Rate:  130 PR Interval:    QRS Duration: 106 QT Interval:  342 QTC Calculation: 503 R Axis:   3 Text Interpretation:  Ventricular premature complex Prolonged QT interval  Baseline wander in lead(s) II III aVF V2 V4 overall similar to previous  except tachycardia Confirmed by Orabelle Rylee  MD, Keyna Blizard ([id number]) on 07/24/2014  11:35:31 AM    EKG concerning for a flutter.  MDM   Final diagnoses:  Dyspnea  Atrial flutter with rapid ventricular response   Patient presents with gradual onset fatigue and dyspnea, heart rate tachycardic concerning for atrial flutter. Patient given Cardizem bolus and drip. Patient's symptoms improved on recheck. Patient converted to sinus rhythm. X-ray reviewed mild congestion. Plan for admission to telemetry, discussed with hospitalist. With mild epigastric fullness and mild LFT elevation bedside ultrasound done, limited exam due to bowel gas however no wall thickening or gallstones visualized.  The patients results and plan were reviewed and discussed.   Any x-rays performed were personally reviewed by myself.   Differential diagnosis were considered with the presenting HPI.  Medications   ondansetron (ZOFRAN) injection 4 mg (not administered)  diltiazem (CARDIZEM) tablet 30 mg (not administered)  furosemide (LASIX) injection 20 mg (not administered)  0.9 %  sodium chloride infusion (not administered)  diltiazem (CARDIZEM) 1 mg/mL load via infusion 10 mg (10 mg Intravenous New Bag/Given 07/24/14 1230)  0.9 %  sodium chloride infusion ( Intravenous New Bag/Given 07/24/14 1230)    Filed Vitals:   07/24/14 1145 07/24/14 1200 07/24/14 1245 07/24/14 1300  BP: 133/80 135/82 104/53 114/65  Pulse: 73 138 77 75  Temp:      TempSrc:      Resp: 19 22 21 21   SpO2: 97% 96% 95% 99%    Final diagnoses:  Dyspnea  Atrial flutter with rapid ventricular response    Admission/ observation were discussed with the admitting physician, patient and/or family and they are comfortable with the plan.  Mariea Clonts, MD 07/24/14 1512  Mariea Clonts, MD 07/24/14 [phone]

## 2014-07-24 NOTE — ED Notes (Signed)
Krystal Hensley, [place] in to see pt. Report given to [facility] RN.

## 2014-07-24 NOTE — Consult Note (Signed)
Reason for Consult:   PAF  Requesting Physician: [facility]  HPI: This is a 81 y.o. female, lives alone in her own home- daughter lives nearby. She has a past medical history significant for PAF, HTN, prior CVA, and PMR. She has not been considered an anticoagulation candidate secondary to high fall risk, (and her wishes to not pursue anticoagulation). She uses a walker at home. She was admitted in Dec 2015 with PAF which converted spontaneously to NSR with beta blocker. She saw Dr Meda Coffee in follow up the plan was to continue same medication with Toprol 25 mg daily and Lopressor 25 mg as needed for palpitations. She is admitted now with complaints of dyspnea since last night. She is not clear about palpitations but on arrival to the ER she is in AF with RVR. She converted spontaneously to NSR. Her BNP is elevated and her CXR suggests vascular congestion. We are asked to see in consult.   PMHx:  Past Medical History  Diagnosis Date  . Hypertension   . Arthritis   . Atrial fibrillation   . Back pain   . Colon cancer   . Polymyalgia rheumatica   . Hypoglycemia   . HTN (hypertension) 08/08/2013  . A-fib 08/08/2013  . Stroke     Past Surgical History  Procedure Laterality Date  . Colon surgery    . Cesarean section    . Bilateral oophorectomy    . Eye surgery      SOCHx:  reports that she has never smoked. She does not have any smokeless tobacco history on file. She reports that she does not drink alcohol or use illicit drugs.  FAMHx: Family History  Problem Relation Age of Onset  . Aneurysm Mother   . Heart attack Father     ALLERGIES: Allergies  Allergen Reactions  . Statins Other (See Comments)    myalgia    ROS: Pertinent items are noted in HPI. she denies fever or chill. See H&P for complete details  HOME MEDICATIONS: Prior to Admission medications   Medication Sig Start Date End Date Taking? Authorizing Provider  acetaminophen (TYLENOL) 500 MG  tablet Take 1 tablet (500 mg total) by mouth every 6 (six) hours as needed. Patient taking differently: Take 500 mg by mouth every 6 (six) hours as needed for moderate pain.  06/15/14  Yes Erlene Quan, PA-C  aspirin 81 MG chewable tablet Chew 1 tablet (81 mg total) by mouth daily. 08/19/13  Yes Daniel J Angiulli, PA-C  calcium-vitamin D (OSCAL WITH D) 500-200 MG-UNIT per tablet Take 1 tablet by mouth daily with breakfast.   Yes Historical Provider, MD  diclofenac (FLECTOR) 1.3 % [facility] 0.5 patches onto the skin 2 (two) times daily as needed (for pain).    Yes Historical Provider, MD  docusate sodium (COLACE) 100 MG capsule Take 200 mg by mouth daily as needed.    Yes Historical Provider, MD  Ibuprofen ([employer] IBUPROFEN) 200 MG CAPS Take 1 capsule (200 mg total) by mouth 2 (two) times daily. Patient taking differently: Take 200 mg by mouth 2 (two) times daily as needed (for pain).  06/28/14  Yes Dorothy Spark, MD  isosorbide mononitrate (IMDUR) 30 MG 24 hr tablet Take 1 tablet (30 mg total) by mouth at bedtime. 02/02/14  Yes Dorothy Spark, MD  metoprolol succinate (TOPROL-XL) 25 MG 24 hr tablet Take 1 tablet (25 mg total) by mouth daily. 07/11/14  Yes Houston Siren  Hazel Sams, MD  metoprolol tartrate (LOPRESSOR) 25 MG tablet Take 25 mg by mouth 2 (two) times daily as needed (for dizziness spells).  07/08/14  Yes Historical Provider, MD  omeprazole (PRILOSEC OTC) 20 MG tablet Take 20 mg by mouth daily.    Yes Historical Provider, MD  predniSONE (DELTASONE) 5 MG tablet Take 5 mg by mouth every morning.    Yes Historical Provider, MD  sennosides-docusate sodium (SENOKOT-S) 8.6-50 MG tablet Take 1 tablet by mouth at bedtime.   Yes Historical Provider, MD  hydrochlorothiazide (HYDRODIURIL) 25 MG tablet Take 1 tablet (25 mg total) by mouth as needed (once daily as needed for swelling). Take this for 5 days only Patient not taking: Reported on 07/24/2014 06/28/14   Dorothy Spark, MD    HOSPITAL  MEDICATIONS: I have reviewed the patient's current medications.  VITALS: Blood pressure 133/76, pulse 77, temperature 97.6 F (36.4 C), temperature source Oral, resp. rate 23, SpO2 97 %.  PHYSICAL EXAM: General appearance: alert, cooperative, no distress and pale Neck: JVD noted, no bruit Lungs: clear to auscultation bilaterally Heart: regular rate and rhythm and soft systolic murmur AOV area Abdomen: soft, non-tender; bowel sounds normal; no masses,  no organomegaly Extremities: extremities normal, atraumatic, no cyanosis or edema Pulses: 2+ and symmetric Skin: Skin color, texture, turgor normal. No rashes or lesions Neurologic: Grossly normal  LABS: Results for orders placed or performed during the hospital encounter of 07/24/14 (from the past 24 hour(s))  CBC with Differential     Status: Abnormal   Collection Time: 07/24/14 11:37 AM  Result Value Ref Range   WBC 11.0 (H) 4.0 - 10.5 K/uL   RBC 4.31 3.87 - 5.11 MIL/uL   Hemoglobin 14.2 12.0 - 15.0 g/dL   HCT 42.4 36.0 - 46.0 %   MCV 98.4 78.0 - 100.0 fL   MCH 32.9 26.0 - 34.0 pg   MCHC 33.5 30.0 - 36.0 g/dL   RDW 14.3 11.5 - 15.5 %   Platelets 223 150 - 400 K/uL   Neutrophils Relative % 91 (H) 43 - 77 %   Neutro Abs 9.9 (H) 1.7 - 7.7 K/uL   Lymphocytes Relative 5 (L) 12 - 46 %   Lymphs Abs 0.5 (L) 0.7 - 4.0 K/uL   Monocytes Relative 4 3 - 12 %   Monocytes Absolute 0.5 0.1 - 1.0 K/uL   Eosinophils Relative 0 0 - 5 %   Eosinophils Absolute 0.0 0.0 - 0.7 K/uL   Basophils Relative 0 0 - 1 %   Basophils Absolute 0.0 0.0 - 0.1 K/uL  Troponin I     Status: None   Collection Time: 07/24/14 11:37 AM  Result Value Ref Range   Troponin I <0.03 <0.031 ng/mL  Brain natriuretic peptide     Status: Abnormal   Collection Time: 07/24/14 12:16 PM  Result Value Ref Range   B Natriuretic Peptide 581.3 (H) 0.0 - 100.0 pg/mL  Comprehensive metabolic panel     Status: Abnormal   Collection Time: 07/24/14 12:18 PM  Result Value Ref  Range   Sodium 138 135 - 145 mmol/L   Potassium 4.7 3.5 - 5.1 mmol/L   Chloride 105 96 - 112 mEq/L   CO2 23 19 - 32 mmol/L   Glucose, Bld 124 (H) 70 - 99 mg/dL   BUN 10 6 - 23 mg/dL   Creatinine, Ser 0.71 0.50 - 1.10 mg/dL   Calcium 9.0 8.4 - 10.5 mg/dL   Total Protein 6.0 6.0 -  8.3 g/dL   Albumin 3.5 3.5 - 5.2 g/dL   AST 112 (H) 0 - 37 U/L   ALT 89 (H) 0 - 35 U/L   Alkaline Phosphatase 113 39 - 117 U/L   Total Bilirubin 0.8 0.3 - 1.2 mg/dL   GFR calc non Af Amer 72 (L) >90 mL/min   GFR calc Af Amer 83 (L) >90 mL/min   Anion gap 10 5 - 15  Lipase, blood     Status: None   Collection Time: 07/24/14 12:18 PM  Result Value Ref Range   Lipase 28 11 - 59 U/L  Urinalysis, Routine w reflex microscopic     Status: None   Collection Time: 07/24/14 12:39 PM  Result Value Ref Range   Color, Urine YELLOW YELLOW   APPearance CLEAR CLEAR   Specific Gravity, Urine 1.010 1.005 - 1.030   pH 7.5 5.0 - 8.0   Glucose, UA NEGATIVE NEGATIVE mg/dL   Hgb urine dipstick NEGATIVE NEGATIVE   Bilirubin Urine NEGATIVE NEGATIVE   Ketones, ur NEGATIVE NEGATIVE mg/dL   Protein, ur NEGATIVE NEGATIVE mg/dL   Urobilinogen, UA 0.2 0.0 - 1.0 mg/dL   Nitrite NEGATIVE NEGATIVE   Leukocytes, UA NEGATIVE NEGATIVE    EKG: On admission AF with RVR, IVCD  IMAGING: Dg Chest 2 View  07/24/2014   CLINICAL DATA:  Shortness of breath.  EXAM: CHEST  2 VIEW  COMPARISON:  06/14/2014 and 05/08/2014  FINDINGS: Lungs are adequately inflated with subtle prominence of the perihilar markings likely mild vascular congestion. No focal consolidation or effusion. Cardiomediastinal silhouette is unremarkable. There is calcified plaque over the thoracic aorta. Moderate stable biphasic curvature of the thoracolumbar spine. Diffuse decreased bone mineralization.  IMPRESSION: Findings suggesting mild vascular congestion.   Electronically Signed   By: Marin Olp M.D.   On: 07/24/2014 13:48    IMPRESSION: Principal Problem:   Atrial  fibrillation with rapid ventricular response Active Problems:   Dyspnea   CHF- presumably secondary to rapid AF - EF 65-70% Feb 2015   HTN (hypertension)   Hx of PAF- CHADS VASC=7 for age, sex, stroke, HTN   CVA (cerebral infarction)-by MRI Feb 2015   PMR (polymyalgia rheumatica)   Pulmonary hypertension   Constipation   Abdominal pain, right upper quadrant   RECOMMENDATION: Continue Diltiazem and Toprol. One dose of IV Lasix 20 mg given in ED. Will follow with you.  Time Spent Directly with Patient: 45 minutes  Erlene Quan [phone] beeper 07/24/2014, 4:07 PM   Patient examined chart reviewed.  Admitted with mild CHF exacerbation with PAF.  Converted to NSR now Exam remarkable for elderly white female.  Mild weakness right hand from previous CVA.  Basilar crackles Agree with cardizem and metoprolol , as needed lasix.  Telemetry now with NSR rates 78.  Agree with  Daughter that move to assisted living would be appropriate.  No anticoagulation due to falls and advance Age per Dr Orion Modest

## 2014-07-24 NOTE — H&P (Signed)
History and Physical       Hospital Admission Note Date: 07/24/2014  Patient name: Krystal Hensley Medical record number: [id number] Date of birth: 1918/03/20 Age: 81 y.o. Gender: female  PCP: [name], MD  Primary cardiologist: Dr. Meda Coffee  Chief Complaint:  Shortness of breath since last night   HPI: Patient is a 81 year old female with history of CVA, hypertension, paroxysmal atrial fibrillation, pulmonary hypertension, polymyalgia rheumatica, arthritis presented to ED with fatigue, shortness of breath with exertion, abdominal pain. Patient reported that she felt very short of breath earlier this morning, started last night, could not speak in full sentences. She also felt abdominal bloating and right upper quadrant abdominal pain. Patient reported that she was wearing a back support for chronic back pain, when she took it off she noticed epigastric and right upper quadrant abdominal pain. At the time of my examination, she had no abdominal tenderness and pain had resolved. She denied any chest pain but having dry coughing. Patient complained of generalized weakness otherwise no dizziness or lightheadedness or any syncopal episode. In ED, patient was noted to be in atrial fibrillation with RVR, heart rate 138, she was placed on Cardizem drip. Hospitalist service was requested for admission. At the time of my encounter, heart rate had improved to 70s, patient converted to normal sinus rhythm. She reported that she had no shortness of breath at rest at the time of my examination,   Review of Systems:  Constitutional: Denies fever, chills, diaphoresis,+ poor appetite and fatigue.  HEENT: Denies photophobia, eye pain, redness, hearing loss, ear pain, congestion, sore throat, rhinorrhea, sneezing, mouth sores, trouble swallowing, neck pain, neck stiffness and tinnitus.   Respiratory:Please see history of present illness   Cardiovascular: Denies chest pain,  she has chronic leg swelling, was started on HCTZ by her cardiologist, Dr. Meda Coffee   Gastrointestinal: Denies nausea, vomiting, abdominal pain, diarrhea, constipation, blood in stool and abdominal distention.  Genitourinary: Denies dysuria, urgency, frequency, hematuria, flank pain and difficulty urinating.  Musculoskeletal: Denies myalgias, joint swelling, arthralgias and gait problem. Chronic back pain + Skin: Denies pallor, rash and wound.  Neurological: Denies dizziness, seizures, syncope, weakness, light-headedness, numbness and headaches.  Hematological: Denies adenopathy. Easy bruising, personal or family bleeding history  Psychiatric/Behavioral: Denies suicidal ideation, mood changes, confusion, nervousness, sleep disturbance and agitation  Past Medical History: Past Medical History  Diagnosis Date  . Hypertension   . Arthritis   . Atrial fibrillation   . Back pain   . Colon cancer   . Polymyalgia rheumatica   . Hypoglycemia   . HTN (hypertension) 08/08/2013  . A-fib 08/08/2013  . Stroke    Past Surgical History  Procedure Laterality Date  . Colon surgery    . Cesarean section    . Bilateral oophorectomy    . Eye surgery      Medications: Prior to Admission medications   Medication Sig Start Date End Date Taking? Authorizing Provider  acetaminophen (TYLENOL) 500 MG tablet Take 1 tablet (500 mg total) by mouth every 6 (six) hours as needed. Patient taking differently: Take 500 mg by mouth every 6 (six) hours as needed for moderate pain.  06/15/14  Yes Erlene Quan, PA-C  aspirin 81 MG chewable tablet Chew 1 tablet (81 mg total) by mouth daily. 08/19/13  Yes Daniel J Angiulli, PA-C  calcium-vitamin D (OSCAL WITH D) 500-200 MG-UNIT per tablet Take 1 tablet by mouth daily with breakfast.   Yes Historical Provider, MD  diclofenac (FLECTOR) 1.3 %  [place] 0.5 patches onto the skin 2 (two) times daily as needed (for pain).    Yes Historical  Provider, MD  docusate sodium (COLACE) 100 MG capsule Take 200 mg by mouth daily as needed.    Yes Historical Provider, MD  Ibuprofen ([employer] IBUPROFEN) 200 MG CAPS Take 1 capsule (200 mg total) by mouth 2 (two) times daily. Patient taking differently: Take 200 mg by mouth 2 (two) times daily as needed (for pain).  06/28/14  Yes Dorothy Spark, MD  isosorbide mononitrate (IMDUR) 30 MG 24 hr tablet Take 1 tablet (30 mg total) by mouth at bedtime. 02/02/14  Yes Dorothy Spark, MD  metoprolol succinate (TOPROL-XL) 25 MG 24 hr tablet Take 1 tablet (25 mg total) by mouth daily. 07/11/14  Yes Dorothy Spark, MD  metoprolol tartrate (LOPRESSOR) 25 MG tablet Take 25 mg by mouth 2 (two) times daily as needed (for dizziness spells).  07/08/14  Yes Historical Provider, MD  omeprazole (PRILOSEC OTC) 20 MG tablet Take 20 mg by mouth daily.    Yes Historical Provider, MD  predniSONE (DELTASONE) 5 MG tablet Take 5 mg by mouth every morning.    Yes Historical Provider, MD  sennosides-docusate sodium (SENOKOT-S) 8.6-50 MG tablet Take 1 tablet by mouth at bedtime.   Yes Historical Provider, MD  hydrochlorothiazide (HYDRODIURIL) 25 MG tablet Take 1 tablet (25 mg total) by mouth as needed (once daily as needed for swelling). Take this for 5 days only Patient not taking: Reported on 07/24/2014 06/28/14   Dorothy Spark, MD    Allergies:   Allergies  Allergen Reactions  . Statins Other (See Comments)    myalgia    Social History:  reports that she has never smoked. She does not have any smokeless tobacco history on file. She reports that she does not drink alcohol or use illicit drugs.  Family History: Family History  Problem Relation Age of Onset  . Aneurysm Mother   . Heart attack Father     Physical Exam: Blood pressure 133/76, pulse 77, temperature 97.6 F (36.4 C), temperature source Oral, resp. rate 23, SpO2 97 %. General: Alert, awake, oriented x3, in no acute distress. HEENT: normocephalic,  atraumatic, anicteric sclera, pink conjunctiva, pupils equal and reactive to light and accomodation, oropharynx clear Neck: supple, no masses or lymphadenopathy, no goiter, no bruits  Heart: Regular rate and rhythm, without murmurs, rubs or gallops. Lungs: Clear to auscultation bilaterally, no wheezing, rales or rhonchi. Abdomen: Soft, nontender, nondistended, positive bowel sounds, no masses. Extremities: No clubbing, cyanosis or edema with positive pedal pulses. fragile skin with chronic venous stasis changes but no edema  Neuro: Grossly intact, no focal neurological deficits, strength 5/5 upper and lower extremities bilaterally Psych: alert and oriented x 3, normal mood and affect Skin: no rashes or lesions, warm and dry   LABS on Admission:  Basic Metabolic Panel:  Recent Labs Lab 07/24/14 1218  NA 138  K 4.7  CL 105  CO2 23  GLUCOSE 124*  BUN 10  CREATININE 0.71  CALCIUM 9.0   Liver Function Tests:  Recent Labs Lab 07/24/14 1218  AST 112*  ALT 89*  ALKPHOS 113  BILITOT 0.8  PROT 6.0  ALBUMIN 3.5   No results for input(s): LIPASE, AMYLASE in the last 168 hours. No results for input(s): AMMONIA in the last 168 hours. CBC:  Recent Labs Lab 07/24/14 1137  WBC 11.0*  NEUTROABS 9.9*  HGB 14.2  HCT 42.4  MCV  98.4  PLT 223   Cardiac Enzymes:  Recent Labs Lab 07/24/14 1137  TROPONINI <0.03   BNP: Invalid input(s): POCBNP CBG: No results for input(s): GLUCAP in the last 168 hours.   Radiological Exams on Admission: Dg Chest 2 View  07/24/2014   CLINICAL DATA:  Shortness of breath.  EXAM: CHEST  2 VIEW  COMPARISON:  06/14/2014 and 05/08/2014  FINDINGS: Lungs are adequately inflated with subtle prominence of the perihilar markings likely mild vascular congestion. No focal consolidation or effusion. Cardiomediastinal silhouette is unremarkable. There is calcified plaque over the thoracic aorta. Moderate stable biphasic curvature of the thoracolumbar spine.  Diffuse decreased bone mineralization.  IMPRESSION: Findings suggesting mild vascular congestion.   Electronically Signed   By: Marin Olp M.D.   On: 07/24/2014 13:48    Assessment/Plan Principal Problem:   Atrial fibrillation with RVR; patient has a history of proximal atrial fibrillation, was placed on beta blocker by her primary cardiologist, Dr. Meda Coffee., Toprol 25 mg daily  - Currently converted to normal sinus rhythm, transition to on oral Cardizem 30mg  q6hours, and drip off after starting the oral Cardizem. Rule out acute ACS, obtain serial cardiac enzymes, d-dimer - CHADS-VASC score 4, however patient was not considered anticoagulation candidate by her cardiologist due to risk of falls with her age, will continue aspirin   Active Problems: Dyspnea with exertion: Likely due to #1, also has a history of moderate pulmonary hypertension - Patient feels her symptoms are improving as heart rate is now more controlled, rule out acute ACS, obtain 2-D echocardiogram - Chest x-ray showed mild pulmonary congestion, BNP 581.3, likely due to rapid A. fib, will give Lasix 20 mg IV 1    CVA (cerebral infarction)-by MRI Feb 2015 - Continue aspirin    HTN (hypertension) - Started on Cardizem, continue metoprolol    PMR (polymyalgia rheumatica) - Continue prednisone   Constipation -Continue Colace     Abdominal pain, right upper quadrant - Continue PPI, obtain abdominal ultrasound to rule out any cholelithiasis, AST elevated at 112   DVT prophylaxis:  Lovenox   CODE STATUS:  full CODE STATUS   Family Communication: Admission, patients condition and plan of care including tests being ordered have been discussed with the patient and daughter who indicates understanding and agree with the plan and Code Status   Further plan will depend as patient's clinical course evolves and further radiologic and laboratory data become available.   Time Spent on Admission: 1 hour  Tamyka Bezio  M.D. [facility] 07/24/2014, 3:19 PM Pager: [phone]  If 7PM-7AM, please contact night-coverage [email or web address] Password [email or web address]

## 2014-07-25 ENCOUNTER — Inpatient Hospital Stay (HOSPITAL_COMMUNITY): Payer: Medicare Other

## 2014-07-25 DIAGNOSIS — I359 Nonrheumatic aortic valve disorder, unspecified: Secondary | ICD-10-CM

## 2014-07-25 LAB — BASIC METABOLIC PANEL
ANION GAP: 8 (ref 5–15)
BUN: 9 mg/dL (ref 6–23)
CO2: 27 mmol/L (ref 19–32)
CREATININE: 0.72 mg/dL (ref 0.50–1.10)
Calcium: 8.8 mg/dL (ref 8.4–10.5)
Chloride: 105 mEq/L (ref 96–112)
GFR calc Af Amer: 83 mL/min — ABNORMAL LOW (ref >90)
GFR, EST NON AFRICAN AMERICAN: 71 mL/min — AB (ref >90)
GLUCOSE: 87 mg/dL (ref 70–99)
POTASSIUM: 4 mmol/L (ref 3.5–5.1)
SODIUM: 140 mmol/L (ref 135–145)

## 2014-07-25 LAB — CBC
HCT: 41.7 % (ref 36.0–46.0)
HEMOGLOBIN: 13.8 g/dL (ref 12.0–15.0)
MCH: 32.5 pg (ref 26.0–34.0)
MCHC: 33.1 g/dL (ref 30.0–36.0)
MCV: 98.3 fL (ref 78.0–100.0)
Platelets: 205 10*3/uL (ref 150–400)
RBC: 4.24 MIL/uL (ref 3.87–5.11)
RDW: 14.5 % (ref 11.5–15.5)
WBC: 7.8 10*3/uL (ref 4.0–10.5)

## 2014-07-25 LAB — TROPONIN I

## 2014-07-25 MED ORDER — METOPROLOL SUCCINATE ER 50 MG PO TB24
50.0000 mg | ORAL_TABLET | Freq: Every day | ORAL | Status: DC
  Administered 2014-07-25 – 2014-07-26 (×2): 50 mg via ORAL
  Filled 2014-07-25 (×2): qty 1

## 2014-07-25 NOTE — Evaluation (Signed)
Physical Therapy Evaluation Patient Details Name: Krystal Hensley MRN: [id number] DOB: [birth year] Today's Date: 07/25/2014   History of Present Illness  Patient is a 81 year old female with history of CVA, hypertension, paroxysmal atrial fibrillation, pulmonary hypertension, polymyalgia rheumatica, arthritis presented to ED with fatigue, shortness of breath with exertion,   Clinical Impression  Pt admitted with/for Sob AND afib with RVR.  Pt currently limited functionally due to the problems listed below.  (see problems list.)  Pt will benefit from PT to maximize function and safety to be able to get home safely with available assist of family and caregiver .     Follow Up Recommendations Home health PT;Supervision - Intermittent    Equipment Recommendations       Recommendations for Other Services       Precautions / Restrictions        Mobility  Bed Mobility Overal bed mobility: Needs Assistance Bed Mobility: Supine to Sit;Sit to Supine     Supine to sit: Supervision Sit to supine: Supervision   General bed mobility comments: slow, but  no assistance and no rail  Transfers Overall transfer level: Needs assistance Equipment used: None Transfers: Sit to/from Stand Sit to Stand: Supervision         General transfer comment: good transfer technque  Ambulation/Gait Ambulation/Gait assistance: Supervision Ambulation Distance (Feet): 400 Feet Assistive device: Rolling walker (2 wheeled) Gait Pattern/deviations: Step-through pattern   Gait velocity interpretation: at or above normal speed for age/gender General Gait Details: generally steady.  Stops when she feels she needs rest.  Knew when it was time to step.  EHR remained in the 80's  In sinus rhythm  Stairs            Wheelchair Mobility    Modified Rankin (Stroke Patients Only)       Balance Overall balance assessment: Needs assistance Sitting-balance support: No upper extremity  supported Sitting balance-Leahy Scale: Good     Standing balance support: No upper extremity supported Standing balance-Leahy Scale: Fair                               Pertinent Vitals/Pain Pain Assessment: No/denies pain    Home Living Family/patient expects to be discharged to:: Private residence Living Arrangements: Alone Available Help at Discharge: Family;Available PRN/intermittently Type of Home: House Home Access: Stairs to enter Entrance Stairs-Rails: Can reach both Entrance Stairs-Number of Steps: 2 Home Layout: One level Home Equipment: Walker - 4 wheels;Walker - 2 wheels;Cane - single point;Tub bench;Grab bars - toilet;Grab bars - tub/shower      Prior Function Level of Independence: Independent with assistive device(s)         Comments: knitting, crochet, crafts     Hand Dominance   Dominant Hand: Right    Extremity/Trunk Assessment   Upper Extremity Assessment: Overall WFL for tasks assessed;Generalized weakness           Lower Extremity Assessment: Overall WFL for tasks assessed (grossly >4/5 bil)         Communication   Communication: No difficulties  Cognition Arousal/Alertness: Awake/alert Behavior During Therapy: WFL for tasks assessed/performed Overall Cognitive Status: Within Functional Limits for tasks assessed                      General Comments      Exercises        Assessment/Plan    PT Assessment Patient needs continued PT  services  PT Diagnosis Generalized weakness (decr. activity tolerance)   PT Problem List Decreased strength;Decreased activity tolerance;Decreased balance;Decreased mobility;Decreased knowledge of use of DME  PT Treatment Interventions DME instruction;Gait training;Stair training;Functional mobility training;Therapeutic activities;Balance training;Patient/family education   PT Goals (Current goals can be found in the Care Plan section) Acute Rehab PT Goals Patient Stated Goal:  I'd like to get back home at my previous level of function. PT Goal Formulation: With patient Time For Goal Achievement: 08/01/14 Potential to Achieve Goals: Good    Frequency Min 3X/week   Barriers to discharge        Co-evaluation               End of Session   Activity Tolerance: Patient tolerated treatment well Patient left: in bed;with call bell/phone within reach Nurse Communication: Mobility status         Time: 7616-0737 PT Time Calculation (min) (ACUTE ONLY): 34 min   Charges:   PT Evaluation $Initial PT Evaluation Tier I: 1 Procedure PT Treatments $Gait Training: 8-22 mins $Therapeutic Activity: 8-22 mins   PT G Codes:        Wilgus Deyton, Tessie Fass 07/25/2014, 3:06 PM 07/25/2014  Donnella Sham, PT [phone]  (pager)

## 2014-07-25 NOTE — Care Management Note (Addendum)
    Page 1 of 1   07/26/2014     10:59:29 AM CARE MANAGEMENT NOTE 07/26/2014  Patient:  Krystal Hensley, Krystal Hensley   Account Number:  [id number]  Date Initiated:  07/25/2014  Documentation initiated by:  GRAVES-BIGELOW,Shanieka Blea  Subjective/Objective Assessment:   Pt admitted for SOB and Afi RVR. Pt is from home alone.     Action/Plan:   CM to monitor for disposition needs.   Anticipated DC Date:  07/26/2014   Anticipated DC Plan:  [place]  CM consult      [facility] Choice  HOME HEALTH   Choice offered to / List presented to:  C-1 Patient        [place] arranged  HH-1 RN  [place] PT      [place] agency  [place]   Status of service:  Completed, signed off Medicare Important Message given?  NA - LOS <3 / Initial given by admissions (If response is "NO", the following Medicare IM given date fields will be blank) Date Medicare IM given:   Medicare IM given by:   Date Additional Medicare IM given:   Additional Medicare IM given by:    Discharge Disposition:  [place]  Per UR Regulation:  Reviewed for med. necessity/level of care/duration of stay  If discussed at [place] of Stay Meetings, dates discussed:    Comments:  07-26-14 [place], RN,BSN [phone] CM did make referral with [facility]. SOC to begin within 24-48 hrs of d/c.  List of personal care providers given to pt as well. Pt has Meals on Wheels in place and life alert. No further needs from CM at this time.

## 2014-07-25 NOTE — Progress Notes (Signed)
Patient Name: Krystal Hensley Date of Encounter: 07/25/2014     Principal Problem:   Atrial fibrillation with rapid ventricular response Active Problems:   CVA (cerebral infarction)-by MRI Feb 2015   HTN (hypertension)   PMR (polymyalgia rheumatica)   Hx of PAF- CHADS VASC=7 for age, sex, stroke, HTN   Pulmonary hypertension   Constipation   Dyspnea   Abdominal pain, right upper quadrant   CHF- presumably secondary to rapid AF - EF 65-70% Feb 2015    SUBJECTIVE  Breathing much better after IV lasix.. Did feel her heart race a couple of times during the night. Otherwise doing okay with no complaints.   CURRENT MEDS . aspirin  81 mg Oral Daily  . calcium-vitamin D  1 tablet Oral Q breakfast  . diltiazem  30 mg Oral 4 times per day  . enoxaparin (LOVENOX) injection  30 mg Subcutaneous Q24H  . metoprolol succinate  25 mg Oral Daily  . pantoprazole  40 mg Oral Daily  . predniSONE  5 mg Oral q morning - 10a  . sennosides-docusate sodium  1 tablet Oral QHS  . sodium chloride  3 mL Intravenous Q12H    OBJECTIVE  Filed Vitals:   07/24/14 1621 07/24/14 2019 07/25/14 0427 07/25/14 0731  BP: 129/76 115/81 130/69 144/73  Pulse: 77 78 74 79  Temp: 97.8 F (36.6 C) 98.1 F (36.7 C) 97.7 F (36.5 C) 98.6 F (37 C)  TempSrc: Oral Oral Oral Oral  Resp: 18 20 18 22   Height: 5\' 1"  (1.549 m)     Weight: 114 lb (51.71 kg)  109 lb 11.2 oz (49.76 kg)   SpO2: 99% 96% 95% 99%    Intake/Output Summary (Last 24 hours) at 07/25/14 0741 Last data filed at 07/25/14 0700  Gross per 24 hour  Intake    360 ml  Output   2450 ml  Net  -2090 ml   Filed Weights   07/24/14 1621 07/25/14 0427  Weight: 114 lb (51.71 kg) 109 lb 11.2 oz (49.76 kg)    PHYSICAL EXAM General appearance: alert, cooperative, no distress and pale Neck: JVD noted, no bruit Lungs: clear to auscultation bilaterally Heart: regular rate and rhythm and soft systolic murmur AOV area Abdomen: soft, non-tender; bowel  sounds normal; no masses, no organomegaly Extremities: extremities normal, atraumatic, no cyanosis or edema Pulses: 2+ and symmetric Skin: Skin color, texture, turgor normal. No rashes or lesions Neurologic: Grossly normal  Accessory Clinical Findings  CBC  Recent Labs  07/24/14 1137 07/25/14 0340  WBC 11.0* 7.8  NEUTROABS 9.9*  --   HGB 14.2 13.8  HCT 42.4 41.7  MCV 98.4 98.3  PLT 223 408   Basic Metabolic Panel  Recent Labs  07/24/14 1218 07/25/14 0340  NA 138 140  K 4.7 4.0  CL 105 105  CO2 23 27  GLUCOSE 124* 87  BUN 10 9  CREATININE 0.71 0.72  CALCIUM 9.0 8.8   Liver Function Tests  Recent Labs  07/24/14 1218  AST 112*  ALT 89*  ALKPHOS 113  BILITOT 0.8  PROT 6.0  ALBUMIN 3.5    Recent Labs  07/24/14 1218  LIPASE 28   Cardiac Enzymes  Recent Labs  07/24/14 1705 07/24/14 2227 07/25/14 0340  TROPONINI <0.03 <0.03 <0.03    Thyroid Function Tests  Recent Labs  07/24/14 1705  TSH 1.076    TELE  In and out of atrial fib/flutter since last night. HR currently 90-100s  Radiology/Studies  Dg Chest 2 View  07/24/2014   CLINICAL DATA:  Shortness of breath.  EXAM: CHEST  2 VIEW  COMPARISON:  06/14/2014 and 05/08/2014  FINDINGS: Lungs are adequately inflated with subtle prominence of the perihilar markings likely mild vascular congestion. No focal consolidation or effusion. Cardiomediastinal silhouette is unremarkable. There is calcified plaque over the thoracic aorta. Moderate stable biphasic curvature of the thoracolumbar spine. Diffuse decreased bone mineralization.  IMPRESSION: Findings suggesting mild vascular congestion.   Electronically Signed   By: Marin Olp M.D.   On: 07/24/2014 13:48    ASSESSMENT AND PLAN  LEGNA MAUSOLF is a 81 y.o. female with a history of PAF, HTN, prior CVA, and PMR who presented [facility] ED 07/24/14 with dyspnea and found to be in AF with RVR. Additionally, her BNP was noted to be elevated and her CXR  suggests vascular congestion and cardiology was consulted.  AF with RVR- spontaneously converted in the ED. Now back in atrial fibrillation/flutter with high rates this AM. Currently afib HRs 90s. Will obtain an ECG -- CHADSVASC score at least 6  (CVA 2, HTN 1, age 38, F sex 1): however, not an anticoagulation candidate 2/2 high fall risk, (and her wishes to not pursue anticoagulation).  -- Continue Toprol 25 mg daily and dilt 30mg  QID  -- TSH normal  Acute on chronic diastolic CHF- BNP ~388 and CXR with pulm vasc congestion. -- EF 65-70% w/ G1DD, mild MR and PA pressure 39mmHG on ECHO 08/2013 -- Given 20mg  IV Lasix with good diuresis. Net neg 2L and weight down 5lbs (114--> 109 lbs). Breathing is much improved. -- Repeat 2D ECHO today.     Judy Pimple PA-C  Pager [phone]  History and all data above reviewed.  Patient examined.  I agree with the findings as above.  She is breathing better but not back to baseline.  The patient exam reveals [id number]  ,  Lungs: Clear  ,  Abd: Positive bowel sounds, no rebound no guarding, Ext No edema   .  All available labs, radiology testing, previous records reviewed. Agree with documented assessment and plan. Atrial fib:  She does not want anticoagulation at this time but she will talk about it with Dr. Meda Coffee in the future.  I will increase the beta blocker slightly today.     Jeneen Rinks [facility]  9:41 AM  07/25/2014

## 2014-07-25 NOTE — Progress Notes (Signed)
PROGRESS NOTE  JAKE GOODSON KZS:[id number] DOB: 1917-07-17 DOA: 07/24/2014 PCP: [name], MD  Assessment/Plan: Atrial fibrillation with RVR; patient has a history of proximal atrial fibrillation, was placed on beta blocker by her primary cardiologist, Dr. Meda Coffee., Toprol 25 mg daily  - Currently converted to normal sinus rhythm, transition to on oral Cardizem 30mg  q6hours, and drip off after starting the oral Cardizem - CHADS-VASC score 4, however patient was not considered anticoagulation candidate by her cardiologist due to risk of falls with her age, will continue aspirin   Dyspnea with exertion: Likely due to #1, also has a history of moderate pulmonary hypertension - Patient feels her symptoms are improving as heart rate is now more controlled, rule out acute ACS, 2-D echocardiogram - Chest x-ray showed mild pulmonary congestion, BNP 581.3, likely due to rapid A. fib,  Lasix 20 mg IV 1   CVA (cerebral infarction)-by MRI Feb 2015 - Continue aspirin   HTN (hypertension) - Started on Cardizem, continue metoprolol   PMR (polymyalgia rheumatica) - Continue prednisone  Constipation -Continue Colace    Abdominal pain, right upper quadrant - Continue PPI, normal u/s   Code Status: full Family Communication:  Disposition Plan:    Consultants:  cards  Procedures:      HPI/Subjective: Not feeling well this AM- not sure what's wrong- "just old"  Objective: Filed Vitals:   07/25/14 0731  BP: 144/73  Pulse: 79  Temp: 98.6 F (37 C)  Resp: 22    Intake/Output Summary (Last 24 hours) at 07/25/14 1050 Last data filed at 07/25/14 0951  Gross per 24 hour  Intake    720 ml  Output   2450 ml  Net  -1730 ml   Filed Weights   07/24/14 1621 07/25/14 0427  Weight: 51.71 kg (114 lb) 49.76 kg (109 lb 11.2 oz)    Exam:   General:  Pleasant/cooperative  Cardiovascular: irr  Respiratory: clear  Abdomen: +BS, soft  Musculoskeletal: no  edema- few areas of bruising   Data Reviewed: Basic Metabolic Panel:  Recent Labs Lab 07/24/14 1218 07/25/14 0340  NA 138 140  K 4.7 4.0  CL 105 105  CO2 23 27  GLUCOSE 124* 87  BUN 10 9  CREATININE 0.71 0.72  CALCIUM 9.0 8.8   Liver Function Tests:  Recent Labs Lab 07/24/14 1218  AST 112*  ALT 89*  ALKPHOS 113  BILITOT 0.8  PROT 6.0  ALBUMIN 3.5    Recent Labs Lab 07/24/14 1218  LIPASE 28   No results for input(s): AMMONIA in the last 168 hours. CBC:  Recent Labs Lab 07/24/14 1137 07/25/14 0340  WBC 11.0* 7.8  NEUTROABS 9.9*  --   HGB 14.2 13.8  HCT 42.4 41.7  MCV 98.4 98.3  PLT 223 205   Cardiac Enzymes:  Recent Labs Lab 07/24/14 1137 07/24/14 1705 07/24/14 2227 07/25/14 0340  TROPONINI <0.03 <0.03 <0.03 <0.03   BNP (last 3 results)  Recent Labs  01/14/14 1039  PROBNP 1065.0*   CBG: No results for input(s): GLUCAP in the last 168 hours.  No results found for this or any previous visit (from the past 240 hour(s)).   Studies: Dg Chest 2 View  07/24/2014   CLINICAL DATA:  Shortness of breath.  EXAM: CHEST  2 VIEW  COMPARISON:  06/14/2014 and 05/08/2014  FINDINGS: Lungs are adequately inflated with subtle prominence of the perihilar markings likely mild vascular congestion. No focal consolidation or effusion. Cardiomediastinal silhouette is unremarkable. There is  calcified plaque over the thoracic aorta. Moderate stable biphasic curvature of the thoracolumbar spine. Diffuse decreased bone mineralization.  IMPRESSION: Findings suggesting mild vascular congestion.   Electronically Signed   By: Marin Olp M.D.   On: 07/24/2014 13:48   US Abdomen Complete  07/25/2014   CLINICAL DATA:  Abdominal pain  EXAM: ULTRASOUND ABDOMEN COMPLETE  COMPARISON:  None.  FINDINGS: Gallbladder: No gallstones or wall thickening visualized. No sonographic Murphy sign noted.  Common bile duct: Diameter: 4.6 mm  Liver: No focal lesion identified. Within normal  limits in parenchymal echogenicity.  IVC: No abnormality visualized.  Pancreas: Visualized portion unremarkable.  Spleen: Size and appearance within normal limits.  Right Kidney: Length: 9.3 cm. Echogenicity within normal limits. No mass or hydronephrosis visualized.  Left Kidney: Length: 9.2 cm. Echogenicity within normal limits. No mass or hydronephrosis visualized.  Abdominal aorta: No aneurysm visualized.  Other findings: None.  IMPRESSION: Normal abdominal ultrasound.   Electronically Signed   By: Kathreen Devoid   On: 07/25/2014 09:28    Scheduled Meds: . aspirin  81 mg Oral Daily  . calcium-vitamin D  1 tablet Oral Q breakfast  . diltiazem  30 mg Oral 4 times per day  . enoxaparin (LOVENOX) injection  30 mg Subcutaneous Q24H  . metoprolol succinate  50 mg Oral Daily  . pantoprazole  40 mg Oral Daily  . predniSONE  5 mg Oral q morning - 10a  . sennosides-docusate sodium  1 tablet Oral QHS  . sodium chloride  3 mL Intravenous Q12H   Continuous Infusions:  Antibiotics Given (last 72 hours)    None      Principal Problem:   Atrial fibrillation with rapid ventricular response Active Problems:   CVA (cerebral infarction)-by MRI Feb 2015   HTN (hypertension)   PMR (polymyalgia rheumatica)   Hx of PAF- CHADS VASC=7 for age, sex, stroke, HTN   Pulmonary hypertension   Constipation   Dyspnea   Abdominal pain, right upper quadrant   CHF- presumably secondary to rapid AF - EF 65-70% Feb 2015    Time spent: 35 min    Eulogio Bear  [facility] Pager [phone] If 7PM-7AM, please contact night-coverage at [email or web address], password [facility] 07/25/2014, 10:50 AM  LOS: 1 day

## 2014-07-25 NOTE — Discharge Instructions (Signed)
Atrial Flutter °Atrial flutter is a heart rhythm that can cause the heart to beat very fast (tachycardia). It originates in the upper chambers of the heart (atria). In atrial flutter, the top chambers of the heart (atria) often beat much faster than the bottom chambers of the heart (ventricles). Atrial flutter has a regular "saw toothed" appearance in an EKG readout. An EKG is a test that records the electrical activity of the heart. Atrial flutter can cause the heart to beat up to 150 beats per minute (BPM). Atrial flutter can either be short lived (paroxysmal) or permanent.  °CAUSES  °Causes of atrial flutter can be many. Some of these include: °· Heart related issues: °¨ Heart attack (myocardial infarction). °¨ Heart failure. °¨ Heart valve problems. °¨ Poorly controlled high blood pressure (hypertension). °¨ After open heart surgery. °· Lung related issues: °¨ A blood clot in the lungs (pulmonary embolism). °¨ Chronic obstructive pulmonary disease (COPD). Medications used to treat COPD can attribute to atrial flutter. °· Other related causes: °¨ Hyperthyroidism. °¨ Caffeine. °¨ Some decongestant cold medications. °¨ Low electrolyte levels such as potassium or magnesium. °¨ Cocaine. °SYMPTOMS °· An awareness of your heart beating rapidly (palpitations). °· Shortness of breath. °· Chest pain. °· Low blood pressure (hypotension). °· Dizziness or fainting. °DIAGNOSIS  °Different tests can be performed to diagnose atrial flutter.  °· An EKG. °· Holter monitor. This is a 24-hour recording of your heart rhythm. You will also be given a diary. Write down all symptoms that you have and what you were doing at the time you experienced symptoms. °· Cardiac event monitor. This small device can be worn for up to 30 days. When you have heart symptoms, you will push a button on the device. This will then record your heart rhythm. °· Echocardiogram. This is an imaging test to look at your heart. Your caregiver will look at your  heart valves and the ventricles. °· Stress test. This test can help determine if the atrial flutter is related to exercise or if coronary artery disease is present. °· Laboratory studies will look at certain blood levels like: °¨ Complete blood count (CBC). °¨ Potassium. °¨ Magnesium. °¨ Thyroid function. °TREATMENT  °Treatment of atrial flutter varies. A combination of therapies may be used or sometimes atrial flutter may need only 1 type of treatment.  °Lab work: °If your blood work, such as your electrolytes (potassium, magnesium) or your thyroid function tests, are abnormal, your caregiver will treat them accordingly.  °Medication:  °There are several different types of medications that can convert your heart to a normal rhythm and prevent atrial flutter from reoccurring.  °Nonsurgical procedures: °Nonsurgical techniques may be used to control atrial flutter. Some examples include: °· Cardioversion. This technique uses either drugs or an electrical shock to restore a normal heart rhythm: °¨ Cardioversion drugs may be given through an intravenous (IV) line to help "reset" the heart rhythm. °¨ In electrical cardioversion, your caregiver shocks your heart with electrical energy. This helps to reset the heartbeat to a normal rhythm. °· Ablation. If atrial flutter is a persistent problem, an ablation may be needed. This procedure is done under mild sedation. High frequency radio-wave energy is used to destroy the area of heart tissue responsible for atrial flutter. °SEEK IMMEDIATE MEDICAL CARE IF:  °You have: °· Dizziness. °· Near fainting or fainting. °· Shortness of breath. °· Chest pain or pressure. °· Sudden nausea or vomiting. °· Profuse sweating. °If you have the above symptoms,   call your local emergency service immediately! Do not drive yourself to the hospital. MAKE SURE YOU:   Understand these instructions.  Will watch your condition.  Will get help right away if you are not doing well or get  worse. Document Released: 11/09/2008 Document Revised: 11/07/2013 Document Reviewed: 11/09/2008 [facility] Patient Information 2015 [place], [place]. This information is not intended to replace advice given to you by your health care provider. Make sure you discuss any questions you have with your health care provider.  Atrial Flutter Atrial flutter is a heart rhythm that can cause the heart to beat very fast (tachycardia). It originates in the upper chambers of the heart (atria). In atrial flutter, the top chambers of the heart (atria) often beat much faster than the bottom chambers of the heart (ventricles). Atrial flutter has a regular "saw toothed" appearance in an EKG readout. An EKG is a test that records the electrical activity of the heart. Atrial flutter can cause the heart to beat up to 150 beats per minute (BPM). Atrial flutter can either be short lived (paroxysmal) or permanent.  CAUSES  Causes of atrial flutter can be many. Some of these include:  Heart related issues:  Heart attack (myocardial infarction).  Heart failure.  Heart valve problems.  Poorly controlled high blood pressure (hypertension).  After open heart surgery.  Lung related issues:  A blood clot in the lungs (pulmonary embolism).  Chronic obstructive pulmonary disease (COPD). Medications used to treat COPD can attribute to atrial flutter.  Other related causes:  Hyperthyroidism.  Caffeine.  Some decongestant cold medications.  Low electrolyte levels such as potassium or magnesium.  Cocaine. SYMPTOMS  An awareness of your heart beating rapidly (palpitations).  Shortness of breath.  Chest pain.  Low blood pressure (hypotension).  Dizziness or fainting. DIAGNOSIS  Different tests can be performed to diagnose atrial flutter.   An EKG.  Holter monitor. This is a 24-hour recording of your heart rhythm. You will also be given a diary. Write down all symptoms that you have and what you were  doing at the time you experienced symptoms.  Cardiac event monitor. This small device can be worn for up to 30 days. When you have heart symptoms, you will push a button on the device. This will then record your heart rhythm.  Echocardiogram. This is an imaging test to look at your heart. Your caregiver will look at your heart valves and the ventricles.  Stress test. This test can help determine if the atrial flutter is related to exercise or if coronary artery disease is present.  Laboratory studies will look at certain blood levels like:  Complete blood count (CBC).  Potassium.  Magnesium.  Thyroid function. TREATMENT  Treatment of atrial flutter varies. A combination of therapies may be used or sometimes atrial flutter may need only 1 type of treatment.  Lab work: If your blood work, such as your electrolytes (potassium, magnesium) or your thyroid function tests, are abnormal, your caregiver will treat them accordingly.  Medication:  There are several different types of medications that can convert your heart to a normal rhythm and prevent atrial flutter from reoccurring.  Nonsurgical procedures: Nonsurgical techniques may be used to control atrial flutter. Some examples include:  Cardioversion. This technique uses either drugs or an electrical shock to restore a normal heart rhythm:  Cardioversion drugs may be given through an intravenous (IV) line to help "reset" the heart rhythm.  In electrical cardioversion, your caregiver shocks your heart with electrical  energy. This helps to reset the heartbeat to a normal rhythm.  Ablation. If atrial flutter is a persistent problem, an ablation may be needed. This procedure is done under mild sedation. High frequency radio-wave energy is used to destroy the area of heart tissue responsible for atrial flutter. SEEK IMMEDIATE MEDICAL CARE IF:  You have:  Dizziness.  Near fainting or fainting.  Shortness of breath.  Chest pain or  pressure.  Sudden nausea or vomiting.  Profuse sweating. If you have the above symptoms, call your local emergency service immediately! Do not drive yourself to the hospital. MAKE SURE YOU:   Understand these instructions.  Will watch your condition.  Will get help right away if you are not doing well or get worse. Document Released: 11/09/2008 Document Revised: 11/07/2013 Document Reviewed: 11/09/2008 [facility] Patient Information 2015 [place], [place]. This information is not intended to replace advice given to you by your health care provider. Make sure you discuss any questions you have with your health care provider.

## 2014-07-25 NOTE — Progress Notes (Signed)
Echocardiogram 2D Echocardiogram has been performed.  Joelene Millin 07/25/2014, 10:05 AM

## 2014-07-26 DIAGNOSIS — M353 Polymyalgia rheumatica: Secondary | ICD-10-CM

## 2014-07-26 DIAGNOSIS — I480 Paroxysmal atrial fibrillation: Principal | ICD-10-CM

## 2014-07-26 MED ORDER — DILTIAZEM HCL ER COATED BEADS 120 MG PO CP24: 120.0000 mg | ORAL_CAPSULE | Freq: Every day | ORAL | Status: DC

## 2014-07-26 MED ORDER — METOPROLOL SUCCINATE ER 50 MG PO TB24: 50.0000 mg | ORAL_TABLET | Freq: Every day | ORAL | Status: DC

## 2014-07-26 NOTE — Discharge Summary (Signed)
Physician Discharge Summary  Krystal Hensley GEX:[id number] DOB: [birth year] DOA: 07/24/2014  PCP: Krystal Argyle, MD  Admit date: 07/24/2014 Discharge date: 07/26/2014  Time spent: 35 minutes  Recommendations for Outpatient Follow-up:  1. Home health  Discharge Diagnoses:  Principal Problem:   Atrial fibrillation with rapid ventricular response Active Problems:   CVA (cerebral infarction)-by MRI Feb 2015   HTN (hypertension)   PMR (polymyalgia rheumatica)   Hx of PAF- CHADS VASC=7 for age, sex, stroke, HTN   Pulmonary hypertension   Constipation   Dyspnea   Abdominal pain, right upper quadrant   CHF- presumably secondary to rapid AF - EF 65-70% Feb 2015   Discharge Condition: improved  Diet recommendation: cardiac  Filed Weights   07/24/14 1621 07/25/14 0427 07/26/14 0449  Weight: 51.71 kg (114 lb) 49.76 kg (109 lb 11.2 oz) 50.712 kg (111 lb 12.8 oz)    History of present illness:  Patient is a 81 year old female with history of CVA, hypertension, paroxysmal atrial fibrillation, pulmonary hypertension, polymyalgia rheumatica, arthritis presented to ED with fatigue, shortness of breath with exertion, abdominal pain. Patient reported that she felt very short of breath earlier this morning, started last night, could not speak in full sentences. She also felt abdominal bloating and right upper quadrant abdominal pain. Patient reported that she was wearing a back support for chronic back pain, when she took it off she noticed epigastric and right upper quadrant abdominal pain. At the time of my examination, she had no abdominal tenderness and pain had resolved. She denied any chest pain but having dry coughing. Patient complained of generalized weakness otherwise no dizziness or lightheadedness or any syncopal episode. In ED, patient was noted to be in atrial fibrillation with RVR, heart rate 138, she was placed on Cardizem drip. Hospitalist service was requested for  admission. At the time of my encounter, heart rate had improved to 70s, patient converted to normal sinus rhythm. She reported that she had no shortness of breath at rest at the time of my examination,  Hospital Course:  AF with RVR- spontaneously converted in the ED, then reverted back into atrial fibrillation/flutter with RVR. Her Toprol XL was increased from 25-->50mg . Now spontaneously converted back into NSR.  -- CHADSVASC score at least 6 (CVA 2, HTN 1, age 76, F sex 1): however, not an anticoagulation candidate 2/2 high fall risk, (and her wishes to not pursue anticoagulation). (daughter calls her a "free bleeder") willing to discuss with Dr Meda Coffee as an outpatient. -- ContinueToprol 50 mg daily and cardizem 120mg   -- TSH normal  Acute on chronic diastolic CHF- BNP ~010 and CXR with pulm vasc congestion. -- EF 65-70% w/ G1DD, mild MR and PA pressure 52mmHG on ECHO 08/2013 -- Given 20mg  IV Lasix with good diuresis. Net neg 1.6L and weight down 3lbs (114--> 111 lbs). Breathing is much improved. -- Repeat 2D ECHO w/ Normal LV function (EF 65-70%) ; mild LVH; moderate LAE; mild MR, AI and TR;mildly elevated pulmonary pressure. -- Breathing back to baseline.   PMR  -- Continue prednisone  Abdominal pain, right upper quadrant -- Continue PPI, normal u/s   Procedures:  Echo  abd u/s  Consultations:  cardiology  Discharge Exam: Filed Vitals:   07/26/14 1112  BP: 130/69  Pulse: 74  Temp: 97.4 F (36.3 C)  Resp: 14    General: A+Ox3, feeling much better Cardiovascular: rrr Respiratory: clear  Discharge Instructions   Discharge Instructions    Diet - low sodium heart  healthy    Complete by:  As directed      Discharge instructions    Complete by:  As directed   Home health intermittent supervision     Increase activity slowly    Complete by:  As directed           Current Discharge Medication List    START taking these medications   Details  diltiazem  (CARDIZEM CD) 120 MG 24 hr capsule Take 1 capsule (120 mg total) by mouth daily. Qty: 30 capsule, Refills: 0      CONTINUE these medications which have CHANGED   Details  metoprolol succinate (TOPROL-XL) 50 MG 24 hr tablet Take 1 tablet (50 mg total) by mouth daily. Take with or immediately following a meal. Qty: 30 tablet, Refills: 0      CONTINUE these medications which have NOT CHANGED   Details  acetaminophen (TYLENOL) 500 MG tablet Take 1 tablet (500 mg total) by mouth every 6 (six) hours as needed. Qty: 30 tablet, Refills: 0    aspirin 81 MG chewable tablet Chew 1 tablet (81 mg total) by mouth daily.    calcium-vitamin D (OSCAL WITH D) 500-200 MG-UNIT per tablet Take 1 tablet by mouth daily with breakfast.    diclofenac (FLECTOR) 1.3 % PTCH Place 0.5 patches onto the skin 2 (two) times daily as needed (for pain).     docusate sodium (COLACE) 100 MG capsule Take 200 mg by mouth daily as needed.     Ibuprofen ([employer] IBUPROFEN) 200 MG CAPS Take 1 capsule (200 mg total) by mouth 2 (two) times daily. Qty: 120 each, Refills: 0   Associated Diagnoses: Precordial pain    omeprazole (PRILOSEC OTC) 20 MG tablet Take 20 mg by mouth daily.     predniSONE (DELTASONE) 5 MG tablet Take 5 mg by mouth every morning.     sennosides-docusate sodium (SENOKOT-S) 8.6-50 MG tablet Take 1 tablet by mouth at bedtime.      STOP taking these medications     isosorbide mononitrate (IMDUR) 30 MG 24 hr tablet      metoprolol tartrate (LOPRESSOR) 25 MG tablet      hydrochlorothiazide (HYDRODIURIL) 25 MG tablet        Allergies  Allergen Reactions  . Statins Other (See Comments)    myalgia   Follow-up Information    Follow up with [facility].   Specialty:  Home Health Services   Why:  [facility]   Contact information:   [place] [address] [phone]       Follow up with Krystal Argyle, MD In 1 week.    Specialty:  Internal Medicine   Contact information:   [address] [place] [address] [phone]       Follow up with Krystal Spark, MD In 2 weeks.   Specialty:  Cardiology   Contact information:   [place] [address] [phone]        The results of significant diagnostics from this hospitalization (including imaging, microbiology, ancillary and laboratory) are listed below for reference.    Significant Diagnostic Studies: Dg Chest 2 View  07/24/2014   CLINICAL DATA:  Shortness of breath.  EXAM: CHEST  2 VIEW  COMPARISON:  06/14/2014 and 05/08/2014  FINDINGS: Lungs are adequately inflated with subtle prominence of the perihilar markings likely mild vascular congestion. No focal consolidation or effusion. Cardiomediastinal silhouette  is unremarkable. There is calcified plaque over the thoracic aorta. Moderate stable biphasic curvature of the thoracolumbar spine. Diffuse decreased bone mineralization.  IMPRESSION: Findings suggesting mild vascular congestion.   Electronically Signed   By: Marin Olp M.D.   On: 07/24/2014 13:48   US Abdomen Complete  07/25/2014   CLINICAL DATA:  Abdominal pain  EXAM: ULTRASOUND ABDOMEN COMPLETE  COMPARISON:  None.  FINDINGS: Gallbladder: No gallstones or wall thickening visualized. No sonographic Murphy sign noted.  Common bile duct: Diameter: 4.6 mm  Liver: No focal lesion identified. Within normal limits in parenchymal echogenicity.  IVC: No abnormality visualized.  Pancreas: Visualized portion unremarkable.  Spleen: Size and appearance within normal limits.  Right Kidney: Length: 9.3 cm. Echogenicity within normal limits. No mass or hydronephrosis visualized.  Left Kidney: Length: 9.2 cm. Echogenicity within normal limits. No mass or hydronephrosis visualized.  Abdominal aorta: No aneurysm visualized.  Other findings: None.  IMPRESSION: Normal abdominal ultrasound.   Electronically Signed    By: Kathreen Devoid   On: 07/25/2014 09:28    Microbiology: No results found for this or any previous visit (from the past 240 hour(s)).   Labs: Basic Metabolic Panel:  Recent Labs Lab 07/24/14 1218 07/25/14 0340  NA 138 140  K 4.7 4.0  CL 105 105  CO2 23 27  GLUCOSE 124* 87  BUN 10 9  CREATININE 0.71 0.72  CALCIUM 9.0 8.8   Liver Function Tests:  Recent Labs Lab 07/24/14 1218  AST 112*  ALT 89*  ALKPHOS 113  BILITOT 0.8  PROT 6.0  ALBUMIN 3.5    Recent Labs Lab 07/24/14 1218  LIPASE 28   No results for input(s): AMMONIA in the last 168 hours. CBC:  Recent Labs Lab 07/24/14 1137 07/25/14 0340  WBC 11.0* 7.8  NEUTROABS 9.9*  --   HGB 14.2 13.8  HCT 42.4 41.7  MCV 98.4 98.3  PLT 223 205   Cardiac Enzymes:  Recent Labs Lab 07/24/14 1137 07/24/14 1705 07/24/14 2227 07/25/14 0340  TROPONINI <0.03 <0.03 <0.03 <0.03   BNP: BNP (last 3 results)  Recent Labs  01/14/14 1039  PROBNP 1065.0*   CBG: No results for input(s): GLUCAP in the last 168 hours.     SignedEulogio Bear  [facility] 07/26/2014, 11:17 AM

## 2014-07-26 NOTE — Progress Notes (Signed)
Pt discharged to home via wheelchair, accompanied by daughter, condition stable.

## 2014-07-26 NOTE — Progress Notes (Signed)
Patient Name: Krystal Hensley Date of Encounter: 07/26/2014     Principal Problem:   Atrial fibrillation with rapid ventricular response Active Problems:   CVA (cerebral infarction)-by MRI Feb 2015   HTN (hypertension)   PMR (polymyalgia rheumatica)   Hx of PAF- CHADS VASC=7 for age, sex, stroke, HTN   Pulmonary hypertension   Constipation   Dyspnea   Abdominal pain, right upper quadrant   CHF- presumably secondary to rapid AF - EF 65-70% Feb 2015    SUBJECTIVE  Feeling well. Ready to go home. Breathing back to baseline. Yesterday had a couple episodes of SOB but none today.   CURRENT MEDS . aspirin  81 mg Oral Daily  . calcium-vitamin D  1 tablet Oral Q breakfast  . diltiazem  30 mg Oral 4 times per day  . enoxaparin (LOVENOX) injection  30 mg Subcutaneous Q24H  . metoprolol succinate  50 mg Oral Daily  . pantoprazole  40 mg Oral Daily  . predniSONE  5 mg Oral q morning - 10a  . sennosides-docusate sodium  1 tablet Oral QHS  . sodium chloride  3 mL Intravenous Q12H    OBJECTIVE  Filed Vitals:   07/25/14 2047 07/26/14 0003 07/26/14 0449 07/26/14 0721  BP: 96/75 142/73 145/81 137/66  Pulse: 78 80 80 69  Temp: 97.4 F (36.3 C) 97.6 F (36.4 C) 98 F (36.7 C) 97.7 F (36.5 C)  TempSrc: Oral Oral Oral Oral  Resp: 20 20 18 16   Height:      Weight:   111 lb 12.8 oz (50.712 kg)   SpO2: 99% 97% 95% 97%    Intake/Output Summary (Last 24 hours) at 07/26/14 0908 Last data filed at 07/26/14 0803  Gross per 24 hour  Intake   1100 ml  Output    500 ml  Net    600 ml   Filed Weights   07/24/14 1621 07/25/14 0427 07/26/14 0449  Weight: 114 lb (51.71 kg) 109 lb 11.2 oz (49.76 kg) 111 lb 12.8 oz (50.712 kg)    PHYSICAL EXAM  General: Pleasant, NAD. Elderly and frail Neuro: Alert and oriented X 3. Moves all extremities spontaneously. Psych: Normal affect. HEENT:  Normal  Neck: Supple without bruits or JVD. Lungs:  Resp regular and unlabored, CTA. Heart: RRR  no s3, s4, or murmurs. Abdomen: Soft, non-tender, non-distended, BS + x 4.  Extremities: No clubbing, cyanosis or edema. DP/PT/Radials 2+ and equal bilaterally.  Accessory Clinical Findings  CBC  Recent Labs  07/24/14 1137 07/25/14 0340  WBC 11.0* 7.8  NEUTROABS 9.9*  --   HGB 14.2 13.8  HCT 42.4 41.7  MCV 98.4 98.3  PLT 223 497   Basic Metabolic Panel  Recent Labs  07/24/14 1218 07/25/14 0340  NA 138 140  K 4.7 4.0  CL 105 105  CO2 23 27  GLUCOSE 124* 87  BUN 10 9  CREATININE 0.71 0.72  CALCIUM 9.0 8.8   Liver Function Tests  Recent Labs  07/24/14 1218  AST 112*  ALT 89*  ALKPHOS 113  BILITOT 0.8  PROT 6.0  ALBUMIN 3.5    Recent Labs  07/24/14 1218  LIPASE 28   Cardiac Enzymes  Recent Labs  07/24/14 1705 07/24/14 2227 07/25/14 0340  TROPONINI <0.03 <0.03 <0.03   Thyroid Function Tests  Recent Labs  07/24/14 1705  TSH 1.076    TELE NSR  Radiology/Studies  Dg Chest 2 View  07/24/2014   CLINICAL DATA:  Shortness  of breath.  EXAM: CHEST  2 VIEW  COMPARISON:  06/14/2014 and 05/08/2014  FINDINGS: Lungs are adequately inflated with subtle prominence of the perihilar markings likely mild vascular congestion. No focal consolidation or effusion. Cardiomediastinal silhouette is unremarkable. There is calcified plaque over the thoracic aorta. Moderate stable biphasic curvature of the thoracolumbar spine. Diffuse decreased bone mineralization.  IMPRESSION: Findings suggesting mild vascular congestion.   Electronically Signed   By: Marin Olp M.D.   On: 07/24/2014 13:48   US Abdomen Complete  07/25/2014   CLINICAL DATA:  Abdominal pain  EXAM: ULTRASOUND ABDOMEN COMPLETE  COMPARISON:  None.  FINDINGS: Gallbladder: No gallstones or wall thickening visualized. No sonographic Murphy sign noted.  Common bile duct: Diameter: 4.6 mm  Liver: No focal lesion identified. Within normal limits in parenchymal echogenicity.  IVC: No abnormality visualized.   Pancreas: Visualized portion unremarkable.  Spleen: Size and appearance within normal limits.  Right Kidney: Length: 9.3 cm. Echogenicity within normal limits. No mass or hydronephrosis visualized.  Left Kidney: Length: 9.2 cm. Echogenicity within normal limits. No mass or hydronephrosis visualized.  Abdominal aorta: No aneurysm visualized.  Other findings: None.  IMPRESSION: Normal abdominal ultrasound.   Electronically Signed   By: Kathreen Devoid   On: 07/25/2014 09:28   2D ECHO Study Date: 07/25/2014 LV EF: 65% -  70% Study Conclusions - Left ventricle: The cavity size was normal. Wall thickness was increased in a pattern of mild LVH. There was mild focal basal hypertrophy of the septum. Systolic function was vigorous. The estimated ejection fraction was in the range of 65% to 70%. Wall motion was normal; there were no regional wall motion abnormalities. - Aortic valve: There was mild regurgitation. - Mitral valve: Calcified annulus. There was mild regurgitation. - Left atrium: The atrium was moderately dilated. - Pulmonary arteries: Systolic pressure was mildly increased. Impressions: - Normal LV function; mild LVH; moderate LAE; mild MR, AI and TR; mildly elevated pulmonary pressure.    ASSESSMENT AND PLAN  Krystal Hensley is a 81 y.o. female with a history of PAF, HTN, prior CVA, and PMR who presented to [facility] ED 07/24/14 with dyspnea and found to be in AF with RVR. Additionally, her BNP was noted to be elevated and her CXR suggests vascular congestion and cardiology was consulted.  AF with RVR- spontaneously converted in the ED, then reverted back into atrial fibrillation/flutter with RVR. Her Toprol XL was increased from 25-->50mg . Now spontaneously converted back into NSR.  -- CHADSVASC score at least 6 (CVA 2, HTN 1, age 14, F sex 1): however, not an anticoagulation candidate 2/2 high fall risk, (and her wishes to not pursue anticoagulation). However, she is willing to  discuss with Dr Meda Coffee as an outpatient. -- ContinueToprol 50 mg daily and dilt 30mg  QID. Will switch to cardizem 120mg   -- TSH normal  Acute on chronic diastolic CHF- BNP ~956 and CXR with pulm vasc congestion. -- EF 65-70% w/ G1DD, mild MR and PA pressure 13mmHG on ECHO 08/2013 -- Given 20mg  IV Lasix with good diuresis. Net neg 1.6L and weight down 3lbs (114--> 111 lbs). Breathing is much improved. -- Repeat 2D ECHO w/  Normal LV function (EF 65-70%) ; mild LVH; moderate LAE; mild MR, AI and TR;mildly elevated pulmonary pressure. -- Breathing back to baseline.   PMR  -- Continue prednisone  Abdominal pain, right upper quadrant -- Continue PPI, normal u/s   Judy Pimple PA-C  Pager [phone]  History and all  data above reviewed.  Patient examined.  I agree with the findings as above.  The patient has no acute complaints.  Now back in NSR. The patient exam reveals COR:RRR  ,  Lungs: Clear  ,  Abd: Positive bowel sounds, no rebound no guarding, Ext No edema  .  All available labs, radiology testing, previous records reviewed. Agree with documented assessment and plan. Atrial fib:  Back in NSR.  Consolidate Cardizem.  She will follow with Dr. Meda Coffee in our office.  We will move up her appt.    Jeneen Rinks Princetta Uplinger  10:40 AM  07/26/2014

## 2014-07-27 ENCOUNTER — Other Ambulatory Visit: Payer: Medicare Other

## 2014-07-31 ENCOUNTER — Ambulatory Visit: Payer: Medicare Other | Admitting: Podiatry

## 2014-08-11 ENCOUNTER — Encounter: Payer: Self-pay | Admitting: Physician Assistant

## 2014-08-11 ENCOUNTER — Ambulatory Visit (INDEPENDENT_AMBULATORY_CARE_PROVIDER_SITE_OTHER): Payer: Medicare Other | Admitting: Physician Assistant

## 2014-08-11 VITALS — BP 130/78 | HR 77 | Ht 61.0 in | Wt 112.0 lb

## 2014-08-11 DIAGNOSIS — R079 Chest pain, unspecified: Secondary | ICD-10-CM | POA: Diagnosis not present

## 2014-08-11 DIAGNOSIS — I5032 Chronic diastolic (congestive) heart failure: Secondary | ICD-10-CM

## 2014-08-11 DIAGNOSIS — M353 Polymyalgia rheumatica: Secondary | ICD-10-CM

## 2014-08-11 DIAGNOSIS — I480 Paroxysmal atrial fibrillation: Secondary | ICD-10-CM | POA: Diagnosis not present

## 2014-08-11 DIAGNOSIS — I4891 Unspecified atrial fibrillation: Secondary | ICD-10-CM

## 2014-08-11 DIAGNOSIS — I272 Pulmonary hypertension, unspecified: Secondary | ICD-10-CM

## 2014-08-11 DIAGNOSIS — I270 Primary pulmonary hypertension: Secondary | ICD-10-CM

## 2014-08-11 DIAGNOSIS — I10 Essential (primary) hypertension: Secondary | ICD-10-CM

## 2014-08-11 DIAGNOSIS — K5900 Constipation, unspecified: Secondary | ICD-10-CM

## 2014-08-11 MED ORDER — METOPROLOL SUCCINATE ER 50 MG PO TB24: 50.0000 mg | ORAL_TABLET | Freq: Every day | ORAL | Status: DC

## 2014-08-11 MED ORDER — DILTIAZEM HCL ER COATED BEADS 120 MG PO CP24: 120.0000 mg | ORAL_CAPSULE | Freq: Every day | ORAL | Status: DC

## 2014-08-11 NOTE — Progress Notes (Signed)
Cardiology Office Note Date:  08/11/2014  Patient ID:  Krystal Hensley, DOB [birth year], MRN [id number] PCP:  [name], MD  Cardiologist:  Meda Coffee   Chief Complaint: here for follow-up of atrial fibrillation; also noting constipation  History of Present Illness: Krystal Hensley is a 81 y.o. female with history of CVA, HTN, PAF (not on anticoagulation due to fall risk/advanced age/patient choice), PMR, arthritis, pulm HTN, chronic diastolic CHF who presents for post-hospital follow-up. She was admitted to [facility] 1/18-1/20 with SOB, abdominal pain and RQU pain. She was found to be in AF with RVR, converted in the ED, then reverted back into atrial fibrillation/flutter with RVR. Metoprolol was increased and she returned to normal rhythm. Abd [place] normal. Troponins were normal. Labwork otherwise grossly unremarkable including normal TSH, normal Hgb, BMET OK. She also received low dose IV Lasix for acute on chronic diastolic CHF with improvement in breathing. 2D echo 07/25/14: mild LVH, mild focal basal hypertrophy of septum, EF 65-70%, no RWMA, mildly increased PASP, moderate LAE, mild MR, AI and TR. There was mention per hospital notes to revisit the topic of anticoagulation as an outpatient.  She has been doing well since hospital discharge. She is remarkably sharp mentally. She has been working with PT to increase strength at home. No recent falls, but she remains leery of starting a blood thinner. She reports that she's an easy bleeder if she injures or scratches herself. She denies orthopnea, LEE, PND, SOB, or chest pain. She is in NSR today. She requests refills on her metoprolol and diltiazem.  Her salient complaint is that of constipation which she wonders is related to her heart meds. Prune juice doesn't seem to help. She tried Miralax but it made her go too much. She is taking Senna nightly but now has inconsistency in constipation alternating with urgency to go. No bleeding.  Past  Medical History  Diagnosis Date  . Hypertension   . Arthritis   . PAF (paroxysmal atrial fibrillation)   . Back pain   . Colon cancer   . Polymyalgia rheumatica   . Hypoglycemia   . HTN (hypertension) 08/08/2013  . Stroke   . Chronic diastolic CHF (congestive heart failure)   . Pulmonary hypertension     a. Echo 07/2014 - mildly increased PASP.    Past Surgical History  Procedure Laterality Date  . Colon surgery    . Cesarean section    . Bilateral oophorectomy    . Eye surgery      Current Outpatient Prescriptions  Medication Sig Dispense Refill  . acetaminophen (TYLENOL) 500 MG tablet Take 1 tablet (500 mg total) by mouth every 6 (six) hours as needed. (Patient taking differently: Take 500 mg by mouth every 6 (six) hours as needed for moderate pain. ) 30 tablet 0  . aspirin 81 MG chewable tablet Chew 1 tablet (81 mg total) by mouth daily.    . calcium-vitamin D (OSCAL WITH D) 500-200 MG-UNIT per tablet Take 1 tablet by mouth daily with breakfast.    . diclofenac (FLECTOR) 1.3 % PTCH Place 0.5 patches onto the skin 2 (two) times daily as needed (for pain).     Marland Kitchen diltiazem (CARDIZEM CD) 120 MG 24 hr capsule Take 1 capsule (120 mg total) by mouth daily. 30 capsule 0  . docusate sodium (COLACE) 100 MG capsule Take 200 mg by mouth daily as needed.     . Ibuprofen ([employer]S IBUPROFEN) 200 MG CAPS Take 1 capsule (200 mg total)  by mouth 2 (two) times daily. (Patient taking differently: Take 200 mg by mouth 2 (two) times daily as needed (for pain). ) 120 each 0  . metoprolol succinate (TOPROL-XL) 50 MG 24 hr tablet Take 1 tablet (50 mg total) by mouth daily. Take with or immediately following a meal. 30 tablet 0  . omeprazole (PRILOSEC OTC) 20 MG tablet Take 20 mg by mouth daily.     . predniSONE (DELTASONE) 5 MG tablet Take 5 mg by mouth every morning.     . sennosides-docusate sodium (SENOKOT-S) 8.6-50 MG tablet Take 1 tablet by mouth at bedtime.     No current facility-administered  medications for this visit.    Allergies:   Statins   Social History:  The patient  reports that she has never smoked. She does not have any smokeless tobacco history on file. She reports that she does not drink alcohol or use illicit drugs.   Family History:  The patient's family history includes Aneurysm in her mother; Heart attack in her father.  ROS:  Please see the history of present illness.  All other systems are reviewed and otherwise negative.   PHYSICAL EXAM: VS:  BP 130/78 mmHg  Pulse 77  Ht 5\' 1"  (1.549 m)  Wt 112 lb (50.803 kg)  BMI 21.17 kg/m2 BMI: Body mass index is 21.17 kg/(m^2). Well developed, thin WF in no acute distress - I saw her ambulating into the clinic without difficulty. HEENT: normocephalic, atraumatic Neck: no JVD Cardiac:  normal S1, S2; RRR; no murmur Lungs:  clear to auscultation bilaterally, no wheezing, rhonchi or rales Abd: soft, nontender, no hepatomegaly Ext: no edema Skin: warm and dry Neuro:  moves all extremities spontaneously, no focal abnormalities noted  EKG: NSR 77bpm, septal infarct age undetermined, nonspecific T wave changes in I, avL, V6. No acute change from prior.  Recent Labs: 01/14/2014: Pro B Natriuretic peptide (BNP) 1065.0* 07/24/2014: ALT 89*; B Natriuretic Peptide 581.3*; TSH 1.076 07/25/2014: BUN 9; Creatinine 0.72; Hemoglobin 13.8; Platelets 205; Potassium 4.0; Sodium 140  No results found for requested labs within last 365 days.   Estimated Creatinine Clearance: 32.4 mL/min (by C-G formula based on Cr of 0.72).   Wt Readings from Last 3 Encounters:  08/11/14 112 lb (50.803 kg)  07/26/14 111 lb 12.8 oz (50.712 kg)  06/28/14 114 lb 1.9 oz (51.764 kg)     Other studies reviewed: Additional studies/records reviewed today include: hospital records above.  ASSESSMENT AND PLAN:  1. Paroxysmal atrial fibrillation - maintaining NSR. Continue metoprolol and diltiazem. Hospital notes indicated a desire to revisit the idea  of anticoagulation. CHADSVASC score is 7 thus she is at high risk for stroke. I think she would be a candidate for Eliquis 2.5mg  BID, especially since she has tolerated aspirin without difficulty. However, she is leery of the risk of bleeding and her history of falling in the past which is a very real possibility. She does not want to make a decision about this right now. She understands that she has a high risk of stroke without anticoagulation. She will continue to work with PT to increase her strength. She ambulated into clinic today without gait instability. She will let us know if she decides to give anticoagulation a try, at which time I would run it by Dr. Meda Coffee to make sure she agrees. If we start do eventually start a NOAC, would discontinue aspirin at that time. 2. Chronic diastolic CHF - this occurred in the context of AF-RVR.  Currently appears euvolemic. Salt/fluid restriction discussed. Her home health team is weighing her daily with stable results recently. 3. Essential HTN - controlled. 4. Pulm HTN - PASP mildly elevated by recent echo. Follow clinically. 5. PMR - on chronic prednisone for this. 6. Constipation - I advised she change her Colace (which was PRN) to daily, and to change her Senna (which was daily) to PRN. Hopefully this will soften stools, easing constipation, while reducing the urgency she was experiencing. Will defer further management to primary doctor.  Disposition: F/u with Dr. Meda Coffee in 2-3 months. She inquired about rx for occasional anxiety and I asked her to discuss with PCP since we don't typically follow this.  Current medicines are reviewed at length with the patient today.  The patient did not have any concerns regarding medicines.  Raechel Ache PA-C 08/11/2014 10:01 AM     [facility] [place] [address] [phone] (office)  [phone] (fax)

## 2014-08-11 NOTE — Patient Instructions (Addendum)
Your physician has recommended you make the following change in your medication:   START TAKING COLACE 200 MG DAILY  START TAKING SENNA AS NEEDED    REFILLS FOR METOPROLOL AND DILTIAZEM SENT INTO PHARMACY     FOLLOW UP WITH DR Meda Coffee IN 2 TO 3  MONTHS

## 2014-08-14 ENCOUNTER — Ambulatory Visit (INDEPENDENT_AMBULATORY_CARE_PROVIDER_SITE_OTHER): Payer: Medicare Other | Admitting: Podiatry

## 2014-08-14 ENCOUNTER — Encounter: Payer: Self-pay | Admitting: Podiatry

## 2014-08-14 DIAGNOSIS — B351 Tinea unguium: Secondary | ICD-10-CM

## 2014-08-14 DIAGNOSIS — M79676 Pain in unspecified toe(s): Secondary | ICD-10-CM

## 2014-08-14 NOTE — Patient Instructions (Signed)
Wear sandal on left foot Apply topical antibiotic ointment between the toe and second toe twice a day until healed

## 2014-08-15 ENCOUNTER — Telehealth: Payer: Self-pay | Admitting: Cardiology

## 2014-08-15 MED ORDER — DILTIAZEM HCL ER COATED BEADS 120 MG PO CP24: 120.0000 mg | ORAL_CAPSULE | Freq: Every day | ORAL | Status: DC

## 2014-08-15 MED ORDER — METOPROLOL SUCCINATE ER 50 MG PO TB24: 50.0000 mg | ORAL_TABLET | Freq: Every day | ORAL | Status: DC

## 2014-08-15 NOTE — Progress Notes (Signed)
Patient ID: Krystal Hensley, female   DOB: [birth year], 81 y.o.   MRN: [id number]  Subjective: Patient presents complaining of painful toenails her request debridement Patient is noticed bleeding from the left foot today  Objective: Overlapping left hallux with fissure in the first web space which is a source of bleeding The toenails are elongated, brittle, incurvated, hypertrophic and tender to palpation  Assessment: Fissure first left web space without clinical sign of infection Symptomatic onychomycoses 6-10  Plan: Debrided toenails 10 without a bleeding Issued advised to apply topical antibiotic ointment between the first and second toes daily until healed and wear a soft shoe or sandal

## 2014-08-15 NOTE — Telephone Encounter (Signed)
New Msg      Pt c/o medication issue:  1. Name of Medication: Cardizem 120 mg and Metoprolol 50mg   2. How are you currently taking this medication (dosage and times per day)? Pt takes both once a day   3. Are you having a reaction (difficulty breathing--STAT)?No   4. What is your medication issue? Pt pharmacy states they do not have a new prescription for these meds.   Pt daughter Randell Patient calling, states pt is almost out of her meds and is becoming very worried and anxious.   Pharmacy is stating no medication requests have been sent so they will not fill meds.  Please call daughter once complete.

## 2014-08-15 NOTE — Telephone Encounter (Signed)
Notified the pts daughter Randell Patient that I sent refills for the Cardizem 120 mg and Toprol XL 50 mg to pts pharmacy of choice for a 90 day supply.  Daughter verbalized understanding and very gracious for all the assistance provided.

## 2014-09-19 ENCOUNTER — Ambulatory Visit: Payer: Medicare Other | Admitting: Cardiology

## 2014-10-09 ENCOUNTER — Encounter: Payer: Self-pay | Admitting: Cardiology

## 2014-10-09 ENCOUNTER — Ambulatory Visit (INDEPENDENT_AMBULATORY_CARE_PROVIDER_SITE_OTHER): Payer: Medicare Other | Admitting: Cardiology

## 2014-10-09 VITALS — BP 152/82 | HR 82 | Ht 61.0 in | Wt 114.0 lb

## 2014-10-09 DIAGNOSIS — I5032 Chronic diastolic (congestive) heart failure: Secondary | ICD-10-CM | POA: Diagnosis not present

## 2014-10-09 DIAGNOSIS — I10 Essential (primary) hypertension: Secondary | ICD-10-CM

## 2014-10-09 DIAGNOSIS — I480 Paroxysmal atrial fibrillation: Secondary | ICD-10-CM | POA: Diagnosis not present

## 2014-10-09 NOTE — Patient Instructions (Signed)
Your physician has recommended you make the following change in your medication:   DR NELSON HAS RECOMMENDED THAT YOU PURCHASE 0.5 % HYDROCORTISONE CREAM OVER THE COUNTER FOR YOUR RASH    Your physician wants you to follow-up in: 3 MONTHS WITH DR Johann Capers will receive a reminder letter in the mail two months in advance. If you don't receive a letter, please call our office to schedule the follow-up appointment.

## 2014-10-09 NOTE — Progress Notes (Signed)
Patient ID: Krystal Hensley, female   DOB: May 09, 1918, 81 y.o.   MRN: [id number]      Cardiology Office Note Date:  10/09/2014  Patient ID:  Krystal Hensley, DOB 1917-07-13, MRN [id number] PCP:  [name], MD  Cardiologist:  Meda Coffee   Chief Complaint: here for follow-up of atrial fibrillation  History of Present Illness: Krystal Hensley is a 81 y.o. female with history of CVA, HTN, PAF (not on anticoagulation due to fall risk/advanced age/patient choice), PMR, arthritis, pulm HTN, chronic diastolic CHF who presents for post-hospital follow-up. She was admitted to [facility] 1/18-1/20 with SOB, abdominal pain and RQU pain. She was found to be in AF with RVR, converted in the ED, then reverted back into atrial fibrillation/flutter with RVR. Metoprolol was increased and she returned to normal rhythm. Abd [place] normal. Troponins were normal. Labwork otherwise grossly unremarkable including normal TSH, normal Hgb, BMET OK. She also received low dose IV Lasix for acute on chronic diastolic CHF with improvement in breathing. 2D echo 07/25/14: mild LVH, mild focal basal hypertrophy of septum, EF 65-70%, no RWMA, mildly increased PASP, moderate LAE, mild MR, AI and TR. There was mention per hospital notes to revisit the topic of anticoagulation as an outpatient.  She has been doing well since hospital discharge. She is remarkably sharp mentally. She has been working with PT to increase strength at home. No recent falls, but she remains leery of starting a blood thinner. She reports that she's an easy bleeder if she injures or scratches herself. She denies orthopnea, LEE, PND, SOB, or chest pain. She is in NSR today. She requests refills on her metoprolol and diltiazem.  Her salient complaint is that of constipation which she wonders is related to her heart meds. Prune juice doesn't seem to help. She tried Miralax but it made her go too much. She is taking Senna nightly but now has inconsistency in  constipation alternating with urgency to go. No bleeding.  10/09/2014 - the patient is coming after 2 months, she feels well and denies any palpitations or falls. She walks with a cane. She denied anticoagulation at the last visit. Her concern is feeling tired and having slow starts in the mornings, this started after her stroke. She feels some SOB early in the mornings as well that resolves after taking a few deep breaths. No CP or DOE, no LE edema.     Past Medical History  Diagnosis Date  . Hypertension   . Arthritis   . PAF (paroxysmal atrial fibrillation)   . Back pain   . Colon cancer   . Polymyalgia rheumatica   . Hypoglycemia   . HTN (hypertension) 08/08/2013  . Stroke   . Chronic diastolic CHF (congestive heart failure)   . Pulmonary hypertension     a. Echo 07/2014 - mildly increased PASP.    Past Surgical History  Procedure Laterality Date  . Colon surgery    . Cesarean section    . Bilateral oophorectomy    . Eye surgery      Current Outpatient Prescriptions  Medication Sig Dispense Refill  . acetaminophen (TYLENOL) 500 MG tablet Take 1 tablet (500 mg total) by mouth every 6 (six) hours as needed. (Patient taking differently: Take 500 mg by mouth every 6 (six) hours as needed for moderate pain. ) 30 tablet 0  . aspirin 81 MG chewable tablet Chew 1 tablet (81 mg total) by mouth daily.    . calcium-vitamin D (OSCAL WITH D)  500-200 MG-UNIT per tablet Take 1 tablet by mouth daily with breakfast.    . diclofenac (FLECTOR) 1.3 % PTCH Place 0.5 patches onto the skin 2 (two) times daily as needed (for pain).     Marland Kitchen diltiazem (CARDIZEM CD) 120 MG 24 hr capsule Take 1 capsule (120 mg total) by mouth daily. 30 capsule 0  . docusate sodium (COLACE) 100 MG capsule Take 200 mg by mouth daily as needed.     . Ibuprofen ([employer] IBUPROFEN) 200 MG CAPS Take 1 capsule (200 mg total) by mouth 2 (two) times daily. (Patient taking differently: Take 200 mg by mouth 2 (two) times daily as needed  (for pain). ) 120 each 0  . metoprolol succinate (TOPROL-XL) 50 MG 24 hr tablet Take 1 tablet (50 mg total) by mouth daily. Take with or immediately following a meal. 30 tablet 0  . omeprazole (PRILOSEC OTC) 20 MG tablet Take 20 mg by mouth daily.     . predniSONE (DELTASONE) 5 MG tablet Take 5 mg by mouth every morning.     . sennosides-docusate sodium (SENOKOT-S) 8.6-50 MG tablet Take 1 tablet by mouth at bedtime.     No current facility-administered medications for this visit.    Allergies:   Statins   Social History:  The patient  reports that she has never smoked. She does not have any smokeless tobacco history on file. She reports that she does not drink alcohol or use illicit drugs.   Family History:  The patient's family history includes Aneurysm in her mother; Heart attack in her father.  ROS:  Please see the history of present illness.  All other systems are reviewed and otherwise negative.   PHYSICAL EXAM: VS:  BP 152/82 mmHg  Pulse 82  Ht 5\' 1"  (1.549 m)  Wt 114 lb (51.71 kg)  BMI 21.55 kg/m2  SpO2 96% BMI: Body mass index is 21.55 kg/(m^2). Well developed, thin WF in no acute distress - I saw her ambulating into the clinic without difficulty. HEENT: normocephalic, atraumatic Neck: no JVD Cardiac:  normal S1, S2; RRR; no murmur Lungs:  clear to auscultation bilaterally, no wheezing, rhonchi or rales Abd: soft, nontender, no hepatomegaly Ext: no edema Skin: warm and dry Neuro:  moves all extremities spontaneously, no focal abnormalities noted  EKG: NSR 77bpm, septal infarct age undetermined, nonspecific T wave changes in I, avL, V6. No acute change from prior.  Recent Labs: 01/14/2014: Pro B Natriuretic peptide (BNP) 1065.0* 07/24/2014: ALT 89*; B Natriuretic Peptide 581.3*; TSH 1.076 07/25/2014: BUN 9; Creatinine 0.72; Hemoglobin 13.8; Platelets 205; Potassium 4.0; Sodium 140  No results found for requested labs within last 365 days.   CrCl cannot be calculated  (Patient has no serum creatinine result on file.).   Wt Readings from Last 3 Encounters:  10/09/14 114 lb (51.71 kg)  08/11/14 112 lb (50.803 kg)  07/26/14 111 lb 12.8 oz (50.712 kg)    Other studies reviewed: Additional studies/records reviewed today include: hospital records above.   ASSESSMENT AND PLAN:  1. Paroxysmal atrial fibrillation - maintaining NSR. Continue metoprolol and diltiazem. Hospital notes indicated a desire to revisit the idea of anticoagulation. CHADSVASC score is 7 thus she is at high risk for stroke. She refused at the last visit. She thought about it and is concerned about bleeding and risk of falls at her age. I think that it is reasonable to continue ASA only especially since the patient is very reluctant to start anticoagulation.  2. Chronic diastolic  CHF - she appears euvolemic, she is concerned abot 1 lbs weight loss, but she is reassured that is ok and advised to keep following and call us if there is 3 lbs weight gain overnight or 5 lbs in 1 week.   3. Essential HTN - rechecked controlled. 4. Pulm HTN - PASP mildly elevated by recent echo. Follow clinically. 5. PMR - on chronic prednisone for this.   Follow up in 3 months.   Current medicines are reviewed at length with the patient today.  The patient did not have any concerns regarding medicines.  Corliss Blacker, MD  10/09/2014 11:41 AM     [address] [place] [address] [phone] (office)  [phone] (fax)

## 2014-11-20 ENCOUNTER — Ambulatory Visit (INDEPENDENT_AMBULATORY_CARE_PROVIDER_SITE_OTHER): Payer: Medicare Other | Admitting: Podiatry

## 2014-11-20 DIAGNOSIS — M79676 Pain in unspecified toe(s): Secondary | ICD-10-CM

## 2014-11-20 DIAGNOSIS — B351 Tinea unguium: Secondary | ICD-10-CM | POA: Diagnosis not present

## 2014-11-20 NOTE — Patient Instructions (Signed)
If the shoe rubs the top of the left big toe, make a slit in the shoe over the left big toe to relieve pressure

## 2014-11-21 NOTE — Progress Notes (Signed)
Patient ID: Krystal Hensley, female   DOB: [birth year], 81 y.o.   MRN: [id number]  Subjective: This patient presents again complaining of painful toenails and requesting nail debridement  Objective: Overlapping left hallux The toenails are elongated, brittle, incurvated, hypertrophic and tender direct palpation 6-10  Assessment: Symptomatic onychomycoses 6-10  Plan: Debridement toenails 10 without any bleeding Advised patient to wear soft shoes, sandals or cut shoe out over left hallux  Reappoint 3 months

## 2014-12-07 ENCOUNTER — Emergency Department (HOSPITAL_COMMUNITY)
Admission: EM | Admit: 2014-12-07 | Discharge: 2014-12-07 | Disposition: A | Discharge status: Home / Self Care | Payer: Medicare Other | Attending: Emergency Medicine | Admitting: Emergency Medicine

## 2014-12-07 ENCOUNTER — Ambulatory Visit (INDEPENDENT_AMBULATORY_CARE_PROVIDER_SITE_OTHER): Payer: Medicare Other | Admitting: Physician Assistant

## 2014-12-07 ENCOUNTER — Emergency Department (HOSPITAL_COMMUNITY): Payer: Medicare Other

## 2014-12-07 ENCOUNTER — Encounter (HOSPITAL_COMMUNITY): Payer: Self-pay | Admitting: Emergency Medicine

## 2014-12-07 VITALS — BP 158/96 | HR 92 | Temp 97.8°F | Resp 18 | Ht 61.0 in | Wt 115.8 lb

## 2014-12-07 DIAGNOSIS — I4891 Unspecified atrial fibrillation: Secondary | ICD-10-CM | POA: Diagnosis not present

## 2014-12-07 DIAGNOSIS — R200 Anesthesia of skin: Secondary | ICD-10-CM | POA: Diagnosis not present

## 2014-12-07 DIAGNOSIS — M1990 Unspecified osteoarthritis, unspecified site: Secondary | ICD-10-CM | POA: Diagnosis not present

## 2014-12-07 DIAGNOSIS — Z85038 Personal history of other malignant neoplasm of large intestine: Secondary | ICD-10-CM | POA: Diagnosis not present

## 2014-12-07 DIAGNOSIS — Z8673 Personal history of transient ischemic attack (TIA), and cerebral infarction without residual deficits: Secondary | ICD-10-CM | POA: Diagnosis not present

## 2014-12-07 DIAGNOSIS — Z7982 Long term (current) use of aspirin: Secondary | ICD-10-CM | POA: Insufficient documentation

## 2014-12-07 DIAGNOSIS — R202 Paresthesia of skin: Secondary | ICD-10-CM

## 2014-12-07 DIAGNOSIS — Z8639 Personal history of other endocrine, nutritional and metabolic disease: Secondary | ICD-10-CM | POA: Diagnosis not present

## 2014-12-07 DIAGNOSIS — R531 Weakness: Secondary | ICD-10-CM | POA: Insufficient documentation

## 2014-12-07 DIAGNOSIS — I5032 Chronic diastolic (congestive) heart failure: Secondary | ICD-10-CM | POA: Insufficient documentation

## 2014-12-07 DIAGNOSIS — Z79899 Other long term (current) drug therapy: Secondary | ICD-10-CM | POA: Diagnosis not present

## 2014-12-07 DIAGNOSIS — M2141 Flat foot [pes planus] (acquired), right foot: Secondary | ICD-10-CM

## 2014-12-07 DIAGNOSIS — I10 Essential (primary) hypertension: Secondary | ICD-10-CM | POA: Diagnosis not present

## 2014-12-07 DIAGNOSIS — I480 Paroxysmal atrial fibrillation: Secondary | ICD-10-CM | POA: Insufficient documentation

## 2014-12-07 DIAGNOSIS — R29898 Other symptoms and signs involving the musculoskeletal system: Secondary | ICD-10-CM

## 2014-12-07 LAB — URINALYSIS, ROUTINE W REFLEX MICROSCOPIC
Bilirubin Urine: NEGATIVE
Glucose, UA: NEGATIVE mg/dL
Hgb urine dipstick: NEGATIVE
Ketones, ur: NEGATIVE mg/dL
Leukocytes, UA: NEGATIVE
Nitrite: NEGATIVE
PH: 7 (ref 5.0–8.0)
Protein, ur: NEGATIVE mg/dL
SPECIFIC GRAVITY, URINE: 1.007 (ref 1.005–1.030)
Urobilinogen, UA: 0.2 mg/dL (ref 0.0–1.0)

## 2014-12-07 LAB — CBC WITH DIFFERENTIAL/PLATELET
BASOS PCT: 0 % (ref 0–1)
Basophils Absolute: 0 10*3/uL (ref 0.0–0.1)
EOS ABS: 0 10*3/uL (ref 0.0–0.7)
Eosinophils Relative: 0 % (ref 0–5)
HCT: 44.5 % (ref 36.0–46.0)
Hemoglobin: 15.2 g/dL — ABNORMAL HIGH (ref 12.0–15.0)
Lymphocytes Relative: 6 % — ABNORMAL LOW (ref 12–46)
Lymphs Abs: 0.6 10*3/uL — ABNORMAL LOW (ref 0.7–4.0)
MCH: 33.6 pg (ref 26.0–34.0)
MCHC: 34.2 g/dL (ref 30.0–36.0)
MCV: 98.2 fL (ref 78.0–100.0)
Monocytes Absolute: 0.6 10*3/uL (ref 0.1–1.0)
Monocytes Relative: 5 % (ref 3–12)
Neutro Abs: 9.8 10*3/uL — ABNORMAL HIGH (ref 1.7–7.7)
Neutrophils Relative %: 89 % — ABNORMAL HIGH (ref 43–77)
Platelets: 239 10*3/uL (ref 150–400)
RBC: 4.53 MIL/uL (ref 3.87–5.11)
RDW: 13.3 % (ref 11.5–15.5)
WBC: 11.1 10*3/uL — ABNORMAL HIGH (ref 4.0–10.5)

## 2014-12-07 LAB — COMPREHENSIVE METABOLIC PANEL
ALBUMIN: 4 g/dL (ref 3.5–5.0)
ALT: 17 U/L (ref 14–54)
AST: 31 U/L (ref 15–41)
Alkaline Phosphatase: 107 U/L (ref 38–126)
Anion gap: 10 (ref 5–15)
BILIRUBIN TOTAL: 0.6 mg/dL (ref 0.3–1.2)
BUN: 10 mg/dL (ref 6–20)
CO2: 26 mmol/L (ref 22–32)
CREATININE: 0.61 mg/dL (ref 0.44–1.00)
Calcium: 9.7 mg/dL (ref 8.9–10.3)
Chloride: 100 mmol/L — ABNORMAL LOW (ref 101–111)
GFR calc Af Amer: >60 mL/min (ref >60)
GFR calc non Af Amer: >60 mL/min (ref >60)
Glucose, Bld: 122 mg/dL — ABNORMAL HIGH (ref 65–99)
POTASSIUM: 4.2 mmol/L (ref 3.5–5.1)
SODIUM: 136 mmol/L (ref 135–145)
TOTAL PROTEIN: 7 g/dL (ref 6.5–8.1)

## 2014-12-07 LAB — RAPID URINE DRUG SCREEN, HOSP PERFORMED
AMPHETAMINES: NOT DETECTED
BARBITURATES: NOT DETECTED
BENZODIAZEPINES: NOT DETECTED
Cocaine: NOT DETECTED
OPIATES: NOT DETECTED
Tetrahydrocannabinol: NOT DETECTED

## 2014-12-07 LAB — APTT: aPTT: 30 seconds (ref 24–37)

## 2014-12-07 LAB — TROPONIN I: Troponin I: <0.03 ng/mL (ref <0.031)

## 2014-12-07 LAB — ETHANOL: Alcohol, Ethyl (B): <5 mg/dL (ref <5)

## 2014-12-07 MED ORDER — SODIUM CHLORIDE 0.9 % IV BOLUS (SEPSIS)
500.0000 mL | Freq: Once | INTRAVENOUS | Status: AC
  Administered 2014-12-07: 500 mL via INTRAVENOUS

## 2014-12-07 MED ORDER — SODIUM CHLORIDE 0.9 % IV SOLN: 100.0000 mL/h | INTRAVENOUS | Status: DC

## 2014-12-07 NOTE — ED Provider Notes (Signed)
CSN: [id number]     Arrival date & time 12/07/14  1303 History   First MD Initiated Contact with Patient 12/07/14 1304     Chief Complaint  Patient presents with  . Extremity Weakness     (Consider location/radiation/quality/duration/timing/severity/associated sxs/prior Treatment) HPI Patient presents with concern of new dysesthesia in the left foot. Symptoms began 6 hours ago, without clear precipitant. Since onset symptoms have been waxing, waning in severity. Symptoms are essentially located only in the foot, though there has been intermittent ascension up the left leg to the knee. No other new weakness, speech difficulty, facial asymmetry, confusion, disorientation, pain. Patient acknowledged a history of intermittent swelling in the foot, though none today. She is currently taking steroids for polymyalgia rheumatica.  Past Medical History  Diagnosis Date  . Hypertension   . Arthritis   . PAF (paroxysmal atrial fibrillation)   . Back pain   . Colon cancer   . Polymyalgia rheumatica   . Hypoglycemia   . HTN (hypertension) 08/08/2013  . Stroke   . Chronic diastolic CHF (congestive heart failure)   . Pulmonary hypertension     a. Echo 07/2014 - mildly increased PASP.   Past Surgical History  Procedure Laterality Date  . Colon surgery    . Cesarean section    . Bilateral oophorectomy    . Eye surgery     Family History  Problem Relation Age of Onset  . Aneurysm Mother   . Heart attack Father    History  Substance Use Topics  . Smoking status: Never Smoker   . Smokeless tobacco: Not on file  . Alcohol Use: No   OB History    No data available     Review of Systems  Constitutional:       Per HPI, otherwise negative  HENT:       Per HPI, otherwise negative  Respiratory:       Per HPI, otherwise negative  Cardiovascular:       Per HPI, otherwise negative  Gastrointestinal: Negative for vomiting.  Endocrine:       Negative aside from HPI  Genitourinary:     Neg aside from HPI   Musculoskeletal:       Per HPI, otherwise negative  Skin: Negative.   Neurological: Positive for numbness. Negative for syncope.      Allergies  Statins  Home Medications   Prior to Admission medications   Medication Sig Start Date End Date Taking? Authorizing Provider  acetaminophen (TYLENOL) 500 MG tablet Take 1 tablet (500 mg total) by mouth every 6 (six) hours as needed. Patient taking differently: Take 500 mg by mouth every 6 (six) hours as needed for moderate pain.  06/15/14   Erlene Quan, PA-C  aspirin 81 MG chewable tablet Chew 1 tablet (81 mg total) by mouth daily. 08/19/13   Lavon Paganini Angiulli, PA-C  calcium-vitamin D (OSCAL WITH D) 500-200 MG-UNIT per tablet Take 1 tablet by mouth daily with breakfast.    Historical Provider, MD  diclofenac (FLECTOR) 1.3 % PTCH Place 0.5 patches onto the skin 2 (two) times daily as needed (for pain).     Historical Provider, MD  diltiazem (CARDIZEM CD) 120 MG 24 hr capsule Take 1 capsule (120 mg total) by mouth daily. 08/15/14   Dorothy Spark, MD  docusate sodium (COLACE) 100 MG capsule Take 200 mg by mouth daily.     Historical Provider, MD  metoprolol succinate (TOPROL-XL) 50 MG 24 hr tablet Take  1 tablet (50 mg total) by mouth daily. Take with or immediately following a meal. 08/15/14   Dorothy Spark, MD  omeprazole (PRILOSEC OTC) 20 MG tablet Take 20 mg by mouth daily.     Historical Provider, MD  predniSONE (DELTASONE) 5 MG tablet Take 5 mg by mouth every morning.     Historical Provider, MD  sennosides-docusate sodium (SENOKOT-S) 8.6-50 MG tablet Take 1 tablet by mouth daily as needed for constipation.     Historical Provider, MD   There were no vitals taken for this visit. Physical Exam  Constitutional: She is oriented to person, place, and time. She appears well-developed and well-nourished. No distress.  HENT:  Head: Normocephalic and atraumatic.  Eyes: Conjunctivae and EOM are normal.  Cardiovascular:  Normal rate and regular rhythm.   Pulmonary/Chest: Effort normal and breath sounds normal. No stridor. No respiratory distress.  Abdominal: She exhibits no distension.  Musculoskeletal: She exhibits no edema.  Neurological: She is alert and oriented to person, place, and time. She displays no atrophy and no tremor. No cranial nerve deficit or sensory deficit. She exhibits normal muscle tone. She displays no seizure activity. Coordination normal.  Patient states that she has more sensation for light and sharp touch in the left foot than the right, though she can appreciate both.  Skin: Skin is warm and dry.  Psychiatric: She has a normal mood and affect.  Nursing note and vitals reviewed.   ED Course  Procedures (including critical care time) Labs Review Labs Reviewed  APTT  COMPREHENSIVE METABOLIC PANEL  CBC WITH DIFFERENTIAL/PLATELET  ETHANOL  TROPONIN I  URINE RAPID DRUG SCREEN (HOSP PERFORMED) NOT AT [facility]  URINALYSIS, ROUTINE W REFLEX MICROSCOPIC (NOT AT [facility])    Imaging Review Ct Head Wo Contrast  12/07/2014   CLINICAL DATA:  New onset left leg heavy feeling.  Hypertension.  EXAM: CT HEAD WITHOUT CONTRAST  TECHNIQUE: Contiguous axial images were obtained from the base of the skull through the vertex without intravenous contrast.  COMPARISON:  May 08, 2014  FINDINGS: There is mild diffuse atrophy. There is no intracranial mass, hemorrhage, extra-axial fluid collection, or midline shift. 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 There is patchy small vessel disease in the centra semiovale bilaterally. No acute infarct is apparent. The bony calvarium appears intact. There is a small right frontal exostosis, stable and benign in appearance. The mastoid air cells are clear.  IMPRESSION: Mild atrophy with patchy periventricular small vessel disease. No acute appearing infarct. There is a thin membrane in the  extra-axial soft tissues of the left frontal region. 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 There is no evidence of acute subdural hematoma. No acute hemorrhage apparent. No mass.   Electronically Signed   By: Lowella Grip III M.D.   On: 12/07/2014 14:00     EKG Interpretation   Date/Time:  Thursday December 07 2014 13:15:03 EDT Ventricular Rate:  74 PR Interval:  188 QRS Duration: 111 QT Interval:  412 QTC Calculation: 457 R Axis:   72 Text Interpretation:  Sinus rhythm Ventricular premature complex Probable  anteroseptal infarct, recent Lateral leads are also involved Sinus rhythm  Premature ventricular complexes Artifact Non-specific intra-ventricular  conduction delay Abnormal ekg Confirmed by Carmin Muskrat  MD [phone]) on  12/07/2014 1:24:43 PM      Chart review demonstrates recent admission for atrial fibrillation with rapid ventricular  response. Patient is not taking blood thinning medication beyond aspirin secondary to age, comorbidities.   I reviewed the results (including imaging as performed), agree with the interpretation  On repeat exam the patient appears better.  We reviewed all findings. We discussed possibility of occult stroke, the  risks and benefits of adding blood thinning medication to the patient's current use of aspirin.  Patient and her daughter both agree that aspirin is a desirable amount of prophylaxis, noting concern for patient's easy bruising, fall risk.   MDM   Final diagnoses:  Weakness     patient presents with new dysesthesia in the left foot. Here patient is otherwise neurologically intact, and only has subjective sensory loss in the foot. Impression evaluation for the reassuring, with no notable findings. Patient is medically managed for stroke prophylaxis with aspirin, and given her age, fall risk profile, this seems appropriate. Patient was discharged to follow-up with primary care.    Carmin Muskrat,  MD 12/07/14 [phone]

## 2014-12-07 NOTE — Progress Notes (Signed)
Krystal Hensley  MRN: [id number] DOB: 08/09/1917  Subjective:  Pt presents to clinic with her left foot feeling heavy since about 8am this am.  She woke up and felt fine and then when she started to fix her breakfast her left foot started to feel heavy and slightly weak.  Over the next hour she felt like the sensation went up towards her knee but never past it.  She is having no associated pain in her left foot.  She is having some "twinge" like pain in her left upper arm but not the same sensation as she is having in her left foot/leg.  She is having no confusion, trouble speaking or finding words.  She is able to tell her history without problems and she answers questions without problems.  Her daughter is with her and she has nothing to add to the history.  Patient Active Problem List   Diagnosis Date Noted  . Dyspnea 07/24/2014  . Abdominal pain, right upper quadrant 07/24/2014  . Chronic diastolic CHF (congestive heart failure) 07/24/2014  . Atrial fibrillation with rapid ventricular response 06/14/2014  . Constipation 04/03/2014  . Pulmonary hypertension 02/02/2014  . Chest pain 01/14/2014  . CVA (cerebral infarction)-by MRI Feb 2015 08/08/2013  . RUE weakness 08/08/2013  . HTN (hypertension) 08/08/2013  . PMR (polymyalgia rheumatica) 08/08/2013  . Spastic hemiplegia affecting dominant side 08/08/2013  . Wound of left leg 08/08/2013  . Hx of PAF- CHADS VASC=7 08/08/2013    Past Medical History  Diagnosis Date  . Hypertension   . Arthritis   . PAF (paroxysmal atrial fibrillation)   . Back pain   . Colon cancer   . Polymyalgia rheumatica   . Hypoglycemia   . HTN (hypertension) 08/08/2013  . Stroke   . Chronic diastolic CHF (congestive heart failure)   . Pulmonary hypertension     a. Echo 07/2014 - mildly increased PASP.     Current outpatient prescriptions:  .  acetaminophen (TYLENOL) 500 MG tablet, Take 1 tablet (500 mg total) by mouth every 6 (six) hours as needed.  (Patient taking differently: Take 500 mg by mouth every 6 (six) hours as needed for moderate pain. ), Disp: 30 tablet, Rfl: 0 .  aspirin 81 MG chewable tablet, Chew 1 tablet (81 mg total) by mouth daily., Disp: , Rfl:  .  calcium-vitamin D (OSCAL WITH D) 500-200 MG-UNIT per tablet, Take 1 tablet by mouth daily with breakfast., Disp: , Rfl:  .  diltiazem (CARDIZEM CD) 120 MG 24 hr capsule, Take 1 capsule (120 mg total) by mouth daily., Disp: 90 capsule, Rfl: 3 .  docusate sodium (COLACE) 100 MG capsule, Take 200 mg by mouth daily. , Disp: , Rfl:  .  metoprolol succinate (TOPROL-XL) 50 MG 24 hr tablet, Take 1 tablet (50 mg total) by mouth daily. Take with or immediately following a meal., Disp: 90 tablet, Rfl: 3 .  omeprazole (PRILOSEC OTC) 20 MG tablet, Take 20 mg by mouth daily. , Disp: , Rfl:  .  predniSONE (DELTASONE) 5 MG tablet, Take 5 mg by mouth every morning. , Disp: , Rfl:  .  sennosides-docusate sodium (SENOKOT-S) 8.6-50 MG tablet, Take 1 tablet by mouth daily as needed for constipation. , Disp: , Rfl:  .  diclofenac (FLECTOR) 1.3 % PTCH, Place 0.5 patches onto the skin 2 (two) times daily as needed (for pain). , Disp: , Rfl:   Allergies  Allergen Reactions  . Statins Other (See Comments)  myalgia    Review of Systems  Constitutional: Negative for fever and chills.  Cardiovascular: Negative for chest pain and palpitations.  Neurological: Positive for focal weakness (left leg foot feel heavy). Negative for dizziness, tingling and sensory change.    Objective:  Physical Exam  Constitutional: She is oriented to person, place, and time and well-developed, well-nourished, and in no distress.  BP 158/96 mmHg  Pulse 92  Temp(Src) 97.8 F (36.6 C) (Oral)  Resp 18  Ht 5\' 1"  (1.549 m)  Wt 115 lb 12.8 oz (52.527 kg)  BMI 21.89 kg/m2  SpO2 98%   HENT:  Head: Normocephalic and atraumatic.  Right Ear: Hearing and external ear normal.  Left Ear: Hearing and external ear normal.    Eyes: Conjunctivae are normal.  Neck: Normal range of motion.  Cardiovascular: Normal rate, regular rhythm and normal heart sounds.   No murmur heard. Pulmonary/Chest: Effort normal and breath sounds normal.  Neurological: She is alert and oriented to person, place, and time. She displays weakness (left leg below knee strength 4/5,  plantar flexion 2/5, dorsiflexion 3/5 ). She displays facial symmetry and normal speech. A sensory deficit (slight decrease in monofilament testing with left foot and medial leg) is present. She exhibits normal muscle tone. She shows no pronator drift. Gait abnormal. Coordination normal.  Walks with cane, limp on the left side.    Skin: Skin is warm and dry.  Psychiatric: Mood, memory, affect and judgment normal.   EKG - NSR with nonspecific ST elevation in V2V3 which is unchanged from 2/16. Assessment and Plan :  Weakness of right foot  Atrial fibrillation, unspecified - Plan: EKG 12-Lead   Concerning for stroke with her co-morbidities and the fact that her symptoms are not improving and there is deficit on exam she was sent by EMS to [facility] for evaluation of possible stroke.  D/w Dr Myriam Forehand PA-C  [facility] 12/07/2014 12:20 PM

## 2014-12-07 NOTE — ED Notes (Signed)
Pt reports noting left leg heaviness this AM when walking in the kitchen. Stroke scale neg, no facial droop, no weakness noted.

## 2014-12-07 NOTE — Discharge Instructions (Signed)
As discussed, your evaluation today has been largely reassuring.  But, it is important that you monitor your condition carefully, and do not hesitate to return to the ED if you develop new, or concerning changes in your condition. ? ?Otherwise, please follow-up with your physician for appropriate ongoing care. ? ?

## 2014-12-20 ENCOUNTER — Encounter: Payer: Self-pay | Admitting: Cardiology

## 2015-01-16 ENCOUNTER — Other Ambulatory Visit: Payer: Self-pay | Admitting: Physician Assistant

## 2015-02-05 ENCOUNTER — Ambulatory Visit: Payer: Medicare Other | Admitting: Cardiology

## 2015-02-15 ENCOUNTER — Ambulatory Visit (INDEPENDENT_AMBULATORY_CARE_PROVIDER_SITE_OTHER): Payer: Medicare Other | Admitting: Cardiology

## 2015-02-15 ENCOUNTER — Encounter: Payer: Self-pay | Admitting: Cardiology

## 2015-02-15 VITALS — BP 138/70 | HR 75 | Ht 61.0 in | Wt 115.0 lb

## 2015-02-15 DIAGNOSIS — I4891 Unspecified atrial fibrillation: Secondary | ICD-10-CM

## 2015-02-15 DIAGNOSIS — I272 Pulmonary hypertension, unspecified: Secondary | ICD-10-CM

## 2015-02-15 DIAGNOSIS — I5033 Acute on chronic diastolic (congestive) heart failure: Secondary | ICD-10-CM | POA: Diagnosis not present

## 2015-02-15 DIAGNOSIS — I10 Essential (primary) hypertension: Secondary | ICD-10-CM | POA: Diagnosis not present

## 2015-02-15 DIAGNOSIS — I270 Primary pulmonary hypertension: Secondary | ICD-10-CM | POA: Diagnosis not present

## 2015-02-15 HISTORY — DX: Acute on chronic diastolic (congestive) heart failure: I50.33

## 2015-02-15 MED ORDER — HYDROCHLOROTHIAZIDE 25 MG PO TABS: 25.0000 mg | ORAL_TABLET | Freq: Every day | ORAL | Status: DC

## 2015-02-15 NOTE — Patient Instructions (Signed)
Medication Instructions:   START TAKING HYDROCHLOROTHIAZIDE 25 MG ONCE DAILY      Follow-Up:  3 MONTHS WITH DR Meda Coffee

## 2015-02-15 NOTE — Progress Notes (Signed)
Patient ID: ANALESE Hensley, female   DOB: 05-04-1918, 81 y.o.   MRN: [id number]     Cardiology Office Note Date:  02/15/2015  Patient ID:  Krystal Hensley, DOB 05/15/1918, MRN [id number] PCP:  [name], MD  Cardiologist:  Meda Coffee   Chief Complaint: PND, LE edema  History of Present Illness: Krystal Hensley is a 81 y.o. female with history of CVA, HTN, PAF (not on anticoagulation due to fall risk/advanced age/patient choice), PMR, arthritis, pulm HTN, chronic diastolic CHF who presents for post-hospital follow-up. She was admitted to [facility] 1/18-1/20 with SOB, abdominal pain and RQU pain. She was found to be in AF with RVR, converted in the ED, then reverted back into atrial fibrillation/flutter with RVR. Metoprolol was increased and she returned to normal rhythm. Abd [place] normal. Troponins were normal. Labwork otherwise grossly unremarkable including normal TSH, normal Hgb, BMET OK. She also received low dose IV Lasix for acute on chronic diastolic CHF with improvement in breathing. 2D echo 07/25/14: mild LVH, mild focal basal hypertrophy of septum, EF 65-70%, no RWMA, mildly increased PASP, moderate LAE, mild MR, AI and TR. There was mention per hospital notes to revisit the topic of anticoagulation as an outpatient.  She has been doing well since hospital discharge. She is remarkably sharp mentally. She has been working with PT to increase strength at home. No recent falls, but she remains leery of starting a blood thinner. She reports that she's an easy bleeder if she injures or scratches herself. She denies orthopnea, LEE, PND, SOB, or chest pain. She is in NSR today. She requests refills on her metoprolol and diltiazem.  Her salient complaint is that of constipation which she wonders is related to her heart meds. Prune juice doesn't seem to help. She tried Miralax but it made her go too much. She is taking Senna nightly but now has inconsistency in constipation alternating with urgency  to go. No bleeding.  02/15/2015 - the patient is coming after 4 months, she feels well and denies any palpitations or falls. She walks with a cane. She denied anticoagulation at the last visit.  She has been waking up at night with some SOB that resolves after she takes a couple of deep reaths and walks to the bathroom. She has noticed mild LE edem at the end of the days.    Past Medical History  Diagnosis Date  . Hypertension   . Arthritis   . PAF (paroxysmal atrial fibrillation)   . Back pain   . Colon cancer   . Polymyalgia rheumatica   . Hypoglycemia   . HTN (hypertension) 08/08/2013  . Stroke   . Chronic diastolic CHF (congestive heart failure)   . Pulmonary hypertension     a. Echo 07/2014 - mildly increased PASP.    Past Surgical History  Procedure Laterality Date  . Colon surgery    . Cesarean section    . Bilateral oophorectomy    . Eye surgery      Current Outpatient Prescriptions  Medication Sig Dispense Refill  . acetaminophen (TYLENOL) 500 MG tablet Take 1 tablet (500 mg total) by mouth every 6 (six) hours as needed. (Patient taking differently: Take 500 mg by mouth every 6 (six) hours as needed for moderate pain. ) 30 tablet 0  . aspirin 81 MG chewable tablet Chew 1 tablet (81 mg total) by mouth daily.    . calcium-vitamin D (OSCAL WITH D) 500-200 MG-UNIT per tablet Take 1 tablet by mouth  daily with breakfast.    . diclofenac (FLECTOR) 1.3 % PTCH Place 0.5 patches onto the skin 2 (two) times daily as needed (for pain).     Marland Kitchen diltiazem (CARDIZEM CD) 120 MG 24 hr capsule Take 1 capsule (120 mg total) by mouth daily. 30 capsule 0  . docusate sodium (COLACE) 100 MG capsule Take 200 mg by mouth daily as needed.     . Ibuprofen ([employer] IBUPROFEN) 200 MG CAPS Take 1 capsule (200 mg total) by mouth 2 (two) times daily. (Patient taking differently: Take 200 mg by mouth 2 (two) times daily as needed (for pain). ) 120 each 0  . metoprolol succinate (TOPROL-XL) 50 MG 24 hr tablet  Take 1 tablet (50 mg total) by mouth daily. Take with or immediately following a meal. 30 tablet 0  . omeprazole (PRILOSEC OTC) 20 MG tablet Take 20 mg by mouth daily.     . predniSONE (DELTASONE) 5 MG tablet Take 5 mg by mouth every morning.     . sennosides-docusate sodium (SENOKOT-S) 8.6-50 MG tablet Take 1 tablet by mouth at bedtime.     No current facility-administered medications for this visit.    Allergies:   Statins   Social History:  The patient  reports that she has never smoked. She does not have any smokeless tobacco history on file. She reports that she does not drink alcohol or use illicit drugs.   Family History:  The patient's family history includes Aneurysm in her mother; Heart attack in her father.  ROS:  Please see the history of present illness.  All other systems are reviewed and otherwise negative.   PHYSICAL EXAM: VS:  There were no vitals taken for this visit. BMI: There is no weight on file to calculate BMI. Well developed, thin WF in no acute distress - I saw her ambulating into the clinic without difficulty. HEENT: normocephalic, atraumatic Neck: no JVD Cardiac:  normal S1, S2; RRR; no murmur Lungs:  clear to auscultation bilaterally, no wheezing, rhonchi or rales Abd: soft, nontender, no hepatomegaly Ext: mild perimalleolar edema B/L Skin: warm and dry Neuro:  moves all extremities spontaneously, no focal abnormalities noted  EKG: NSR 77bpm, septal infarct age undetermined, nonspecific T wave changes in I, avL, V6. No acute change from prior.  Recent Labs: 07/24/2014: B Natriuretic Peptide 581.3*; TSH 1.076 12/07/2014: ALT 17; BUN 10; Creatinine, Ser 0.61; Hemoglobin 15.2*; Platelets 239; Potassium 4.2; Sodium 136  No results found for requested labs within last 365 days.   CrCl cannot be calculated (Unknown ideal weight.).   Wt Readings from Last 3 Encounters:  12/07/14 115 lb 12.8 oz (52.527 kg)  10/09/14 114 lb (51.71 kg)  08/11/14 112 lb (50.803  kg)    Other studies reviewed: Additional studies/records reviewed today include: hospital records above.   ASSESSMENT AND PLAN:  1. Paroxysmal atrial fibrillation - maintaining NSR. Continue metoprolol and diltiazem. Refuses anticoagulation,  CHADSVASC score is 7 thus she is at high risk for stroke. She thought about it and is concerned about bleeding and risk of falls at her age. I think that it is reasonable to continue ASA only especially since the patient is very reluctant to start anticoagulation.   2. Acute on chronic diastolic CHF - she appears mildly overloaded, we will start hydrochlorothiazide 25 mg po daily.  She is advised to check her weight and report if increases by 3 lbs in 1 week.  3. Essential HTN - controlled. 4. Pulm HTN - PASP  mildly elevated by recent echo. Follow clinically. 5. PMR - on chronic prednisone for this.  Follow up in 3 months.   Current medicines are reviewed at length with the patient today.  The patient did not have any concerns regarding medicines.  Signed, Dorothy Spark, MD  02/15/2015 1:05 PM     [place] [address] [phone] (office)  [phone] (fax)

## 2015-02-26 ENCOUNTER — Ambulatory Visit: Payer: Medicare Other | Admitting: Podiatry

## 2015-02-28 ENCOUNTER — Telehealth: Payer: Self-pay | Admitting: Cardiology

## 2015-02-28 NOTE — Telephone Encounter (Signed)
New message    Pt c/o medication issue:  1. Name of Medication: hydrochlorothiazide  2. How are you currently taking this medication (dosage and times per day)? 25mg  1 time a day  3. Are you having a reaction (difficulty breathing--STAT)? No  4. What is your medication issue? Pt is having nausea and stomach issues  Please call to discuss

## 2015-02-28 NOTE — Telephone Encounter (Signed)
I would continue taking it, please follow in few days to see how she feels.

## 2015-02-28 NOTE — Telephone Encounter (Signed)
Attempted to call the pt back x 2 and phone line was busy.  Will continue to follow-up with the pt.

## 2015-02-28 NOTE — Telephone Encounter (Signed)
Informed the pt that per Dr Meda Coffee she recommends the pt to continue taking her prescribed regimen of HCTZ and call us in a few days to report how she is feeling.  Advised the pt that if her stomach is still upset, she should maintain on the BRAT diet and stay well hydrated with Gingerale and gatorade (in moderation). Pt verbalized understanding and agrees with this plan.

## 2015-02-28 NOTE — Telephone Encounter (Signed)
Pt calling to inform Dr Meda Coffee that she is not quite sure if she had a stomach bug last week or if taking her HCTZ  caused her to have some nausea and very loose stools.  Pt states she did not have diarrhea at all and did not vomit.  Pt states she read the side effects of taking HCTZ and listed was nausea and loose stools.  Pt states that her stomach is feeling better now and she is still taking the HCTZ.  Pt states she's having formed stools now, with the last one being Sunday night.  Pt states that the HCTZ did help her with her SOB and weight gain.  Her current weight is now at 109 lbs, and at last OV on 8/11, it was 115lbs.  Pt states that when she had the stomach issues, she could not control her bowel movement.  Pt wanted to ask Dr Meda Coffee if the HCTZ caused her stomach issues, or does she think it was just viral.  Pt currently has no complaints at the present time.   Pt wanted to ask Dr Meda Coffee if she could 1/2 her HCTZ dose, if she thinks this caused her stomach issues. Pt states she is eating appropriately, for she receives meals on wheels.  Informed the pt that I will route this message to Dr Meda Coffee for further review and recommendation and follow-up with her thereafter.  Pt verbalized understanding and agrees with this plan.

## 2015-03-14 ENCOUNTER — Encounter: Payer: Self-pay | Admitting: Podiatry

## 2015-03-14 ENCOUNTER — Ambulatory Visit (INDEPENDENT_AMBULATORY_CARE_PROVIDER_SITE_OTHER): Payer: Medicare Other | Admitting: Podiatry

## 2015-03-14 DIAGNOSIS — M79676 Pain in unspecified toe(s): Secondary | ICD-10-CM | POA: Diagnosis not present

## 2015-03-14 DIAGNOSIS — B351 Tinea unguium: Secondary | ICD-10-CM

## 2015-03-15 NOTE — Progress Notes (Signed)
Patient ID: Krystal Hensley, female   DOB: 1918-02-08, 81 y.o.   MRN: [id number]  Subjective: This patient presents today for scheduled visit complaining of painful toenails and requests toenail debridement  Objective: Overlapping left hallux The toenails are incurvated, brittle, hypertrophic and tender to direct palpation  Assessment: Symptomatic onychomycoses 6-10  Plan: Debrided toenails 10 without any bleeding  Reappoint 3 month

## 2015-03-19 ENCOUNTER — Other Ambulatory Visit: Payer: Self-pay | Admitting: Physician Assistant

## 2015-05-28 ENCOUNTER — Encounter: Payer: Self-pay | Admitting: Cardiology

## 2015-05-28 ENCOUNTER — Ambulatory Visit (INDEPENDENT_AMBULATORY_CARE_PROVIDER_SITE_OTHER): Payer: Medicare Other | Admitting: Cardiology

## 2015-05-28 VITALS — BP 120/66 | HR 79 | Ht 62.0 in | Wt 114.4 lb

## 2015-05-28 DIAGNOSIS — I5033 Acute on chronic diastolic (congestive) heart failure: Secondary | ICD-10-CM

## 2015-05-28 DIAGNOSIS — I272 Other secondary pulmonary hypertension: Secondary | ICD-10-CM

## 2015-05-28 DIAGNOSIS — Z23 Encounter for immunization: Secondary | ICD-10-CM | POA: Diagnosis not present

## 2015-05-28 DIAGNOSIS — I10 Essential (primary) hypertension: Secondary | ICD-10-CM | POA: Diagnosis not present

## 2015-05-28 MED ORDER — HYDROCHLOROTHIAZIDE 25 MG PO TABS: 25.0000 mg | ORAL_TABLET | ORAL | Status: DC | PRN

## 2015-05-28 NOTE — Patient Instructions (Signed)
Medication Instructions:  Change HCTZ to 1 tablet as needed  Labwork: None  Testing/Procedures: None  Follow-Up: Your physician wants you to follow-up in: 6 months. You will receive a reminder letter in the mail two months in advance. If you don't receive a letter, please call our office to schedule the follow-up appointment.      If you need a refill on your cardiac medications before your next appointment, please call your pharmacy.

## 2015-05-28 NOTE — Progress Notes (Signed)
Patient ID: Krystal Hensley, female   DOB: 14-Feb-1918, 81 y.o.   MRN: [id number]     Cardiology Office Note Date:  05/28/2015  Patient ID:  Krystal Hensley, DOB 04-30-1918, MRN [id number] PCP:  [name], MD  Cardiologist:  Meda Coffee   Chief Complaint: PND, LE edema  History of Present Illness: Krystal Hensley is a 81 y.o. female with history of CVA, HTN, PAF (not on anticoagulation due to fall risk/advanced age/patient choice), PMR, arthritis, pulm HTN, chronic diastolic CHF who presents for post-hospital follow-up. She was admitted to [facility] 1/18-1/20 with SOB, abdominal pain and RQU pain. She was found to be in AF with RVR, converted in the ED, then reverted back into atrial fibrillation/flutter with RVR. Metoprolol was increased and she returned to normal rhythm. Abd [place] normal. Troponins were normal. Labwork otherwise grossly unremarkable including normal TSH, normal Hgb, BMET OK. She also received low dose IV Lasix for acute on chronic diastolic CHF with improvement in breathing. 2D echo 07/25/14: mild LVH, mild focal basal hypertrophy of septum, EF 65-70%, no RWMA, mildly increased PASP, moderate LAE, mild MR, AI and TR. There was mention per hospital notes to revisit the topic of anticoagulation as an outpatient.  She has been doing well since hospital discharge. She is remarkably sharp mentally. She has been working with PT to increase strength at home. No recent falls, but she remains leery of starting a blood thinner. She reports that she's an easy bleeder if she injures or scratches herself. She denies orthopnea, LEE, PND, SOB, or chest pain. She is in NSR today. She requests refills on her metoprolol and diltiazem.  Her salient complaint is that of constipation which she wonders is related to her heart meds. Prune juice doesn't seem to help. She tried Miralax but it made her go too much. She is taking Senna nightly but now has inconsistency in constipation alternating with urgency  to go. No bleeding.  05/28/2015 - the patient is coming after 3 months, she feels well and denies any palpitations or falls. She walks with a cane. She denied anticoagulation at the last visit. She is still very active, walking with a cane, no chest pain, stable dyspnea on moderate exertion. Occasional abdominal and left neck pain. No LE edema.    Past Medical History  Diagnosis Date  . Hypertension   . Arthritis   . PAF (paroxysmal atrial fibrillation) ([place])   . Back pain   . Colon cancer ([place])   . Polymyalgia rheumatica ([place])   . Hypoglycemia   . HTN (hypertension) 08/08/2013  . Stroke ([place])   . Chronic diastolic CHF (congestive heart failure) ([place])   . Pulmonary hypertension ([place])     a. Echo 07/2014 - mildly increased PASP.    Past Surgical History  Procedure Laterality Date  . Colon surgery    . Cesarean section    . Bilateral oophorectomy    . Eye surgery      Current Outpatient Prescriptions  Medication Sig Dispense Refill  . acetaminophen (TYLENOL) 500 MG tablet Take 1 tablet (500 mg total) by mouth every 6 (six) hours as needed. (Patient taking differently: Take 500 mg by mouth every 6 (six) hours as needed for moderate pain. ) 30 tablet 0  . aspirin 81 MG chewable tablet Chew 1 tablet (81 mg total) by mouth daily.    . calcium-vitamin D (OSCAL WITH D) 500-200 MG-UNIT per tablet Take 1 tablet by mouth daily with breakfast.    .  diclofenac (FLECTOR) 1.3 % PTCH Place 0.5 patches onto the skin 2 (two) times daily as needed (for pain).     Marland Kitchen diltiazem (CARDIZEM CD) 120 MG 24 hr capsule Take 1 capsule (120 mg total) by mouth daily. 30 capsule 0  . docusate sodium (COLACE) 100 MG capsule Take 200 mg by mouth daily as needed.     . Ibuprofen ([employer] IBUPROFEN) 200 MG CAPS Take 1 capsule (200 mg total) by mouth 2 (two) times daily. (Patient taking differently: Take 200 mg by mouth 2 (two) times daily as needed (for pain). ) 120 each 0  . metoprolol succinate (TOPROL-XL) 50 MG 24 hr  tablet Take 1 tablet (50 mg total) by mouth daily. Take with or immediately following a meal. 30 tablet 0  . omeprazole (PRILOSEC OTC) 20 MG tablet Take 20 mg by mouth daily.     . predniSONE (DELTASONE) 5 MG tablet Take 5 mg by mouth every morning.     . sennosides-docusate sodium (SENOKOT-S) 8.6-50 MG tablet Take 1 tablet by mouth at bedtime.     No current facility-administered medications for this visit.    Allergies:   Statins   Social History:  The patient  reports that she has never smoked. She does not have any smokeless tobacco history on file. She reports that she does not drink alcohol or use illicit drugs.   Family History:  The patient's family history includes Aneurysm in her mother; Heart attack in her father.  ROS:  Please see the history of present illness.  All other systems are reviewed and otherwise negative.   PHYSICAL EXAM: VS:  BP 120/66 mmHg  Pulse 79  Ht 5\' 2"  (1.575 m)  Wt 114 lb 6.4 oz (51.891 kg)  BMI 20.92 kg/m2  SpO2 93% BMI: Body mass index is 20.92 kg/(m^2). Well developed, thin WF in no acute distress - I saw her ambulating into the clinic without difficulty. HEENT: normocephalic, atraumatic Neck: no JVD Cardiac:  normal S1, S2; RRR; no murmur Lungs:  clear to auscultation bilaterally, no wheezing, rhonchi or rales Abd: soft, nontender, no hepatomegaly Ext: mild perimalleolar edema B/L Skin: warm and dry Neuro:  moves all extremities spontaneously, no focal abnormalities noted  EKG: NSR 77bpm, septal infarct age undetermined, nonspecific T wave changes in I, avL, V6. No acute change from prior.  Recent Labs: 07/24/2014: B Natriuretic Peptide 581.3*; TSH 1.076 12/07/2014: ALT 17; BUN 10; Creatinine, Ser 0.61; Hemoglobin 15.2*; Platelets 239; Potassium 4.2; Sodium 136  No results found for requested labs within last 365 days.   CrCl cannot be calculated (Patient has no serum creatinine result on file.).   Wt Readings from Last 3 Encounters:    05/28/15 114 lb 6.4 oz (51.891 kg)  02/15/15 115 lb (52.164 kg)  12/07/14 115 lb 12.8 oz (52.527 kg)    Other studies reviewed: Additional studies/records reviewed today include: hospital records above.   ASSESSMENT AND PLAN:  1. Paroxysmal atrial fibrillation - maintaining NSR. Continue metoprolol and diltiazem. Refuses anticoagulation,  CHADSVASC score is 7 thus she is at high risk for stroke. She thought about it and is concerned about bleeding and risk of falls at her age. I think that it is reasonable to continue ASA only.  2. Acute on chronic diastolic CHF - she appears euvolemic, HCTZ only as needed.    3. Essential HTN - controlled.  4. Pulm HTN - PASP mildly elevated by recent echo. Follow clinically.  5. PMR - on chronic prednisone  for this.  Follow up in 6 months.    Corliss Blacker, MD  05/28/2015 1:54 PM     [place] [address] [place] [address] [phone] (office)  [phone] (fax)

## 2015-06-13 ENCOUNTER — Encounter: Payer: Self-pay | Admitting: Podiatry

## 2015-06-13 ENCOUNTER — Ambulatory Visit (INDEPENDENT_AMBULATORY_CARE_PROVIDER_SITE_OTHER): Payer: Medicare Other | Admitting: Podiatry

## 2015-06-13 DIAGNOSIS — M79676 Pain in unspecified toe(s): Secondary | ICD-10-CM | POA: Diagnosis not present

## 2015-06-13 DIAGNOSIS — B351 Tinea unguium: Secondary | ICD-10-CM | POA: Diagnosis not present

## 2015-06-14 NOTE — Progress Notes (Signed)
Patient ID: Krystal Hensley, female   DOB: 1918/02/23, 81 y.o.   MRN: [id number]   Subjective: This patient presents again today for scheduled visit complaining of thick, long toenails are uncomfortable when she walks and wear shoes and is requesting toenail debridement  Objective: Orientated 3 Overlapping left hallux The toenails are hypertrophic, discolored, elongated, incurvated and tender direct palpation 6-10  Assessment: Symptomatic onychomycoses 6-10  Plan: Debrided toenails 10 mechanically and electronically without any bleeding  Reappoint 3

## 2015-08-08 ENCOUNTER — Other Ambulatory Visit: Payer: Self-pay | Admitting: Cardiology

## 2015-09-17 ENCOUNTER — Other Ambulatory Visit: Payer: Self-pay

## 2015-09-17 MED ORDER — METOPROLOL SUCCINATE ER 50 MG PO TB24: 50.0000 mg | ORAL_TABLET | Freq: Every day | ORAL | Status: DC

## 2015-09-19 ENCOUNTER — Ambulatory Visit (INDEPENDENT_AMBULATORY_CARE_PROVIDER_SITE_OTHER): Payer: Medicare Other | Admitting: Podiatry

## 2015-09-19 ENCOUNTER — Encounter: Payer: Self-pay | Admitting: Podiatry

## 2015-09-19 DIAGNOSIS — B351 Tinea unguium: Secondary | ICD-10-CM

## 2015-09-19 DIAGNOSIS — M79676 Pain in unspecified toe(s): Secondary | ICD-10-CM

## 2015-09-20 NOTE — Progress Notes (Signed)
Patient ID: Krystal Hensley, female   DOB: 09-08-1917, 82 y.o.   MRN: [id number]  Subjective: Patient presents complaining of painful toenails when walking wearing shoes and request debridement of her toenails on the right and left feet  Objective: Orientated 3 Overlapping left hallux The toenails are elongated, brittle, incurvated, hypertrophic and tender to palpation HAV right  Assessment: Symptomatic onychomycoses 6-10  Plan: Debrided toenails 10 mechanically and electrically without any bleeding  Reappoint 3 months I

## 2015-11-22 ENCOUNTER — Ambulatory Visit (INDEPENDENT_AMBULATORY_CARE_PROVIDER_SITE_OTHER): Payer: Medicare Other | Admitting: Cardiology

## 2015-11-22 VITALS — BP 124/68 | HR 77 | Ht 62.0 in | Wt 115.0 lb

## 2015-11-22 DIAGNOSIS — I4891 Unspecified atrial fibrillation: Secondary | ICD-10-CM | POA: Diagnosis not present

## 2015-11-22 DIAGNOSIS — E871 Hypo-osmolality and hyponatremia: Secondary | ICD-10-CM | POA: Diagnosis not present

## 2015-11-22 DIAGNOSIS — I5032 Chronic diastolic (congestive) heart failure: Secondary | ICD-10-CM | POA: Diagnosis not present

## 2015-11-22 DIAGNOSIS — I10 Essential (primary) hypertension: Secondary | ICD-10-CM

## 2015-11-22 DIAGNOSIS — I480 Paroxysmal atrial fibrillation: Secondary | ICD-10-CM

## 2015-11-22 DIAGNOSIS — M353 Polymyalgia rheumatica: Secondary | ICD-10-CM

## 2015-11-22 MED ORDER — FUROSEMIDE 20 MG PO TABS: 20.0000 mg | ORAL_TABLET | Freq: Every day | ORAL | Status: DC | PRN

## 2015-11-22 NOTE — Patient Instructions (Signed)
Medication Instructions:   STOP TAKING HYDROCHLOROTHIAZIDE NOW  DR NELSON HAS PRESCRIBED FOR YOU TO TAKE LASIX 20 MG ONCE DAILY, ONLY AS NEEDED FOR LOWER EXTREMITY EDEMA     Follow-Up:  3 MONTHS WITH DR Meda Coffee      If you need a refill on your cardiac medications before your next appointment, please call your pharmacy.

## 2015-11-22 NOTE — Progress Notes (Signed)
Patient ID: Krystal Hensley, female   DOB: [birth year], 82 y.o.   MRN: [id number]     Cardiology Office Note Date:  11/22/2015  Patient ID:  Krystal Hensley, DOB 1917-09-01, MRN [id number] PCP:  [name], MD  Cardiologist:  Meda Coffee   Chief Complaint: PND, LE edema  History of Present Illness: Krystal Hensley is a 82 y.o. female with history of CVA, HTN, PAF (not on anticoagulation due to fall risk/advanced age/patient choice), PMR, arthritis, pulm HTN, chronic diastolic CHF who presents for post-hospital follow-up. She was admitted to [facility] 1/18-1/20 with SOB, abdominal pain and RQU pain. She was found to be in AF with RVR, converted in the ED, then reverted back into atrial fibrillation/flutter with RVR. Metoprolol was increased and she returned to normal rhythm. Abd [place] normal. Troponins were normal. Labwork otherwise grossly unremarkable including normal TSH, normal Hgb, BMET OK. She also received low dose IV Lasix for acute on chronic diastolic CHF with improvement in breathing. 2D echo 07/25/14: mild LVH, mild focal basal hypertrophy of septum, EF 65-70%, no RWMA, mildly increased PASP, moderate LAE, mild MR, AI and TR. There was mention per hospital notes to revisit the topic of anticoagulation as an outpatient.  She has been doing well since hospital discharge. She is remarkably sharp mentally. She has been working with PT to increase strength at home. No recent falls, but she remains leery of starting a blood thinner. She reports that she's an easy bleeder if she injures or scratches herself. She denies orthopnea, LEE, PND, SOB, or chest pain. She is in NSR today. She requests refills on her metoprolol and diltiazem.  Her salient complaint is that of constipation which she wonders is related to her heart meds. Prune juice doesn't seem to help. She tried Miralax but it made her go too much. She is taking Senna nightly but now has inconsistency in constipation alternating with urgency  to go. No bleeding.  05/28/2015 - the patient is coming after 3 months, she feels well and denies any palpitations or falls. She walks with a cane. She denied anticoagulation at the last visit. She is still very active, walking with a cane, no chest pain, stable dyspnea on moderate exertion. Occasional abdominal and left neck pain. No LE edema.   11/22/2015 - patient is coming after 6 months, she has been doing well, except for last 2 weeks when she noticed some fatigue and mild memory loss, she presented to primary care physician we'll check her labs and felt that she has hyponatremia. She has been using hydrochlorothiazide instead of when necessary every day. She develops on and off left lower extremity edema, but no orthopnea or paroxysmal nocturnal dyspnea, no chest pain shortness of breath no palpitations syncope or fall. She is complaining of loss of smell and taste or the last couple years to the point where she doesn't taste any food.  Past Medical History  Diagnosis Date  . Hypertension   . Arthritis   . PAF (paroxysmal atrial fibrillation) ([place])   . Back pain   . Colon cancer ([place])   . Polymyalgia rheumatica ([place])   . Hypoglycemia   . HTN (hypertension) 08/08/2013  . Stroke ([place])   . Chronic diastolic CHF (congestive heart failure) ([place])   . Pulmonary hypertension ([place])     a. Echo 07/2014 - mildly increased PASP.    Past Surgical History  Procedure Laterality Date  . Colon surgery    . Cesarean section    .  Bilateral oophorectomy    . Eye surgery      Current Outpatient Prescriptions  Medication Sig Dispense Refill  . acetaminophen (TYLENOL) 500 MG tablet Take 1 tablet (500 mg total) by mouth every 6 (six) hours as needed. (Patient taking differently: Take 500 mg by mouth every 6 (six) hours as needed for moderate pain. ) 30 tablet 0  . aspirin 81 MG chewable tablet Chew 1 tablet (81 mg total) by mouth daily.    . calcium-vitamin D (OSCAL WITH D) 500-200 MG-UNIT per tablet  Take 1 tablet by mouth daily with breakfast.    . diclofenac (FLECTOR) 1.3 % PT[facility] Place 0.5 patches onto the skin 2 (two) times daily as needed (for pain).     Marland Kitchen diltiazem (CARDIZEM CD) 120 MG 24 hr capsule Take 1 capsule (120 mg total) by mouth daily. 30 capsule 0  . docusate sodium (COLACE) 100 MG capsule Take 200 mg by mouth daily as needed.     . Ibuprofen ([employer] IBUPROFEN) 200 MG CAPS Take 1 capsule (200 mg total) by mouth 2 (two) times daily. (Patient taking differently: Take 200 mg by mouth 2 (two) times daily as needed (for pain). ) 120 each 0  . metoprolol succinate (TOPROL-XL) 50 MG 24 hr tablet Take 1 tablet (50 mg total) by mouth daily. Take with or immediately following a meal. 30 tablet 0  . omeprazole (PRILOSEC OTC) 20 MG tablet Take 20 mg by mouth daily.     . predniSONE (DELTASONE) 5 MG tablet Take 5 mg by mouth every morning.     . sennosides-docusate sodium (SENOKOT-S) 8.6-50 MG tablet Take 1 tablet by mouth at bedtime.     No current facility-administered medications for this visit.    Allergies:   Statins   Social History:  The patient  reports that she has never smoked. She does not have any smokeless tobacco history on file. She reports that she does not drink alcohol or use illicit drugs.   Family History:  The patient's family history includes Aneurysm in her mother; Heart attack in her father.  ROS:  Please see the history of present illness.  All other systems are reviewed and otherwise negative.   PHYSICAL EXAM: VS:  BP 124/68 mmHg  Pulse 77  Ht 5\' 2"  (1.575 m)  Wt 115 lb (52.164 kg)  BMI 21.03 kg/m2 BMI: Body mass index is 21.03 kg/(m^2). Well developed, thin WF in no acute distress - I saw her ambulating into the clinic without difficulty. HEENT: normocephalic, atraumatic Neck: no JVD Cardiac:  normal S1, S2; RRR; no murmur Lungs:  clear to auscultation bilaterally, no wheezing, rhonchi or rales Abd: soft, nontender, no hepatomegaly Ext: mild  perimalleolar edema B/L Skin: warm and dry Neuro:  moves all extremities spontaneously, no focal abnormalities noted  EKG: NSR 77bpm, septal infarct age undetermined, nonspecific T wave changes in I, avL, V6. No acute change from prior.  Recent Labs: 12/07/2014: ALT 17; BUN 10; Creatinine, Ser 0.61; Hemoglobin 15.2*; Platelets 239; Potassium 4.2; Sodium 136  No results found for requested labs within last 365 days.   CrCl cannot be calculated (Patient has no serum creatinine result on file.).   Wt Readings from Last 3 Encounters:  11/22/15 115 lb (52.164 kg)  05/28/15 114 lb 6.4 oz (51.891 kg)  02/15/15 115 lb (52.164 kg)    Other studies reviewed: Additional studies/records reviewed today include: hospital records above.   ASSESSMENT AND PLAN:  1. Paroxysmal atrial fibrillation - maintaining  NSR. Continue metoprolol and diltiazem. Refuses anticoagulation,  CHADSVASC score is 7 thus she is at high risk for stroke. I agree with her considering her age and high risk of fall. We'll continue aspirin only. She remains in sinus rhythm today.  2. Chronic diastolic CHF - she appears euvolemic, HCTZ was given as needed, her she was using it daily, hyponatremia, we will obtain labs from her primary care physician, agree with discontinuation of hydrochlorothiazide, however she has on and off lower extremity edema therefore will prescribe Lasix 20 mg daily only as needed when spelling is prominent. Her doctor so decrease the dose of her prednisone as this might cause hyponatremia as well.  3. Essential HTN - controlled.  4. Pulm HTN - PASP mildly elevated by recent echo. Follow clinically.  5. PMR - on chronic prednisone for this.  6. Lost of taste and smell, I have reviewed her medicines with the pharmacist and there is no medication that could cause that it's probably related to age.  Follow up in 3 months.   Signed, Ena Dawley, MD  11/22/2015 1:45 PM     [place] [address] [phone] (office)  [phone] (fax)

## 2015-11-26 ENCOUNTER — Encounter: Payer: Self-pay | Admitting: Cardiology

## 2015-12-02 ENCOUNTER — Emergency Department (HOSPITAL_COMMUNITY)
Admission: EM | Admit: 2015-12-02 | Discharge: 2015-12-02 | Disposition: A | Discharge status: Home / Self Care | Payer: Medicare Other | Attending: Emergency Medicine | Admitting: Emergency Medicine

## 2015-12-02 ENCOUNTER — Emergency Department (HOSPITAL_COMMUNITY): Payer: Medicare Other

## 2015-12-02 ENCOUNTER — Encounter (HOSPITAL_COMMUNITY): Payer: Self-pay | Admitting: Emergency Medicine

## 2015-12-02 DIAGNOSIS — Z7982 Long term (current) use of aspirin: Secondary | ICD-10-CM | POA: Diagnosis not present

## 2015-12-02 DIAGNOSIS — Z85038 Personal history of other malignant neoplasm of large intestine: Secondary | ICD-10-CM | POA: Insufficient documentation

## 2015-12-02 DIAGNOSIS — R0602 Shortness of breath: Secondary | ICD-10-CM | POA: Diagnosis present

## 2015-12-02 DIAGNOSIS — I509 Heart failure, unspecified: Secondary | ICD-10-CM

## 2015-12-02 DIAGNOSIS — I10 Essential (primary) hypertension: Secondary | ICD-10-CM | POA: Diagnosis not present

## 2015-12-02 DIAGNOSIS — R0600 Dyspnea, unspecified: Secondary | ICD-10-CM

## 2015-12-02 DIAGNOSIS — Z8639 Personal history of other endocrine, nutritional and metabolic disease: Secondary | ICD-10-CM | POA: Diagnosis not present

## 2015-12-02 DIAGNOSIS — Z8673 Personal history of transient ischemic attack (TIA), and cerebral infarction without residual deficits: Secondary | ICD-10-CM | POA: Diagnosis not present

## 2015-12-02 DIAGNOSIS — Z79899 Other long term (current) drug therapy: Secondary | ICD-10-CM | POA: Insufficient documentation

## 2015-12-02 DIAGNOSIS — I5033 Acute on chronic diastolic (congestive) heart failure: Secondary | ICD-10-CM | POA: Diagnosis not present

## 2015-12-02 DIAGNOSIS — M7981 Nontraumatic hematoma of soft tissue: Secondary | ICD-10-CM | POA: Diagnosis not present

## 2015-12-02 DIAGNOSIS — I480 Paroxysmal atrial fibrillation: Secondary | ICD-10-CM | POA: Insufficient documentation

## 2015-12-02 DIAGNOSIS — M1990 Unspecified osteoarthritis, unspecified site: Secondary | ICD-10-CM | POA: Insufficient documentation

## 2015-12-02 DIAGNOSIS — Z7952 Long term (current) use of systemic steroids: Secondary | ICD-10-CM | POA: Diagnosis not present

## 2015-12-02 LAB — CBC
HCT: 36.4 % (ref 36.0–46.0)
Hemoglobin: 12 g/dL (ref 12.0–15.0)
MCH: 31.6 pg (ref 26.0–34.0)
MCHC: 33 g/dL (ref 30.0–36.0)
MCV: 95.8 fL (ref 78.0–100.0)
Platelets: 225 10*3/uL (ref 150–400)
RBC: 3.8 MIL/uL — ABNORMAL LOW (ref 3.87–5.11)
RDW: 14.4 % (ref 11.5–15.5)
WBC: 8.1 10*3/uL (ref 4.0–10.5)

## 2015-12-02 LAB — BASIC METABOLIC PANEL
Anion gap: 9 (ref 5–15)
BUN: 18 mg/dL (ref 6–20)
CHLORIDE: 100 mmol/L — AB (ref 101–111)
CO2: 22 mmol/L (ref 22–32)
CREATININE: 0.87 mg/dL (ref 0.44–1.00)
Calcium: 8.9 mg/dL (ref 8.9–10.3)
GFR calc Af Amer: >60 mL/min (ref >60)
GFR calc non Af Amer: 55 mL/min — ABNORMAL LOW (ref >60)
GLUCOSE: 97 mg/dL (ref 65–99)
POTASSIUM: 3.9 mmol/L (ref 3.5–5.1)
SODIUM: 131 mmol/L — AB (ref 135–145)

## 2015-12-02 LAB — I-STAT TROPONIN, ED: Troponin i, poc: 0.02 ng/mL (ref 0.00–0.08)

## 2015-12-02 LAB — BRAIN NATRIURETIC PEPTIDE: B Natriuretic Peptide: 289.6 pg/mL — ABNORMAL HIGH (ref 0.0–100.0)

## 2015-12-02 MED ORDER — FUROSEMIDE 10 MG/ML IJ SOLN
20.0000 mg | Freq: Once | INTRAMUSCULAR | Status: AC
  Administered 2015-12-02: 20 mg via INTRAVENOUS
  Filled 2015-12-02: qty 2

## 2015-12-02 MED ORDER — POTASSIUM CHLORIDE CRYS ER 20 MEQ PO TBCR
40.0000 meq | EXTENDED_RELEASE_TABLET | Freq: Once | ORAL | Status: AC
  Administered 2015-12-02: 40 meq via ORAL
  Filled 2015-12-02: qty 2

## 2015-12-02 NOTE — ED Notes (Signed)
Pt ambulated with RN and MD [place] with pulse ox around nurses station; initial O2 sat was 94% and patient maintained O2 sat at 94% throughout ambulation however had significantly increased WOB

## 2015-12-02 NOTE — ED Notes (Signed)
Pt returned to room D31 from xray with transporter; placed back on cardiac monitor and VS updated

## 2015-12-02 NOTE — ED Notes (Signed)
Patient transported to X-ray 

## 2015-12-02 NOTE — ED Notes (Signed)
Pt presents from home with [facility] for SOB increasing over the last week; pt reports hx of CHF and Afib; pt was taken off of HCTZ per provider orders on (11/22/15) and placed on lasix PRN for lower leg edema; VSS enroute; labored breathing with exertion and talking noted

## 2015-12-02 NOTE — ED Provider Notes (Signed)
CSN: [id number]     Arrival date & time 12/02/15  1946 History   First MD Initiated Contact with Patient 12/02/15 1954     Chief Complaint  Patient presents with  . Shortness of Breath  . Atrial Fibrillation   Patient is a 82 y.o. female presenting with shortness of breath and atrial fibrillation. The history is provided by the patient and a relative.  Shortness of Breath Severity:  Mild Onset quality:  Gradual Duration:  2 weeks Timing:  Intermittent Progression:  Worsening Chronicity:  Recurrent Context: activity   Context: not URI   Relieved by:  Rest Worsened by:  Exertion and movement Ineffective treatments:  Diuretics Associated symptoms: cough (resolved)   Associated symptoms: no abdominal pain, no chest pain, no diaphoresis, no fever, no hemoptysis, no PND, no rash, no sputum production, no vomiting and no wheezing   Associated symptoms comment:  +orthopnea and DOE worsening.  Maybe a 3-5 lb weight gain in past 2 weeks Atrial Fibrillation Associated symptoms include coughing (resolved). Pertinent negatives include no abdominal pain, chest pain, chills, diaphoresis, fever, nausea, rash or vomiting.   Was on HCTZ but stopped due to hyponatremia and started Lasix PRN.     Past Medical History  Diagnosis Date  . Hypertension   . Arthritis   . PAF (paroxysmal atrial fibrillation) ([place])   . Back pain   . Colon cancer ([place])   . Polymyalgia rheumatica ([place])   . Hypoglycemia   . HTN (hypertension) 08/08/2013  . Stroke ([place])   . Chronic diastolic CHF (congestive heart failure) ([place])   . Pulmonary hypertension ([place])     a. Echo 07/2014 - mildly increased PASP.   Past Surgical History  Procedure Laterality Date  . Colon surgery    . Cesarean section    . Bilateral oophorectomy    . Eye surgery     Family History  Problem Relation Age of Onset  . Aneurysm Mother   . Heart attack Father    Social History  Substance Use Topics  . Smoking status: Never Smoker   .  Smokeless tobacco: None  . Alcohol Use: No   OB History    No data available     Review of Systems  Constitutional: Positive for activity change and unexpected weight change. Negative for fever, chills and diaphoresis.  Respiratory: Positive for cough (resolved) and shortness of breath. Negative for hemoptysis, sputum production and wheezing.   Cardiovascular: Positive for leg swelling (resolving with leg elevation). Negative for chest pain and PND.  Gastrointestinal: Negative for nausea, vomiting and abdominal pain.  Genitourinary: Negative for decreased urine volume.  Musculoskeletal: Negative for back pain.  Skin: Negative for rash.  Neurological: Negative for syncope.  All other systems reviewed and are negative.     Allergies  Chocolate and Statins  Home Medications   Prior to Admission medications   Medication Sig Start Date End Date Taking? Authorizing Provider  acetaminophen (TYLENOL) 500 MG tablet Take 1 tablet (500 mg total) by mouth every 6 (six) hours as needed. Patient taking differently: Take 500 mg by mouth every 6 (six) hours as needed for moderate pain.  06/15/14   Erlene Quan, PA-C  aspirin 81 MG chewable tablet Chew 1 tablet (81 mg total) by mouth daily. 08/19/13   Lavon Paganini Angiulli, PA-C  calcium-vitamin D (OSCAL WITH D) 500-200 MG-UNIT per tablet Take 1 tablet by mouth daily with breakfast.    Historical Provider, MD  diclofenac (FLECTOR) 1.3 %  [place] 0.5 patches onto the skin 2 (two) times daily as needed (for pain).     Historical Provider, MD  diltiazem (CARDIZEM CD) 120 MG 24 hr capsule take 1 capsule by mouth once daily 08/09/15   Dorothy Spark, MD  docusate sodium (COLACE) 100 MG capsule Take 200 mg by mouth daily.     Historical Provider, MD  furosemide (LASIX) 20 MG tablet Take 1 tablet (20 mg total) by mouth daily as needed for edema. 11/22/15   Dorothy Spark, MD  metoprolol succinate (TOPROL-XL) 50 MG 24 hr tablet Take 1 tablet (50 mg  total) by mouth daily. Take with or immediately following a meal. 09/17/15   Dorothy Spark, MD  omeprazole (PRILOSEC OTC) 20 MG tablet Take 20 mg by mouth daily.     Historical Provider, MD  predniSONE (DELTASONE) 5 MG tablet Take 5 mg by mouth every morning.     Historical Provider, MD  sennosides-docusate sodium (SENOKOT-S) 8.6-50 MG tablet Take 1 tablet by mouth daily as needed for constipation.     Historical Provider, MD   BP 149/87 mmHg  Pulse 72  Temp(Src) 98.1 F (36.7 C) (Oral)  Resp 16  Ht 5\' 2"  (1.575 m)  Wt 52.617 kg  BMI 21.21 kg/m2  SpO2 93% Physical Exam  Constitutional: She is oriented to person, place, and time. She appears well-developed and well-nourished. No distress.  Elderly, appears younger than stated age, well appearing, hgih energy  HENT:  Head: Normocephalic and atraumatic.  Nose: Nose normal.  Eyes: Conjunctivae are normal.  No conj pallor  Neck: Normal range of motion. Neck supple. JVD (only 2 cm) present. No tracheal deviation present.  Cardiovascular: Normal rate, regular rhythm and normal heart sounds.   No murmur heard. Pulmonary/Chest: Effort normal and breath sounds normal. No respiratory distress. She has no wheezes. She has no rales.  Abdominal: Soft. Bowel sounds are normal. She exhibits no distension and no mass. There is no tenderness.  Musculoskeletal: Normal range of motion. She exhibits edema (1+ to lower calf bilaterally).  Neurological: She is alert and oriented to person, place, and time.  Skin: Skin is warm and dry. No rash noted.  Scattered ecchymoses  Psychiatric: She has a normal mood and affect.  Nursing note and vitals reviewed.   ED Course  Procedures (including critical care time) Labs Review Labs Reviewed  BASIC METABOLIC PANEL - Abnormal; Notable for the following:    Sodium 131 (*)    Chloride 100 (*)    GFR calc non Af Amer 55 (*)    All other components within normal limits  CBC - Abnormal; Notable for the  following:    RBC 3.80 (*)    All other components within normal limits  BRAIN NATRIURETIC PEPTIDE - Abnormal; Notable for the following:    B Natriuretic Peptide 289.6 (*)    All other components within normal limits  I-STAT TROPOININ, ED    Imaging Review Dg Chest 2 View  12/02/2015  CLINICAL DATA:  Shortness of Breath EXAM: CHEST  2 VIEW COMPARISON:  07/24/2014 FINDINGS: Cardiac shadow is mildly enlarged. A hiatal hernia is again noted. Diffuse interstitial changes are noted bilaterally although stable nature. No acute infiltrate is seen. IMPRESSION: Chronic changes without acute abnormality. Electronically Signed   By: Inez Catalina M.D.   On: 12/02/2015 20:36   I have personally reviewed and evaluated these images and lab results as part of my medical decision-making.   EKG Interpretation  None      MDM   Final diagnoses:  Dyspnea  Acute on chronic congestive heart failure, unspecified congestive heart failure type [facility])   Patient presents with shortness of breath and bilateral lotion edema. The symptoms have been improving throughout the day. EKG with normal sinus rhythm, PVC, no ST segment changes. Denies chest pain. Initial troponin negative. I have low suspicion for acute coronary syndrome given the lack of chest pain and benign EKG for ischemia. Doubt PE as part diagnosis of CHF exacerbation most likely given consultation symptoms. There is no hypoxia, tachycardia or signs or symptoms DVT. Symptoms classically consistent with CHF with orthopnea DOE and lower extremity edema. She was able him later in the department without desaturations or increased respiratory effort. Hemoglobin stable. CXR wo PNA or PTX, no significant edema.  Unchanged compared to prior.  Clinically appears euvolemic.  Gave one dose of IV Lasix here she has appointment in 3 days for reexamination. She is cleared medically from an emergent standpoint and can be safely discharged home. I counseled on strict return  cautious for worsening condition, shortness of breath, chest pain.     Tammy Sours, MD 12/03/15 [id number]  Merrily Pew, MD 12/03/15 [id number]

## 2015-12-21 ENCOUNTER — Ambulatory Visit: Payer: Medicare Other

## 2015-12-24 ENCOUNTER — Encounter: Payer: Self-pay | Admitting: Podiatry

## 2015-12-24 ENCOUNTER — Ambulatory Visit (INDEPENDENT_AMBULATORY_CARE_PROVIDER_SITE_OTHER): Payer: Medicare Other | Admitting: Podiatry

## 2015-12-24 DIAGNOSIS — L97521 Non-pressure chronic ulcer of other part of left foot limited to breakdown of skin: Secondary | ICD-10-CM

## 2015-12-24 DIAGNOSIS — B351 Tinea unguium: Secondary | ICD-10-CM

## 2015-12-24 DIAGNOSIS — M79676 Pain in unspecified toe(s): Secondary | ICD-10-CM | POA: Diagnosis not present

## 2015-12-25 ENCOUNTER — Telehealth: Payer: Self-pay | Admitting: *Deleted

## 2015-12-25 NOTE — Progress Notes (Signed)
Patient ID: Krystal Hensley, female   DOB: 02-19-1918, 82 y.o.   MRN: [id number]  Subjective: 82 year old female presents the office today for concerns of infection the left third toe at started last week. She went to her primary care physician and she was started on antibiotics which she remains on. She states the third toe was swollen and red. She states that she got an antibiotic injection as well as an antibiotic by mouth that she started on Saturday. The redness and swelling has decreased although she does have a lesion/callus on the third toe and she is concerned that her toenails to be amputated potentially. Denies any systemic complaints such as fevers, chills, nausea, vomiting. No acute changes since last appointment, and no other complaints at this time.   Objective: AAO x3, NAD DP/PT pulses palpable bilaterally, CRT less than 3 seconds There is a severe HAV present on the hallux is overlapping the second third digits. There is hammertoe contractures of lesser digits. On the lateral aspect of the third digit there is a hyperkeratotic lesion, dried bulla. Upon debridement there was superficial granular wound however there is no probing, undermining or tunneling. There is no exposed bone. There is edema to the toe however minimal and there is faint erythema. There is no ascending cellulitis. No fluctuance or crepitus. No malodor. Nails are hypertrophic, dystrophic, brittle, discolored, elongated 10. No strenuous redness or drainage. Tenderness to nails 1-5 bilaterally. No areas of pinpoint bony tenderness or pain with vibratory sensation. MMT 5/5, ROM WNL. No edema, erythema, increase in warmth to bilateral lower extremities.  No open lesions or pre-ulcerative lesions.  No pain with calf compression, swelling, warmth, erythema  Assessment: Left third toe infection with ulceration; symptomatic onychomycosis  Plan: -All treatment options discussed with the patient including all  alternatives, risks, complications.  -Hyperkeratotic lesion was debrided without complications. Continue antibiotic ointment dressing changes daily. Continue with antibiotics per her primary care physician. Offloading to the area. We showed her how to change her bandages daily. Monitor for signs or symptoms of infection and directed to the ER should any occur. Otherwise follow with me as scheduled or sooner if needed. -Nails debrided 10 without complications or bleeding. -Patient encouraged to call the office with any questions, concerns, change in symptoms.   Celesta Gentile, DPM

## 2015-12-25 NOTE — Telephone Encounter (Signed)
Pt states had infected toe worked on and she is to dress the toe daily, but can she shower.  I told pt she should shower before the dressings.

## 2015-12-26 ENCOUNTER — Ambulatory Visit: Payer: Medicare Other | Admitting: Podiatry

## 2016-01-02 ENCOUNTER — Ambulatory Visit (INDEPENDENT_AMBULATORY_CARE_PROVIDER_SITE_OTHER): Payer: Medicare Other | Admitting: Podiatry

## 2016-01-02 ENCOUNTER — Encounter: Payer: Self-pay | Admitting: Podiatry

## 2016-01-02 DIAGNOSIS — M2042 Other hammer toe(s) (acquired), left foot: Secondary | ICD-10-CM | POA: Diagnosis not present

## 2016-01-02 DIAGNOSIS — L97521 Non-pressure chronic ulcer of other part of left foot limited to breakdown of skin: Secondary | ICD-10-CM | POA: Diagnosis not present

## 2016-01-02 DIAGNOSIS — M2012 Hallux valgus (acquired), left foot: Secondary | ICD-10-CM

## 2016-01-03 NOTE — Progress Notes (Signed)
Patient ID: Krystal Hensley, female   DOB: 1917-10-03, 82 y.o.   MRN: [id number]  Subjective: 82 year old female presents the office today for Follow-up evaluation of left third toe ulceration, cellulitis. She states that she has finished the antibiotics. Overall she thinks of the toe is looking much better. She did have some bleeding today which her sock on she felt that a piece of skin got caught. She denies any swelling to her foot. Denies any systemic complaints such as fevers, chills, nausea, vomiting. No acute changes since last appointment, and no other complaints at this time.   Objective: AAO x3, NAD DP/PT pulses palpable bilaterally, CRT less than 3 seconds There is a severe HAV present on the hallux is overlapping the second third digits. There is hammertoe contractures of lesser digits. On the lateral distal aspect of the third digit there is a hyperkeratotic lesion. Upon the remnants a there is no significant ulceration present. The areas pre-ulcerative however. There is minimal erythema to the distal aspect of the toe however this does appear to be improved compared to last appointment. There is no ascending cellulitis. No fluctuance or crepitus. Small pre-ulcer lesions also present the distal aspect of the left fourth toe. Upon debrided and no underlying ulceration, drainage or other signs of infection. No other open lesions or pre-ulcerative lesions are identified at this time. No pain with calf compression, swelling, warmth, erythema  Assessment: Left third toeresolving infection, ulceration with pre-ulcerative lesion left fourth toe with multiple digital deformities   Plan: -All treatment options discussed with the patient including all alternatives, risks, complications.  -Lesion debrided to the third and fourth toe without couple complications. Continue antibiotic ointment to the third toe for now. We'll hold off on any further antibiotics as the erythema appears to be resolving.  Continue offloading. Also discussed shoe gear modifications including extra-depth shoe with a soft material to the toe. -Addition signs or symptoms of recurrence or worsening of infection to call the office immediately or go to the ER. -I'll see her back as scheduled for recheck of the wound. In the meantime encouraged to call any questions or concerns in the meantime.  Celesta Gentile, DPM

## 2016-01-14 ENCOUNTER — Telehealth: Payer: Self-pay | Admitting: *Deleted

## 2016-01-14 NOTE — Telephone Encounter (Addendum)
Pt states she is having a problem with infected toe. Pt states Dr. Jacqualyn Posey had wanted her to allow the area to dry out and use neosporin only every 3rd day, but the toe is still red, no open area or drainage.  I told pt she could also add an epsom salt soak on the day she used the neosporin and that would help the area dry as well.  Pt asked if she needed to wear socks with the new extra deep shoes.  I told her that would help cushion the toe also, and if she should have worsening symptoms or drainage to call for an earlier appt.

## 2016-01-23 ENCOUNTER — Encounter: Payer: Self-pay | Admitting: Podiatry

## 2016-01-23 ENCOUNTER — Ambulatory Visit (INDEPENDENT_AMBULATORY_CARE_PROVIDER_SITE_OTHER): Payer: Medicare Other | Admitting: Podiatry

## 2016-01-23 DIAGNOSIS — M2042 Other hammer toe(s) (acquired), left foot: Secondary | ICD-10-CM

## 2016-01-23 DIAGNOSIS — L97521 Non-pressure chronic ulcer of other part of left foot limited to breakdown of skin: Secondary | ICD-10-CM | POA: Diagnosis not present

## 2016-01-24 NOTE — Progress Notes (Signed)
Patient ID: Krystal Hensley, female   DOB: 1917/07/12, 82 y.o.   MRN: [id number]  Subjective: 82 year old female presents the office today for follow-up evaluation of ulceration and Silastic left third toe. Since last when she feels that the redness has resolved and she did purchase new shoes which is been helping. However the shoe gear of the big toe. Denies any drainage pus any redness or swelling.Denies any systemic complaints such as fevers, chills, nausea, vomiting. No acute changes since last appointment, and no other complaints at this time.   Objective: AAO x3, NAD DP/PT pulses palpable bilaterally, CRT less than 3 seconds There is a severe HAV present on the hallux is overlapping the second third digits. There is hammertoe contractures of lesser digits. On the lateral distal aspect of the third digit there is a hyperkeratotic lesion. Upon debridement there is a very superficial granular wound today. There is no probing, undermining or tunneling. There is no swelling erythema, ascending cellulitis, fluctuance, crepitus, malodor. Small pre-ulcerative lesion to the distal aspect of the third toe. No other Ulceration, drainage at this time. Small superficial wound the dorsal aspect of the hallux IPJ as was rubbing his shoes. Again no probing, undermining or tunneling anyrednessordrainage.Nosignsofinfection.Nootheropenlesionsorpre-ulcerlesionsidentifiedatthistime. No pain with calf compression, swelling, warmth, erythema  Assessment: Left foot resolved infection, ulcerations second toe, pre-ulcerative lesion third toe, ulcer hallux dorsally  Plan: -All treatment options discussed with the patient including all alternatives, risks, complications.  -Lesions were debrided without couple complications. Recommend and buttock ointment overlying the wounds daily. Also dispensed offloading pads. Monitor to see if this is causing issues in shoe gear and if so to change. Also her back and 4 weeks or sooner  if any issues are to arise. Monitor for infection to the ER should any occur. Call any questions or concerns in the meantime.  Celesta Gentile, DPM

## 2016-02-27 ENCOUNTER — Ambulatory Visit (INDEPENDENT_AMBULATORY_CARE_PROVIDER_SITE_OTHER): Payer: Medicare Other | Admitting: Podiatry

## 2016-02-27 ENCOUNTER — Encounter: Payer: Self-pay | Admitting: Podiatry

## 2016-02-27 DIAGNOSIS — L97521 Non-pressure chronic ulcer of other part of left foot limited to breakdown of skin: Secondary | ICD-10-CM | POA: Diagnosis not present

## 2016-02-27 DIAGNOSIS — B351 Tinea unguium: Secondary | ICD-10-CM

## 2016-02-27 DIAGNOSIS — M79676 Pain in unspecified toe(s): Secondary | ICD-10-CM | POA: Diagnosis not present

## 2016-02-28 ENCOUNTER — Telehealth: Payer: Self-pay | Admitting: *Deleted

## 2016-02-28 NOTE — Telephone Encounter (Signed)
Can we add on home health for dressing changes to the left foot to do silvadene (if no allergy) dressing changes. They are coming for her leg wound that her PCP set up. I told her daughter we needed to find out what company it was. Thanks.

## 2016-02-28 NOTE — Telephone Encounter (Addendum)
Pt's dtr, Nila Nephew states Dr. Jacqualyn Posey asked her to leave information with [facility].  I called the phone number left by Charlene and left message to call again. Charlene called again states pt has [facility] with [facility], and her contact is [place]. 02/29/2016-DrJacqualyn Posey ordered clean left foot 3xweek with wound cleanse, dress with Silvadene and cover with sterile gauze and coflex. Orders called to Tim - [facility]. 03/05/2016-Michael - [facility] asked if the Silvadene which pt does not have a rx for could be switched for [employer], because pt states her insurance does not cover her medications much and [facility] can get and Medicare would cover.  I told Legrand Como to begin using the Maxsorb AG 3x week and  I would inform Dr. Jacqualyn Posey.

## 2016-02-28 NOTE — Progress Notes (Signed)
Subjective: 82 y.o. returns the office today for painful, elongated, thickened toenails which she cannot trim herself. Denies any redness or drainage around the nails. She also states that she is given a callus of the second and third toe on the left foot. Denies a drainage or swelling or any redness. She did hit her left leg and she went to her primary care Dr. Elsie Lincoln the home health nurse to her house. Denies any acute changes since last appointment and no new complaints today. Denies any systemic complaints such as fevers, chills, nausea, vomiting.   Objective: AAO 3, NAD DP/PT pulses palpable, CRT less than 3 seconds Nails hypertrophic, dystrophic, elongated, brittle, discolored 10. There is tenderness overlying the nails 1-5 bilaterally. There is no surrounding erythema or drainage along the nail sites. Hyperkeratotic lesion to the distal aspect of the second third toes on the left foot. Upon debridement the second toe there was superficial granular wound measuring/0.6 x 0.4 cm there is no probing, undermining or tunneling. There is no swelling erythema, ascending cellulitis, fluctuance, crepitus, malodor. No drainage or pus. No other open lesions are identified this time. There are significant hammertoe and HAV present with left side worse than the right. No open lesions or pre-ulcerative lesions are identified. No other areas of tenderness bilateral lower extremities. No overlying edema, erythema, increased warmth. No pain with calf compression, swelling, warmth, erythema.  Assessment: Patient presents with symptomatic onychomycosis; ulceration left toe  Plan: -Treatment options including alternatives, risks, complications were discussed -Nails sharply debrided 10 without complication/bleeding. -Hyperkeratotic lesion/ulceration was debrided and left second toe. Recommended daily dressing changes with antibiotic ointment for now. When she gets her home health nurse I can add in order to  have them change the bandage the left foot daily as well. She states that she will call the office we can get orders and for this. Offloading pads were dispensed. -Discussed daily foot inspection. If there are any changes, to call the office immediately.  -Follow-up as scheduled or sooner if any problems are to arise. In the meantime, encouraged to call the office with any questions, concerns, changes symptoms.  Celesta Gentile, DPM

## 2016-03-11 ENCOUNTER — Encounter: Payer: Self-pay | Admitting: Cardiology

## 2016-03-18 ENCOUNTER — Ambulatory Visit: Payer: Medicare Other | Admitting: Podiatry

## 2016-03-25 ENCOUNTER — Ambulatory Visit (INDEPENDENT_AMBULATORY_CARE_PROVIDER_SITE_OTHER): Payer: Medicare Other | Admitting: Podiatry

## 2016-03-25 ENCOUNTER — Encounter: Payer: Self-pay | Admitting: Podiatry

## 2016-03-25 DIAGNOSIS — L84 Corns and callosities: Secondary | ICD-10-CM | POA: Diagnosis not present

## 2016-03-25 DIAGNOSIS — L89891 Pressure ulcer of other site, stage 1: Secondary | ICD-10-CM

## 2016-03-25 DIAGNOSIS — L97521 Non-pressure chronic ulcer of other part of left foot limited to breakdown of skin: Secondary | ICD-10-CM

## 2016-03-25 NOTE — Progress Notes (Signed)
Subjective: 82 y.o. returns the office today for follow-up evaluation of the wound to left third toe. She has had home health care nurse come to the house for dressing changes 3 times a week. She feels that the wound has gotten better. Denies any drainage or pus any redness or swelling.  Denies any acute changes since last appointment and no new complaints today. Denies any systemic complaints such as fevers, chills, nausea, vomiting.   Objective: AAO 3, NAD DP/PT pulses palpable, CRT less than 3 seconds Hyperkeratotic lesion to the distal aspect of the second third toes on the left foot. Upon debridement the second toe there was superficial granular wound measuring 0.4 x 0.4 cm there is no probing, undermining or tunneling. There is no surrounding erythema, ascending cellulitis, fluctuance, crepitus, malodor. No drainage or pus. No other open lesions are identified this time. There are significant hammertoe and HAV present with left side worse than the right.  Hyperkeratotic lesion is present on the right foot and between the fourth and fifth toes. Upon debridement no underlying ulceration, drainage or any signs of infection. Hammertoes are present. No open lesions or pre-ulcerative lesions are identified. No other areas of tenderness bilateral lower extremities. No overlying edema, erythema, increased warmth. No pain with calf compression, swelling, warmth, erythema.  Assessment: Patient presents with ulceration left toe with some improvement.   Plan: -Treatment options including alternatives, risks, complications were discussed -Hyperkeratotic lesion, ulceration was sharply debrided to without complications to granular wound base. Continue with home healthcare daily dressing changes with silver collagen. She is a bandage on her left leg that she does not wish to have removed today as she is having active treatment for this wound. -Hyperkeratotic lesions right foot debrided without complications  or bleeding 2. Offloading pads dispensed. -Monitor for signs or symptoms of infection to the arch any further.  Celesta Gentile, DPM

## 2016-03-26 ENCOUNTER — Ambulatory Visit (INDEPENDENT_AMBULATORY_CARE_PROVIDER_SITE_OTHER): Payer: Medicare Other | Admitting: Cardiology

## 2016-03-26 ENCOUNTER — Encounter (INDEPENDENT_AMBULATORY_CARE_PROVIDER_SITE_OTHER): Payer: Self-pay

## 2016-03-26 VITALS — BP 128/68 | HR 80 | Ht 62.0 in | Wt 113.0 lb

## 2016-03-26 DIAGNOSIS — I4891 Unspecified atrial fibrillation: Secondary | ICD-10-CM

## 2016-03-26 DIAGNOSIS — I5032 Chronic diastolic (congestive) heart failure: Secondary | ICD-10-CM | POA: Diagnosis not present

## 2016-03-26 DIAGNOSIS — I480 Paroxysmal atrial fibrillation: Secondary | ICD-10-CM

## 2016-03-26 DIAGNOSIS — I10 Essential (primary) hypertension: Secondary | ICD-10-CM

## 2016-03-26 DIAGNOSIS — R252 Cramp and spasm: Secondary | ICD-10-CM

## 2016-03-26 MED ORDER — MAGNESIUM OXIDE -MG SUPPLEMENT 200 MG PO TABS: 200.0000 mg | ORAL_TABLET | Freq: Every day | ORAL | 3 refills | Status: DC

## 2016-03-26 MED ORDER — MAGNESIUM OXIDE -MG SUPPLEMENT 200 MG PO TABS
ORAL_TABLET | ORAL | 0 refills | Status: DC
Start: 2016-03-26 — End: 2016-12-04

## 2016-03-26 MED ORDER — MAGNESIUM OXIDE -MG SUPPLEMENT 200 MG PO TABS: ORAL_TABLET | ORAL | 0 refills | Status: DC

## 2016-03-26 NOTE — Patient Instructions (Addendum)
Medication Instructions:   START TAKING MAGNESIUM OXIDE 200 MG BY MOUTH DAILY FOR 5 DAYS ONLY, THEN YOU MAY TAKE MAGNESIUM OXIDE 200 MG ONCE DAILY ONLY AS NEEDED FOR MUSCLE CRAMPS THEREAFTER.    Follow-Up:  Your physician wants you to follow-up in: [place] will receive a reminder letter in the mail two months in advance. If you don't receive a letter, please call our office to schedule the follow-up appointment.      If you need a refill on your cardiac medications before your next appointment, please call your pharmacy.

## 2016-03-26 NOTE — Progress Notes (Signed)
Patient ID: Krystal Hensley, female   DOB: 1917-07-13, 82 y.o.   MRN: [id number]     Cardiology Office Note Date:  03/26/2016  Patient ID:  Krystal Hensley, DOB [birth year], MRN [id number] PCP:  [name], MD  Cardiologist:  Meda Coffee   Chief Complaint: PND, LE edema  History of Present Illness: Krystal Hensley is a 82 y.o. female with history of CVA, HTN, PAF (not on anticoagulation due to fall risk/advanced age/patient choice), PMR, arthritis, pulm HTN, chronic diastolic CHF who presents for post-hospital follow-up. She was admitted to [facility] 1/18-1/20 with SOB, abdominal pain and RQU pain. She was found to be in AF with RVR, converted in the ED, then reverted back into atrial fibrillation/flutter with RVR. Metoprolol was increased and she returned to normal rhythm. Abd [place] normal. Troponins were normal. Labwork otherwise grossly unremarkable including normal TSH, normal Hgb, BMET OK. She also received low dose IV Lasix for acute on chronic diastolic CHF with improvement in breathing. 2D echo 07/25/14: mild LVH, mild focal basal hypertrophy of septum, EF 65-70%, no RWMA, mildly increased PASP, moderate LAE, mild MR, AI and TR. There was mention per hospital notes to revisit the topic of anticoagulation as an outpatient.  She has been doing well since hospital discharge. She is remarkably sharp mentally. She has been working with PT to increase strength at home. No recent falls, but she remains leery of starting a blood thinner. She reports that she's an easy bleeder if she injures or scratches herself. She denies orthopnea, LEE, PND, SOB, or chest pain. She is in NSR today. She requests refills on her metoprolol and diltiazem.  Her salient complaint is that of constipation which she wonders is related to her heart meds. Prune juice doesn't seem to help. She tried Miralax but it made her go too much. She is taking Senna nightly but now has inconsistency in constipation alternating with urgency  to go. No bleeding.  05/28/2015 - the patient is coming after 3 months, she feels well and denies any palpitations or falls. She walks with a cane. She denied anticoagulation at the last visit. She is still very active, walking with a cane, no chest pain, stable dyspnea on moderate exertion. Occasional abdominal and left neck pain. No LE edema.   03/26/16 - patient is coming after 6 months, she has been doing well, she has been experiencing some LE cramping and weakness. She otherwise denies chest pain, SOB, no orthopnea, PND, she complains of loss of taste and smell and not enjoying her food.  No recent palpitations, no syncope.   Past Medical History:  Diagnosis Date  . Arthritis   . Back pain   . Chronic diastolic CHF (congestive heart failure) ([place])   . Colon cancer ([place])   . HTN (hypertension) 08/08/2013  . Hypertension   . Hypoglycemia   . PAF (paroxysmal atrial fibrillation) ([place])   . Polymyalgia rheumatica ([place])   . Pulmonary hypertension ([place])    a. Echo 07/2014 - mildly increased PASP.  Marland Kitchen Stroke [facility])     Past Surgical History:  Procedure Laterality Date  . BILATERAL OOPHORECTOMY    . CESAREAN SECTION    . COLON SURGERY    . EYE SURGERY      Current Outpatient Prescriptions  Medication Sig Dispense Refill  . acetaminophen (TYLENOL) 500 MG tablet Take 1 tablet (500 mg total) by mouth every 6 (six) hours as needed. (Patient taking differently: Take 500 mg by mouth every 6 (six)  hours as needed for moderate pain. ) 30 tablet 0  . aspirin 81 MG chewable tablet Chew 1 tablet (81 mg total) by mouth daily.    . calcium-vitamin D (OSCAL WITH D) 500-200 MG-UNIT per tablet Take 1 tablet by mouth daily with breakfast.    . diclofenac (FLECTOR) 1.3 % PTCH Place 0.5 patches onto the skin 2 (two) times daily as needed (for pain).     Marland Kitchen diltiazem (CARDIZEM CD) 120 MG 24 hr capsule Take 1 capsule (120 mg total) by mouth daily. 30 capsule 0  . docusate sodium (COLACE) 100 MG capsule Take  200 mg by mouth daily as needed.     . Ibuprofen ([employer] IBUPROFEN) 200 MG CAPS Take 1 capsule (200 mg total) by mouth 2 (two) times daily. (Patient taking differently: Take 200 mg by mouth 2 (two) times daily as needed (for pain). ) 120 each 0  . metoprolol succinate (TOPROL-XL) 50 MG 24 hr tablet Take 1 tablet (50 mg total) by mouth daily. Take with or immediately following a meal. 30 tablet 0  . omeprazole (PRILOSEC OTC) 20 MG tablet Take 20 mg by mouth daily.     . predniSONE (DELTASONE) 5 MG tablet Take 5 mg by mouth every morning.     . sennosides-docusate sodium (SENOKOT-S) 8.6-50 MG tablet Take 1 tablet by mouth at bedtime.     No current facility-administered medications for this visit.    Allergies:   Chocolate and Statins   Social History:  The patient  reports that she has never smoked. She has never used smokeless tobacco. She reports that she does not drink alcohol or use drugs.   Family History:  The patient's family history includes Aneurysm in her mother; Heart attack in her father.  ROS:  Please see the history of present illness.  All other systems are reviewed and otherwise negative.   PHYSICAL EXAM: VS:  BP 128/68   Pulse 80   Ht 5\' 2"  (1.575 m)   Wt 113 lb (51.3 kg)   BMI 20.67 kg/m  BMI: Body mass index is 20.67 kg/m. Well developed, thin WF in no acute distress - I saw her ambulating into the clinic without difficulty.  HEENT: normocephalic, atraumatic  Neck: no JVD  Cardiac:  normal S1, S2; RRR; no murmur  Lungs:  clear to auscultation bilaterally, no wheezing, rhonchi or rales  Abd: soft, nontender, no hepatomegaly  Ext: mild perimalleolar edema B/L Skin: warm and dry  Neuro:  moves all extremities spontaneously, no focal abnormalities noted  EKG: NSR 77bpm, septal infarct age undetermined, nonspecific T wave changes in I, avL, V6. No acute change from prior.  Recent Labs: 12/02/2015: B Natriuretic Peptide 289.6; BUN 18; Creatinine, Ser 0.87; Hemoglobin  12.0; Platelets 225; Potassium 3.9; Sodium 131  No results found for requested labs within last 8760 hours.   CrCl cannot be calculated (Patient's most recent lab result is older than the maximum 21 days allowed.).   Wt Readings from Last 3 Encounters:  03/26/16 113 lb (51.3 kg)  12/02/15 116 lb (52.6 kg)  11/22/15 115 lb (52.2 kg)    Other studies reviewed: Additional studies/records reviewed today include: hospital records above.   ASSESSMENT AND PLAN:  1. Paroxysmal atrial fibrillation - maintaining NSR. Continue metoprolol and diltiazem. Not on anticoagulation,  CHADSVASC score is 7 thus she is at high risk for stroke. She didn't want the anticoagulation, I agree with her considering her age and high risk of fall. We'll continue  aspirin only. She remains in sinus rhythm today.  2. Chronic diastolic CHF - she appears euvolemic.  3. Essential HTN - controlled.  4. Pulm HTN - PASP mildly elevated by recent echo. Follow clinically.  5. Lost of taste and smell, I have reviewed her medicines with the pharmacist and there is no medication that could cause that it's probably related to age.  Follow up in 6 months.   Signed, Ena Dawley, MD  03/26/2016 1:43 PM     [facility] [place] [address] [phone] (office)  [phone] (fax)

## 2016-04-29 ENCOUNTER — Encounter: Payer: Self-pay | Admitting: Podiatry

## 2016-04-29 ENCOUNTER — Ambulatory Visit (INDEPENDENT_AMBULATORY_CARE_PROVIDER_SITE_OTHER): Payer: Medicare Other | Admitting: Podiatry

## 2016-04-29 DIAGNOSIS — L97521 Non-pressure chronic ulcer of other part of left foot limited to breakdown of skin: Secondary | ICD-10-CM | POA: Diagnosis not present

## 2016-04-30 ENCOUNTER — Telehealth: Payer: Self-pay | Admitting: *Deleted

## 2016-04-30 ENCOUNTER — Other Ambulatory Visit: Payer: Self-pay | Admitting: Cardiology

## 2016-04-30 DIAGNOSIS — R0989 Other specified symptoms and signs involving the circulatory and respiratory systems: Secondary | ICD-10-CM

## 2016-04-30 MED ORDER — DILTIAZEM HCL ER COATED BEADS 120 MG PO CP24: 120.0000 mg | ORAL_CAPSULE | Freq: Every day | ORAL | 3 refills | Status: DC

## 2016-04-30 MED ORDER — SILVER SULFADIAZINE 1 % EX CREA: 1.0000 "application " | TOPICAL_CREAM | Freq: Every day | CUTANEOUS | 0 refills | Status: DC

## 2016-04-30 NOTE — Telephone Encounter (Signed)
Dr. Jacqualyn Posey ordered silvadene dressing changes to left foot 2nd toe 3 times weekly. Informed pt of the new orders for her left 2nd toe

## 2016-04-30 NOTE — Progress Notes (Signed)
Subjective: 82 y.o. returns the office today for follow-up evaluation of the wound to left third toe. He said her home health nurse came out yesterday and they said it is looking better. She denies any increase in swelling or redness or any drainage or pus coming from the wound. She's been using Silvadene to the wound. She states that when she is sitting on the couch she keeps her feet elevated and she goes without a bandage to help the area dry. Denies any acute changes since last appointment and no new complaints today. Denies any systemic complaints such as fevers, chills, nausea, vomiting.   Objective: AAO 3, NAD DP/PT pulses palpable, CRT less than 3 seconds Hyperkeratotic lesion to the distal aspect of the second third toes on the left foot. Upon debridement the second toe there was superficial granular wound measuring 0.4 x 0.3 cm there is no probing, undermining or tunneling.Wound appears to be about the same. There is no surrounding erythema, ascending cellulitis, fluctuance, crepitus, malodor. No drainage or pus. No other open lesions are identified this time. There are significant hammertoe and HAV present with left side worse than the right.  No open lesions or pre-ulcerative lesions are identified. No other areas of tenderness bilateral lower extremities. No overlying edema, erythema, increased warmth. No pain with calf compression, swelling, warmth, erythema.  Assessment: Patient presents with ulceration left toe Plan: -Treatment options including alternatives, risks, complications were discussed -Hyperkeratotic lesion, ulceration was sharply debrided to without complications to granular wound base. Continue with home healthcare daily dressing changes with silvadene three times a week.  -Will try to switch to [employer] as her request, however if not able will continue with current company -Will order arterial studies due to delayed healing of ulcer.  -Hyperkeratotic lesions right  foot debrided without complications or bleeding 2. Offloading pads dispensed. -Monitor for signs or symptoms of infection to the arch any further.  Celesta Gentile, DPM

## 2016-05-01 NOTE — Telephone Encounter (Addendum)
-----   Message from Trula Slade, DPM sent at 04/30/2016  3:58 PM EDT ----- Please order arterial studies for decreased pulse and wound that is delayed in healing to her toe on the left foot. Thank you. 05/01/2016-Faxed orders for arterial dopplers to [facility].

## 2016-05-09 ENCOUNTER — Other Ambulatory Visit: Payer: Self-pay | Admitting: Podiatry

## 2016-05-09 DIAGNOSIS — L97521 Non-pressure chronic ulcer of other part of left foot limited to breakdown of skin: Secondary | ICD-10-CM

## 2016-05-20 ENCOUNTER — Encounter: Payer: Self-pay | Admitting: Podiatry

## 2016-05-20 ENCOUNTER — Ambulatory Visit (INDEPENDENT_AMBULATORY_CARE_PROVIDER_SITE_OTHER): Payer: Medicare Other | Admitting: Podiatry

## 2016-05-20 VITALS — BP 157/85 | HR 93 | Temp 97.5°F | Resp 18

## 2016-05-20 DIAGNOSIS — L84 Corns and callosities: Secondary | ICD-10-CM

## 2016-05-20 DIAGNOSIS — L97521 Non-pressure chronic ulcer of other part of left foot limited to breakdown of skin: Secondary | ICD-10-CM

## 2016-05-20 DIAGNOSIS — L02612 Cutaneous abscess of left foot: Secondary | ICD-10-CM

## 2016-05-20 DIAGNOSIS — L03032 Cellulitis of left toe: Secondary | ICD-10-CM

## 2016-05-20 MED ORDER — CEPHALEXIN 500 MG PO CAPS: 500.0000 mg | ORAL_CAPSULE | Freq: Two times a day (BID) | ORAL | 2 refills | Status: DC

## 2016-05-22 ENCOUNTER — Ambulatory Visit (HOSPITAL_COMMUNITY)
Admission: RE | Admit: 2016-05-22 | Discharge: 2016-05-22 | Disposition: A | Discharge status: Home / Self Care | Payer: Medicare Other | Source: Ambulatory Visit | Attending: Cardiology | Admitting: Cardiology

## 2016-05-22 DIAGNOSIS — R0989 Other specified symptoms and signs involving the circulatory and respiratory systems: Secondary | ICD-10-CM | POA: Insufficient documentation

## 2016-05-22 DIAGNOSIS — L97521 Non-pressure chronic ulcer of other part of left foot limited to breakdown of skin: Secondary | ICD-10-CM | POA: Diagnosis not present

## 2016-05-22 DIAGNOSIS — I739 Peripheral vascular disease, unspecified: Secondary | ICD-10-CM

## 2016-05-22 NOTE — Progress Notes (Signed)
Subjective: 82 y.o. returns the office today for follow-up evaluation of the wound to left third toe. She states this toe is doing better however the big toe is now red. She states that when the home health nurse wraps the foot the bandage around the big toe. She has noticed a blister on the big toe and her toe becoming red. Denies any drainage or pus and denies any red streaks. Denies any acute changes since last appointment and no new complaints today. Denies any systemic complaints such as fevers, chills, nausea, vomiting.   Objective: AAO 3, NAD DP/PT pulses palpable, CRT less than 3 seconds Hyperkeratotic lesion to the distal aspect of the second third toes on the left foot. Upon debridement there is no underlying ulceration, drainage. There is faint local edema and erythema to the distal last of the left third toe which is chronic however there is no open sores identified there is no increase in warmth or ascending cellulitis. The left hallux does appear to be cellulitic from the tip to the MPJ. On the plantar aspect is a what appears to be a blister which is been partially D removed. The hallux is overlying the second and third toes. There is no fluctuance or crepitus or is no malodor. There is no ascending cellulitis. No open lesions or pre-ulcerative lesions are identified. No other areas of tenderness bilateral lower extremities. No overlying edema, erythema, increased warmth. No pain with calf compression, swelling, warmth, erythema.  Assessment: Ulceration/cellulitis resolved left third toe however new ulceration and cellulitis of the hallux.   Plan: -Treatment options including alternatives, risks, complications were discussed -Hyperkeratotic lesions/pre-ulcer lesions the left second third toes debrided without complications. Ulceration. -Antibiotic ointment and a bandage to the hallux daily. Offloading pads were dispensed. We will start Keflex due to infection. -Patient wishes to  have home health discontinued. She states the bandages can be changed at home by her and the family. She feels that the home health has not been helping and shows up intermittently. -Monitor for signs or symptoms of worsening infection to the ER should any occur. -Follow-up as scheduled or sooner if needed. Call any questions or concerns meantime.  Celesta Gentile, DPM

## 2016-05-22 NOTE — Telephone Encounter (Addendum)
-----   Message from Trula Slade, DPM sent at 05/22/2016  7:50 AM EST ----- Can you discontinue her home health nurse at her request? Thanks. 05/22/2016-Left message informing [facility] that pt wanted to discontinue her home health care. 07/24/2015-Pt states her left leg is swollen more than right, the left 1st toe is looking better, and the left leg goes down when elevated and is not red. Pt states this started yesterday. Dr. Jacqualyn Posey states continue the antibiotic and elevate the foot and leg call our office or go to ER if worsens. Pt states understanding and states she has a girl that comes in and helps her and the toe looks better. 06/02/2016-Pt states she had over 10 days of the antibiotic, does she need to complete. I told pt to complete the antibiotic. Pt states she has an appt tomorrow with Dr Jacqualyn Posey and will discuss the swelling with him.

## 2016-06-03 ENCOUNTER — Ambulatory Visit (HOSPITAL_BASED_OUTPATIENT_CLINIC_OR_DEPARTMENT_OTHER)
Admission: RE | Admit: 2016-06-03 | Discharge: 2016-06-03 | Disposition: A | Discharge status: Home / Self Care | Payer: Medicare Other | Source: Ambulatory Visit | Attending: Podiatry | Admitting: Podiatry

## 2016-06-03 ENCOUNTER — Inpatient Hospital Stay (HOSPITAL_BASED_OUTPATIENT_CLINIC_OR_DEPARTMENT_OTHER): Admission: RE | Admit: 2016-06-03 | Payer: Medicare Other | Source: Ambulatory Visit

## 2016-06-03 ENCOUNTER — Encounter: Payer: Self-pay | Admitting: Podiatry

## 2016-06-03 ENCOUNTER — Ambulatory Visit (INDEPENDENT_AMBULATORY_CARE_PROVIDER_SITE_OTHER): Payer: Medicare Other | Admitting: Podiatry

## 2016-06-03 DIAGNOSIS — L02612 Cutaneous abscess of left foot: Secondary | ICD-10-CM

## 2016-06-03 DIAGNOSIS — L03032 Cellulitis of left toe: Secondary | ICD-10-CM

## 2016-06-03 DIAGNOSIS — L97521 Non-pressure chronic ulcer of other part of left foot limited to breakdown of skin: Secondary | ICD-10-CM | POA: Diagnosis present

## 2016-06-03 DIAGNOSIS — O2230 Deep phlebothrombosis in pregnancy, unspecified trimester: Secondary | ICD-10-CM

## 2016-06-03 DIAGNOSIS — I82409 Acute embolism and thrombosis of unspecified deep veins of unspecified lower extremity: Secondary | ICD-10-CM

## 2016-06-03 MED ORDER — AMOXICILLIN-POT CLAVULANATE 875-125 MG PO TABS: 1.0000 | ORAL_TABLET | Freq: Two times a day (BID) | ORAL | 0 refills | Status: DC

## 2016-06-03 NOTE — Addendum Note (Signed)
Addended by: Cranford Mon R on: 06/03/2016 02:43 PM   Modules accepted: Orders

## 2016-06-03 NOTE — Progress Notes (Signed)
Subjective: 82 y.o. returns the office today for follow-up evaluation of the wound and cellulitis to the left hallux. She has been continuing antibiotics but she is getting a rash on her stomach. She denies any other side effects. She states the swelling discontinue and she gets a knot in her leg. She states that the redness has improved. She's been continuing Silvadene dressing changes daily. Denies any acute changes since last appointment and no new complaints today. Denies any systemic complaints such as fevers, chills, nausea, vomiting.   Objective: AAO 3, NAD DP/PT pulses palpable, CRT less than 3 seconds To superficial granular wounds present on the plantar aspect of the hallux measuring/1 x 1 cm each. There is no probing, undermining or tunneling. This is over an area where the hallux is significantly abducted overriding the second and third toe causing pressure. There is decreased edema and erythema to the hallux there is no ascending synovitis. There is edema to the foot and leg with any increase in warmth. There is no pair calf compression is no palpable knot in her leg.  Significant hammertoe contractures are present. His distal aspect of the fourth toe is a hyperkeratotic lesion. No ongoing ulceration, drainage or signs of infection. No other areas of tenderness bilateral lower extremities. No overlying edema, erythema, increased warmth. No pain with calf compression, swelling, warmth, erythema.  Assessment: Ulceration/cellulitis left hallux, cellulitis which has improved.    Plan: -Treatment options including alternatives, risks, complications were discussed -Although the cellulitis has improved it still remains. Will switch to Augmentin. Continue daily dressing changes with Silvadene. -Continue strict offloading at all times between her hallux and second toe. -I discussed vascular studies the patient. Although they are normal she did healed wound over third toe. The next appointment  if there is not improvement of the wound will refer to Dr. Alvester Chou however we will hold off on this for now. -Will obtain a venous duplex to rule out DVT. This is scheduled for 4:30 today -Monitor setting symptoms of forcing infection good the ER should any occur. -Follow up in 2 weeks or sooner if needed.  Celesta Gentile, DPM

## 2016-06-06 ENCOUNTER — Telehealth: Payer: Self-pay | Admitting: *Deleted

## 2016-06-06 NOTE — Telephone Encounter (Deleted)
-----   Message from Trula Slade, DPM sent at 06/06/2016  7:16 AM EST ----- Negative for DVT but there is a Bakers cyst in the knee. If she is having pain behind her knee please refer to ortho. Thanks. Please let her know the results.

## 2016-06-09 ENCOUNTER — Other Ambulatory Visit: Payer: Self-pay | Admitting: Cardiology

## 2016-06-10 NOTE — Telephone Encounter (Addendum)
-----   Message from Trula Slade, DPM sent at 06/06/2016  7:16 AM EST ----- Negative for DVT but there is a Bakers cyst in the knee. If she is having pain behind her knee please refer to ortho. Thanks. Please let her know the results. 06/10/2016-Informed pt of Dr. Leigh Aurora review of results. Pt states the knee does not hurt, the swelling is going down and the ankle is still a little swollen. Pt asked if she need to remain lying down and elevating her legs, I told her if when she sat if she could even put her feet up that would help. Pt states she isn't having diarrhea but it is softer than normal, taking the antibiotic at breakfast and supper, has an appt with Dr. Felipa Eth tomorrow and doesn't want to have a problem. Pt has an appt with Dr. Jacqualyn Posey 06/16/2016 at 2:45pm in [place], and she told me her dtr would be bringing her.

## 2016-06-16 ENCOUNTER — Ambulatory Visit (INDEPENDENT_AMBULATORY_CARE_PROVIDER_SITE_OTHER): Payer: Medicare Other | Admitting: Podiatry

## 2016-06-16 ENCOUNTER — Encounter: Payer: Self-pay | Admitting: Podiatry

## 2016-06-16 DIAGNOSIS — L84 Corns and callosities: Secondary | ICD-10-CM | POA: Diagnosis not present

## 2016-06-16 DIAGNOSIS — B351 Tinea unguium: Secondary | ICD-10-CM | POA: Diagnosis not present

## 2016-06-16 DIAGNOSIS — M79675 Pain in left toe(s): Secondary | ICD-10-CM | POA: Diagnosis not present

## 2016-06-16 DIAGNOSIS — L97521 Non-pressure chronic ulcer of other part of left foot limited to breakdown of skin: Secondary | ICD-10-CM | POA: Diagnosis not present

## 2016-06-16 DIAGNOSIS — L02612 Cutaneous abscess of left foot: Secondary | ICD-10-CM | POA: Diagnosis not present

## 2016-06-16 DIAGNOSIS — M79674 Pain in right toe(s): Secondary | ICD-10-CM

## 2016-06-16 DIAGNOSIS — L03032 Cellulitis of left toe: Secondary | ICD-10-CM | POA: Diagnosis not present

## 2016-06-18 ENCOUNTER — Other Ambulatory Visit: Payer: Self-pay | Admitting: Physician Assistant

## 2016-06-23 NOTE — Progress Notes (Signed)
Subjective: 82 y.o. returns the office today for follow-up evaluation of the wound and cellulitis to the left hallux. She is completed a course of antibiotics. She stated the feet look much better. She denies any open sores and she denies any drainage coming from any wounds. The redness is much improved. She's having no pain. Denies any acute changes since last appointment and no new complaints today. Denies any systemic complaints such as fevers, chills, nausea, vomiting.   Objective: AAO 3, NAD DP/PT pulses palpable, CRT less than 3 seconds Superficial wound present on the plantar aspect of the hallux appears to be healed today. Very small scab is present upon debridement the underlying ulceration appears to be healed. Erythema appears to be almost completely resolved at this point as well. There is no ascending cellulitis. There is trace edema to the hallux but there is no increase in warmth. Infection appears very much improves. No fluctuance or crepitus or malodor. Pre-ulcerative lesion to the distal aspect of the third toe after debridement there is no underlying ulceration, drainage or any signs of infection. Nails are hypertrophic, dystrophic, brittle, discolored, elongated 10. Tenderness to nails 1-5 bilaterally. No swelling redness or drainage from the nail sites. No other areas of tenderness bilateral lower extremities. No overlying edema, erythema, increased warmth. No pain with calf compression, swelling, warmth, erythema.  Assessment: Ulceration/cellulitis left hallux which has improved; pre-ulcerative callus to the 3rd toe; symptomatic onychomycosis   Plan: -Treatment options including alternatives, risks, complications were discussed -Pre-ulcerative callus the third toes debrided without complications or bleeding. -Infection to the left hallux appears to be resolved. -Discussed daily foot inspection to monitor for any further skin breakdown any recurrence the infection to call the  office and medially should any occur. -He is duplex results were discussed the patient. It did reveal a Baker's cyst for this is a symptomatic this time. Discussed that if she has any problems orthopedic follow-up. -Nails debrided 10 without complications or bleeding -Follow-up as scheduled or sooner if needed.  Celesta Gentile, DPM

## 2016-06-24 ENCOUNTER — Ambulatory Visit: Payer: Medicare Other | Admitting: Podiatry

## 2016-07-31 ENCOUNTER — Other Ambulatory Visit: Payer: Self-pay | Admitting: Physician Assistant

## 2016-08-18 ENCOUNTER — Ambulatory Visit: Payer: Medicare Other | Admitting: Podiatry

## 2016-09-09 ENCOUNTER — Ambulatory Visit (INDEPENDENT_AMBULATORY_CARE_PROVIDER_SITE_OTHER): Payer: Medicare Other | Admitting: Podiatry

## 2016-09-09 ENCOUNTER — Encounter: Payer: Self-pay | Admitting: Podiatry

## 2016-09-09 DIAGNOSIS — L84 Corns and callosities: Secondary | ICD-10-CM

## 2016-09-09 DIAGNOSIS — M79675 Pain in left toe(s): Secondary | ICD-10-CM

## 2016-09-09 DIAGNOSIS — B351 Tinea unguium: Secondary | ICD-10-CM

## 2016-09-09 DIAGNOSIS — M79674 Pain in right toe(s): Secondary | ICD-10-CM

## 2016-09-15 NOTE — Progress Notes (Signed)
Subjective: 83 y.o. returns the office today for painful, elongated, thickened toenails which she cannot trim herself. Denies any redness or drainage around the nails. She states that she has not noticed any open sores but there is pain to the 4th toe and gets red at times. No recent injury. Denies any acute changes since last appointment and no new complaints today. Denies any systemic complaints such as fevers, chills, nausea, vomiting.   Objective: AAO 3, NAD DP/PT pulses palpable, CRT less than 3 seconds Multiple digital deformities are present. The hallux is in a severely abducted position. There is hammertoe contractures present.   Nails hypertrophic, dystrophic, elongated, brittle, discolored 10. There is tenderness overlying the nails 1-5 bilaterally. There is no surrounding erythema or drainage along the nail sites. Hyperkeratotic lesion present left fourth toe distally. There is tenderness to this area. Upon debridement no underlying ulceration, drainage or any signs of infection.  No open lesions or pre-ulcerative lesions are identified. No other areas of tenderness bilateral lower extremities. No overlying edema, erythema, increased warmth. No pain with calf compression, swelling, warmth, erythema.  Assessment: Patient presents with symptomatic onychomycosis; pre-ulcerative callus  Plan: -Treatment options including alternatives, risks, complications were discussed -Nails sharply debrided 10 without complication/bleeding. -Hyperkeratotic lesion sharply debrided x 1 without complications or bleeding.  -Discussed daily foot inspection. If there are any changes, to call the office immediately.  -Follow-up in 3 months or sooner if any problems are to arise. In the meantime, encouraged to call the office with any questions, concerns, changes symptoms.  Celesta Gentile, DPM

## 2016-09-25 ENCOUNTER — Telehealth: Payer: Self-pay

## 2016-09-25 ENCOUNTER — Telehealth: Payer: Self-pay | Admitting: Podiatry

## 2016-09-25 NOTE — Telephone Encounter (Signed)
Pt called wanted to speak with Lattie Haw or another nurse. She said her leg is still swelling.

## 2016-09-25 NOTE — Telephone Encounter (Signed)
Spoke with pt regarding concern about lower leg swelling, she states that there is some warmth to the area and the swelling is not going away like it usually does. Advised pt to come in before regular scheduled appt, sent a message to schedulers.

## 2016-09-27 NOTE — Telephone Encounter (Signed)
Her venous duplex was negative when this started. Please make sure she does not have redness, warmth, or any open sores. Is she wearing compression socks?

## 2016-09-29 NOTE — Telephone Encounter (Signed)
I spoke with pt and she states no one called her last week, her dtr had to call and get her an appt with Dr. Jacqualyn Posey tomorrow. I explained I had been out of the office and apologized for the nurse not calling. Pt states the leg had been more swollen and warm last week when she took her socks off, but better now. I told her to continue elevation and rest and asked if she was wearing the compression hose and she states not at this time.

## 2016-09-30 ENCOUNTER — Ambulatory Visit (HOSPITAL_BASED_OUTPATIENT_CLINIC_OR_DEPARTMENT_OTHER)
Admission: RE | Admit: 2016-09-30 | Discharge: 2016-09-30 | Disposition: A | Discharge status: Home / Self Care | Payer: Medicare Other | Source: Ambulatory Visit | Attending: Podiatry | Admitting: Podiatry

## 2016-09-30 ENCOUNTER — Ambulatory Visit (INDEPENDENT_AMBULATORY_CARE_PROVIDER_SITE_OTHER): Payer: Medicare Other | Admitting: Podiatry

## 2016-09-30 DIAGNOSIS — M2012 Hallux valgus (acquired), left foot: Secondary | ICD-10-CM | POA: Insufficient documentation

## 2016-09-30 DIAGNOSIS — L97522 Non-pressure chronic ulcer of other part of left foot with fat layer exposed: Secondary | ICD-10-CM

## 2016-09-30 DIAGNOSIS — M2041 Other hammer toe(s) (acquired), right foot: Secondary | ICD-10-CM | POA: Diagnosis not present

## 2016-09-30 DIAGNOSIS — X58XXXA Exposure to other specified factors, initial encounter: Secondary | ICD-10-CM | POA: Diagnosis not present

## 2016-09-30 DIAGNOSIS — M21612 Bunion of left foot: Secondary | ICD-10-CM | POA: Diagnosis not present

## 2016-09-30 DIAGNOSIS — M19072 Primary osteoarthritis, left ankle and foot: Secondary | ICD-10-CM | POA: Diagnosis not present

## 2016-09-30 DIAGNOSIS — S93102A Unspecified subluxation of left toe(s), initial encounter: Secondary | ICD-10-CM | POA: Diagnosis not present

## 2016-09-30 DIAGNOSIS — M25572 Pain in left ankle and joints of left foot: Secondary | ICD-10-CM | POA: Diagnosis present

## 2016-09-30 DIAGNOSIS — M2042 Other hammer toe(s) (acquired), left foot: Secondary | ICD-10-CM | POA: Diagnosis not present

## 2016-09-30 DIAGNOSIS — R52 Pain, unspecified: Secondary | ICD-10-CM

## 2016-10-01 NOTE — Progress Notes (Signed)
Subjective: 83 year old female presents the office with concerns of her left leg becoming swollen and painful over the weekend but since then she states her symptoms are much improved. She has noticed some mild swelling to the toes but denies any drainage or pus or any redness. She denies any recent injury or trauma. She said no recent treatment for this. She has been keeping her foot elevated and this has been helping quite a bit. Denies any systemic complaints such as fevers, chills, nausea, vomiting. No acute changes since last appointment, and no other complaints at this time.   Objective: AAO x3, NAD DP/PT pulses palpable bilaterally, CRT less than 3 seconds Significant degenerative deformity present. The hallux is overlapping the second third digit there is hammertoe contractures present. The distal aspect of the left fourth toe is a hyperkeratotic lesion. Upon debridement there is a superficial area of purulence identified under the callus however after debridement there is an ulceration which measures almost 0.1 x 0.1 x 0.3 cm. The area is active pinpoint size but does probe however not to bone. There is no undermining or tunneling. There is faint in edema to the toe but there is no significant erythema or ascending cellulitis. After debridement there was no further purulence. There is no increase in warmth to the foot. There is no swelling to the calf today.  No open lesions or pre-ulcerative lesions.  No pain with calf compression, swelling, warmth, erythema  Assessment: 83 year old female left fourth toe ulceration with superficial infection   Plan: -All treatment options discussed with the patient including all alternatives, risks, complications.  -X-rays were obtained and reviewed with the patient.  -Wound to the left fourth toe sharply debrided without complications. Recommended to continue interbody ointment dressing changes to this area daily and offloading. We'll hold off on oral  antibiotics is no further signs of infection we will continue with topical antibiotic ointment. Offloading pads were dispensed today. Daily dressing changes. Monitor for any clinical signs or symptoms of infection and directed to call the office immediately should any occur or go to the ER. -RTC in 3 weeks or sooner if needed.  -Patient encouraged to call the office with any questions, concerns, change in symptoms.   Celesta Gentile, DPM

## 2016-10-21 ENCOUNTER — Ambulatory Visit (INDEPENDENT_AMBULATORY_CARE_PROVIDER_SITE_OTHER): Payer: Medicare Other | Admitting: Podiatry

## 2016-10-21 DIAGNOSIS — L97521 Non-pressure chronic ulcer of other part of left foot limited to breakdown of skin: Secondary | ICD-10-CM | POA: Diagnosis not present

## 2016-10-21 DIAGNOSIS — B351 Tinea unguium: Secondary | ICD-10-CM | POA: Diagnosis not present

## 2016-10-24 NOTE — Progress Notes (Signed)
Subjective: 83 year old female presents the office for follow-up evaluation of left fourth toe wound. She's feels that this is doing very well and almost healed. She denies any swelling redness or any drainage. She denies any new sores present. She has no other concerns. Denies any systemic complaints such as fevers, chills, nausea, vomiting. No acute changes since last appointment, and no other complaints at this time.   Objective: AAO x3, NAD DP/PT pulses palpable bilaterally, CRT less than 3 seconds Significant degenerative deformity present. The hallux is overlapping the second third digit there is hammertoe contractures present. The distal aspect of the left fourth toe is a hyperkeratotic lesion. Upon debridement the wound is almost completely healed. There is a very small superficial pinpoint area of an open sore. There is no edema, erythema, drainage or pus. There is no clinical signs of infection present. Nails appear to be dystrophic, discolored with yellow brown discoloration hypertrophic and mildly elongated. This any redness or drainage from the toenail sites. No other open lesions or pre-ulcerative lesions.  No pain with calf compression, swelling, warmth, erythema  Assessment: 83 year old female left fourth toe ulceration with superficial infection   Plan: -All treatment options discussed with the patient including all alternatives, risks, complications.  -Wound sharply debrided today. The wound is almost completely healed at this point is doing very well. Continue with daily dressing changes with antibiotic ointment daily. The areas nonhealing extremity resolve the office. -Nails were lightly debrided without complications or bleeding. -Follow-up 9 weeks or sooner if needed. Call any questions or concerns any time.  Celesta Gentile, DPM

## 2016-11-09 ENCOUNTER — Inpatient Hospital Stay (HOSPITAL_COMMUNITY)
Admission: EM | Admit: 2016-11-09 | Discharge: 2016-11-11 | DRG: 291 | Disposition: A | Discharge status: Home Health | Payer: Medicare Other | Attending: Internal Medicine | Admitting: Internal Medicine

## 2016-11-09 ENCOUNTER — Encounter (HOSPITAL_COMMUNITY): Payer: Self-pay | Admitting: Emergency Medicine

## 2016-11-09 ENCOUNTER — Emergency Department (HOSPITAL_COMMUNITY): Payer: Medicare Other

## 2016-11-09 DIAGNOSIS — I480 Paroxysmal atrial fibrillation: Secondary | ICD-10-CM | POA: Diagnosis present

## 2016-11-09 DIAGNOSIS — G8110 Spastic hemiplegia affecting unspecified side: Secondary | ICD-10-CM | POA: Diagnosis present

## 2016-11-09 DIAGNOSIS — K219 Gastro-esophageal reflux disease without esophagitis: Secondary | ICD-10-CM | POA: Diagnosis present

## 2016-11-09 DIAGNOSIS — Z91018 Allergy to other foods: Secondary | ICD-10-CM | POA: Diagnosis not present

## 2016-11-09 DIAGNOSIS — J9600 Acute respiratory failure, unspecified whether with hypoxia or hypercapnia: Secondary | ICD-10-CM | POA: Diagnosis present

## 2016-11-09 DIAGNOSIS — R1011 Right upper quadrant pain: Secondary | ICD-10-CM | POA: Diagnosis present

## 2016-11-09 DIAGNOSIS — Z66 Do not resuscitate: Secondary | ICD-10-CM | POA: Diagnosis not present

## 2016-11-09 DIAGNOSIS — R0609 Other forms of dyspnea: Secondary | ICD-10-CM | POA: Diagnosis present

## 2016-11-09 DIAGNOSIS — I272 Pulmonary hypertension, unspecified: Secondary | ICD-10-CM | POA: Diagnosis present

## 2016-11-09 DIAGNOSIS — I509 Heart failure, unspecified: Secondary | ICD-10-CM

## 2016-11-09 DIAGNOSIS — Z79899 Other long term (current) drug therapy: Secondary | ICD-10-CM

## 2016-11-09 DIAGNOSIS — Z7952 Long term (current) use of systemic steroids: Secondary | ICD-10-CM | POA: Diagnosis not present

## 2016-11-09 DIAGNOSIS — R29898 Other symptoms and signs involving the musculoskeletal system: Secondary | ICD-10-CM | POA: Diagnosis present

## 2016-11-09 DIAGNOSIS — Z7982 Long term (current) use of aspirin: Secondary | ICD-10-CM | POA: Diagnosis not present

## 2016-11-09 DIAGNOSIS — R0600 Dyspnea, unspecified: Secondary | ICD-10-CM | POA: Diagnosis present

## 2016-11-09 DIAGNOSIS — I5033 Acute on chronic diastolic (congestive) heart failure: Secondary | ICD-10-CM | POA: Diagnosis present

## 2016-11-09 DIAGNOSIS — R0602 Shortness of breath: Secondary | ICD-10-CM | POA: Diagnosis present

## 2016-11-09 DIAGNOSIS — M353 Polymyalgia rheumatica: Secondary | ICD-10-CM | POA: Diagnosis present

## 2016-11-09 DIAGNOSIS — E871 Hypo-osmolality and hyponatremia: Secondary | ICD-10-CM | POA: Diagnosis present

## 2016-11-09 DIAGNOSIS — G8111 Spastic hemiplegia affecting right dominant side: Secondary | ICD-10-CM | POA: Diagnosis present

## 2016-11-09 DIAGNOSIS — I639 Cerebral infarction, unspecified: Secondary | ICD-10-CM | POA: Diagnosis present

## 2016-11-09 DIAGNOSIS — S81802A Unspecified open wound, left lower leg, initial encounter: Secondary | ICD-10-CM | POA: Diagnosis present

## 2016-11-09 DIAGNOSIS — Z8673 Personal history of transient ischemic attack (TIA), and cerebral infarction without residual deficits: Secondary | ICD-10-CM | POA: Diagnosis not present

## 2016-11-09 DIAGNOSIS — Z85038 Personal history of other malignant neoplasm of large intestine: Secondary | ICD-10-CM

## 2016-11-09 DIAGNOSIS — Z8249 Family history of ischemic heart disease and other diseases of the circulatory system: Secondary | ICD-10-CM

## 2016-11-09 DIAGNOSIS — I110 Hypertensive heart disease with heart failure: Secondary | ICD-10-CM | POA: Diagnosis present

## 2016-11-09 DIAGNOSIS — Z888 Allergy status to other drugs, medicaments and biological substances status: Secondary | ICD-10-CM

## 2016-11-09 DIAGNOSIS — I4891 Unspecified atrial fibrillation: Secondary | ICD-10-CM | POA: Diagnosis not present

## 2016-11-09 DIAGNOSIS — I10 Essential (primary) hypertension: Secondary | ICD-10-CM | POA: Diagnosis present

## 2016-11-09 DIAGNOSIS — I5032 Chronic diastolic (congestive) heart failure: Secondary | ICD-10-CM | POA: Diagnosis present

## 2016-11-09 LAB — CBC
HCT: 43.2 % (ref 36.0–46.0)
HEMOGLOBIN: 13.9 g/dL (ref 12.0–15.0)
MCH: 31 pg (ref 26.0–34.0)
MCHC: 32.2 g/dL (ref 30.0–36.0)
MCV: 96.2 fL (ref 78.0–100.0)
Platelets: 251 10*3/uL (ref 150–400)
RBC: 4.49 MIL/uL (ref 3.87–5.11)
RDW: 14.1 % (ref 11.5–15.5)
WBC: 11.2 10*3/uL — ABNORMAL HIGH (ref 4.0–10.5)

## 2016-11-09 LAB — URINALYSIS, ROUTINE W REFLEX MICROSCOPIC
Bilirubin Urine: NEGATIVE
Glucose, UA: NEGATIVE mg/dL
HGB URINE DIPSTICK: NEGATIVE
Ketones, ur: NEGATIVE mg/dL
Leukocytes, UA: NEGATIVE
Nitrite: NEGATIVE
Protein, ur: NEGATIVE mg/dL
SPECIFIC GRAVITY, URINE: 1.005 (ref 1.005–1.030)
pH: 7 (ref 5.0–8.0)

## 2016-11-09 LAB — TROPONIN I: Troponin I: <0.03 ng/mL (ref <0.03)

## 2016-11-09 LAB — BASIC METABOLIC PANEL
Anion gap: 9 (ref 5–15)
BUN: 15 mg/dL (ref 6–20)
CALCIUM: 9 mg/dL (ref 8.9–10.3)
CHLORIDE: 101 mmol/L (ref 101–111)
CO2: 23 mmol/L (ref 22–32)
CREATININE: 0.67 mg/dL (ref 0.44–1.00)
GFR calc non Af Amer: >60 mL/min (ref >60)
GLUCOSE: 97 mg/dL (ref 65–99)
Potassium: 4.8 mmol/L (ref 3.5–5.1)
Sodium: 133 mmol/L — ABNORMAL LOW (ref 135–145)

## 2016-11-09 LAB — TSH: TSH: 1.171 u[IU]/mL (ref 0.350–4.500)

## 2016-11-09 LAB — I-STAT TROPONIN, ED: Troponin i, poc: 0.01 ng/mL (ref 0.00–0.08)

## 2016-11-09 LAB — BRAIN NATRIURETIC PEPTIDE: B Natriuretic Peptide: 502.7 pg/mL — ABNORMAL HIGH (ref 0.0–100.0)

## 2016-11-09 LAB — GLUCOSE, CAPILLARY: Glucose-Capillary: 108 mg/dL — ABNORMAL HIGH (ref 65–99)

## 2016-11-09 LAB — MAGNESIUM: MAGNESIUM: 2.2 mg/dL (ref 1.7–2.4)

## 2016-11-09 MED ORDER — SODIUM CHLORIDE 0.9% FLUSH
3.0000 mL | Freq: Two times a day (BID) | INTRAVENOUS | Status: DC
  Administered 2016-11-09 – 2016-11-11 (×4): 3 mL via INTRAVENOUS

## 2016-11-09 MED ORDER — METOPROLOL SUCCINATE ER 50 MG PO TB24
75.0000 mg | ORAL_TABLET | Freq: Two times a day (BID) | ORAL | Status: DC
  Administered 2016-11-09 – 2016-11-11 (×4): 75 mg via ORAL
  Filled 2016-11-09 (×5): qty 1

## 2016-11-09 MED ORDER — SENNOSIDES-DOCUSATE SODIUM 8.6-50 MG PO TABS
1.0000 | ORAL_TABLET | Freq: Every day | ORAL | Status: DC | PRN
  Filled 2016-11-09: qty 1

## 2016-11-09 MED ORDER — LEVOFLOXACIN IN D5W 500 MG/100ML IV SOLN
500.0000 mg | Freq: Once | INTRAVENOUS | Status: AC
  Administered 2016-11-09: 500 mg via INTRAVENOUS
  Filled 2016-11-09: qty 100

## 2016-11-09 MED ORDER — PANTOPRAZOLE SODIUM 40 MG PO TBEC
40.0000 mg | DELAYED_RELEASE_TABLET | Freq: Every day | ORAL | Status: DC
  Administered 2016-11-10 – 2016-11-11 (×2): 40 mg via ORAL
  Filled 2016-11-09 (×2): qty 1

## 2016-11-09 MED ORDER — DILTIAZEM HCL ER COATED BEADS 120 MG PO CP24
120.0000 mg | ORAL_CAPSULE | Freq: Every day | ORAL | Status: DC
  Administered 2016-11-10 – 2016-11-11 (×2): 120 mg via ORAL
  Filled 2016-11-09 (×2): qty 1

## 2016-11-09 MED ORDER — ONDANSETRON HCL 4 MG/2ML IJ SOLN: 4.0000 mg | Freq: Four times a day (QID) | INTRAMUSCULAR | Status: DC | PRN

## 2016-11-09 MED ORDER — SODIUM CHLORIDE 0.9 % IV SOLN: 250.0000 mL | INTRAVENOUS | Status: DC | PRN

## 2016-11-09 MED ORDER — SODIUM CHLORIDE 0.9 % IV BOLUS (SEPSIS)
500.0000 mL | Freq: Once | INTRAVENOUS | Status: AC
  Administered 2016-11-09: 500 mL via INTRAVENOUS

## 2016-11-09 MED ORDER — DILTIAZEM HCL 25 MG/5ML IV SOLN
10.0000 mg | Freq: Once | INTRAVENOUS | Status: AC
  Administered 2016-11-09: 10 mg via INTRAVENOUS
  Filled 2016-11-09: qty 5

## 2016-11-09 MED ORDER — ALPRAZOLAM 0.25 MG PO TABS: 0.2500 mg | ORAL_TABLET | Freq: Two times a day (BID) | ORAL | Status: DC | PRN

## 2016-11-09 MED ORDER — OMEPRAZOLE MAGNESIUM 20 MG PO TBEC: 20.0000 mg | DELAYED_RELEASE_TABLET | Freq: Every day | ORAL | Status: DC

## 2016-11-09 MED ORDER — DOCUSATE SODIUM 100 MG PO CAPS
200.0000 mg | ORAL_CAPSULE | Freq: Every day | ORAL | Status: DC
  Administered 2016-11-10 – 2016-11-11 (×2): 200 mg via ORAL
  Filled 2016-11-09 (×2): qty 2

## 2016-11-09 MED ORDER — DILTIAZEM HCL 25 MG/5ML IV SOLN: 10.0000 mg | Freq: Once | INTRAVENOUS | Status: DC

## 2016-11-09 MED ORDER — ENOXAPARIN SODIUM 30 MG/0.3ML ~~LOC~~ SOLN
30.0000 mg | SUBCUTANEOUS | Status: DC
  Administered 2016-11-09 – 2016-11-10 (×2): 30 mg via SUBCUTANEOUS
  Filled 2016-11-09 (×3): qty 0.3

## 2016-11-09 MED ORDER — ASPIRIN 81 MG PO CHEW
81.0000 mg | CHEWABLE_TABLET | Freq: Every day | ORAL | Status: DC
  Administered 2016-11-10 – 2016-11-11 (×2): 81 mg via ORAL
  Filled 2016-11-09 (×2): qty 1

## 2016-11-09 NOTE — ED Notes (Signed)
Patient transported to X-ray 

## 2016-11-09 NOTE — H&P (Signed)
History and Physical    Krystal Hensley PRX:[id number] DOB: 02/16/1918 DOA: 11/09/2016   PCP: Lajean Manes, MD   Patient coming from:  Home    Chief Complaint: Shortness of breath   HPI: Krystal Hensley is a 83 y.o. female with medical history significant for HTN, Chronic DHF, PAF, Polymyalgia Rheumatica,  Pulmonary Hypertension, prior CVA, presenting with 4 day history of non-productive cough, increasing shortness of breath, especially with exertion, as well as generalized weakness. No fever or chills. Denies chest pain or palpitations. Denies any new lower extremity swelling or pain. Denies urinary frequency urgency or dysuria. Patient has been compliant her medications, and her last dose of beta blocker was this morning.  ED Course:  BP 122/86   Pulse 68   Temp 97.3 F (36.3 C) (Oral)   Resp (!) 21   Ht _0  (1.549 m)   Wt 48.1 kg (106 lb)   SpO2 97%   BMI 20.03 kg/m   sodium 133 potassium 4.8 CO2 in be met was 23 glucose 97 BUN 15 creatinine 0.67 magnesium 2.2 BNP 50 2.7 troponin 0.01 white count 11.2 hemoglobin 13.9 platelets 251 TSH 1.171 EKG showed atrial fibrillation with RVR in the 1st EKG, with no follow-up EKG. Per EDP, the patient had initially, Eustace Moore the 2 sinus rhythm, then again returning to atrial fibrillation patient received Cardizem 10 mg x2 while at the ER, then placed on Cardizem drip at 5 mg   Review of Systems: As per HPI otherwise 10 point review of systems negative.   Past Medical History:  Diagnosis Date  . Arthritis   . Back pain   . Chronic diastolic CHF (congestive heart failure) ([place])   . Colon cancer ([place])   . HTN (hypertension) 08/08/2013  . Hypertension   . Hypoglycemia   . PAF (paroxysmal atrial fibrillation) ([place])   . Polymyalgia rheumatica ([place])   . Pulmonary hypertension ([place])    a. Echo 07/2014 - mildly increased PASP.  Marland Kitchen Stroke [facility])     Past Surgical History:  Procedure Laterality Date  . BILATERAL OOPHORECTOMY    . CESAREAN  SECTION    . COLON SURGERY    . EYE SURGERY      Social History Social History   Social History  . Marital status: Widowed    Spouse name: N/A  . Number of children: N/A  . Years of education: N/A   Occupational History  . Not on file.   Social History Main Topics  . Smoking status: Never Smoker  . Smokeless tobacco: Never Used  . Alcohol use No  . Drug use: No  . Sexual activity: No   Other Topics Concern  . Not on file   Social History Narrative  . No narrative on file     Allergies  Allergen Reactions  . Chocolate Other (See Comments)    Unknown  . Statins Other (See Comments)    myalgia    Family History  Problem Relation Age of Onset  . Aneurysm Mother   . Heart attack Father       Prior to Admission medications   Medication Sig Start Date End Date Taking? Authorizing Provider  acetaminophen (TYLENOL) 500 MG tablet Take 1 tablet (500 mg total) by mouth every 6 (six) hours as needed. Patient taking differently: Take 500 mg by mouth every 6 (six) hours as needed for moderate pain.  06/15/14  Yes Erlene Quan, PA-C  aspirin 81 MG chewable tablet Chew 1  tablet (81 mg total) by mouth daily. 08/19/13  Yes Angiulli, Lavon Paganini, PA-C  diltiazem (CARDIZEM CD) 120 MG 24 hr capsule take 1 capsule by mouth once daily 08/09/15  Yes Dorothy Spark, MD  docusate sodium (COLACE) 100 MG capsule Take 200 mg by mouth daily.    Yes [provider]  metoprolol succinate (TOPROL-XL) 50 MG 24 hr tablet take 1 tablet by mouth once daily take with food or IMMEDIATELY FOLLOWING A MEAL 06/10/16  Yes Dorothy Spark, MD  Multiple Vitamins-Minerals (VISION FORMULA/LUTEIN PO) Take 1 tablet by mouth daily at 6 (six) AM.   Yes [provider]  omeprazole (PRILOSEC OTC) 20 MG tablet Take 20 mg by mouth daily.    Yes [provider]  sennosides-docusate sodium (SENOKOT-S) 8.6-50 MG tablet Take 1 tablet by mouth daily as needed for constipation.    Yes  [provider]  amoxicillin-clavulanate (AUGMENTIN) 875-125 MG tablet Take 1 tablet by mouth 2 (two) times daily. Patient not taking: Reported on 11/09/2016 06/03/16   Trula Slade, DPM  calcium-vitamin D (OSCAL WITH D) 500-200 MG-UNIT per tablet Take 1 tablet by mouth daily with breakfast.    [provider]  cephALEXin (KEFLEX) 500 MG capsule Take 1 capsule (500 mg total) by mouth 2 (two) times daily. Patient not taking: Reported on 11/09/2016 05/20/16   Trula Slade, DPM  diclofenac (FLECTOR) 1.3 % PTCH Place 0.5 patches onto the skin 2 (two) times daily as needed (for pain).     [provider]  diltiazem (CARDIZEM CD) 120 MG 24 hr capsule Take 1 capsule (120 mg total) by mouth daily. 04/30/16 07/29/16  Dorothy Spark, MD  furosemide (LASIX) 20 MG tablet Take 1 tablet (20 mg total) by mouth daily as needed for edema. Patient not taking: Reported on 11/09/2016 11/22/15   Dorothy Spark, MD  Magnesium Oxide 200 MG TABS Take 1 tab (200 mg total) by mouth daily x 5 days, then take 1 tablet daily as needed thereafter for muscle cramps. Patient not taking: Reported on 11/09/2016 03/26/16   Dorothy Spark, MD  predniSONE (DELTASONE) 5 MG tablet Take 5 mg by mouth every morning.     [provider]  silver sulfADIAZINE (SILVADENE) 1 % cream Apply 1 application topically daily. Patient not taking: Reported on 11/09/2016 04/30/16   Trula Slade, DPM    Physical Exam:  Vitals:   11/09/16 1300 11/09/16 1330 11/09/16 1400 11/09/16 1430  BP: 114/68 121/76 121/72 122/86  Pulse: 70 70 71 68  Resp: 17 (!) 28 (!) 22 (!) 21  Temp:      TempSrc:      SpO2: 93% 97% 94% 97%  Weight:      Height:       Constitutional: NAD, calm, anxious  Eyes: PERRL, lids and conjunctivae normal ENMT: Mucous membranes are moist, without exudate or lesions  Neck: normal, supple, no masses, no thyromegaly Respiratory: clear to auscultation bilaterally, no wheezing,  no crackles. Normal respiratory effort  Cardiovascular: IRR, no murmurs, rubs or gallops. No extremity edema. Venous stasis changes 2+ pedal pulses. No carotid bruits.  Abdomen: Soft, non tender, No hepatosplenomegaly. Bowel sounds positive.  Musculoskeletal:Mild tenderness to palpation over the right paracervical and trapezius muscles, no lymphadenopathy Skin: no jaundice, No lesions.  Neurologic: Sensation intact  Strength equal in all extremities Psychiatric:   Alert and oriented x 3. Anxious mood.     Labs on Admission: I have personally reviewed  following labs and imaging studies  CBC:  Recent Labs Lab 11/09/16 1200  WBC 11.2*  HGB 13.9  HCT 43.2  MCV 96.2  PLT 814    Basic Metabolic Panel:  Recent Labs Lab 11/09/16 1200  NA 133*  K 4.8  CL 101  CO2 23  GLUCOSE 97  BUN 15  CREATININE 0.67  CALCIUM 9.0  MG 2.2    GFR: Estimated Creatinine Clearance: 31 mL/min (by C-G formula based on SCr of 0.67 mg/dL).  Liver Function Tests: No results for input(s): AST, ALT, ALKPHOS, BILITOT, PROT, ALBUMIN in the last 168 hours. No results for input(s): LIPASE, AMYLASE in the last 168 hours. No results for input(s): AMMONIA in the last 168 hours.  Coagulation Profile: No results for input(s): INR, PROTIME in the last 168 hours.  Cardiac Enzymes: No results for input(s): CKTOTAL, CKMB, CKMBINDEX, TROPONINI in the last 168 hours.  BNP (last 3 results) No results for input(s): PROBNP in the last 8760 hours.  HbA1C: No results for input(s): HGBA1C in the last 72 hours.  CBG: No results for input(s): GLUCAP in the last 168 hours.  Lipid Profile: No results for input(s): CHOL, HDL, LDLCALC, TRIG, CHOLHDL, LDLDIRECT in the last 72 hours.  Thyroid Function Tests:  Recent Labs  11/09/16 1223  TSH 1.171    Anemia Panel: No results for input(s): VITAMINB12, FOLATE, FERRITIN, TIBC, IRON, RETICCTPCT in the last 72 hours.  Urine analysis:    Component Value  Date/Time   COLORURINE YELLOW 11/09/2016 1426   APPEARANCEUR CLEAR 11/09/2016 1426   LABSPEC 1.005 11/09/2016 1426   PHURINE 7.0 11/09/2016 1426   GLUCOSEU NEGATIVE 11/09/2016 1426   HGBUR NEGATIVE 11/09/2016 1426   BILIRUBINUR NEGATIVE 11/09/2016 1426   KETONESUR NEGATIVE 11/09/2016 1426   PROTEINUR NEGATIVE 11/09/2016 1426   UROBILINOGEN 0.2 12/07/2014 1325   NITRITE NEGATIVE 11/09/2016 1426   LEUKOCYTESUR NEGATIVE 11/09/2016 1426    Sepsis Labs: _0 (procalcitonin:4,lacticidven:4) )No results found for this or any previous visit (from the past 240 hour(s)).   Radiological Exams on Admission: Dg Chest 2 View  Result Date: 11/09/2016 CLINICAL DATA:  Shortness of breath and chest pain for 4 days. EXAM: CHEST  2 VIEW COMPARISON:  12/02/2015 and prior radiograph FINDINGS: Cardiomegaly and chronic interstitial pulmonary opacities again noted. There is no evidence of focal airspace disease, pulmonary edema, suspicious pulmonary nodule/mass, pleural effusion, or pneumothorax. No acute bony abnormalities are identified. A moderate to large hiatal hernia is again noted. IMPRESSION: No evidence of acute cardiopulmonary disease. Cardiomegaly, chronic interstitial opacities and moderate to large hiatal hernia. Electronically Signed   By: Margarette Canada M.D.   On: 11/09/2016 13:03    EKG: Independently reviewed.  Assessment/Plan Active Problems:   Dyspnea   Cerebral infarction (HCC)   RUE weakness   HTN (hypertension)   PMR (polymyalgia rheumatica) (HCC)   Spastic hemiplegia affecting dominant side (HCC)   Wound of left leg   Hx of PAF- CHADS VASC=7   Pulmonary hypertension (HCC)   Atrial fibrillation with rapid ventricular response (HCC)   Abdominal pain, right upper quadrant   Chronic diastolic CHF (congestive heart failure) (HCC)   Acute on chronic diastolic CHF (congestive heart failure), NYHA class 2 (HCC)   Hyponatremia     Acute respiratory failure likely secondary to CHF  exacerbation and PAF with RVR .  CXR negative for acute pulmonary disease, 2 BNP 50 2.7 troponin 0.01 white count 11.2  Last 2 D echo 05/2015 with vigorous LVF and  EF of 65% to 70%. Osats normal .patient received Cardizem 10 mg x2 while at the ER, then placed on Cardizem drip at 5 mg -  Telemetry Atrial Fibrillation order set  -  TSH  -  Cycle troponins -  Daily weights and strict I/O -  ECHO  Increase Toprol to 75 mg bid CXR in am  Cardiology evaluation appreciated, further recommendations as per Cards   . Atrial Fibrillation CHA2DS2-VASc score 7, was not on oral anticoagulants .patient received Cardizem 10 mg x2 while at the ER, then placed on Cardizem drip at 5 mg for Rate >110 Appreciate Cards input  Continue meds   Polymyalgia Rheumatica, not on any treatment at this time , chronic Right proximal upper extremity weakness    DVT prophylaxis: Lovenox   Code Status:   Full     Family Communication:  Discussed with patient Disposition Plan: Expect patient to be discharged to home after condition improves Consults called:    Cardiology Admission status:Tele  Inpatient   [place], PA-C [facility]   11/09/2016, 3:08 PM

## 2016-11-09 NOTE — Progress Notes (Deleted)
Cardiology Office Note    Date:  11/09/2016  ID:  Krystal Hensley, DOB [birth year], MRN [id number] PCP:  Lajean Manes, MD  Cardiologist:  Dr. Meda Coffee   Chief Complaint: 6 month follow-up of atrial fib  History of Present Illness:  Krystal Hensley is a 83 y.o. female with history of CVA, HTN, PAF (not on anticoagulation due to fall risk/advanced age/patient choice), PMR, arthritis, pulm HTN, chronic diastolic CHF who routine 6 month follow-up. Per review of history, was admitted 2016 SOB, abdominal pain and RUQ pain. She was found to be in AF with RVR, converted in the ED, then reverted back into atrial fibrillation/flutter with RVR. Metoprolol was increased and she returned to normal rhythm. Abd [place] normal. Troponins were normal. Labwork otherwise grossly unremarkable including normal TSH, normal Hgb, BMET OK. She also received low dose IV Lasix for acute on chronic diastolic CHF with improvement in breathing. 2D echo 07/25/14: mild LVH, mild focal basal hypertrophy of septum, EF 65-70%, no RWMA, mildly increased PASP, moderate LAE, mild MR, AI and TR. Last labs 2017 showed Hgb 12, Na 131, K 3.9, BUN 18, Cr 0.87.  Muscle cramps  Paroxysmal atrial fibrillation Chronic diastolic CHF Essential HTN Pulmonary HTN     Past Medical History:  Diagnosis Date  . Arthritis   . Back pain   . Chronic diastolic CHF (congestive heart failure) ([place])   . Colon cancer ([place])   . HTN (hypertension) 08/08/2013  . Hypertension   . Hypoglycemia   . PAF (paroxysmal atrial fibrillation) ([place])   . Polymyalgia rheumatica ([place])   . Pulmonary hypertension    a. Echo 07/2014 - mildly increased PASP.  Marland Kitchen Stroke [facility])     Past Surgical History:  Procedure Laterality Date  . BILATERAL OOPHORECTOMY    . CESAREAN SECTION    . COLON SURGERY    . EYE SURGERY      Current Medications: Current Outpatient Prescriptions  Medication Sig Dispense Refill  . acetaminophen (TYLENOL) 500 MG tablet Take 1 tablet (500  mg total) by mouth every 6 (six) hours as needed. (Patient taking differently: Take 500 mg by mouth every 6 (six) hours as needed for moderate pain. ) 30 tablet 0  . amoxicillin-clavulanate (AUGMENTIN) 875-125 MG tablet Take 1 tablet by mouth 2 (two) times daily. 20 tablet 0  . aspirin 81 MG chewable tablet Chew 1 tablet (81 mg total) by mouth daily.    . calcium-vitamin D (OSCAL WITH D) 500-200 MG-UNIT per tablet Take 1 tablet by mouth daily with breakfast.    . cephALEXin (KEFLEX) 500 MG capsule Take 1 capsule (500 mg total) by mouth 2 (two) times daily. 30 capsule 2  . diclofenac (FLECTOR) 1.3 % PTCH Place 0.5 patches onto the skin 2 (two) times daily as needed (for pain).     Marland Kitchen diltiazem (CARDIZEM CD) 120 MG 24 hr capsule take 1 capsule by mouth once daily 90 capsule 2  . diltiazem (CARDIZEM CD) 120 MG 24 hr capsule Take 1 capsule (120 mg total) by mouth daily. 90 capsule 3  . docusate sodium (COLACE) 100 MG capsule Take 200 mg by mouth daily.     . furosemide (LASIX) 20 MG tablet Take 1 tablet (20 mg total) by mouth daily as needed for edema. 90 tablet 3  . Magnesium Oxide 200 MG TABS Take 1 tab (200 mg total) by mouth daily x 5 days, then take 1 tablet daily as needed thereafter for muscle cramps. 30 tablet  0  . metoprolol succinate (TOPROL-XL) 50 MG 24 hr tablet take 1 tablet by mouth once daily take with food or IMMEDIATELY FOLLOWING A MEAL 90 tablet 2  . omeprazole (PRILOSEC OTC) 20 MG tablet Take 20 mg by mouth daily.     . predniSONE (DELTASONE) 5 MG tablet Take 5 mg by mouth every morning.     . sennosides-docusate sodium (SENOKOT-S) 8.6-50 MG tablet Take 1 tablet by mouth daily as needed for constipation.     . silver sulfADIAZINE (SILVADENE) 1 % cream Apply 1 application topically daily. 50 g 0   No current facility-administered medications for this visit.      Allergies:   Chocolate and Statins   Social History   Social History  . Marital status: Widowed    Spouse name: N/A    . Number of children: N/A  . Years of education: N/A   Social History Main Topics  . Smoking status: Never Smoker  . Smokeless tobacco: Never Used  . Alcohol use No  . Drug use: No  . Sexual activity: No   Other Topics Concern  . Not on file   Social History Narrative  . No narrative on file     Family History:  Family History  Problem Relation Age of Onset  . Aneurysm Mother   . Heart attack Father    ***  ROS:   Please see the history of present illness. Otherwise, review of systems is positive for ***.  All other systems are reviewed and otherwise negative.    PHYSICAL EXAM:   VS:  There were no vitals taken for this visit.  BMI: There is no height or weight on file to calculate BMI. GEN: Well nourished, well developed, in no acute distress  HEENT: normocephalic, atraumatic Neck: no JVD, carotid bruits, or masses Cardiac: ***RRR; no murmurs, rubs, or gallops, no edema  Respiratory:  clear to auscultation bilaterally, normal work of breathing GI: soft, nontender, nondistended, + BS MS: no deformity or atrophy  Skin: warm and dry, no rash Neuro:  Alert and Oriented x 3, Strength and sensation are intact, follows commands Psych: euthymic mood, full affect  Wt Readings from Last 3 Encounters:  03/26/16 113 lb (51.3 kg)  12/02/15 116 lb (52.6 kg)  11/22/15 115 lb (52.2 kg)      Studies/Labs Reviewed:   EKG:  EKG was ordered today and personally reviewed by me and demonstrates *** EKG was not ordered today.***  Recent Labs: 12/02/2015: B Natriuretic Peptide 289.6; BUN 18; Creatinine, Ser 0.87; Hemoglobin 12.0; Platelets 225; Potassium 3.9; Sodium 131   Lipid Panel    Component Value Date/Time   CHOL 216 (H) 08/09/2013 0326   TRIG 116 08/09/2013 0326   HDL 73 08/09/2013 0326   CHOLHDL 3.0 08/09/2013 0326   VLDL 23 08/09/2013 0326   LDLCALC 120 (H) 08/09/2013 0326    Additional studies/ records that were reviewed today include: Summarized  above.***    ASSESSMENT & PLAN:   1. ***  Disposition: F/u with ***   Medication Adjustments/Labs and Tests Ordered: Current medicines are reviewed at length with the patient today.  Concerns regarding medicines are outlined above. Medication changes, Labs and Tests ordered today are summarized above and listed in the Patient Instructions accessible in Encounters.   Raechel Ache PA-C  11/09/2016 10:53 AM    [address], [place], [place]  [address] Phone: [phone]; Fax: [phone]

## 2016-11-09 NOTE — ED Provider Notes (Signed)
[facility] DEPT Provider Note   CSN: [id number] Arrival date & time: 11/09/16  1144     History   Chief Complaint Chief Complaint  Patient presents with  . Shortness of Breath  . Atrial Fibrillation    HPI Krystal Hensley is a 83 y.o. female.  HPI Patient presents with 4 days of nonproductive cough, dyspnea with exertion and generalized weakness. Denies any fever or chills. Denies chest pain or palpitations. No new lower extremity swelling or pain. Denies urinary frequency, urgency or dysuria. Past Medical History:  Diagnosis Date  . Arthritis   . Back pain   . Chronic diastolic CHF (congestive heart failure) ([place])   . Colon cancer ([place])   . HTN (hypertension) 08/08/2013  . Hypertension   . Hypoglycemia   . PAF (paroxysmal atrial fibrillation) ([place])   . Polymyalgia rheumatica ([place])   . Pulmonary hypertension ([place])    a. Echo 07/2014 - mildly increased PASP.  Marland Kitchen Stroke [facility])     Patient Active Problem List   Diagnosis Date Noted  . Shortness of breath 11/09/2016  . Hyponatremia 11/22/2015  . Acute on chronic diastolic CHF (congestive heart failure), NYHA class 2 ([place]) 02/15/2015  . Dyspnea 07/24/2014  . Abdominal pain, right upper quadrant 07/24/2014  . Chronic diastolic CHF (congestive heart failure) ([place]) 07/24/2014  . Atrial fibrillation with RVR ([place]) 06/14/2014  . Constipation 04/03/2014  . Pulmonary hypertension ([place]) 02/02/2014  . Chest pain 01/14/2014  . Cerebral infarction ([place]) 08/08/2013  . RUE weakness 08/08/2013  . HTN (hypertension) 08/08/2013  . PMR (polymyalgia rheumatica) ([facility]) 08/08/2013  . Spastic hemiplegia affecting dominant side ([place]) 08/08/2013  . Wound of left leg 08/08/2013  . Hx of PAF- CHADS VASC=7 08/08/2013    Past Surgical History:  Procedure Laterality Date  . BILATERAL OOPHORECTOMY    . CESAREAN SECTION    . COLON SURGERY    . EYE SURGERY      OB History    No data available       Home Medications    Prior to  Admission medications   Medication Sig Start Date End Date Taking? Authorizing Provider  acetaminophen (TYLENOL) 500 MG tablet Take 1 tablet (500 mg total) by mouth every 6 (six) hours as needed. Patient taking differently: Take 500 mg by mouth every 6 (six) hours as needed for moderate pain.  06/15/14  Yes Kilroy, Luke K, PA-C  docusate sodium (COLACE) 100 MG capsule Take 200 mg by mouth daily.    Yes [provider]  metoprolol succinate (TOPROL-XL) 50 MG 24 hr tablet take 1 tablet by mouth once daily take with food or IMMEDIATELY FOLLOWING A MEAL 06/10/16  Yes Dorothy Spark, MD  Multiple Vitamins-Minerals (VISION FORMULA/LUTEIN PO) Take 1 tablet by mouth daily at 6 (six) AM.   Yes [provider]  sennosides-docusate sodium (SENOKOT-S) 8.6-50 MG tablet Take 1 tablet by mouth daily as needed for constipation.    Yes [provider]  apixaban (ELIQUIS) 2.5 MG TABS tablet Take 1 tablet (2.5 mg total) by mouth 2 (two) times daily. 11/11/16   Thurnell Lose, MD  calcium-vitamin D (OSCAL WITH D) 500-200 MG-UNIT per tablet Take 1 tablet by mouth daily with breakfast.    [provider]  diclofenac (FLECTOR) 1.3 % PTCH Place 0.5 patches onto the skin 2 (two) times daily as needed (for pain).     [provider]  diltiazem (CARDIZEM CD) 180 MG 24 hr capsule Take 1 capsule (  180 mg total) by mouth daily. 11/12/16   Thurnell Lose, MD  furosemide (LASIX) 20 MG tablet Take 1 tablet (20 mg total) by mouth daily as needed for edema. Patient not taking: Reported on 11/09/2016 11/22/15   Dorothy Spark, MD  Magnesium Oxide 200 MG TABS Take 1 tab (200 mg total) by mouth daily x 5 days, then take 1 tablet daily as needed thereafter for muscle cramps. Patient not taking: Reported on 11/09/2016 03/26/16   Dorothy Spark, MD  pantoprazole (PROTONIX) 40 MG tablet Take 1 tablet (40 mg total) by mouth daily. 11/12/16   Thurnell Lose, MD  predniSONE (DELTASONE) 5 MG  tablet Take 5 mg by mouth every morning.     [provider]  silver sulfADIAZINE (SILVADENE) 1 % cream Apply 1 application topically daily. Patient not taking: Reported on 11/09/2016 04/30/16   Trula Slade, DPM    Family History Family History  Problem Relation Age of Onset  . Aneurysm Mother   . Heart attack Father     Social History Social History  Substance Use Topics  . Smoking status: Never Smoker  . Smokeless tobacco: Never Used  . Alcohol use No     Allergies   Chocolate and Statins   Review of Systems Review of Systems  Constitutional: Positive for activity change and fatigue. Negative for chills and fever.  Respiratory: Positive for cough and shortness of breath. Negative for wheezing.   Cardiovascular: Negative for chest pain, palpitations and leg swelling.  Gastrointestinal: Negative for abdominal pain, diarrhea, nausea and vomiting.  Genitourinary: Negative for dysuria, flank pain and frequency.  Musculoskeletal: Positive for myalgias and neck pain. Negative for back pain and neck stiffness.  Neurological: Positive for weakness. Negative for dizziness, light-headedness, numbness and headaches.  All other systems reviewed and are negative.    Physical Exam Updated Vital Signs BP 115/86 (BP Location: Left Arm)   Pulse 99   Temp 97.7 F (36.5 C) (Oral)   Resp 20   Ht 5' (1.524 m)   Wt 113 lb 8 oz (51.5 kg) Comment: Scale C  SpO2 99%   BMI 22.17 kg/m   Physical Exam  Constitutional: She is oriented to person, place, and time. She appears well-developed and well-nourished. No distress.  HENT:  Head: Normocephalic and atraumatic.  Mouth/Throat: Oropharynx is clear and moist. No oropharyngeal exudate.  Eyes: EOM are normal. Pupils are equal, round, and reactive to light.  Neck: Normal range of motion. Neck supple.  Mild tenderness to palpation over the right paracervical and trapezius musculature. No meningismus. No lymphadenopathy.    Cardiovascular:  Tachycardia. Irregularly irregular.  Pulmonary/Chest: Effort normal and breath sounds normal. No respiratory distress. She has no wheezes. She has no rales. She exhibits no tenderness.  Mild tachypnea  Abdominal: Soft. Bowel sounds are normal. There is no tenderness. There is no rebound and no guarding.  Musculoskeletal: Normal range of motion. She exhibits no edema or tenderness.  Chronic hammertoe deformities. Lower extremity swelling, asymmetry or tenderness. Distal pulses intact.  Neurological: She is alert and oriented to person, place, and time.  Moving all extremities without focal deficit. Sensation fully intact.  Skin: Skin is warm and dry. No rash noted. No erythema.  Psychiatric: She has a normal mood and affect. Her behavior is normal.  Nursing note and vitals reviewed.    ED Treatments / Results  Labs (all labs ordered are listed, but only abnormal results are displayed) Labs Reviewed  BASIC  METABOLIC PANEL - Abnormal; Notable for the following:       Result Value   Sodium 133 (*)    All other components within normal limits  CBC - Abnormal; Notable for the following:    WBC 11.2 (*)    All other components within normal limits  BRAIN NATRIURETIC PEPTIDE - Abnormal; Notable for the following:    B Natriuretic Peptide 502.7 (*)    All other components within normal limits  BASIC METABOLIC PANEL - Abnormal; Notable for the following:    Glucose, Bld 112 (*)    Calcium 8.7 (*)    All other components within normal limits  GLUCOSE, CAPILLARY - Abnormal; Notable for the following:    Glucose-Capillary 108 (*)    All other components within normal limits  BASIC METABOLIC PANEL - Abnormal; Notable for the following:    Glucose, Bld 108 (*)    Calcium 8.7 (*)    All other components within normal limits  URINALYSIS, ROUTINE W REFLEX MICROSCOPIC  MAGNESIUM  TSH  TROPONIN I  TROPONIN I  TROPONIN I  I-STAT TROPOININ, ED    EKG  EKG  Interpretation  Date/Time:  Sunday Nov 09 2016 13:31:28 EDT Ventricular Rate:  69 PR Interval:    QRS Duration: 110 QT Interval:  488 QTC Calculation: 523 R Axis:   173 Text Interpretation:  Atrial flutter with predominant 4:1 AV block Anterior infarct, old Nonspecific repol abnormality, lateral leads Prolonged QT interval Baseline wander in lead(s) V5 No significant change since last tracing Confirmed by Theotis Burrow [phone]) on 11/10/2016 12:19:15 PM       Radiology No results found.  Procedures Procedures (including critical care time)  Medications Ordered in ED Medications  diltiazem (CARDIZEM) injection 10 mg (10 mg Intravenous Given 11/09/16 1226)  levofloxacin (LEVAQUIN) IVPB 500 mg (0 mg Intravenous Stopped 11/09/16 1733)  sodium chloride 0.9 % bolus 500 mL (500 mLs Intravenous New Bag/Given 11/09/16 1554)  diltiazem (CARDIZEM) tablet 60 mg (60 mg Oral Given 11/11/16 1121)   Patient inverted to normal rhythm with IV dose of Cardizem. She had persistent dyspnea with minimal exertion. Concern for possible pneumonia versus pulmonary edema. Given dose of IV antibiotics and discussed with hospitalist. Initial Impression / Assessment and Plan / ED Course  I have reviewed the triage vital signs and the nursing notes.  Pertinent labs & imaging results that were available during my care of the patient were reviewed by me and considered in my medical decision making (see chart for details).       Final Clinical Impressions(s) / ED Diagnoses   Final diagnoses:  Atrial fibrillation with RVR ([place])  Dyspnea on exertion    New Prescriptions Discharge Medication List as of 11/11/2016 11:32 AM    START taking these medications   Details  apixaban (ELIQUIS) 2.5 MG TABS tablet Take 1 tablet (2.5 mg total) by mouth 2 (two) times daily., Starting Tue 11/11/2016, Print    pantoprazole (PROTONIX) 40 MG tablet Take 1 tablet (40 mg total) by mouth daily., Starting Wed 11/12/2016, Print          Julianne Rice, MD 11/12/16 1501

## 2016-11-10 ENCOUNTER — Inpatient Hospital Stay (HOSPITAL_COMMUNITY): Payer: Medicare Other

## 2016-11-10 ENCOUNTER — Ambulatory Visit: Payer: Medicare Other | Admitting: Physician Assistant

## 2016-11-10 DIAGNOSIS — I5033 Acute on chronic diastolic (congestive) heart failure: Secondary | ICD-10-CM

## 2016-11-10 DIAGNOSIS — I4891 Unspecified atrial fibrillation: Secondary | ICD-10-CM

## 2016-11-10 LAB — BASIC METABOLIC PANEL
Anion gap: 7 (ref 5–15)
BUN: 12 mg/dL (ref 6–20)
CALCIUM: 8.7 mg/dL — AB (ref 8.9–10.3)
CO2: 24 mmol/L (ref 22–32)
CREATININE: 0.6 mg/dL (ref 0.44–1.00)
Chloride: 105 mmol/L (ref 101–111)
GFR calc non Af Amer: >60 mL/min (ref >60)
GLUCOSE: 112 mg/dL — AB (ref 65–99)
Potassium: 4.7 mmol/L (ref 3.5–5.1)
Sodium: 136 mmol/L (ref 135–145)

## 2016-11-10 LAB — TROPONIN I: Troponin I: <0.03 ng/mL (ref <0.03)

## 2016-11-10 MED ORDER — FUROSEMIDE 10 MG/ML IJ SOLN
40.0000 mg | Freq: Every day | INTRAMUSCULAR | Status: DC
  Administered 2016-11-10 – 2016-11-11 (×2): 40 mg via INTRAVENOUS
  Filled 2016-11-10 (×2): qty 4

## 2016-11-10 NOTE — Evaluation (Signed)
Occupational Therapy Evaluation Patient Details Name: Krystal Hensley MRN: [id number] DOB: [birth year] Today's Date: 11/10/2016    History of Present Illness pt is a 83 y/o female admitted with dry cough in CHF exacerbation.  PMH significant for CHF, HTN, PAF, pulmonary HTN, and CVA   Clinical Impression   Patient presenting with decreased I in self care, balance, endurance, safety awareness, strength.  Patient performed self care at [facility] I level per PTA. Various other family members and paid caregivers providing meals and assisting with IADLs. Patient currently functioning at supervision - min A. Patient will benefit from acute OT to increase overall independence in the areas of ADLs, functional mobility, safety in order to safely discharge home. Daughter present in room and agrees that she would like [facility] for home safety evaluation and follow up for mother.     Follow Up Recommendations  Home health OT;Supervision - Intermittent    Equipment Recommendations  None recommended by OT    Recommendations for Other Services       Precautions / Restrictions Precautions Precautions: None Restrictions Weight Bearing Restrictions: No      Mobility Bed Mobility Overal bed mobility: Independent         Transfers Overall transfer level: Needs assistance Equipment used: Rolling walker (2 wheeled) Transfers: Sit to/from Stand;Stand Pivot Transfers Sit to Stand: Supervision Stand pivot transfers: Supervision            Balance Overall balance assessment: Needs assistance Sitting-balance support: No upper extremity supported;Feet supported Sitting balance-Leahy Scale: Good     Standing balance support: Bilateral upper extremity supported Standing balance-Leahy Scale: Fair Standing balance comment: RW for support         ADL either performed or assessed with clinical judgement   ADL Overall ADL's : Needs assistance/impaired           General ADL Comments: Pt reports  being mod I with self care at home PTA. Pt currently needs supervision - min A with functional tasks and increased fatigue with tasks.                   Pertinent Vitals/Pain Pain Assessment: No/denies pain     Hand Dominance Right   Extremity/Trunk Assessment Upper Extremity Assessment Upper Extremity Assessment: Generalized weakness;RUE deficits/detail;LUE deficits/detail RUE Deficits / Details: shoulder flexion limited to 80 degrees LUE Deficits / Details: limited to 80 degrees   Lower Extremity Assessment Lower Extremity Assessment: Defer to PT evaluation   Cervical / Trunk Assessment Cervical / Trunk Assessment: Kyphotic   Communication Communication Communication: No difficulties   Cognition Arousal/Alertness: Awake/alert Behavior During Therapy: WFL for tasks assessed/performed Overall Cognitive Status: Within Functional Limits for tasks assessed                      Home Living Family/patient expects to be discharged to:: Private residence Living Arrangements: Alone Available Help at Discharge: Family;Available PRN/intermittently Type of Home: House Home Access: Stairs to enter [employer] of Steps: 3 Entrance Stairs-Rails: Can reach both Home Layout: One level     Bathroom Shower/Tub: Teacher, early years/pre: Standard Bathroom Accessibility: Yes   Home Equipment: Walker - 4 wheels;Grab bars - toilet;Grab bars - tub/shower          Prior Functioning/Environment Level of Independence: Independent with assistive device(s)        Comments: knitting and crafts        OT Problem List: Decreased strength;Decreased activity tolerance;Impaired balance (sitting  and/or standing);Decreased safety awareness;Decreased knowledge of use of DME or AE      OT Treatment/Interventions: Self-care/ADL training;Therapeutic exercise;Neuromuscular education;Energy conservation;DME and/or AE instruction;Therapeutic  activities;Patient/family education    OT Goals(Current goals can be found in the care plan section) Acute Rehab OT Goals Patient Stated Goal: to go home OT Goal Formulation: With patient/family Time For Goal Achievement: 11/24/16 Potential to Achieve Goals: Good ADL Goals Pt Will Perform Grooming: with modified independence Pt Will Perform Upper Body Bathing: with modified independence Pt Will Perform Lower Body Bathing: with modified independence Pt Will Perform Upper Body Dressing: with modified independence Pt Will Perform Lower Body Dressing: with modified independence Pt Will Transfer to Toilet: with modified independence Pt Will Perform Toileting - Clothing Manipulation and hygiene: with modified independence  OT Frequency: Min 2X/week   Barriers to D/C:    none known at this time          AM-PAC PT "6 Clicks" Daily Activity     Outcome Measure Help from another person eating meals?: A Little Help from another person taking care of personal grooming?: A Little Help from another person toileting, which includes using toliet, bedpan, or urinal?: A Little Help from another person bathing (including washing, rinsing, drying)?: A Little Help from another person to put on and taking off regular upper body clothing?: A Little Help from another person to put on and taking off regular lower body clothing?: A Little 6 Click Score: 18   End of Session Equipment Utilized During Treatment: Rolling walker  Activity Tolerance: Patient tolerated treatment well Patient left: in bed;with call bell/phone within reach;with family/visitor present  OT Visit Diagnosis: Muscle weakness (generalized) (M62.81)                Time: 7628-3151 OT Time Calculation (min): 24 min Charges:  OT General Charges $OT Visit: 1 Procedure OT Evaluation $OT Eval Low Complexity: 1 Procedure OT Treatments $Self Care/Home Management : 8-22 mins G-Codes:       Gypsy Decant 11/10/2016, 3:58 PM

## 2016-11-10 NOTE — Consult Note (Signed)
Cardiology Consult    Patient ID: Krystal Hensley MRN: [id number], DOB/AGE: 1918/03/27   Admit date: 11/09/2016 Date of Consult: 11/10/2016  Primary Physician: Lajean Manes, MD Reason for Consult: atrial fibrillation and CHF Primary Cardiologist: Dr. Ena Dawley Requesting Provider: Dr. Candiss Norse  History of Present Illness    Krystal Hensley is a 83 y.o. female who is being seen today for the evaluation of atrial fibrillation and CHF at the request of Dr Candiss Norse. The patient has a past medical history significant for CVA, HTN, PAF (not on anticoagulation due to fall risk/advanced age/patient choice), PMR, arthritis, pulmonary HTN, chronic diastolic heart failure. She was admitted on 11/09/2016 for evaluation of a 4 day history of non-productive cough and increasing shortness of breath. She was found to be in atrial fibrillation and BNP was elevated at 502.7.   The patient developed shortness of breath with her housework and laundry about 4 days ago, which has been getting progressively worse. She denies chest pain, edema, palpitations or lightheadedness. She has had no perceived increase in her usual 3 pillow orthopnea. She has had some right neck discomfort at night that she attributes to positioning on her pillows. She did not take her as needed lasix as she did not have any edema.   She feels that her breathing is somewhat better than on admission. She reports good urine output after lasix this morning.   CXR 11/09/16  showed No evidence of acute cardiopulmonary disease. Cardiomegaly, chronic interstitial opacities and moderate to large hiatal hernia. CXR 11/10/16  Showed findings felt to represent a degree of congestive heart failure superimposed on underlying pulmonary fibrotic type change. There is aortic atherosclerosis. There is a moderate hiatal hernia.  She was admitted 07/25/15-07/27/15 for AF with RVR, metoprolol was increased with resumption of sinus rhythm and she was gently  diuresed for acute on chronic diastolic heart failure with improvement. At follow up in 03/2016 she continued in sinus rhythm.   Her usual weight seems to be 113-116 lbs.  Past Medical History   Past Medical History:  Diagnosis Date  . Arthritis   . Back pain   . Chronic diastolic CHF (congestive heart failure) ([place])   . Colon cancer ([place])   . HTN (hypertension) 08/08/2013  . Hypertension   . Hypoglycemia   . PAF (paroxysmal atrial fibrillation) ([place])   . Polymyalgia rheumatica ([place])   . Pulmonary hypertension ([place])    a. Echo 07/2014 - mildly increased PASP.  Marland Kitchen Stroke [facility])     Past Surgical History:  Procedure Laterality Date  . BILATERAL OOPHORECTOMY    . CESAREAN SECTION    . COLON SURGERY    . EYE SURGERY       Allergies  Allergies  Allergen Reactions  . Chocolate Other (See Comments)    Unknown  . Statins Other (See Comments)    myalgia    Inpatient Medications    . aspirin  81 mg Oral Daily  . diltiazem  120 mg Oral Daily  . diltiazem  10 mg Intravenous Once  . docusate sodium  200 mg Oral Daily  . enoxaparin (LOVENOX) injection  30 mg Subcutaneous Q24H  . furosemide  40 mg Intravenous Daily  . metoprolol succinate  75 mg Oral BID  . pantoprazole  40 mg Oral Daily  . sodium chloride flush  3 mL Intravenous Q12H    Family History    Family History  Problem Relation Age of Onset  . Aneurysm Mother   .  Heart attack Father      Social History    Social History   Social History  . Marital status: Widowed    Spouse name: N/A  . Number of children: N/A  . Years of education: N/A   Occupational History  . Not on file.   Social History Main Topics  . Smoking status: Never Smoker  . Smokeless tobacco: Never Used  . Alcohol use No  . Drug use: No  . Sexual activity: No   Other Topics Concern  . Not on file   Social History Narrative  . No narrative on file     Review of Systems   General:  No chills, fever, night sweats or weight  changes.  Cardiovascular: Positive for DOE and chronic orthopnea No chest pain, edema, palpitations, paroxysmal nocturnal dyspnea. Dermatological: No rash, lesions/masses Respiratory: No cough, Positive for dyspnea on exertion Urologic: No hematuria, dysuria Abdominal:   No nausea, vomiting, diarrhea, bright red blood per rectum, melena, or hematemesis Neurologic:  No visual changes, wkns, changes in mental status. All other systems reviewed and are otherwise negative except as noted above.  Physical Exam    Blood pressure (!) 132/105, pulse 93, temperature 97.5 F (36.4 C), temperature source Oral, resp. rate 18, height 5' (1.524 m), weight 111 lb 12.8 oz (50.7 kg), SpO2 94 %.  General: Pleasant, NAD Psych: Normal affect. Neuro: Alert and oriented X 3. Moves all extremities spontaneously. HEENT: Normal  Neck: Supple without bruits or JVD. Lungs:  Resp regular and unlabored, CTA. Heart: Irregularly irregular rhythm, no s3, s4, or murmurs. Abdomen: Soft, non-tender, non-distended, BS + x 4.  Extremities: No clubbing, cyanosis or edema. DP/PT/Radials 2+ and equal bilaterally.  Labs    Troponin [facility] Test)  Recent Labs  11/09/16 1227  TROPIPOC 0.01    Recent Labs  11/09/16 1658 11/09/16 2109 11/10/16 0321  TROPONINI <0.03 <0.03 <0.03   Lab Results  Component Value Date   WBC 11.2 (H) 11/09/2016   HGB 13.9 11/09/2016   HCT 43.2 11/09/2016   MCV 96.2 11/09/2016   PLT 251 11/09/2016    Recent Labs Lab 11/10/16 0321  NA 136  K 4.7  CL 105  CO2 24  BUN 12  CREATININE 0.60  CALCIUM 8.7*  GLUCOSE 112*   Lab Results  Component Value Date   CHOL 216 (H) 08/09/2013   HDL 73 08/09/2013   LDLCALC 120 (H) 08/09/2013   TRIG 116 08/09/2013   No results found for: [facility]   Radiology Studies    Dg Chest 2 View  Result Date: 11/10/2016 CLINICAL DATA:  Congestive heart failure EXAM: CHEST  2 VIEW COMPARISON:  Nov 09, 2016 and July 24, 2014 FINDINGS: There  is underlying interstitial prominence, felt to represent underlying fibrotic type change. There is also felt to be a degree of interstitial edema superimposed on fibrosis. There is no airspace consolidation. There is cardiomegaly with pulmonary venous hypertension. No adenopathy. There is aortic atherosclerosis. There is a moderate hiatal hernia. Bones are diffusely osteoporotic. There is superior migration of each humeral head. There is evidence of old rib trauma on the right, healed. There is thoracolumbar levoscoliosis. No pneumothorax. IMPRESSION: Findings felt to represent a degree of congestive heart failure superimposed on underlying pulmonary fibrotic type change. There is aortic atherosclerosis. There is a moderate hiatal hernia. Bones are diffusely osteoporotic. There is superior migration of each humeral head, felt to be indicative of chronic rotator cuff tears. Electronically Signed  By: Lowella Grip III M.D.   On: 11/10/2016 07:37   Dg Chest 2 View  Result Date: 11/09/2016 CLINICAL DATA:  Shortness of breath and chest pain for 4 days. EXAM: CHEST  2 VIEW COMPARISON:  12/02/2015 and prior radiograph FINDINGS: Cardiomegaly and chronic interstitial pulmonary opacities again noted. There is no evidence of focal airspace disease, pulmonary edema, suspicious pulmonary nodule/mass, pleural effusion, or pneumothorax. No acute bony abnormalities are identified. A moderate to large hiatal hernia is again noted. IMPRESSION: No evidence of acute cardiopulmonary disease. Cardiomegaly, chronic interstitial opacities and moderate to large hiatal hernia. Electronically Signed   By: Margarette Canada M.D.   On: 11/09/2016 13:03    EKG & Cardiac Imaging   EKG: Atrial fib vs flutter, 106 bpm, old anteroseptal infarct, no new ischemic changes  Echocardiogram: Pending  Last echo 07/25/2014 Study Conclusions  - Left ventricle: The cavity size was normal. Wall thickness was increased in a pattern of mild LVH.  There was mild focal basal hypertrophy of the septum. Systolic function was vigorous. The estimated ejection fraction was in the range of 65% to 70%. Wall motion was normal; there were no regional wall motion abnormalities. - Aortic valve: There was mild regurgitation. - Mitral valve: Calcified annulus. There was mild regurgitation. - Left atrium: The atrium was moderately dilated. - Pulmonary arteries: Systolic pressure was mildly increased.  Impressions:  - Normal LV function; mild LVH; moderate LAE; mild MR, AI and TR; mildly elevated pulmonary pressure.  Assessment & Plan    Paroxysmal atrial fibrillation -This is known and has been controlled in the past with beta blocker -Currently with rates in the 120's-130's, better this afternoon with rates in the 70's-90's. -CHA2DS2/VAS Stroke Risk Score is 7 (CHF, HTN, Age (2), stroke (2), female). The patient has elected to forgo anticoagulation in the past due to falling/advanced age and perceived easy bleeding. She is treated with aspirin 81 mg. Discussed with pt and she would like to discuss with MD. -Her current regimen is Toprol XL 50 mg daily and diltiazem CD 120 mg daily -She received diltiazem 10 mg IV bolus X 1 and home diltiazem resumed. Toprol XL was increased to 75 mg bid. -Her heart rates are fluctuating with rates in the 120'-130's this morning, this afternoon, continues in a-flutter vs fib in the 70's-90's.     Acute on chronic diastolic heart failure -Treated at home with Toprol XL 50 mg daily, lasix 20 mg daily as needed for edema. Pt did not take prn lasix at home as she had no edema.  -Likely worsened in setting of atrial fib with RVR.  -Received Lasix 40 mg IV today (on for daily) -Weight 113-->111. (Last office visit weights 113-115). I&O net negative 380 mL.  -Breathing somewhat better per pt.   Hypertension -BP stable  Signed, Daune Perch, NP-C 11/10/2016, 2:52 PM Pager: [phone]  Patient  examined chart reviewed Discussed care with patient and daughter "Eduard Clos".  Spry elderly female. PAF and diastolic CHF. Latter improved with diuresis Reviewed note from Dr Meda Coffee September 2017 and anticoagulation not given due to perceived risks and fact patient was in NSR then  She has an 11% risk of stroke and I think risk of bleeding on low dose eliquis is a lot less if used 2.5 bid dosing. She has not fallen in over a year and is very careful using a walker around the house and cane when she goes out. Exam with frail thin elderly female no murmur  clear lungs and venous stasis with no LE edema. She and daughter will talk with family and make a decision about possibly starting eliquis 2.5 bid. Diastolic CHF improved as is rate control   This patients CHA2DS2-VASc Score and unadjusted Ischemic Stroke Rate (% per year) is equal to 11.2 % stroke rate/year from a score of 7  Above score calculated as 1 point each if present [CHF, HTN, DM, Vascular=MI/PAD/Aortic Plaque, Age if 65-74, or Female] Above score calculated as 2 points each if present [Age > 75, or Stroke/TIA/TE]  Jenkins Rouge

## 2016-11-10 NOTE — Progress Notes (Signed)
PROGRESS NOTE                                                                                                                                                                                                             Patient Demographics:    Krystal Hensley, is a 83 y.o. female, DOB - 10/01/1917, AYT:[id number]  Admit date - 11/09/2016   Admitting Physician Lady Deutscher, MD  Outpatient Primary MD for the patient is Lajean Manes, MD  LOS - 1  Chief Complaint  Patient presents with  . Shortness of Breath  . Atrial Fibrillation       Brief Narrative  Krystal Hensley is a 83 y.o. female with medical history significant for HTN, Chronic DHF, PAF, Polymyalgia Rheumatica,  Pulmonary Hypertension, prior CVA, presenting with 4 day history of non-productive cough, increasing shortness of breath, Was admitted with acute on chronic diastolic CHF and atrial fibrillation.   Subjective:    Duard Hensley today has, No headache, No chest pain, No abdominal pain - No Nausea, No new weakness tingling or numbness, No Cough - Improved shortness of breath.   Assessment  & Plan :     1.Acute on chronic diastolic heart failure with last EF around 65%. Likely caused by paroxysmal A. fib RVR, she is currently much better after Lasix IV, her heart rate was controlled on diltiazem along with beta blocker, no shortness of breath or hypoxia at this time, TSH is stable, echocardiogram pending. Cardiology to eval.  2. Paroxysmal atrial fibrillation with RVR [place] vasc 2 score of at least 6. Currently on Cardizem and beta blocker, TSH stable, echo pending, she is currently on aspirin, has not been placed on anticoagulation previously will defer this to cardiology.  3. Polymyalgia rheumatica. She has chronic right upper extremity weakness which continues, continue supportive care, she is not on steroids on any treatment.  4. GERD. On  PPI.   Diet : Diet Heart Room service appropriate? Yes; Fluid consistency: Thin    Family Communication  : None  Code Status :  DNR ( discussed with the patient 11-10-16)  Disposition Plan  :  TBD  Consults  :  Cards  Procedures  :    TTE  DVT Prophylaxis  :  Lovenox    Lab Results  Component Value Date   PLT  251 11/09/2016    Inpatient Medications  Scheduled Meds: . aspirin  81 mg Oral Daily  . diltiazem  120 mg Oral Daily  . diltiazem  10 mg Intravenous Once  . docusate sodium  200 mg Oral Daily  . enoxaparin (LOVENOX) injection  30 mg Subcutaneous Q24H  . metoprolol succinate  75 mg Oral BID  . pantoprazole  40 mg Oral Daily  . sodium chloride flush  3 mL Intravenous Q12H   Continuous Infusions: . sodium chloride     PRN Meds:.sodium chloride, ALPRAZolam, ondansetron (ZOFRAN) IV, senna-docusate  Antibiotics  :    Anti-infectives    Start     Dose/Rate Route Frequency Ordered Stop   11/09/16 1530  levofloxacin (LEVAQUIN) IVPB 500 mg     500 mg 100 mL/hr over 60 Minutes Intravenous  Once 11/09/16 1453 11/09/16 1733         Objective:   Vitals:   11/09/16 1645 11/09/16 1700 11/09/16 1952 11/10/16 0443  BP: 139/83  (!) 147/78 (!) 130/59  Pulse: 91  (!) 44 93  Resp: 20  18 18   Temp: 97.6 F (36.4 C)  97.5 F (36.4 C) 98.7 F (37.1 C)  TempSrc: Oral  Oral Oral  SpO2: 99%  98% 98%  Weight:  51.5 kg (113 lb 8 oz)  50.7 kg (111 lb 12.8 oz)  Height:  5' (1.524 m)      Wt Readings from Last 3 Encounters:  11/10/16 50.7 kg (111 lb 12.8 oz)  03/26/16 51.3 kg (113 lb)  12/02/15 52.6 kg (116 lb)     Intake/Output Summary (Last 24 hours) at 11/10/16 0913 Last data filed at 11/10/16 0444  Gross per 24 hour  Intake              220 ml  Output              600 ml  Net             -380 ml     Physical Exam  Awake Alert, Oriented X 3, No new F.N deficits, Normal affect [place].AT,PERRAL Supple Neck,No JVD, No cervical lymphadenopathy appriciated.   Symmetrical Chest wall movement, Good air movement bilaterally, few rales RRR,No Gallops,Rubs or new Murmurs, No Parasternal Heave +ve B.Sounds, Abd Soft, No tenderness, No organomegaly appriciated, No rebound - guarding or rigidity. No Cyanosis, Clubbing or edema, No new Rash or bruise     Data Review:    CBC  Recent Labs Lab 11/09/16 1200  WBC 11.2*  HGB 13.9  HCT 43.2  PLT 251  MCV 96.2  MCH 31.0  MCHC 32.2  RDW 14.1    Chemistries   Recent Labs Lab 11/09/16 1200 11/10/16 0321  NA 133* 136  K 4.8 4.7  CL 101 105  CO2 23 24  GLUCOSE 97 112*  BUN 15 12  CREATININE 0.67 0.60  CALCIUM 9.0 8.7*  MG 2.2  --    ------------------------------------------------------------------------------------------------------------------ No results for input(s): CHOL, HDL, LDLCALC, TRIG, CHOLHDL, LDLDIRECT in the last 72 hours.  Lab Results  Component Value Date   HGBA1C 5.9 (H) 08/09/2013   ------------------------------------------------------------------------------------------------------------------  Recent Labs  11/09/16 1223  TSH 1.171   ------------------------------------------------------------------------------------------------------------------ No results for input(s): VITAMINB12, FOLATE, FERRITIN, TIBC, IRON, RETICCTPCT in the last 72 hours.  Coagulation profile No results for input(s): INR, PROTIME in the last 168 hours.  No results for input(s): DDIMER in the last 72 hours.  Cardiac Enzymes  Recent Labs Lab 11/09/16 1658  11/09/16 2109 11/10/16 0321  TROPONINI <0.03 <0.03 <0.03   ------------------------------------------------------------------------------------------------------------------    Component Value Date/Time   BNP 502.7 (H) 11/09/2016 1200    Micro Results No results found for this or any previous visit (from the past 240 hour(s)).  Radiology Reports Dg Chest 2 View  Result Date: 11/10/2016 CLINICAL DATA:  Congestive heart  failure EXAM: CHEST  2 VIEW COMPARISON:  Nov 09, 2016 and July 24, 2014 FINDINGS: There is underlying interstitial prominence, felt to represent underlying fibrotic type change. There is also felt to be a degree of interstitial edema superimposed on fibrosis. There is no airspace consolidation. There is cardiomegaly with pulmonary venous hypertension. No adenopathy. There is aortic atherosclerosis. There is a moderate hiatal hernia. Bones are diffusely osteoporotic. There is superior migration of each humeral head. There is evidence of old rib trauma on the right, healed. There is thoracolumbar levoscoliosis. No pneumothorax. IMPRESSION: Findings felt to represent a degree of congestive heart failure superimposed on underlying pulmonary fibrotic type change. There is aortic atherosclerosis. There is a moderate hiatal hernia. Bones are diffusely osteoporotic. There is superior migration of each humeral head, felt to be indicative of chronic rotator cuff tears. Electronically Signed   By: Lowella Grip III M.D.   On: 11/10/2016 07:37   Dg Chest 2 View  Result Date: 11/09/2016 CLINICAL DATA:  Shortness of breath and chest pain for 4 days. EXAM: CHEST  2 VIEW COMPARISON:  12/02/2015 and prior radiograph FINDINGS: Cardiomegaly and chronic interstitial pulmonary opacities again noted. There is no evidence of focal airspace disease, pulmonary edema, suspicious pulmonary nodule/mass, pleural effusion, or pneumothorax. No acute bony abnormalities are identified. A moderate to large hiatal hernia is again noted. IMPRESSION: No evidence of acute cardiopulmonary disease. Cardiomegaly, chronic interstitial opacities and moderate to large hiatal hernia. Electronically Signed   By: Margarette Canada M.D.   On: 11/09/2016 13:03    Time Spent in minutes  30   Lala Lund M.D on 11/10/2016 at 9:13 AM  Between 7am to 7pm - Pager - [phone] ( page via [email or web address], text pages only, please mention full 10 digit call back  number). After 7pm go to [email or web address] - password [email or web address]

## 2016-11-10 NOTE — Evaluation (Signed)
Physical Therapy Evaluation Patient Details Name: Krystal Hensley MRN: [id number] DOB: [birth year] Today's Date: 11/10/2016   History of Present Illness  pt is a 83 y/o female admitted with dry cough in CHF exacerbation.  PMH significant for CHF, HTN, PAF, pulmonary HTN, and CVA  Clinical Impression  Pt tolerated session well.  No balance deficits noted.  Ambulates short community level distance mod I with RW.  Has home DME and PRN assist from an aid and family.  No skilled PT needs identified.  No f/u recommended.     Follow Up Recommendations No PT follow up    Equipment Recommendations  None recommended by PT    Recommendations for Other Services       Precautions / Restrictions Precautions Precautions: None Restrictions Weight Bearing Restrictions: No      Mobility  Bed Mobility Overal bed mobility: Independent                Transfers Overall transfer level: Modified independent Equipment used: Rolling walker (2 wheeled)                Ambulation/Gait Ambulation/Gait assistance: Modified independent (Device/Increase time) Ambulation Distance (Feet): 300 Feet Assistive device: Rolling walker (2 wheeled) Gait Pattern/deviations: Trunk flexed Gait velocity: decreased Gait velocity interpretation: Below normal speed for age/gender    Stairs            Wheelchair Mobility    Modified Rankin (Stroke Patients Only)       Balance Overall balance assessment: Needs assistance Sitting-balance support: No upper extremity supported;Feet supported Sitting balance-Leahy Scale: Good     Standing balance support: Bilateral upper extremity supported Standing balance-Leahy Scale: Fair                               Pertinent Vitals/Pain Pain Assessment: No/denies pain    Home Living Family/patient expects to be discharged to:: Private residence Living Arrangements: Alone Available Help at Discharge: Family;Available  PRN/intermittently Type of Home: House Home Access: Stairs to enter Entrance Stairs-Rails: Can reach both Entrance Stairs-Number of Steps: 3 Home Layout: One level Home Equipment: Walker - 4 wheels;Grab bars - toilet;Grab bars - tub/shower      Prior Function Level of Independence: Independent with assistive device(s)               Hand Dominance        Extremity/Trunk Assessment        Lower Extremity Assessment Lower Extremity Assessment: Overall WFL for tasks assessed    Cervical / Trunk Assessment Cervical / Trunk Assessment: Kyphotic  Communication   Communication: No difficulties  Cognition Arousal/Alertness: Awake/alert Behavior During Therapy: WFL for tasks assessed/performed Overall Cognitive Status: Within Functional Limits for tasks assessed                                        General Comments      Exercises     Assessment/Plan    PT Assessment Patent does not need any further PT services  PT Problem List         PT Treatment Interventions      PT Goals (Current goals can be found in the Care Plan section)       Frequency     Barriers to discharge        Co-evaluation  AM-PAC PT "6 Clicks" Daily Activity  Outcome Measure Difficulty turning over in bed (including adjusting bedclothes, sheets and blankets)?: None Difficulty moving from lying on back to sitting on the side of the bed? : None Difficulty sitting down on and standing up from a chair with arms (e.g., wheelchair, bedside commode, etc,.)?: None Help needed moving to and from a bed to chair (including a wheelchair)?: None Help needed walking in hospital room?: A Little Help needed climbing 3-5 steps with a railing? : A Little 6 Click Score: 22    End of Session   Activity Tolerance: Patient tolerated treatment well Patient left: in bed;with call bell/phone within reach;with family/visitor present Nurse Communication: Mobility  status      Time: 1425-1446 PT Time Calculation (min) (ACUTE ONLY): 21 min   Charges:   PT Evaluation $PT Eval Low Complexity: 1 Procedure     PT G Codes:          Shaquila Sigman E Penven-Crew 11/10/2016, 3:31 PM

## 2016-11-11 ENCOUNTER — Other Ambulatory Visit (HOSPITAL_COMMUNITY): Payer: Medicare Other

## 2016-11-11 DIAGNOSIS — R0609 Other forms of dyspnea: Secondary | ICD-10-CM

## 2016-11-11 LAB — BASIC METABOLIC PANEL
Anion gap: 9 (ref 5–15)
BUN: 14 mg/dL (ref 6–20)
CHLORIDE: 102 mmol/L (ref 101–111)
CO2: 25 mmol/L (ref 22–32)
CREATININE: 0.65 mg/dL (ref 0.44–1.00)
Calcium: 8.7 mg/dL — ABNORMAL LOW (ref 8.9–10.3)
GFR calc Af Amer: >60 mL/min (ref >60)
GFR calc non Af Amer: >60 mL/min (ref >60)
GLUCOSE: 108 mg/dL — AB (ref 65–99)
Potassium: 3.7 mmol/L (ref 3.5–5.1)
Sodium: 136 mmol/L (ref 135–145)

## 2016-11-11 MED ORDER — DILTIAZEM HCL ER COATED BEADS 180 MG PO CP24: 180.0000 mg | ORAL_CAPSULE | Freq: Every day | ORAL | Status: DC

## 2016-11-11 MED ORDER — APIXABAN 2.5 MG PO TABS
2.5000 mg | ORAL_TABLET | Freq: Two times a day (BID) | ORAL | Status: DC
  Administered 2016-11-11: 2.5 mg via ORAL
  Filled 2016-11-11: qty 1

## 2016-11-11 MED ORDER — DILTIAZEM HCL ER COATED BEADS 180 MG PO CP24: 180.0000 mg | ORAL_CAPSULE | Freq: Every day | ORAL | 0 refills | Status: AC

## 2016-11-11 MED ORDER — PANTOPRAZOLE SODIUM 40 MG PO TBEC: 40.0000 mg | DELAYED_RELEASE_TABLET | Freq: Every day | ORAL | 0 refills | Status: DC

## 2016-11-11 MED ORDER — APIXABAN 2.5 MG PO TABS: 2.5000 mg | ORAL_TABLET | Freq: Two times a day (BID) | ORAL | 0 refills | Status: DC

## 2016-11-11 MED ORDER — DILTIAZEM HCL 60 MG PO TABS
60.0000 mg | ORAL_TABLET | Freq: Once | ORAL | Status: AC
  Administered 2016-11-11: 60 mg via ORAL
  Filled 2016-11-11: qty 1

## 2016-11-11 NOTE — Discharge Summary (Signed)
Pt got discharged, discharge instructions provided and patient showed understanding to it, IV taken out,Telemonitor DC,pt left unit in wheelchair with all of the belongings accompanied with daughter

## 2016-11-11 NOTE — Discharge Summary (Signed)
Krystal Hensley TUU:[id number] DOB: 1918/04/26 DOA: 11/09/2016  PCP: Lajean Manes, MD  Admit date: 11/09/2016  Discharge date: 11/11/2016  Admitted From: Home   Disposition:  Home   Recommendations for Outpatient Follow-up:   Follow up with PCP in 1-2 weeks  PCP Please obtain BMP/CBC, 2 view CXR in 1week,  (see Discharge instructions)   PCP Please follow up on the following pending results: None   Home Health: PT, RN, Aide   Equipment/Devices: None  Consultations: Cards Discharge Condition: Fair   CODE STATUS: DNR   Diet Recommendation: Heart Healthy with fluid restriction of 1.5 L per day   Chief Complaint  Patient presents with  . Shortness of Breath  . Atrial Fibrillation     Brief history of present illness from the day of admission and additional interim summary    Krystal Hook Wyrickis a 83 y.o.femalewith medical history significant for HTN, Chronic DHF, PAF, Polymyalgia Rheumatica, Pulmonary Hypertension, prior CVA, presenting with 4 day history of non-productive cough, increasing shortness of breath, Was admitted with acute on chronic diastolic CHF and atrial fibrillation.                                                                 Hospital Course   1. Acute on chronic diastolic heart failure with last EF around 65%. Likely caused by paroxysmal A. fib with RVR, she is improved and now back to her baseline without any oxygen need, she was given IV Lasix and now transitioned to oral, her diltiazem dose has been adjusted by cardiology, continue home dose Toprol and Lasix. TSH was stable. Per cardiology discharged home with no further workup.  2. Paroxysmal atrial fibrillation with RVR [place] vasc 2 score of 7. Cardizem dose adjusted, continue home dose beta blocker, TSH was stable, after detailed  discussion with patient and cardiology she has been placed on Eliquis low-dose along with her existing aspirin, have added PPI, she will be following with cardiology, risks and benefits were clearly explained to her by me in person and also by cardiology, she chose to take Eliquis. We'll defer the new addition of aspirin to her primary cardiologist Dr. Meda Coffee who she will follow with soon.  3. Polymyalgia rheumatica. She has chronic right upper extremity weakness which continues, continue supportive care, she is not on steroids on any treatment.  4. GERD. On PPI.   Discharge diagnosis     Active Problems:   Cerebral infarction ([place])   RUE weakness   HTN (hypertension)   PMR (polymyalgia rheumatica) (HCC)   Spastic hemiplegia affecting dominant side (HCC)   Wound of left leg   Hx of PAF- CHADS VASC=7   Pulmonary hypertension (HCC)   Atrial fibrillation with RVR (HCC)   Dyspnea   Abdominal pain, right upper quadrant  Chronic diastolic CHF (congestive heart failure) (HCC)   Acute on chronic diastolic CHF (congestive heart failure), NYHA class 2 (HCC)   Hyponatremia   Shortness of breath    Discharge instructions    Discharge Instructions    Discharge instructions    Complete by:  As directed    Follow with Primary MD Lajean Manes, MD in 7 days   Get CBC, CMP, 2 view Chest X ray checked  by Primary MD or SNF MD in 5-7 days ( we routinely change or add medications that can affect your baseline labs and fluid status, therefore we recommend that you get the mentioned basic workup next visit with your PCP, your PCP may decide not to get them or add new tests based on their clinical decision)  Activity: As tolerated with Full fall precautions use walker/cane & assistance as needed  Disposition Home    Diet:  Heart Healthy  - Check your Weight same time everyday, if you gain over 2 pounds, or you develop in leg swelling, experience more shortness of breath or chest pain, call  your Primary MD immediately. Follow Cardiac Low Salt Diet and 1.5 lit/day fluid restriction.  On your next visit with your primary care physician please Get Medicines reviewed and adjusted.  Please request your Prim.MD to go over all Hospital Tests and Procedure/Radiological results at the follow up, please get all Hospital records sent to your Prim MD by signing hospital release before you go home.  If you experience worsening of your admission symptoms, develop shortness of breath, life threatening emergency, suicidal or homicidal thoughts you must seek medical attention immediately by calling 911 or calling your MD immediately  if symptoms less severe.  You Must read complete instructions/literature along with all the possible adverse reactions/side effects for all the Medicines you take and that have been prescribed to you. Take any new Medicines after you have completely understood and accpet all the possible adverse reactions/side effects.   Do not drive, operate heavy machinery, perform activities at heights, swimming or participation in water activities or provide baby sitting services if your were admitted for syncope or siezures until you have seen by Primary MD or a Neurologist and advised to do so again.  Do not drive when taking Pain medications.    Do not take more than prescribed Pain, Sleep and Anxiety Medications  Special Instructions: If you have smoked or chewed Tobacco  in the last 2 yrs please stop smoking, stop any regular Alcohol  and or any Recreational drug use.  Wear Seat belts while driving.   Please note  You were cared for by a hospitalist during your hospital stay. If you have any questions about your discharge medications or the care you received while you were in the hospital after you are discharged, you can call the unit and asked to speak with the hospitalist on call if the hospitalist that took care of you is not available. Once you are discharged, your  primary care physician will handle any further medical issues. Please note that NO REFILLS for any discharge medications will be authorized once you are discharged, as it is imperative that you return to your primary care physician (or establish a relationship with a primary care physician if you do not have one) for your aftercare needs so that they can reassess your need for medications and monitor your lab values.   Increase activity slowly    Complete by:  As directed  Discharge Medications   Allergies as of 11/11/2016      Reactions   Chocolate Other (See Comments)   Unknown   Statins Other (See Comments)   myalgia      Medication List    STOP taking these medications   amoxicillin-clavulanate 875-125 MG tablet Commonly known as:  AUGMENTIN   cephALEXin 500 MG capsule Commonly known as:  KEFLEX   omeprazole 20 MG tablet Commonly known as:  PRILOSEC OTC     TAKE these medications   acetaminophen 500 MG tablet Commonly known as:  TYLENOL Take 1 tablet (500 mg total) by mouth every 6 (six) hours as needed. What changed:  reasons to take this   apixaban 2.5 MG Tabs tablet Commonly known as:  ELIQUIS Take 1 tablet (2.5 mg total) by mouth 2 (two) times daily.   aspirin 81 MG chewable tablet Chew 1 tablet (81 mg total) by mouth daily.   calcium-vitamin D 500-200 MG-UNIT tablet Commonly known as:  OSCAL WITH D Take 1 tablet by mouth daily with breakfast.   diclofenac 1.3 % Ptch Commonly known as:  FLECTOR Place 0.5 patches onto the skin 2 (two) times daily as needed (for pain).   diltiazem 180 MG 24 hr capsule Commonly known as:  CARDIZEM CD Take 1 capsule (180 mg total) by mouth daily. Start taking on:  11/12/2016 What changed:  See the new instructions.   docusate sodium 100 MG capsule Commonly known as:  COLACE Take 200 mg by mouth daily.   furosemide 20 MG tablet Commonly known as:  LASIX Take 1 tablet (20 mg total) by mouth daily as needed for edema.    Magnesium Oxide 200 MG Tabs Take 1 tab (200 mg total) by mouth daily x 5 days, then take 1 tablet daily as needed thereafter for muscle cramps.   metoprolol succinate 50 MG 24 hr tablet Commonly known as:  TOPROL-XL take 1 tablet by mouth once daily take with food or IMMEDIATELY FOLLOWING A MEAL   pantoprazole 40 MG tablet Commonly known as:  PROTONIX Take 1 tablet (40 mg total) by mouth daily. Start taking on:  11/12/2016   predniSONE 5 MG tablet Commonly known as:  DELTASONE Take 5 mg by mouth every morning.   sennosides-docusate sodium 8.6-50 MG tablet Commonly known as:  SENOKOT-S Take 1 tablet by mouth daily as needed for constipation.   silver sulfADIAZINE 1 % cream Commonly known as:  SILVADENE Apply 1 application topically daily.   VISION FORMULA/LUTEIN PO Take 1 tablet by mouth daily at 6 (six) AM.       Follow-up Information    Stoneking, Hal, MD. Schedule an appointment as soon as possible for a visit in 1 week(s).   Specialty:  Internal Medicine Contact information: [address] [place] [address] [phone]        Dorothy Spark, MD. Schedule an appointment as soon as possible for a visit in 1 week(s).   Specialty:  Cardiology Contact information: [place] [address] [phone]           Major procedures and Radiology Reports - PLEASE review detailed and final reports thoroughly  -          Dg Chest 2 View  Result Date: 11/10/2016 CLINICAL DATA:  Congestive heart failure EXAM: CHEST  2 VIEW COMPARISON:  Nov 09, 2016 and July 24, 2014 FINDINGS: There is underlying interstitial prominence, felt to represent underlying fibrotic type  change. There is also felt to be a degree of interstitial edema superimposed on fibrosis. There is no airspace consolidation. There is cardiomegaly with pulmonary venous hypertension. No adenopathy. There is aortic atherosclerosis. There is a moderate hiatal  hernia. Bones are diffusely osteoporotic. There is superior migration of each humeral head. There is evidence of old rib trauma on the right, healed. There is thoracolumbar levoscoliosis. No pneumothorax. IMPRESSION: Findings felt to represent a degree of congestive heart failure superimposed on underlying pulmonary fibrotic type change. There is aortic atherosclerosis. There is a moderate hiatal hernia. Bones are diffusely osteoporotic. There is superior migration of each humeral head, felt to be indicative of chronic rotator cuff tears. Electronically Signed   By: Lowella Grip III M.D.   On: 11/10/2016 07:37   Dg Chest 2 View  Result Date: 11/09/2016 CLINICAL DATA:  Shortness of breath and chest pain for 4 days. EXAM: CHEST  2 VIEW COMPARISON:  12/02/2015 and prior radiograph FINDINGS: Cardiomegaly and chronic interstitial pulmonary opacities again noted. There is no evidence of focal airspace disease, pulmonary edema, suspicious pulmonary nodule/mass, pleural effusion, or pneumothorax. No acute bony abnormalities are identified. A moderate to large hiatal hernia is again noted. IMPRESSION: No evidence of acute cardiopulmonary disease. Cardiomegaly, chronic interstitial opacities and moderate to large hiatal hernia. Electronically Signed   By: Margarette Canada M.D.   On: 11/09/2016 13:03    Micro Results     No results found for this or any previous visit (from the past 240 hour(s)).  Today   Subjective    Krystal Hensley today has no headache,no chest abdominal pain,no new weakness tingling or numbness, feels much better wants to go home today.     Objective   Blood pressure 139/75, pulse 80, temperature 97.9 F (36.6 C), temperature source Oral, resp. rate 18, height 5' (1.524 m), weight 51.5 kg (113 lb 8 oz), SpO2 100 %.   Intake/Output Summary (Last 24 hours) at 11/11/16 1057 Last data filed at 11/11/16 1020  Gross per 24 hour  Intake              120 ml  Output             1990 ml    Net            -1870 ml    Exam Awake Alert, Oriented x 3, No new F.N deficits, Normal affect [place].AT,PERRAL Supple Neck,No JVD, No cervical lymphadenopathy appriciated.  Symmetrical Chest wall movement, Good air movement bilaterally, CTAB RRR,No Gallops,Rubs or new Murmurs, No Parasternal Heave +ve B.Sounds, Abd Soft, Non tender, No organomegaly appriciated, No rebound -guarding or rigidity. No Cyanosis, Clubbing or edema, No new Rash or bruise   Data Review   CBC w Diff: Lab Results  Component Value Date   WBC 11.2 (H) 11/09/2016   HGB 13.9 11/09/2016   HCT 43.2 11/09/2016   PLT 251 11/09/2016   LYMPHOPCT 6 (L) 12/07/2014   MONOPCT 5 12/07/2014   EOSPCT 0 12/07/2014   BASOPCT 0 12/07/2014    CMP: Lab Results  Component Value Date   NA 136 11/11/2016   K 3.7 11/11/2016   CL 102 11/11/2016   CO2 25 11/11/2016   BUN 14 11/11/2016   CREATININE 0.65 11/11/2016   PROT 7.0 12/07/2014   ALBUMIN 4.0 12/07/2014   BILITOT 0.6 12/07/2014   ALKPHOS 107 12/07/2014   AST 31 12/07/2014   ALT 17 12/07/2014  .   Total Time in preparing paper  work, Microbiologist and todays exam - 15 minutes  Lala Lund M.D on 11/11/2016 at 10:57 AM  [facility]   Office  [phone]

## 2016-11-11 NOTE — Discharge Instructions (Signed)
Follow with Primary MD Lajean Manes, MD in 7 days   Get CBC, CMP, 2 view Chest X ray checked  by Primary MD or SNF MD in 5-7 days ( we routinely change or add medications that can affect your baseline labs and fluid status, therefore we recommend that you get the mentioned basic workup next visit with your PCP, your PCP may decide not to get them or add new tests based on their clinical decision)  Activity: As tolerated with Full fall precautions use walker/cane & assistance as needed  Disposition Home    Diet:  Heart Healthy  - Check your Weight same time everyday, if you gain over 2 pounds, or you develop in leg swelling, experience more shortness of breath or chest pain, call your Primary MD immediately. Follow Cardiac Low Salt Diet and 1.5 lit/day fluid restriction.  On your next visit with your primary care physician please Get Medicines reviewed and adjusted.  Please request your Prim.MD to go over all Hospital Tests and Procedure/Radiological results at the follow up, please get all Hospital records sent to your Prim MD by signing hospital release before you go home.  If you experience worsening of your admission symptoms, develop shortness of breath, life threatening emergency, suicidal or homicidal thoughts you must seek medical attention immediately by calling 911 or calling your MD immediately  if symptoms less severe.  You Must read complete instructions/literature along with all the possible adverse reactions/side effects for all the Medicines you take and that have been prescribed to you. Take any new Medicines after you have completely understood and accpet all the possible adverse reactions/side effects.   Do not drive, operate heavy machinery, perform activities at heights, swimming or participation in water activities or provide baby sitting services if your were admitted for syncope or siezures until you have seen by Primary MD or a Neurologist and advised to do so again.  Do  not drive when taking Pain medications.    Do not take more than prescribed Pain, Sleep and Anxiety Medications  Special Instructions: If you have smoked or chewed Tobacco  in the last 2 yrs please stop smoking, stop any regular Alcohol  and or any Recreational drug use.  Wear Seat belts while driving.   Please note  You were cared for by a hospitalist during your hospital stay. If you have any questions about your discharge medications or the care you received while you were in the hospital after you are discharged, you can call the unit and asked to speak with the hospitalist on call if the hospitalist that took care of you is not available. Once you are discharged, your primary care physician will handle any further medical issues. Please note that NO REFILLS for any discharge medications will be authorized once you are discharged, as it is imperative that you return to your primary care physician (or establish a relationship with a primary care physician if you do not have one) for your aftercare needs so that they can reassess your need for medications and monitor your lab values.  Information on my medicine - ELIQUIS (apixaban)  This medication education was reviewed with me or my healthcare representative as part of my discharge preparation.  The pharmacist that spoke with me during my hospital stay was:  Tad Moore, [facility]  Why was Eliquis prescribed for you? Eliquis was prescribed for you to reduce the risk of forming blood clots that can cause a stroke if you have a medical  condition called atrial fibrillation (a type of irregular heartbeat) OR to reduce the risk of a blood clots forming after orthopedic surgery.  What do You need to know about Eliquis ? Take your Eliquis TWICE DAILY - one tablet in the morning and one tablet in the evening with or without food.  It would be best to take the doses about the same time each day.  If you have difficulty swallowing the tablet  whole please discuss with your pharmacist how to take the medication safely.  Take Eliquis exactly as prescribed by your doctor and DO NOT stop taking Eliquis without talking to the doctor who prescribed the medication.  Stopping may increase your risk of developing a new clot or stroke.  Refill your prescription before you run out.  After discharge, you should have regular check-up appointments with your healthcare provider that is prescribing your Eliquis.  In the future your dose may need to be changed if your kidney function or weight changes by a significant amount or as you get older.  What do you do if you miss a dose? If you miss a dose, take it as soon as you remember on the same day and resume taking twice daily.  Do not take more than one dose of ELIQUIS at the same time.  Important Safety Information A possible side effect of Eliquis is bleeding. You should call your healthcare provider right away if you experience any of the following: ? Bleeding from an injury or your nose that does not stop. ? Unusual colored urine (red or dark brown) or unusual colored stools (red or black). ? Unusual bruising for unknown reasons. ? A serious fall or if you hit your head (even if there is no bleeding).  Some medicines may interact with Eliquis and might increase your risk of bleeding or clotting while on Eliquis. To help avoid this, consult your healthcare provider or pharmacist prior to using any new prescription or non-prescription medications, including herbals, vitamins, non-steroidal anti-inflammatory drugs (NSAIDs) and supplements.  This website has more information on Eliquis (apixaban): [email or web address].no.

## 2016-11-11 NOTE — Progress Notes (Signed)
Entered in error

## 2016-11-11 NOTE — Care Management Note (Signed)
Case Management Note  Patient Details  Name: Krystal Hensley MRN: [id number] Date of Birth: 04-Nov-1917  Action/Plan: CM talked to patient about [place] choices, patient requested [facility]/ [facility]; [name] with [facility] called for arrangements; Eliquis coupon card given to patient with instructions of usage; DME - rollater and cane at home;  Expected Discharge Date:  11/11/16               Expected Discharge Plan:  [place]  In-House Referral:   [facility]  Discharge planning Services  CM Consult  Choice offered to:  Patient  HH Arranged:  RN, PT, Nurse's Aide [place] Agency:  [place]  Status of Service:  In process, will continue to follow  Sherrilyn Rist [phone] 11/11/2016, 11:35 AM

## 2016-11-11 NOTE — Progress Notes (Signed)
RE: Benefit check  Received: Today  Message Contents  Memory Argue CMA        # 3.  S/W [place] @ [facility] PREFERRED # [id number]    ELIIQUIS 2.5 MG DAILY   COVER- YES  CO-PAY- $ 37.00  TIER- 3 DRUG  PRIOR APPROVAL- NO   PHARMACY: [facility]

## 2016-11-11 NOTE — Progress Notes (Signed)
Patient to go home on Eliquis; Benefit check in progress; Aneta Mins [phone]

## 2016-11-11 NOTE — Progress Notes (Addendum)
Patient Name: Krystal Hensley Date of Encounter: 11/11/2016  Active Problems:   Cerebral infarction ([place])   RUE weakness   HTN (hypertension)   PMR (polymyalgia rheumatica) (HCC)   Spastic hemiplegia affecting dominant side (HCC)   Wound of left leg   Hx of PAF- CHADS VASC=7   Pulmonary hypertension (HCC)   Atrial fibrillation with rapid ventricular response (HCC)   Dyspnea   Abdominal pain, right upper quadrant   Chronic diastolic CHF (congestive heart failure) (HCC)   Acute on chronic diastolic CHF (congestive heart failure), NYHA class 2 (HCC)   Hyponatremia   Shortness of breath   Length of Stay: 2  SUBJECTIVE  The patient feels better today.  CURRENT MEDS . aspirin  81 mg Oral Daily  . diltiazem  120 mg Oral Daily  . diltiazem  10 mg Intravenous Once  . docusate sodium  200 mg Oral Daily  . enoxaparin (LOVENOX) injection  30 mg Subcutaneous Q24H  . furosemide  40 mg Intravenous Daily  . metoprolol succinate  75 mg Oral BID  . pantoprazole  40 mg Oral Daily  . sodium chloride flush  3 mL Intravenous Q12H    OBJECTIVE  Vitals:   11/10/16 0443 11/10/16 1222 11/10/16 2209 11/11/16 0600  BP: (!) 130/59 (!) 132/105 (!) 141/71 139/75  Pulse: 93 93  80  Resp: 18 18 18 18   Temp: 98.7 F (37.1 C) 97.5 F (36.4 C) 97.4 F (36.3 C) 97.9 F (36.6 C)  TempSrc: Oral Oral Oral Oral  SpO2: 98% 94% 99% 100%  Weight: 111 lb 12.8 oz (50.7 kg)   113 lb 8 oz (51.5 kg)  Height:        Intake/Output Summary (Last 24 hours) at 11/11/16 1023 Last data filed at 11/11/16 1020  Gross per 24 hour  Intake              120 ml  Output             2190 ml  Net            -2070 ml   Filed Weights   11/09/16 1700 11/10/16 0443 11/11/16 0600  Weight: 113 lb 8 oz (51.5 kg) 111 lb 12.8 oz (50.7 kg) 113 lb 8 oz (51.5 kg)   PHYSICAL EXAM  General: Pleasant, NAD. Neuro: Alert and oriented X 3. Moves all extremities spontaneously. Psych: Normal affect. HEENT:  Normal  Neck:  Supple without bruits or JVD. Lungs:  Resp regular and unlabored, CTA. Heart: iRRR no s3, s4, or murmurs. Abdomen: Soft, non-tender, non-distended, BS + x 4.  Extremities: No clubbing, cyanosis or edema. DP/PT/Radials 2+ and equal bilaterally.  Accessory Clinical Findings  CBC  Recent Labs  11/09/16 1200  WBC 11.2*  HGB 13.9  HCT 43.2  MCV 96.2  PLT 161   Basic Metabolic Panel  Recent Labs  11/09/16 1200 11/10/16 0321 11/11/16 0422  NA 133* 136 136  K 4.8 4.7 3.7  CL 101 105 102  CO2 23 24 25   GLUCOSE 97 112* 108*  BUN 15 12 14   CREATININE 0.67 0.60 0.65  CALCIUM 9.0 8.7* 8.7*  MG 2.2  --   --     Recent Labs  11/09/16 1658 11/09/16 2109 11/10/16 0321  TROPONINI <0.03 <0.03 <0.03    Recent Labs  11/09/16 1223  TSH 1.171   Radiology/Studies  Dg Chest 2 View  Result Date: 11/10/2016 CLINICAL DATA:  Congestive heart failure EXAM: CHEST  2  VIEW COMPARISON:  Nov 09, 2016 and July 24, 2014 FINDINGS: There is underlying interstitial prominence, felt to represent underlying fibrotic type change. There is also felt to be a degree of interstitial edema superimposed on fibrosis. There is no airspace consolidation. There is cardiomegaly with pulmonary venous hypertension. No adenopathy. There is aortic atherosclerosis. There is a moderate hiatal hernia. Bones are diffusely osteoporotic. There is superior migration of each humeral head. There is evidence of old rib trauma on the right, healed. There is thoracolumbar levoscoliosis. No pneumothorax. IMPRESSION: Findings felt to represent a degree of congestive heart failure superimposed on underlying pulmonary fibrotic type change. There is aortic atherosclerosis. There is a moderate hiatal hernia. Bones are diffusely osteoporotic. There is superior migration of each humeral head, felt to be indicative of chronic rotator cuff tears. Electronically Signed   By: Lowella Grip III M.D.   On: 11/10/2016 07:37   Dg Chest 2  View  Result Date: 11/09/2016 CLINICAL DATA:  Shortness of breath and chest pain for 4 days. EXAM: CHEST  2 VIEW COMPARISON:  12/02/2015 and prior radiograph FINDINGS: Cardiomegaly and chronic interstitial pulmonary opacities again noted. There is no evidence of focal airspace disease, pulmonary edema, suspicious pulmonary nodule/mass, pleural effusion, or pneumothorax. No acute bony abnormalities are identified. A moderate to large hiatal hernia is again noted. IMPRESSION: No evidence of acute cardiopulmonary disease. Cardiomegaly, chronic interstitial opacities and moderate to large hiatal hernia. Electronically Signed   By: Margarette Canada M.D.   On: 11/09/2016 13:03   TELE:   TTE: Last echo 07/25/2014 - Left ventricle: The cavity size was normal. Wall thickness was increased in a pattern of mild LVH. There was mild focal basal hypertrophy of the septum. Systolic function was vigorous. The estimated ejection fraction was in the range of 65% to 70%. Wall motion was normal; there were no regional wall motion abnormalities. - Aortic valve: There was mild regurgitation. - Mitral valve: Calcified annulus. There was mild regurgitation. - Left atrium: The atrium was moderately dilated. - Pulmonary arteries: Systolic pressure was mildly increased.  Impressions: - Normal LV function; mild LVH; moderate LAE; mild MR, AI and TR; mildly elevated pulmonary pressure.  Telemetry: a-fib rate controlled, personally reviewed    ASSESSMENT AND PLAN  Paroxysmal atrial fibrillation -This is known and has been controlled in the past with beta blocker -Currently with rates in the 90's, I will increase cardizem CD to 180 mg po daily -CHA2DS2/VAS Stroke Risk Score is 7 (CHF, HTN, Age (2), stroke (2), female).  She was started on Eliquis 2.5 mg po daily, she is instructed to call us if she falls or has balance issues, please discontinue aspirin -Continue Toprol XL 50 mg daily  - We will arrange for  an outpatient follow up in 7-10 days  Acute on chronic diastolic heart failure -Likely worsened in setting of atrial fib with RVR.  -Diuresed well with Lasix 40 mg IV, negative 2 L -Weight 113-->111. Euvolemic on physical exam - Continue PO lasix PRN at home  The patient can be discharged home today, please arrange walking with physical therapy before discharge.    Signed, Ena Dawley MD, [facility] 11/11/2016

## 2016-11-18 ENCOUNTER — Ambulatory Visit: Payer: Medicare Other | Admitting: Podiatry

## 2016-11-19 ENCOUNTER — Other Ambulatory Visit: Payer: Self-pay

## 2016-11-19 NOTE — Patient Outreach (Signed)
[place] [facility]) Care Management  11/19/2016  Krystal Hensley Apr 17, 1918 [id number]     EMMI-HF RED ON EMMI ALERT Day # 6 Date: 11/18/16 Red Alert Reason: "Sad,hopeless or empty? Yes"  "Lost interest in things they used to enjoy? Yes"     Outreach attempt # 1 to patient. No answer at present and no voicemail box.         Plan: RN CM will make outreach attempt to patient within one business day.   [name], RN,BSN,CCM [place] Management Telephonic Care Management Coordinator Direct Phone: [phone] Toll Free: [phone] Fax: [phone]

## 2016-11-20 ENCOUNTER — Other Ambulatory Visit: Payer: Self-pay

## 2016-11-20 NOTE — Patient Outreach (Signed)
[place] [facility]) Care Management  11/20/2016  MACALL MCCROSKEY 1917/09/21 [id number]   Patient triggered Red on EMMI heart failure dashboard, notification sent to [name], RN

## 2016-11-20 NOTE — Patient Outreach (Signed)
[place] [facility]) Care Management  11/20/2016  Krystal Hensley 10-31-1917 [id number]   EMMI-HF RED ON EMMI ALERT Day # 6 Date: 11/18/16 Red Alert Reason: "Sad,hopeless or empty? Yes"  "Lost interest in things they used to enjoy? Yes" Day # 7 Date: 11/19/16 Red Alert Reason: "New/worsening problems? Yes"  "New/worsening shortness of breath? Yes"   Outreach attempt #2 to patient.  Patient requested call be completed with dtr-Charlene. ROI on file. Spoke with dtr reviewed and addressed red alerts. Dtr states she has already contacted MD office this morning and is awaiting a call back to discuss patient's condition. She voices that patient gained two pounds from yesterday according to weight this morning. Patient has been doing some "abdominal breathing." dtr reports that they were just in to see PCP on 11/18/16 and MD had advised them that patient still was retaining fluid. Her Lasix was changed from prn to daily to help manage symptoms. Patient has been having some SOB with rest and exertion. Dtr does voice that patient is adhering to low sodium diet and 1.5ltr fluid restriction. She is waiting to hear back from PCP office regarding what she should do. Dtr knowledgeable regarding mgmt of HF. Dtr states that patient was to get [facility] services but to her knowledge no one has contacted and/or visited the patient. Per inpatient CM note patient opted for [place] and agency contacted by inpatient CM. Provided dtr with [facility] agency phone number to f/u with them. Also, advised patient to alert/ discuss this with MD office when they call her back. She voiced understanding. Dtr denies any further RN CM needs or concerns at this time. Advised dtr that they would continue to get automated EMMI- HF post discharge calls to assess how they are doing following recent hospitalization and will receive a call from a nurse if any of their responses were abnormal. Dtr voiced understanding and was appreciative of  f/u call.       Plan: RN CM will notify [facility] administrative assistant of case status. RN CM will send EMMI HF educational info to patient/dtr for review.   [name], RN,BSN,CCM [place] Management Telephonic Care Management Coordinator Direct Phone: [phone] Toll Free: [phone] Fax: [phone]

## 2016-11-21 ENCOUNTER — Other Ambulatory Visit: Payer: Self-pay

## 2016-11-21 NOTE — Patient Outreach (Addendum)
[place] [facility]) Care Management  11/21/2016  Krystal Hensley 07/02/1918 [id number]     EMMI-CHF RED ON EMMI ALERT Day # 8 Date: 11/20/16 Red Alert Reason: "Weight? 110 lbs" New/worsening problems? Yes" "New swelling? Yes" "New/worsening SOB? Yes"   No outreach attempt warranted to patient at this time. RN CM spoke with patient/dtr on yesterday regarding these red alerts and provided recommendation for sx mgmt. Dtr already contacted PCP office to alert of symptoms.      Plan: RN CM will notify [facility] administrative assistant of case status.    [name], RN,BSN,CCM [place] Management Telephonic Care Management Coordinator Direct Phone: [phone] Toll Free: [phone] Fax: [phone]

## 2016-11-25 ENCOUNTER — Other Ambulatory Visit: Payer: Self-pay

## 2016-11-25 NOTE — Patient Outreach (Signed)
[place] [facility]) Care Management  11/25/2016  Krystal Hensley Mar 22, 1918 [id number]     EMMI-HF Program   Voicemail message received from dtr-Charlene Tamala Julian requesting RN CM return call. ROI on file for dtr and return call placed to dtr. Dtr reports that they received HF educational information mailed to them by RN CM. She just wanted to say how helpful and informative the information was for them. Dtr states that she as well as the patient really liked the HF Zones magnet and will be using this to gauge how patient is doing and when to seek medical attention. Dtr inquiring if regarding the duration of EMMI-HF automated calls program. Advised that patient is getting automated calls daily for 45 days post discharge. Dtr reports that her brother was with patient over the weekend and for the past several days. She has advised everyone to answer the call when the number comes up on caller ID. RN CM thanked and encouraged dtr to continue with these good habits. She voices that patient has f/u PCP appt this week. No other RN CM needs or concerns at this time.    Plan: RN CM will continue to monitor EMMI HF dashboard for any red alert responses from patient.    [name], RN,BSN,CCM [place] Management Telephonic Care Management Coordinator Direct Phone: [phone] Toll Free: [phone] Fax: [phone]

## 2016-11-27 ENCOUNTER — Other Ambulatory Visit: Payer: Self-pay

## 2016-11-27 ENCOUNTER — Ambulatory Visit: Payer: Medicare Other | Admitting: Physician Assistant

## 2016-11-27 NOTE — Patient Outreach (Signed)
[facility]) Care Management  11/27/2016  Krystal Hensley 1918-01-31 [id number]     EMMI-HF RED ON EMMI ALERT Day # 14 Date: 11/26/16 Red Alert Reason: "New/worsening problems? Yes"    Outreach attempt #  1 to patient. Spoke with patient. States she is doing okay and eating lunch at present. Addressed and reviewed red alert with patient. Patient states that she saw PCP this week. She was told that she still has ome "fluid buildup.' MD increased her diuretics. She voices that she has ben having to go to the bathroom a lot more. Advised patient that this is to be expected. She reports that she responded to automated call that way as she has been having some issues with constipation. She spoke with PCP regarding this and was advised to take Miralax QAM and then take Senokot QHS. patient states dtr got med filled yesterday but she has not taken pill. She did take Miralax this am. Encouraged patient to take med as instructed. Also, provided patient with non-pharmacological measures to help with issue including increasing fiber intake. She voiced understanding. She states that MD has ordered [facility] and someone from agency has contacted her to schedule HV. Advised patient that they would continue to get automated EMMI- HF post discharge calls to assess how they are doing following recent hospitalization and will receive a call from a nurse if any of their responses were abnormal. Patient voiced understanding and was appreciative of f/u call.        Plan: RN CM will notify [facility] administrative assistant of case status.   [name], RN,BSN,CCM [place] Management Telephonic Care Management Coordinator Direct Phone: [phone] Toll Free: [phone] Fax: [phone]

## 2016-12-02 ENCOUNTER — Other Ambulatory Visit: Payer: Self-pay

## 2016-12-02 NOTE — Patient Outreach (Addendum)
[place] [facility]) Care Management  12/02/2016  AMERE IOTT 10-16-1917 [id number]     EMMI-CHF RED ON EMMI ALERT Day # 17 Date: 11/29/16 Red Alert Reason: "Any new problems? Yes" "Any new/worsening problems? Yes" "Tired/fatigued? Yes" Day # 18 Date: 11/30/16 "Any new problems? Yes" "New/worsening problems? Yes" "Tired/fatigued? Yes" "Other symptoms/problems? Yes"   Outreach attempt # 1 to patient. Spoke with patient. Reviewed and addressed red alerts. Patient states that she was taking the two fluid pills as directed by MD but was going to the bathroom a whole lot. She called and asked MD if she could decrease pill to once a day as going that often was making her feel "wore out." MD did advise that it was okay to decrease to one pill and to call their office if she had weight gain of two pounds in one day. Patient states weight has been stable. She reports she did get anxious and nervous over the weekend as [facility] RN was supposed to show up but never did. She called [place] agency office and was told that nurse was out sick and they had forgotten to notify patient. She states that PT and RN have both already been out toady to see her which has decreased her anxiety. She denies any further RN CM needs or concerns at this time. Advised patient that they would continue to get automated EMMI- HF post discharge calls to assess how they are doing following recent hospitalization and will receive a call from a nurse if any of their responses were abnormal. Patient voiced understanding and was appreciative of f/u call.       Plan: RN CM will notify [facility] administrative assistant of case status.    [name], RN,BSN,CCM [place] Management Telephonic Care Management Coordinator Direct Phone: [phone] Toll Free: [phone] Fax: [phone]

## 2016-12-03 ENCOUNTER — Other Ambulatory Visit: Payer: Self-pay

## 2016-12-03 NOTE — Patient Outreach (Signed)
[place] [facility]) Care Management  12/03/2016  Krystal Hensley 1917/08/26 [id number]       EMMI-HF RED ON EMMI ALERT Day # 20 Date: 12/02/16 Red Alert Reason: "Lost interest in things they used to enjoy? Yes"   Red on EMMI dashboard alert received. RN CM spoke with patient on yesterday for other red alerts. Patient verbalized during call not feeling herself and doing the normal things as she was getting wiped out from the taking the diuretics. She had already contacted MD and was advised to only take one diuretic to improve her symptoms.  No outreach call warranted to patient at this time.      Plan: RN CM will notify [facility] administrative assistant of case status.    [name], RN,BSN,CCM [place] Management Telephonic Care Management Coordinator Direct Phone: [phone] Toll Free: [phone] Fax: [phone]

## 2016-12-04 ENCOUNTER — Encounter: Payer: Self-pay | Admitting: Physician Assistant

## 2016-12-04 ENCOUNTER — Ambulatory Visit (INDEPENDENT_AMBULATORY_CARE_PROVIDER_SITE_OTHER): Payer: Medicare Other | Admitting: Physician Assistant

## 2016-12-04 VITALS — BP 114/58 | HR 79 | Ht 60.0 in | Wt 108.0 lb

## 2016-12-04 DIAGNOSIS — I10 Essential (primary) hypertension: Secondary | ICD-10-CM

## 2016-12-04 DIAGNOSIS — I5032 Chronic diastolic (congestive) heart failure: Secondary | ICD-10-CM

## 2016-12-04 DIAGNOSIS — I480 Paroxysmal atrial fibrillation: Secondary | ICD-10-CM

## 2016-12-04 NOTE — Progress Notes (Signed)
Cardiology Office Note    Date:  12/04/2016   ID:  Krystal Hensley, DOB [birth year], MRN [id number]  PCP:  Lajean Manes, MD  Cardiologist: Dr. Meda Coffee  Chief Complaint  Patient presents with  . Follow-up    post hospital    History of Present Illness:  Krystal Hensley is a 83 y.o. female with history of paroxysmal atrial fibrillation CHADSVASC=7 not on anticoagulation due to fall risk advanced age and patient choice, admitted with acute on chronic diastolic CHF and rapid A. fib 11/09/16. She was treated with IV Lasix and Toprol was increased to 75 mg twice a day. The patient has not fallen in over a year and after a long detailed discussion with Dr. Johnsie Cancel she was placed on Eliquis 2.5 mg twice a day.    She also has history of CVA, hypertension, pulmonary hypertension, PMR.  She comes in today accompanied by her daughter. She decided she didn't want take the Eliquis because she bleeds too easily. She is taking the aspirin. She said she was urinating too much soap cut her Lasix back to 20 mg once a day. She is weighing daily and has not gained 2 or 3 pounds overnight. She is watching her sodium intake. She says she becomes very anxious when home health doesn't come to visit once in [place] to control her anxiety. She saw Dr. Felipa Eth last week and he did blood work and she was told her potassium and kidneys were fine. She continues to live alone but her family is checking on her regularly. She denies chest pain, palpitations, dyspnea, dyspnea on exertion, dizziness, or edema.    Past Medical History:  Diagnosis Date  . Arthritis   . Back pain   . Chronic diastolic CHF (congestive heart failure) ([place])   . Colon cancer ([place])   . HTN (hypertension) 08/08/2013  . Hypertension   . Hypoglycemia   . PAF (paroxysmal atrial fibrillation) ([place])   . Polymyalgia rheumatica ([place])   . Pulmonary hypertension ([place])    a. Echo 07/2014 - mildly increased PASP.  [facility])     Past Surgical  History:  Procedure Laterality Date  . BILATERAL OOPHORECTOMY    . CESAREAN SECTION    . COLON SURGERY    . EYE SURGERY      Current Medications: Outpatient Medications Prior to Visit  Medication Sig Dispense Refill  . acetaminophen (TYLENOL) 500 MG tablet Take 1 tablet (500 mg total) by mouth every 6 (six) hours as needed. (Patient taking differently: Take 500 mg by mouth every 6 (six) hours as needed for moderate pain. ) 30 tablet 0  . calcium-vitamin D (OSCAL WITH D) 500-200 MG-UNIT per tablet Take 1 tablet by mouth daily with breakfast.    . diclofenac (FLECTOR) 1.3 % PTCH Place 0.5 patches onto the skin 2 (two) times daily as needed (for pain).     [facility] Kitchen diltiazem (CARDIZEM CD) 180 MG 24 hr capsule Take 1 capsule (180 mg total) by mouth daily. 30 capsule 0  . docusate sodium (COLACE) 100 MG capsule Take 200 mg by mouth daily.     . metoprolol succinate (TOPROL-XL) 50 MG 24 hr tablet take 1 tablet by mouth once daily take with food or IMMEDIATELY FOLLOWING A MEAL 90 tablet 2  . Multiple Vitamins-Minerals (VISION FORMULA/LUTEIN PO) Take 1 tablet by mouth daily at 6 (six) AM.    . sennosides-docusate sodium (SENOKOT-S) 8.6-50 MG tablet Take 1 tablet by mouth daily as needed  for constipation.     . silver sulfADIAZINE (SILVADENE) 1 % cream Apply 1 application topically daily. 50 g 0  . apixaban (ELIQUIS) 2.5 MG TABS tablet Take 1 tablet (2.5 mg total) by mouth 2 (two) times daily. 60 tablet 0  . furosemide (LASIX) 20 MG tablet Take 1 tablet (20 mg total) by mouth daily as needed for edema. (Patient not taking: Reported on 11/09/2016) 90 tablet 3  . Magnesium Oxide 200 MG TABS Take 1 tab (200 mg total) by mouth daily x 5 days, then take 1 tablet daily as needed thereafter for muscle cramps. (Patient not taking: Reported on 11/09/2016) 30 tablet 0  . pantoprazole (PROTONIX) 40 MG tablet Take 1 tablet (40 mg total) by mouth daily. 30 tablet 0  . predniSONE (DELTASONE) 5 MG tablet Take 5 mg by mouth  every morning.      No facility-administered medications prior to visit.      Allergies:   Chocolate and Statins   Social History   Social History  . Marital status: Widowed    Spouse name: N/A  . Number of children: N/A  . Years of education: N/A   Social History Main Topics  . Smoking status: Never Smoker  . Smokeless tobacco: Never Used  . Alcohol use No  . Drug use: No  . Sexual activity: No   Other Topics Concern  . None   Social History Narrative  . None     Family History:  The patient's   family history includes Aneurysm in her mother; Heart attack in her father.   ROS:   Please see the history of present illness.    Review of Systems  Constitution: Negative.  HENT: Negative.   Eyes: Negative.   Cardiovascular: Negative.   Respiratory: Negative.   Hematologic/Lymphatic: Negative.   Musculoskeletal: Negative.  Negative for joint pain.  Gastrointestinal: Negative.   Genitourinary: Negative.   Neurological: Negative.    All other systems reviewed and are negative.   PHYSICAL EXAM:   VS:  BP (!) 114/58   Pulse 79   Ht 5' (1.524 m)   Wt 108 lb (49 kg)   BMI 21.09 kg/m   Physical Exam  GEN: Well nourished, well developed, in no acute distress  Neck: no JVD, carotid bruits, or masses Cardiac:RRR; no murmurs, rubs, or gallops  Respiratory:  clear to auscultation bilaterally, normal work of breathing GI: soft, nontender, nondistended, + BS Ext: Brawny changes with only trace of right ankle edema, Good distal pulses bilaterally Neuro:  Alert and Oriented x 3 Psych: euthymic mood, full affect  Wt Readings from Last 3 Encounters:  12/04/16 108 lb (49 kg)  11/11/16 113 lb 8 oz (51.5 kg)  03/26/16 113 lb (51.3 kg)      Studies/Labs Reviewed:   EKG:  EKG is not ordered today.    Recent Labs: 11/09/2016: B Natriuretic Peptide 502.7; Hemoglobin 13.9; Magnesium 2.2; Platelets 251; TSH 1.171 11/11/2016: BUN 14; Creatinine, Ser 0.65; Potassium 3.7; Sodium  136   Lipid Panel    Component Value Date/Time   CHOL 216 (H) 08/09/2013 0326   TRIG 116 08/09/2013 0326   HDL 73 08/09/2013 0326   CHOLHDL 3.0 08/09/2013 0326   VLDL 23 08/09/2013 0326   LDLCALC 120 (H) 08/09/2013 0326    Additional studies/ records that were reviewed today include:    TTE: Last echo 07/25/2014 - Left ventricle: The cavity size was normal. Wall thickness was   increased in a pattern  of mild LVH. There was mild focal basal   hypertrophy of the septum. Systolic function was vigorous. The   estimated ejection fraction was in the range of 65% to 70%. Wall   motion was normal; there were no regional wall motion   abnormalities. - Aortic valve: There was mild regurgitation. - Mitral valve: Calcified annulus. There was mild regurgitation. - Left atrium: The atrium was moderately dilated. - Pulmonary arteries: Systolic pressure was mildly increased.  Impressions: - Normal LV function; mild LVH; moderate LAE; mild MR, AI and TR;   mildly elevated pulmonary pressure.   Telemetry: a-fib rate controlled, personally reviewed        ASSESSMENT:    1. Chronic diastolic CHF (congestive heart failure) ([place])   2. Hx of PAF- CHADS VASC=7   3. Essential hypertension      PLAN:  In order of problems listed above:  Chronic diastolic CHF compensated on lower dose Lasix. Can continue this dose and take an extra 20 mg when necessary for weight gain of 2 or 3 pounds overnight. 2 g sodium diet given.  PAF CHADSVASC=7 heart rate is regular today. Patient has declined Eliquis at this time and is on ASA 81 mg daily. Reiterated stroke risk.   Hypertension blood pressure controlled  Medication Adjustments/Labs and Tests Ordered: Current medicines are reviewed at length with the patient today.  Concerns regarding medicines are outlined above.  Medication changes, Labs and Tests ordered today are listed in the Patient Instructions below. Patient Instructions  Medication  Instructions:  Your physician recommends that you continue on your current medications as directed. Please refer to the Current Medication list given to you today.   Labwork: NONE ORDERED  Testing/Procedures: NONE ORDERED  Follow-Up: DR. Meda Coffee 3-4 MONTH; OUR OFFICE WILL SEND OUT A REMINDER LETTER TO HAVE YOU CALL AND MAKE AN APPOINTMENT  Any Other Special Instructions Will Be Listed Below (If Applicable). If you need a refill on your cardiac medications before your next appointment, please call your pharmacy.   Low-Sodium Eating Plan Sodium, which is an element that makes up salt, helps you maintain a healthy balance of fluids in your body. Too much sodium can increase your blood pressure and cause fluid and waste to be held in your body. Your health care provider or dietitian may recommend following this plan if you have high blood pressure (hypertension), kidney disease, liver disease, or heart failure. Eating less sodium can help lower your blood pressure, reduce swelling, and protect your heart, liver, and kidneys. What are tips for following this plan? General guidelines  Most people on this plan should limit their sodium intake to 1,500-2,000 mg (milligrams) of sodium each day. Reading food labels  The Nutrition Facts label lists the amount of sodium in one serving of the food. If you eat more than one serving, you must multiply the listed amount of sodium by the number of servings.  Choose foods with less than 140 mg of sodium per serving.  Avoid foods with 300 mg of sodium or more per serving. Shopping  Look for lower-sodium products, often labeled as "low-sodium" or "no salt added."  Always check the sodium content even if foods are labeled as "unsalted" or "no salt added".  Buy fresh foods. ? Avoid canned foods and premade or frozen meals. ? Avoid canned, cured, or processed meats  Buy breads that have less than 80 mg of sodium per slice. Cooking  Eat more  home-cooked food and less restaurant, buffet, and  fast food.  Avoid adding salt when cooking. Use salt-free seasonings or herbs instead of table salt or sea salt. Check with your health care provider or pharmacist before using salt substitutes.  Cook with plant-based oils, such as canola, sunflower, or olive oil. Meal planning  When eating at a restaurant, ask that your food be prepared with less salt or no salt, if possible.  Avoid foods that contain MSG (monosodium glutamate). MSG is sometimes added to [place] food, bouillon, and some canned foods. What foods are recommended? The items listed may not be a complete list. Talk with your dietitian about what dietary choices are best for you. Grains Low-sodium cereals, including oats, puffed wheat and rice, and shredded wheat. Low-sodium crackers. Unsalted rice. Unsalted pasta. Low-sodium bread. Whole-grain breads and whole-grain pasta. Vegetables Fresh or frozen vegetables. "No salt added" canned vegetables. "No salt added" tomato sauce and paste. Low-sodium or reduced-sodium tomato and vegetable juice. Fruits Fresh, frozen, or canned fruit. Fruit juice. Meats and other protein foods Fresh or frozen (no salt added) meat, poultry, seafood, and fish. Low-sodium canned tuna and salmon. Unsalted nuts. Dried peas, beans, and lentils without added salt. Unsalted canned beans. Eggs. Unsalted nut butters. Dairy Milk. Soy milk. Cheese that is naturally low in sodium, such as ricotta cheese, fresh mozzarella, or Swiss cheese Low-sodium or reduced-sodium cheese. Cream cheese. Yogurt. Fats and oils Unsalted butter. Unsalted margarine with no trans fat. Vegetable oils such as canola or olive oils. Seasonings and other foods Fresh and dried herbs and spices. Salt-free seasonings. Low-sodium mustard and ketchup. Sodium-free salad dressing. Sodium-free light mayonnaise. Fresh or refrigerated horseradish. Lemon juice. Vinegar. Homemade, reduced-sodium, or  low-sodium soups. Unsalted popcorn and pretzels. Low-salt or salt-free chips. What foods are not recommended? The items listed may not be a complete list. Talk with your dietitian about what dietary choices are best for you. Grains Instant hot cereals. Bread stuffing, pancake, and biscuit mixes. Croutons. Seasoned rice or pasta mixes. Noodle soup cups. Boxed or frozen macaroni and cheese. Regular salted crackers. Self-rising flour. Vegetables Sauerkraut, pickled vegetables, and relishes. Olives. [place] fries. Onion rings. Regular canned vegetables (not low-sodium or reduced-sodium). Regular canned tomato sauce and paste (not low-sodium or reduced-sodium). Regular tomato and vegetable juice (not low-sodium or reduced-sodium). Frozen vegetables in sauces. Meats and other protein foods Meat or fish that is salted, canned, smoked, spiced, or pickled. Bacon, ham, sausage, hotdogs, corned beef, chipped beef, packaged lunch meats, salt pork, jerky, pickled herring, anchovies, regular canned tuna, sardines, salted nuts. Dairy Processed cheese and cheese spreads. Cheese curds. Blue cheese. Feta cheese. String cheese. Regular cottage cheese. Buttermilk. Canned milk. Fats and oils Salted butter. Regular margarine. Ghee. Bacon fat. Seasonings and other foods Onion salt, garlic salt, seasoned salt, table salt, and sea salt. Canned and packaged gravies. Worcestershire sauce. Tartar sauce. Barbecue sauce. Teriyaki sauce. Soy sauce, including reduced-sodium. Steak sauce. Fish sauce. Oyster sauce. Cocktail sauce. Horseradish that you find on the shelf. Regular ketchup and mustard. Meat flavorings and tenderizers. Bouillon cubes. Hot sauce and Tabasco sauce. Premade or packaged marinades. Premade or packaged taco seasonings. Relishes. Regular salad dressings. Salsa. Potato and tortilla chips. Corn chips and puffs. Salted popcorn and pretzels. Canned or dried soups. Pizza. Frozen entrees and pot pies. Summary  Eating  less sodium can help lower your blood pressure, reduce swelling, and protect your heart, liver, and kidneys.  Most people on this plan should limit their sodium intake to 1,500-2,000 mg (milligrams) of sodium each day.  Canned,  boxed, and frozen foods are high in sodium. Restaurant foods, fast foods, and pizza are also very high in sodium. You also get sodium by adding salt to food.  Try to cook at home, eat more fresh fruits and vegetables, and eat less fast food, canned, processed, or prepared foods. This information is not intended to replace advice given to you by your health care provider. Make sure you discuss any questions you have with your health care provider. Document Released: 12/13/2001 Document Revised: 06/16/2016 Document Reviewed: 06/16/2016 Elsevier Interactive Patient Education  2017 [place], Ermalinda Barrios, [place]  12/04/2016 1:26 PM    [facility] [address], [place], [place]  [address] Phone: [phone]; Fax: [phone]

## 2016-12-04 NOTE — Patient Instructions (Addendum)
Medication Instructions:  Your physician recommends that you continue on your current medications as directed. Please refer to the Current Medication list given to you today.   Labwork: NONE ORDERED  Testing/Procedures: NONE ORDERED  Follow-Up: DR. Meda Coffee 3-4 MONTH; OUR OFFICE WILL SEND OUT A REMINDER LETTER TO HAVE YOU CALL AND MAKE AN APPOINTMENT  Any Other Special Instructions Will Be Listed Below (If Applicable). If you need a refill on your cardiac medications before your next appointment, please call your pharmacy.   Low-Sodium Eating Plan Sodium, which is an element that makes up salt, helps you maintain a healthy balance of fluids in your body. Too much sodium can increase your blood pressure and cause fluid and waste to be held in your body. Your health care provider or dietitian may recommend following this plan if you have high blood pressure (hypertension), kidney disease, liver disease, or heart failure. Eating less sodium can help lower your blood pressure, reduce swelling, and protect your heart, liver, and kidneys. What are tips for following this plan? General guidelines  Most people on this plan should limit their sodium intake to 1,500-2,000 mg (milligrams) of sodium each day. Reading food labels  The Nutrition Facts label lists the amount of sodium in one serving of the food. If you eat more than one serving, you must multiply the listed amount of sodium by the number of servings.  Choose foods with less than 140 mg of sodium per serving.  Avoid foods with 300 mg of sodium or more per serving. Shopping  Look for lower-sodium products, often labeled as "low-sodium" or "no salt added."  Always check the sodium content even if foods are labeled as "unsalted" or "no salt added".  Buy fresh foods. ? Avoid canned foods and premade or frozen meals. ? Avoid canned, cured, or processed meats  Buy breads that have less than 80 mg of sodium per slice. Cooking  Eat  more home-cooked food and less restaurant, buffet, and fast food.  Avoid adding salt when cooking. Use salt-free seasonings or herbs instead of table salt or sea salt. Check with your health care provider or pharmacist before using salt substitutes.  Cook with plant-based oils, such as canola, sunflower, or olive oil. Meal planning  When eating at a restaurant, ask that your food be prepared with less salt or no salt, if possible.  Avoid foods that contain MSG (monosodium glutamate). MSG is sometimes added to [place] food, bouillon, and some canned foods. What foods are recommended? The items listed may not be a complete list. Talk with your dietitian about what dietary choices are best for you. Grains Low-sodium cereals, including oats, puffed wheat and rice, and shredded wheat. Low-sodium crackers. Unsalted rice. Unsalted pasta. Low-sodium bread. Whole-grain breads and whole-grain pasta. Vegetables Fresh or frozen vegetables. "No salt added" canned vegetables. "No salt added" tomato sauce and paste. Low-sodium or reduced-sodium tomato and vegetable juice. Fruits Fresh, frozen, or canned fruit. Fruit juice. Meats and other protein foods Fresh or frozen (no salt added) meat, poultry, seafood, and fish. Low-sodium canned tuna and salmon. Unsalted nuts. Dried peas, beans, and lentils without added salt. Unsalted canned beans. Eggs. Unsalted nut butters. Dairy Milk. Soy milk. Cheese that is naturally low in sodium, such as ricotta cheese, fresh mozzarella, or Swiss cheese Low-sodium or reduced-sodium cheese. Cream cheese. Yogurt. Fats and oils Unsalted butter. Unsalted margarine with no trans fat. Vegetable oils such as canola or olive oils. Seasonings and other foods Fresh and dried herbs and  spices. Salt-free seasonings. Low-sodium mustard and ketchup. Sodium-free salad dressing. Sodium-free light mayonnaise. Fresh or refrigerated horseradish. Lemon juice. Vinegar. Homemade, reduced-sodium,  or low-sodium soups. Unsalted popcorn and pretzels. Low-salt or salt-free chips. What foods are not recommended? The items listed may not be a complete list. Talk with your dietitian about what dietary choices are best for you. Grains Instant hot cereals. Bread stuffing, pancake, and biscuit mixes. Croutons. Seasoned rice or pasta mixes. Noodle soup cups. Boxed or frozen macaroni and cheese. Regular salted crackers. Self-rising flour. Vegetables Sauerkraut, pickled vegetables, and relishes. Olives. [place] fries. Onion rings. Regular canned vegetables (not low-sodium or reduced-sodium). Regular canned tomato sauce and paste (not low-sodium or reduced-sodium). Regular tomato and vegetable juice (not low-sodium or reduced-sodium). Frozen vegetables in sauces. Meats and other protein foods Meat or fish that is salted, canned, smoked, spiced, or pickled. Bacon, ham, sausage, hotdogs, corned beef, chipped beef, packaged lunch meats, salt pork, jerky, pickled herring, anchovies, regular canned tuna, sardines, salted nuts. Dairy Processed cheese and cheese spreads. Cheese curds. Blue cheese. Feta cheese. String cheese. Regular cottage cheese. Buttermilk. Canned milk. Fats and oils Salted butter. Regular margarine. Ghee. Bacon fat. Seasonings and other foods Onion salt, garlic salt, seasoned salt, table salt, and sea salt. Canned and packaged gravies. Worcestershire sauce. Tartar sauce. Barbecue sauce. Teriyaki sauce. Soy sauce, including reduced-sodium. Steak sauce. Fish sauce. Oyster sauce. Cocktail sauce. Horseradish that you find on the shelf. Regular ketchup and mustard. Meat flavorings and tenderizers. Bouillon cubes. Hot sauce and Tabasco sauce. Premade or packaged marinades. Premade or packaged taco seasonings. Relishes. Regular salad dressings. Salsa. Potato and tortilla chips. Corn chips and puffs. Salted popcorn and pretzels. Canned or dried soups. Pizza. Frozen entrees and pot  pies. Summary  Eating less sodium can help lower your blood pressure, reduce swelling, and protect your heart, liver, and kidneys.  Most people on this plan should limit their sodium intake to 1,500-2,000 mg (milligrams) of sodium each day.  Canned, boxed, and frozen foods are high in sodium. Restaurant foods, fast foods, and pizza are also very high in sodium. You also get sodium by adding salt to food.  Try to cook at home, eat more fresh fruits and vegetables, and eat less fast food, canned, processed, or prepared foods. This information is not intended to replace advice given to you by your health care provider. Make sure you discuss any questions you have with your health care provider. Document Released: 12/13/2001 Document Revised: 06/16/2016 Document Reviewed: 06/16/2016 Elsevier Interactive Patient Education  2017 [employer].

## 2016-12-08 ENCOUNTER — Other Ambulatory Visit: Payer: Self-pay

## 2016-12-08 NOTE — Patient Outreach (Signed)
[facility]) Care Management  12/08/2016  NAVY ROTHSCHILD October 04, 1917 [id number]     EMMI-HF RED ON EMMI ALERT Day # 24 Date: 12/06/16 Red Alert Reason: "Any new problems? Yes" "New/worsening problems? Yes" "Tired/fatigued? Yes"    Outreach attempt #  1 to patient. Spoke with patient who reported HH was in the home and requested a call back in a few minutes.        Plan: RN CM will make outreach attempt to patient later today.   [name], RN,BSN,CCM [place] [facility] Telephonic Care Management Coordinator Direct Phone: [phone] Toll Free: [phone] Fax: [phone]

## 2016-12-08 NOTE — Patient Outreach (Signed)
[facility]) Care Management  12/08/2016  Krystal Hensley [birth year] [id number]   EMMI-HF RED ON EMMI ALERT Day # 24 Date: 12/06/16 Red Alert Reason: "Any new problems? Yes" "New/worsening problems? Yes" "Tired/fatigued? Yes"   Outreach attempt back to patient per patient request. Spoke with patient. Reviewed and addressed red alerts. Patient states she is concerned about her weight. She reports weight down to 99.9 lbs over the weekend. Patient states weight has gone down from 109 lbs to 99.9 lbs. She is concerned about losing too much weight. Advised patient that this was probably water weight related to taking diuretic. Patient taking Lasix 20 mg daily. She reports that she mentioned to HHPT about this. therapist recommended that patient discuss this with [facility] during visit tomorrow. Patient states she wants to talk to [facility] and MD about stopping Lasix as it is still "wiping" her out and causing her to feel tired/fatigued. She denies any SOB. States that swelling to her ankles have decreased and she ash been told by [facility] when they check BP that it is good. She will discuss all this with RN on tomorrow as no urgent issues noted at this time. She denies any further RN CM needs or concerns. [place] aide arrives for visit during this call. Advised patient that they would continue to get automated EMMI- HF post discharge calls to assess how they are doing following recent hospitalization and will receive a call from a nurse if any of their responses were abnormal. Patient voiced understanding and was appreciative of f/u call.    Plan: RN CM will notify [facility] administrative assistant of case status.   [name], RN,BSN,CCM [place] [facility] Telephonic Care Management Coordinator Direct Phone: [phone] Toll Free: [phone] Fax: [phone]

## 2016-12-10 ENCOUNTER — Other Ambulatory Visit: Payer: Self-pay

## 2016-12-10 NOTE — Patient Outreach (Signed)
[facility]) Care Management  12/10/2016  Krystal Hensley Sep 14, 1917 [id number]      EMMI- CHF RED ON EMMI ALERT Day # 27 Date: 12/09/16 Red Alert Reason: "Lost interest in things they used to enjoy? Yes"   Outreach attempt # 1 to patient. Spoke with patient. She reports she was doing fine. Reviewed and addressed red alert. Patient denies having lost interest in things. She states that she has started back knitting which is a good stress reliever for her. Patient states that she is not depressed but having issues with getting anxious at times. She voices that whenever she calls her dtr and she doesn't answer or respond right away that she gets anxious and starts thinking the worse. RN CM provided suggestions to help patient manage symptoms. She states she will talk with PCP during appt on 12/23/16 to discuss other options rather than treating with meds as she is fearful of taking a lot of meds and being alone. Patient inquiring about how much fluid she should drink in a day. States she has not been told an amount by MD. Advised patient that most HF patient are limited to 1.5 liters per day. patient states she drinks about 7-8 small cups of water a day. She reports that weight was up a little to 100 lbs today. HHRN encouraged her to drink Ensure supplements to help. Advised patient that they would continue to get automated EMMI- HF post discharge calls to assess how they are doing following recent hospitalization and will receive a call from a nurse if any of their responses were abnormal. Patient voiced understanding and was appreciative of f/u call.       Plan: RN CM will notify [facility] administrative assistant of case status.    [name], RN,BSN,CCM [place] Management Telephonic Care Management Coordinator Direct Phone: [phone] Toll Free: [phone] Fax: [phone]

## 2016-12-10 NOTE — Patient Outreach (Signed)
Patient triggered Red on EMMI Heart Failure Dashboard: notitication sent to:  [name], RN

## 2016-12-15 ENCOUNTER — Other Ambulatory Visit: Payer: Self-pay

## 2016-12-15 NOTE — Patient Outreach (Signed)
[facility]) Care Management  12/15/2016  Krystal Hensley [birth year] [id number]     EMMI-HF RED ON EMMI ALERT Day # 31 Date: 12/13/16 Red Alert Reason: "New/worsening problems? Yes" "New/worsening SOB? Yes"    Outreach attempt # 1 to patient.Spoke with patient. Reviewed and addressed red alerts. Patient states she is feeling fine today. She voices that over the weekend she felt a little more SOB. However, she states that she did more than she normally does as her son and his family came to visit and took her out. She reports she is feeling normal today and denies any new/worsening symptoms at present. She is getting ready to eat her lunch. Wgt this am was 102.4lbs. Weight yest was 101.2 lbs per pt. report. Reviewed with patient s/s of worsening condition and when to alert MD of wgt gain changes. She voiced understanding. She states she does not have any swelling at present. No further RN CM needs or concerns at this time.  Advised patient that they would continue to get automated EMMI- HF post discharge calls to assess how they are doing following recent hospitalization and will receive a call from a nurse if any of their responses were abnormal. Patient voiced understanding and was appreciative of f/u call.      Plan: RN CM will notify [facility] administrative assistant of case status.   [name], RN,BSN,CCM [place] Management Telephonic Care Management Coordinator Direct Phone: [phone] Toll Free: [phone] Fax: [phone]

## 2016-12-16 ENCOUNTER — Other Ambulatory Visit: Payer: Self-pay

## 2016-12-16 NOTE — Patient Outreach (Signed)
[facility]) Care Management  12/16/2016  Krystal Hensley Oct 21, 1917 [id number]      EMMI-HF RED ON EMMI ALERT Day # 33 Date: 12/15/16 Red Alert Reason: "Lost interest in things they used to enjoy? Yes"   Red on EMMI dashboard notification received. Patient has flagged red for this reason multiple times. She continues to reports that she trying to do the things she normally do such as knitting top help her cope. Patient continues to voice some issues with anxiety and  being lonesome as she live alone. No further RN CM interventions warranted at this time.      Plan: RN CM will notify [facility] administrative assistant of case status.   [name], RN,BSN,CCM [place] Management Telephonic Care Management Coordinator Direct Phone: [phone] Toll Free: [phone] Fax: [phone]

## 2016-12-18 ENCOUNTER — Other Ambulatory Visit: Payer: Self-pay

## 2016-12-18 NOTE — Patient Outreach (Signed)
[facility]) Care Management  12/18/2016  Krystal Hensley [birth year] [id number]     EMMI-HF RED ON EMMI ALERT Day # 35 Date: 12/17/16 Red Alert Reason:  "New/worsening problems? Yes" "New/worsening SOB? Yes"    Outreach attempt # 1 to patient. No answer at present.       Plan: RN CM will make outreach attempt to patient within one business day.   [name], RN,BSN,CCM [place] Management Telephonic Care Management Coordinator Direct Phone: [phone] Toll Free: [phone] Fax: [phone]

## 2016-12-19 ENCOUNTER — Other Ambulatory Visit: Payer: Self-pay

## 2016-12-19 NOTE — Patient Outreach (Signed)
[facility]) Care Management  12/19/2016  Krystal Hensley 11-16-1917 [id number]   EMMI-HF RED ON EMMI ALERT Day # 35 Date: 12/17/16 Red Alert Reason:  "New/worsening problems? Yes" "New/worsening SOB? Yes"   Outreach attempt #2 to patient. Spoke with patient. Stets she is just getting out the shower and is headed to the beauty salon in a few minutes. Reviewed and addressed red alerts. Patient reports that the other day her therapist came. She states that she did a lot more than usual with therapist and afterwards felt wore out and tired which resulted in her responding that way. She also states that she a was a little more SOB due to the increased exertion.  Patient also reported some arthritis and neck pain. She has been taking Tylenol to relieve symptoms. Educated patient on proper pain mgmt and Tylenol administration education. She denies any new/worsening symptoms at present. States she is feeling fine today. Her weight was 100.1 lbs this morning. She denies any further RN CM needs or concerns at this time.    Plan: RN CM will notify [facility] administrative assistant of case status.   [name], RN,BSN,CCM [place] Management Telephonic Care Management Coordinator Direct Phone: [phone] Toll Free: [phone] Fax: [phone]

## 2016-12-23 ENCOUNTER — Encounter: Payer: Self-pay | Admitting: Podiatry

## 2016-12-23 ENCOUNTER — Ambulatory Visit (INDEPENDENT_AMBULATORY_CARE_PROVIDER_SITE_OTHER): Payer: Medicare Other | Admitting: Podiatry

## 2016-12-23 DIAGNOSIS — M79674 Pain in right toe(s): Secondary | ICD-10-CM

## 2016-12-23 DIAGNOSIS — M79675 Pain in left toe(s): Secondary | ICD-10-CM | POA: Diagnosis not present

## 2016-12-23 DIAGNOSIS — B351 Tinea unguium: Secondary | ICD-10-CM

## 2016-12-23 NOTE — Progress Notes (Signed)
Subjective: 83 y.o. returns the office today for painful, elongated, thickened toenails which she cannot trim herself. Denies any redness or drainage around the nails. Denies any acute changes since last appointment and no new complaints today. Denies any systemic complaints such as fevers, chills, nausea, vomiting.   Objective: AAO 3, NAD DP/PT pulses palpable, CRT less than 3 seconds  Nails hypertrophic, dystrophic, elongated, brittle, discolored 10. There is tenderness overlying the nails 1-5 bilaterally. There is no surrounding erythema or drainage along the nail sites. Multiple digital deformities. Severe HAV on the left.  No open lesions or pre-ulcerative lesions are identified. No other areas of tenderness bilateral lower extremities. No overlying edema, erythema, increased warmth. No pain with calf compression, swelling, warmth, erythema.  Assessment: Patient presents with symptomatic onychomycosis  Plan: -Treatment options including alternatives, risks, complications were discussed -Nails sharply debrided 10 without complication/bleeding. -Discussed daily foot inspection. If there are any changes, to call the office immediately.  -Follow-up in 9 weeks or sooner if any problems are to arise. In the meantime, encouraged to call the office with any questions, concerns, changes symptoms.  Celesta Gentile, DPM

## 2017-01-07 ENCOUNTER — Emergency Department (HOSPITAL_BASED_OUTPATIENT_CLINIC_OR_DEPARTMENT_OTHER)
Admission: EM | Admit: 2017-01-07 | Discharge: 2017-01-07 | Disposition: A | Discharge status: Home / Self Care | Payer: Medicare Other | Attending: Emergency Medicine | Admitting: Emergency Medicine

## 2017-01-07 ENCOUNTER — Encounter (HOSPITAL_BASED_OUTPATIENT_CLINIC_OR_DEPARTMENT_OTHER): Payer: Self-pay | Admitting: *Deleted

## 2017-01-07 ENCOUNTER — Emergency Department (HOSPITAL_BASED_OUTPATIENT_CLINIC_OR_DEPARTMENT_OTHER): Payer: Medicare Other

## 2017-01-07 DIAGNOSIS — S8991XA Unspecified injury of right lower leg, initial encounter: Secondary | ICD-10-CM | POA: Diagnosis not present

## 2017-01-07 DIAGNOSIS — Y999 Unspecified external cause status: Secondary | ICD-10-CM | POA: Diagnosis not present

## 2017-01-07 DIAGNOSIS — I110 Hypertensive heart disease with heart failure: Secondary | ICD-10-CM | POA: Diagnosis not present

## 2017-01-07 DIAGNOSIS — T148XXA Other injury of unspecified body region, initial encounter: Secondary | ICD-10-CM | POA: Insufficient documentation

## 2017-01-07 DIAGNOSIS — Z79899 Other long term (current) drug therapy: Secondary | ICD-10-CM | POA: Insufficient documentation

## 2017-01-07 DIAGNOSIS — W19XXXA Unspecified fall, initial encounter: Secondary | ICD-10-CM

## 2017-01-07 DIAGNOSIS — Y9301 Activity, walking, marching and hiking: Secondary | ICD-10-CM | POA: Insufficient documentation

## 2017-01-07 DIAGNOSIS — W0110XA Fall on same level from slipping, tripping and stumbling with subsequent striking against unspecified object, initial encounter: Secondary | ICD-10-CM | POA: Diagnosis not present

## 2017-01-07 DIAGNOSIS — S8990XA Unspecified injury of unspecified lower leg, initial encounter: Secondary | ICD-10-CM

## 2017-01-07 DIAGNOSIS — Z7982 Long term (current) use of aspirin: Secondary | ICD-10-CM | POA: Insufficient documentation

## 2017-01-07 DIAGNOSIS — S59911A Unspecified injury of right forearm, initial encounter: Secondary | ICD-10-CM | POA: Diagnosis present

## 2017-01-07 DIAGNOSIS — S51811A Laceration without foreign body of right forearm, initial encounter: Secondary | ICD-10-CM

## 2017-01-07 DIAGNOSIS — Y929 Unspecified place or not applicable: Secondary | ICD-10-CM | POA: Diagnosis not present

## 2017-01-07 DIAGNOSIS — I5033 Acute on chronic diastolic (congestive) heart failure: Secondary | ICD-10-CM | POA: Insufficient documentation

## 2017-01-07 MED ORDER — TRAMADOL HCL 50 MG PO TABS: 50.0000 mg | ORAL_TABLET | Freq: Four times a day (QID) | ORAL | 0 refills | Status: DC | PRN

## 2017-01-07 NOTE — ED Triage Notes (Signed)
Pt brought in by ems from home  fall x 6 hrs, c/o right knee pain and skin tear to right lower arm  X 2

## 2017-01-07 NOTE — ED Provider Notes (Signed)
[facility] DEPT [facility] Provider Note   CSN: [id number] Arrival date & time: 01/07/17  1714     History   Chief Complaint Chief Complaint  Patient presents with  . Fall    HPI Krystal Hensley is a 83 y.o. female.  Patient with a fall at home stumbled with her walker. Went down hard on her right knee and on her right forearm. Patient was skin tears to her right forearm. States that tetanus is up-to-date. Patient has a history of arthritis. Main complaint is pain in the right knee. No complain of any hip pain. No chest pain no abdominal pain no back pain no neck pain beyond her arthritic pain.      Past Medical History:  Diagnosis Date  . Arthritis   . Back pain   . Chronic diastolic CHF (congestive heart failure) ([place])   . Colon cancer ([place])   . HTN (hypertension) 08/08/2013  . Hypertension   . Hypoglycemia   . PAF (paroxysmal atrial fibrillation) ([place])   . Polymyalgia rheumatica ([place])   . Pulmonary hypertension ([place])    a. Echo 07/2014 - mildly increased PASP.  Marland Kitchen Stroke [facility])     Patient Active Problem List   Diagnosis Date Noted  . Shortness of breath 11/09/2016  . Hyponatremia 11/22/2015  . Acute on chronic diastolic CHF (congestive heart failure), NYHA class 2 ([place]) 02/15/2015  . Dyspnea 07/24/2014  . Abdominal pain, right upper quadrant 07/24/2014  . Chronic diastolic CHF (congestive heart failure) ([place]) 07/24/2014  . Atrial fibrillation with RVR ([place]) 06/14/2014  . Constipation 04/03/2014  . Pulmonary hypertension ([place]) 02/02/2014  . Chest pain 01/14/2014  . Cerebral infarction ([place]) 08/08/2013  . RUE weakness 08/08/2013  . HTN (hypertension) 08/08/2013  . PMR (polymyalgia rheumatica) (H[facility]) 08/08/2013  . Spastic hemiplegia affecting dominant side ([place]) 08/08/2013  . Wound of left leg 08/08/2013  . Hx of PAF- CHADS VASC=7 08/08/2013    Past Surgical History:  Procedure Laterality Date  . BILATERAL OOPHORECTOMY    . CESAREAN SECTION    . COLON SURGERY     . EYE SURGERY      OB History    No data available       Home Medications    Prior to Admission medications   Medication Sig Start Date End Date Taking? Authorizing Provider  acetaminophen (TYLENOL) 500 MG tablet Take 1 tablet (500 mg total) by mouth every 6 (six) hours as needed. Patient taking differently: Take 500 mg by mouth every 6 (six) hours as needed for moderate pain.  06/15/14   Erlene Quan, PA-C  aspirin 81 MG chewable tablet Chew 81 mg by mouth daily.    [provider]  calcium-vitamin D (OSCAL WITH D) 500-200 MG-UNIT per tablet Take 1 tablet by mouth daily with breakfast.    [provider]  diclofenac (FLECTOR) 1.3 % PTCH Place 0.5 patches onto the skin 2 (two) times daily as needed (for pain).     [provider]  diltiazem (CARDIZEM CD) 180 MG 24 hr capsule Take 1 capsule (180 mg total) by mouth daily. 11/12/16   Thurnell Lose, MD  docusate sodium (COLACE) 100 MG capsule Take 200 mg by mouth daily.     [provider]  furosemide (LASIX) 20 MG tablet Take 20 mg by mouth daily.    [provider]  metoprolol succinate (TOPROL-XL) 50 MG 24 hr tablet take 1 tablet by mouth once daily take with food or IMMEDIATELY  FOLLOWING A MEAL 06/10/16   Dorothy Spark, MD  Multiple Vitamins-Minerals (VISION FORMULA/LUTEIN PO) Take 1 tablet by mouth daily at 6 (six) AM.    [provider]  omeprazole (PRILOSEC) 20 MG capsule Take 20 mg by mouth daily.    [provider]  polyethylene glycol (MIRALAX / GLYCOLAX) packet Take 17 g by mouth daily.    [provider]  sennosides-docusate sodium (SENOKOT-S) 8.6-50 MG tablet Take 1 tablet by mouth daily as needed for constipation.     [provider]  silver sulfADIAZINE (SILVADENE) 1 % cream Apply 1 application topically daily. 04/30/16   Trula Slade, DPM  traMADol (ULTRAM) 50 MG tablet Take 1 tablet (50 mg total) by mouth every 6 (six) hours as  needed. 01/07/17   Fredia Sorrow, MD    Family History Family History  Problem Relation Age of Onset  . Aneurysm Mother   . Heart attack Father     Social History Social History  Substance Use Topics  . Smoking status: Never Smoker  . Smokeless tobacco: Never Used  . Alcohol use No     Allergies   Chocolate and Statins   Review of Systems Review of Systems  Constitutional: Negative for fever.  HENT: Negative for congestion.   Eyes: Negative for visual disturbance.  Respiratory: Negative for shortness of breath.   Cardiovascular: Negative for chest pain.  Gastrointestinal: Negative for abdominal pain.  Genitourinary: Negative for dysuria.  Musculoskeletal: Positive for joint swelling. Negative for back pain.  Skin: Positive for wound. Negative for rash.  Neurological: Negative for headaches.  Hematological: Does not bruise/bleed easily.  Psychiatric/Behavioral: Negative for confusion.     Physical Exam Updated Vital Signs BP 111/63   Pulse 63   Temp 97.9 F (36.6 C) (Oral)   Resp 18   Ht 1.575 m (5\' 2" )   Wt 45.8 kg (101 lb)   SpO2 97%   BMI 18.47 kg/m   Physical Exam  Constitutional: She appears well-developed and well-nourished. No distress.  HENT:  Head: Normocephalic and atraumatic.  Mouth/Throat: Oropharynx is clear and moist.  Eyes: Conjunctivae and EOM are normal. Pupils are equal, round, and reactive to light.  Neck: Normal range of motion. Neck supple.  Cardiovascular: Normal rate, regular rhythm and normal heart sounds.   Pulmonary/Chest: Effort normal and breath sounds normal. No respiratory distress.  Abdominal: Soft. Bowel sounds are normal. There is no tenderness.  Musculoskeletal: She exhibits edema.  Decreased range of motion of the right knee. Some soft tissue swelling no distinct effusion patella is not dislocated. No tenderness to the hip area. Tenderness to palpation in the right knee. Other extremities normal. Except for right  forearm has 2, 1-2 cm skin tears on the forearm. No active bleeding. No evidence of any bony injury. In addition significant arthritic changes to the right hand greater than the left hand.  Skin: Skin is warm. Capillary refill takes less than 2 seconds.  Nursing note and vitals reviewed.    ED Treatments / Results  Labs (all labs ordered are listed, but only abnormal results are displayed) Labs Reviewed - No data to display  EKG  EKG Interpretation None       Radiology Dg Knee Complete 4 Views Right  Result Date: 01/07/2017 CLINICAL DATA:  Pain and swelling after fall EXAM: RIGHT KNEE - COMPLETE 4+ VIEW COMPARISON:  05/11/2014 FINDINGS: Osteopenic appearance of the right knee with joint space narrowing of the femorotibial and patellofemoral compartments. Chondrocalcinosis  of hyaline cartilage is identified. Vascular calcifications are noted along the posterior aspect of the visualized knee. No joint effusion. No fracture. Mild soft tissue swelling is noted about the knee more so laterally. IMPRESSION: 1. Osteopenia. 2. Soft tissue swelling about the knee more so laterally. 3. Tricompartmental osteoarthritis. 4. No fracture or joint effusion.  No dislocations. Electronically Signed   By: Ashley Royalty M.D.   On: 01/07/2017 18:09    Procedures Procedures (including critical care time)  Medications Ordered in ED Medications - No data to display   Initial Impression / Assessment and Plan / ED Course  I have reviewed the triage vital signs and the nursing notes.  Pertinent labs & imaging results that were available during my care of the patient were reviewed by me and considered in my medical decision making (see chart for details).     Patient with a fall at home. Stumbled with her walker. No loss of consciousness. Tetanus is up-to-date. Patient was swelling and pain to the right knee. Skin tears to the right forearm. X-rays of the knee show soft tissue swelling about the knee without  evidence of a distinct effusion. No bony injuries. Will treat with knee immobilizer follow-up with sports medicine. Wound care for the skin tears.  Final Clinical Impressions(s) / ED Diagnoses   Final diagnoses:  Fall, initial encounter  Skin tear of right forearm without complication, initial encounter  Knee injury, initial encounter    New Prescriptions New Prescriptions   TRAMADOL (ULTRAM) 50 MG TABLET    Take 1 tablet (50 mg total) by mouth every 6 (six) hours as needed.     Fredia Sorrow, MD 01/07/17 1924

## 2017-01-07 NOTE — ED Notes (Signed)
ED Provider at bedside. 

## 2017-01-07 NOTE — Discharge Instructions (Signed)
Regarding the skin tears to the right forearm keep the dressings currently on in place for 2 days. Then removed worse gently with soap and water apply antibiotic ointment and redress. Regarding the knee where knee immobilizer at all times except for when bathing. Make an appointment to follow-up with sports medicine. Take Tylenol 650 mg every 6 hours as needed for pain. If you need additional pain control prescription for tramadol has been provided.

## 2017-01-14 ENCOUNTER — Encounter: Payer: Self-pay | Admitting: Family Medicine

## 2017-01-14 ENCOUNTER — Ambulatory Visit (INDEPENDENT_AMBULATORY_CARE_PROVIDER_SITE_OTHER): Payer: Medicare Other | Admitting: Family Medicine

## 2017-01-14 DIAGNOSIS — S8991XA Unspecified injury of right lower leg, initial encounter: Secondary | ICD-10-CM

## 2017-01-14 DIAGNOSIS — S8991XD Unspecified injury of right lower leg, subsequent encounter: Secondary | ICD-10-CM | POA: Insufficient documentation

## 2017-01-14 DIAGNOSIS — R000 Tachycardia, unspecified: Secondary | ICD-10-CM

## 2017-01-14 HISTORY — DX: Unspecified injury of right lower leg, subsequent encounter: S89.91XD

## 2017-01-14 HISTORY — DX: Tachycardia, unspecified: R00.0

## 2017-01-14 NOTE — Patient Instructions (Signed)
Take your lasix when you get home. Dr. Carlyle Lipa office will be calling you - if you haven't heard from them in a couple hours give his office a call. You have a knee contusion but otherwise your exam is reassuring. Ice the knee 15 minutes at a time 3-4 times a day. Elevate above your heart as much as possible. Use the walker when up and walking around for support. Knee brace for support for the next couple weeks also. Follow up with me in 2 weeks for reevaluation.

## 2017-01-14 NOTE — Assessment & Plan Note (Signed)
independently reviewed radiographs and no evidence fracture.  Exam is very reassuring and with only minimal tenderness.  2/2 contusion.  Motion exercises, straight leg raises, knee extensions.  Icing, knee brace for support.  Use walker.  Tylenol if needed.  F/u in 2 weeks.

## 2017-01-14 NOTE — Progress Notes (Signed)
PCP: Lajean Manes, MD  Subjective:   HPI: Patient is a 83 y.o. female here for right knee injury.  Patient reports on 7/4 she reached up to turn on ceiling fan and fell to right side striking her right knee directly onto the floor. Had walker in front of her at the time. Also fell onto right forearm and hit head but no issues here. Pain is up to 10/10 in immobilizer - very uncomfortable and keeps sliding down;  Pain sharp around anterior, lateral knee. Some swelling and bruising locally. No numbness.  Past Medical History:  Diagnosis Date  . Arthritis   . Back pain   . Chronic diastolic CHF (congestive heart failure) ([place])   . Colon cancer ([place])   . HTN (hypertension) 08/08/2013  . Hypertension   . Hypoglycemia   . PAF (paroxysmal atrial fibrillation) ([place])   . Polymyalgia rheumatica ([place])   . Pulmonary hypertension ([place])    a. Echo 07/2014 - mildly increased PASP.  Marland Kitchen Stroke [facility])     Current Outpatient Prescriptions on File Prior to Visit  Medication Sig Dispense Refill  . acetaminophen (TYLENOL) 500 MG tablet Take 1 tablet (500 mg total) by mouth every 6 (six) hours as needed. (Patient taking differently: Take 500 mg by mouth every 6 (six) hours as needed for moderate pain. ) 30 tablet 0  . aspirin 81 MG chewable tablet Chew 81 mg by mouth daily.    . calcium-vitamin D (OSCAL WITH D) 500-200 MG-UNIT per tablet Take 1 tablet by mouth daily with breakfast.    . diclofenac (FLECTOR) 1.3 % PTCH Place 0.5 patches onto the skin 2 (two) times daily as needed (for pain).     Marland Kitchen diltiazem (CARDIZEM CD) 180 MG 24 hr capsule Take 1 capsule (180 mg total) by mouth daily. 30 capsule 0  . docusate sodium (COLACE) 100 MG capsule Take 200 mg by mouth daily.     . furosemide (LASIX) 20 MG tablet Take 20 mg by mouth daily.    . metoprolol succinate (TOPROL-XL) 50 MG 24 hr tablet take 1 tablet by mouth once daily take with food or IMMEDIATELY FOLLOWING A MEAL 90 tablet 2  . Multiple  Vitamins-Minerals (VISION FORMULA/LUTEIN PO) Take 1 tablet by mouth daily at 6 (six) AM.    . omeprazole (PRILOSEC) 20 MG capsule Take 20 mg by mouth daily.    . polyethylene glycol (MIRALAX / GLYCOLAX) packet Take 17 g by mouth daily.    . sennosides-docusate sodium (SENOKOT-S) 8.6-50 MG tablet Take 1 tablet by mouth daily as needed for constipation.     . silver sulfADIAZINE (SILVADENE) 1 % cream Apply 1 application topically daily. 50 g 0  . traMADol (ULTRAM) 50 MG tablet Take 1 tablet (50 mg total) by mouth every 6 (six) hours as needed. 15 tablet 0   No current facility-administered medications on file prior to visit.     Past Surgical History:  Procedure Laterality Date  . BILATERAL OOPHORECTOMY    . CESAREAN SECTION    . COLON SURGERY    . EYE SURGERY      Allergies  Allergen Reactions  . Chocolate Other (See Comments)    Unknown  . Statins Other (See Comments)    myalgia    Social History   Social History  . Marital status: Widowed    Spouse name: N/A  . Number of children: N/A  . Years of education: N/A   Occupational History  . Not on file.  Social History Main Topics  . Smoking status: Never Smoker  . Smokeless tobacco: Never Used  . Alcohol use No  . Drug use: No  . Sexual activity: No   Other Topics Concern  . Not on file   Social History Narrative  . No narrative on file    Family History  Problem Relation Age of Onset  . Aneurysm Mother   . Heart attack Father     BP 119/82   Pulse (!) 147   Ht 5\' 2"  (1.575 m)   Wt 106 lb (48.1 kg)   BMI 19.39 kg/m   Review of Systems: See HPI above.     Objective:  Physical Exam:  Gen: NAD, comfortable in exam room  Right knee: Immobilizer removed. No gross deformity, ecchymoses, effusion. Minimal TTP lateral joint line.  No other tenderness. ROM 0 - 120 degrees. Negative ant/post drawers. Negative valgus/varus testing. Negative lachmanns. Negative mcmurrays, apleys, patellar  apprehension. NV intact distally.  Left knee: FROM without pain.   CV:  Tachycardic, rhythm ?normal.  Repeat manual pulse check 150 Lungs:  Able to speak in full sentences.  No rales, rhonchi, wheezes.  Assessment & Plan:  1. Right knee injury - independently reviewed radiographs and no evidence fracture.  Exam is very reassuring and with only minimal tenderness.  2/2 contusion.  Motion exercises, straight leg raises, knee extensions.  Icing, knee brace for support.  Use walker.  Tylenol if needed.  F/u in 2 weeks.  2. Tachycardia - in patient with CHF and history of paroxysmal afib.  She states she took her medications this morning except lasix.  Called Dr. Felipa Eth to notify him and ask for further recommendations - he reported his office will call patient, likely to increase toprol dosage today and follow up with her.

## 2017-01-14 NOTE — Assessment & Plan Note (Signed)
in patient with CHF and history of paroxysmal afib.  She states she took her medications this morning except lasix.  Called Dr. Felipa Eth to notify him and ask for further recommendations - he reported his office will call patient, likely to increase toprol dosage today and follow up with her.

## 2017-01-28 ENCOUNTER — Ambulatory Visit (INDEPENDENT_AMBULATORY_CARE_PROVIDER_SITE_OTHER): Payer: Medicare Other | Admitting: Family Medicine

## 2017-01-28 ENCOUNTER — Encounter: Payer: Self-pay | Admitting: Family Medicine

## 2017-01-28 DIAGNOSIS — S8991XD Unspecified injury of right lower leg, subsequent encounter: Secondary | ICD-10-CM | POA: Diagnosis present

## 2017-01-28 NOTE — Patient Instructions (Signed)
You can stop using the knee brace now. Take tylenol only if needed. Do knee extension exercises up to 3 sets of 10 but no weight. Continue working with physical therapy. Call me if you have any problems otherwise follow up as needed.

## 2017-01-29 NOTE — Progress Notes (Signed)
PCP: Lajean Manes, MD  Subjective:   HPI: Patient is a 83 y.o. female here for right knee injury.  7/11: Patient reports on 7/4 she reached up to turn on ceiling fan and fell to right side striking her right knee directly onto the floor. Had walker in front of her at the time. Also fell onto right forearm and hit head but no issues here. Pain is up to 10/10 in immobilizer - very uncomfortable and keeps sliding down;  Pain sharp around anterior, lateral knee. Some swelling and bruising locally. No numbness.  7/25: Patient reports she's doing better. Only with some aching in distal right thigh. Using knee brace. Pain level 0/10 currently. No skin changes, swelling. Using walker or cane at home.  Past Medical History:  Diagnosis Date  . Arthritis   . Back pain   . Chronic diastolic CHF (congestive heart failure) ([place])   . Colon cancer ([place])   . HTN (hypertension) 08/08/2013  . Hypertension   . Hypoglycemia   . PAF (paroxysmal atrial fibrillation) ([place])   . Polymyalgia rheumatica ([place])   . Pulmonary hypertension ([place])    a. Echo 07/2014 - mildly increased PASP.  Marland Kitchen Stroke [facility])     Current Outpatient Prescriptions on File Prior to Visit  Medication Sig Dispense Refill  . acetaminophen (TYLENOL) 500 MG tablet Take 1 tablet (500 mg total) by mouth every 6 (six) hours as needed. (Patient taking differently: Take 500 mg by mouth every 6 (six) hours as needed for moderate pain. ) 30 tablet 0  . aspirin 81 MG chewable tablet Chew 81 mg by mouth daily.    . calcium-vitamin D (OSCAL WITH D) 500-200 MG-UNIT per tablet Take 1 tablet by mouth daily with breakfast.    . diclofenac (FLECTOR) 1.3 % PTCH Place 0.5 patches onto the skin 2 (two) times daily as needed (for pain).     Marland Kitchen diltiazem (CARDIZEM CD) 180 MG 24 hr capsule Take 1 capsule (180 mg total) by mouth daily. 30 capsule 0  . docusate sodium (COLACE) 100 MG capsule Take 200 mg by mouth daily.     . furosemide (LASIX) 20 MG  tablet Take 20 mg by mouth daily.    . metoprolol succinate (TOPROL-XL) 50 MG 24 hr tablet take 1 tablet by mouth once daily take with food or IMMEDIATELY FOLLOWING A MEAL 90 tablet 2  . Multiple Vitamins-Minerals (VISION FORMULA/LUTEIN PO) Take 1 tablet by mouth daily at 6 (six) AM.    . omeprazole (PRILOSEC) 20 MG capsule Take 20 mg by mouth daily.    . polyethylene glycol (MIRALAX / GLYCOLAX) packet Take 17 g by mouth daily.    . sennosides-docusate sodium (SENOKOT-S) 8.6-50 MG tablet Take 1 tablet by mouth daily as needed for constipation.     . silver sulfADIAZINE (SILVADENE) 1 % cream Apply 1 application topically daily. 50 g 0  . traMADol (ULTRAM) 50 MG tablet Take 1 tablet (50 mg total) by mouth every 6 (six) hours as needed. 15 tablet 0   No current facility-administered medications on file prior to visit.     Past Surgical History:  Procedure Laterality Date  . BILATERAL OOPHORECTOMY    . CESAREAN SECTION    . COLON SURGERY    . EYE SURGERY      Allergies  Allergen Reactions  . Chocolate Other (See Comments)    Unknown  . Statins Other (See Comments)    myalgia    Social History   Social  History  . Marital status: Widowed    Spouse name: N/A  . Number of children: N/A  . Years of education: N/A   Occupational History  . Not on file.   Social History Main Topics  . Smoking status: Never Smoker  . Smokeless tobacco: Never Used  . Alcohol use No  . Drug use: No  . Sexual activity: No   Other Topics Concern  . Not on file   Social History Narrative  . No narrative on file    Family History  Problem Relation Age of Onset  . Aneurysm Mother   . Heart attack Father     BP 120/65   Pulse 76   Ht 5\' 2"  (1.575 m)   Wt 104 lb (47.2 kg)   BMI 19.02 kg/m   Review of Systems: See HPI above.     Objective:  Physical Exam:  Gen: NAD, comfortable in exam room  Right knee: No gross deformity, ecchymoses, effusion. Mild TTP medial joint line but  similar on the left.  No other tenderness. FROM. Negative ant/post drawers. Negative valgus/varus testing. Negative lachmanns. Negative mcmurrays, apleys, patellar apprehension. NV intact distally.  Left knee: FROM without pain.   Assessment & Plan:  1. Right knee injury - Radiographs negative.  Exam reassuring also.  2/2 contusion.  Shown strengthening exercises to do daily.  Tylenol if needed, icing if needed.  Call with any problems.  Continue working with PT.  F/u prn.

## 2017-01-29 NOTE — Assessment & Plan Note (Signed)
Radiographs negative.  Exam reassuring also.  2/2 contusion.  Shown strengthening exercises to do daily.  Tylenol if needed, icing if needed.  Call with any problems.  Continue working with PT.  F/u prn.

## 2017-02-24 ENCOUNTER — Ambulatory Visit: Payer: Medicare Other | Admitting: Podiatry

## 2017-03-03 ENCOUNTER — Encounter: Payer: Self-pay | Admitting: Podiatry

## 2017-03-03 ENCOUNTER — Ambulatory Visit (INDEPENDENT_AMBULATORY_CARE_PROVIDER_SITE_OTHER): Payer: Medicare Other | Admitting: Podiatry

## 2017-03-03 DIAGNOSIS — M79674 Pain in right toe(s): Secondary | ICD-10-CM | POA: Diagnosis not present

## 2017-03-03 DIAGNOSIS — B351 Tinea unguium: Secondary | ICD-10-CM | POA: Diagnosis not present

## 2017-03-03 DIAGNOSIS — M79675 Pain in left toe(s): Secondary | ICD-10-CM

## 2017-03-03 NOTE — Progress Notes (Signed)
Subjective: 83 y.o. returns the office today for painful, elongated, thickened toenails which she cannot trim herself. Denies any redness or drainage around the nails. Denies any acute changes since last appointment and no new complaints today. Denies any systemic complaints such as fevers, chills, nausea, vomiting.   Objective: AAO 3, NAD DP/PT pulses palpable, CRT less than 3 seconds  Nails hypertrophic, dystrophic, elongated, brittle, discolored 10. There is tenderness overlying the nails 1-5 bilaterally. There is no surrounding erythema or drainage along the nail sites. Multiple digital deformities. Severe HAV on the left and hammetoes b/l.  No open lesions or pre-ulcerative lesions are identified. No other areas of tenderness bilateral lower extremities. No overlying edema, erythema, increased warmth. No pain with calf compression, swelling, warmth, erythema.  Assessment: Patient presents with symptomatic onychomycosis  Plan: -Treatment options including alternatives, risks, complications were discussed -Nails sharply debrided 10 without complication/bleeding. -Discussed daily foot inspection. If there are any changes, to call the office immediately.  -Follow-up in 9 weeks or sooner if any problems are to arise. In the meantime, encouraged to call the office with any questions, concerns, changes symptoms.  Celesta Gentile, DPM

## 2017-03-19 ENCOUNTER — Other Ambulatory Visit: Payer: Self-pay | Admitting: Cardiology

## 2017-04-13 ENCOUNTER — Ambulatory Visit: Payer: Medicare Other | Admitting: Physician Assistant

## 2017-04-27 ENCOUNTER — Encounter: Payer: Self-pay | Admitting: Physician Assistant

## 2017-04-27 ENCOUNTER — Ambulatory Visit (INDEPENDENT_AMBULATORY_CARE_PROVIDER_SITE_OTHER): Payer: Medicare Other | Admitting: Physician Assistant

## 2017-04-27 ENCOUNTER — Encounter (INDEPENDENT_AMBULATORY_CARE_PROVIDER_SITE_OTHER): Payer: Self-pay

## 2017-04-27 VITALS — BP 136/70 | HR 90 | Ht 62.0 in | Wt 120.8 lb

## 2017-04-27 DIAGNOSIS — I480 Paroxysmal atrial fibrillation: Secondary | ICD-10-CM | POA: Diagnosis not present

## 2017-04-27 DIAGNOSIS — I5033 Acute on chronic diastolic (congestive) heart failure: Secondary | ICD-10-CM | POA: Diagnosis not present

## 2017-04-27 DIAGNOSIS — I10 Essential (primary) hypertension: Secondary | ICD-10-CM

## 2017-04-27 NOTE — Progress Notes (Signed)
Cardiology Office Note    Date:  04/27/2017   ID:  Krystal Hensley, DOB 05-29-1918, MRN [id number]  PCP:  Lajean Manes, MD  Cardiologist: Dr. Meda Coffee  Chief Complaint  Patient presents with  . Shortness of Breath    History of Present Illness:  Krystal Hensley is a 83 y.o. female with history of paroxysmal atrial fibrillation CHADSVASC=7 not on anticoagulation due to fall risk advanced age and patient choice, admitted with acute on chronic diastolic CHF and rapid A. fib 11/09/16. She was treated with IV Lasix and Toprol was increased to 75 mg twice a day. The patient has not fallen in over a year and after a long detailed discussion with Dr. Johnsie Cancel she was placed on Eliquis 2.5 mg twice a day.She also has history of CVA, hypertension, pulmonary hypertension, PMR.  I saw the patient follow-up 11/2016 and she decided she didn't want to take the Eliquis because she bleeds too easily and she switch back to aspirin. She also was urinating too much soda cutter blade Lasix back to 20 mg a day. She was well compensated that day and I told her she could continue 20 mg daily and take an extra for weekend of 2 or 3 pounds overnight.  Patient comes in today accompanied by her daughter. She is obviously short of breath. Her weight is up 12 pounds since I last saw her. She says it's been going on for over a week. She celebrated her 89th birthday and had some barbecue never since then she has felt poorly. She did see Dr. Felipa Eth 2 weeks ago and they said her heart rate was going to fast and he increased her Toprol. They're frustrated because home health has been checking her and hasn't picked up on the fluid overload. Her scales didn't have a battery so she didn't realize how much weight she did gain. She is very weak and can't do as much for herself anymore. She is still living alone but has meals brought in and aides coming to check on her.   Past Medical History:  Diagnosis Date  . Arthritis   .  Back pain   . Chronic diastolic CHF (congestive heart failure) ([place])   . Colon cancer ([place])   . HTN (hypertension) 08/08/2013  . Hypertension   . Hypoglycemia   . PAF (paroxysmal atrial fibrillation) ([place])   . Polymyalgia rheumatica ([place])   . Pulmonary hypertension ([place])    a. Echo 07/2014 - mildly increased PASP.  Marland Kitchen Stroke [facility])     Past Surgical History:  Procedure Laterality Date  . BILATERAL OOPHORECTOMY    . CESAREAN SECTION    . COLON SURGERY    . EYE SURGERY      Current Medications: Current Meds  Medication Sig  . acetaminophen (TYLENOL) 500 MG tablet Take 500 mg by mouth every 6 (six) hours as needed for moderate pain.  Marland Kitchen aspirin 81 MG chewable tablet Chew 81 mg by mouth daily.  . busPIRone (BUSPAR) 5 MG tablet Take 5 mg by mouth daily.   . calcium-vitamin D (OSCAL WITH D) 500-200 MG-UNIT per tablet Take 1 tablet by mouth daily with breakfast.  . diclofenac (FLECTOR) 1.3 % PTCH Place 0.5 patches onto the skin 2 (two) times daily as needed (for pain).   Marland Kitchen diltiazem (CARDIZEM CD) 180 MG 24 hr capsule Take 1 capsule (180 mg total) by mouth daily.  Marland Kitchen docusate sodium (COLACE) 100 MG capsule Take 200 mg by mouth daily.   Marland Kitchen  furosemide (LASIX) 20 MG tablet Take 20 mg by mouth daily.  . metoprolol succinate (TOPROL-XL) 50 MG 24 hr tablet take 1 tablet by mouth once daily with food or IMMEDIATELY FOLLOWING A MEAL  . Multiple Vitamins-Minerals (VISION FORMULA/LUTEIN PO) Take 1 tablet by mouth 2 (two) times daily.   Marland Kitchen omeprazole (PRILOSEC) 20 MG capsule Take 20 mg by mouth daily.  . polyethylene glycol (MIRALAX / GLYCOLAX) packet Take 17 g by mouth daily.  . predniSONE (DELTASONE) 5 MG tablet Take 5 mg by mouth daily.   . sennosides-docusate sodium (SENOKOT-S) 8.6-50 MG tablet Take 1 tablet by mouth daily as needed for constipation.   . silver sulfADIAZINE (SILVADENE) 1 % cream Apply 1 application topically daily.  . traMADol (ULTRAM) 50 MG tablet Take 1 tablet (50 mg total) by mouth  every 6 (six) hours as needed.     Allergies:   Chocolate and Statins   Social History   Social History  . Marital status: Widowed    Spouse name: N/A  . Number of children: N/A  . Years of education: N/A   Social History Main Topics  . Smoking status: Never Smoker  . Smokeless tobacco: Never Used  . Alcohol use No  . Drug use: No  . Sexual activity: No   Other Topics Concern  . None   Social History Narrative  . None     Family History:  The patient's family history includes Aneurysm in her mother; Heart attack in her father.   ROS:   Please see the history of present illness.    Review of Systems  Constitution: Positive for weakness and malaise/fatigue.  HENT: Negative.   Eyes: Negative.   Cardiovascular: Positive for dyspnea on exertion and leg swelling.  Respiratory: Positive for shortness of breath and wheezing.   Hematologic/Lymphatic: Negative.   Musculoskeletal: Negative.  Negative for joint pain.  Gastrointestinal: Negative.   Genitourinary: Negative.    All other systems reviewed and are negative.   PHYSICAL EXAM:   VS:  BP 136/70   Pulse 90   Ht 5\' 2"  (1.575 m)   Wt 120 lb 12.8 oz (54.8 kg)   SpO2 99%   BMI 22.09 kg/m   Physical Exam  GEN: Elderly, short of breath when she speaks, in no acute distress  Neck: Increased JVD, carotid bruits, or masses Cardiac: Irregular irregular with 2/6 systolic murmur at the left sternal border Respiratory:  Decreased breath sounds but clear GI: soft, nontender, nondistended, + BS Ext: Ankle edema bilaterally left greater than right with brawny changes Neuro:  Alert and Oriented x 3 Psych: euthymic mood, full affect  Wt Readings from Last 3 Encounters:  04/27/17 120 lb 12.8 oz (54.8 kg)  01/28/17 104 lb (47.2 kg)  01/14/17 106 lb (48.1 kg)      Studies/Labs Reviewed:   EKG:  EKG is  ordered today.  The ekg ordered today demonstratesAtrial fibrillation at 90 bpm  Recent Labs: 11/09/2016: B Natriuretic  Peptide 502.7; Hemoglobin 13.9; Magnesium 2.2; Platelets 251; TSH 1.171 11/11/2016: BUN 14; Creatinine, Ser 0.65; Potassium 3.7; Sodium 136   Lipid Panel    Component Value Date/Time   CHOL 216 (H) 08/09/2013 0326   TRIG 116 08/09/2013 0326   HDL 73 08/09/2013 0326   CHOLHDL 3.0 08/09/2013 0326   VLDL 23 08/09/2013 0326   LDLCALC 120 (H) 08/09/2013 0326    Additional studies/ records that were reviewed today include:   TTE: Last echo 07/25/2014 - Left ventricle:  The cavity size was normal. Wall thickness was   increased in a pattern of mild LVH. There was mild focal basal   hypertrophy of the septum. Systolic function was vigorous. The   estimated ejection fraction was in the range of 65% to 70%. Wall   motion was normal; there were no regional wall motion   abnormalities. - Aortic valve: There was mild regurgitation. - Mitral valve: Calcified annulus. There was mild regurgitation. - Left atrium: The atrium was moderately dilated. - Pulmonary arteries: Systolic pressure was mildly increased.  Impressions: - Normal LV function; mild LVH; moderate LAE; mild MR, AI and TR;   mildly elevated pulmonary pressure.   Telemetry: a-fib rate controlled, personally reviewed        ASSESSMENT:    1. Hx of PAF- CHADS VASC=7   2. Acute on chronic diastolic CHF (congestive heart failure) ([place])   3. Essential hypertension      PLAN:  In order of problems listed above:   PAF CHADSVASC=7 on ASA b/c of easy bleeding, falls,age and patient choice.Rate was fast when she saw Dr. Felipa Eth 2 weeks ago and Toprol was increased. Rate is better controlled today. Could have contributed to her heart failure.  Acute on Chronic diastolic CHF patient's weight is up 12 pounds and she is short of breath. Increase Lasix to 40 mg daily for 4 days then 20 mg daily. May take an extra 20 mg of Lasix for weight gain of 2 or 3 pounds overnight. 2 g sodium diet. Weight daily. I'd like to see her back in 2  weeks but they have difficulty eating her out of the house and she has 2 appointments next week. I told them to call and schedule appointment if she is not improved. We'll check labs today. I'll up with Dr. Meda Coffee in January.  Essential HTN blood pressure stable.   Medication Adjustments/Labs and Tests Ordered: Current medicines are reviewed at length with the patient today.  Concerns regarding medicines are outlined above.  Medication changes, Labs and Tests ordered today are listed in the Patient Instructions below. Patient Instructions  Medication Instructions:  1. INCREASE LASIX TO 20 MG TWICE DAILY FOR 4 DAYS; AFTER THE 4 DAYS GO BACK TO YOUR CURRENT DOSE OF LASIX 20 MG DAILY  Labwork: TODAY BMET, PRO BNP  Testing/Procedures: NONE ORDERED TODAY  Follow-Up: Ermalinda Barrios, PAC  DR. Meda Coffee   Any Other Special Instructions Will Be Listed Below (If Applicable).     If you need a refill on your cardiac medications before your next appointment, please call your pharmacy.      Signed, Ermalinda Barrios, PA-C  04/27/2017 [facility] [place], [place], [place]  [address] Phone: [phone]; Fax: [phone]

## 2017-04-27 NOTE — Patient Instructions (Addendum)
Medication Instructions:  1. INCREASE LASIX TO 20 MG TWICE DAILY FOR 4 DAYS; AFTER THE 4 DAYS GO BACK TO YOUR CURRENT DOSE OF LASIX 20 MG DAILY  Labwork: TODAY BMET, PRO BNP  Testing/Procedures: NONE ORDERED TODAY  Follow-Up: MICHELE LENZE, PAC IN 2 WEEKS; CALL THE OFFICE IF YOU FEEL YOU NEED TO BE SEEN   07/22/17 @ 10:20 WITH DR. Meda Coffee   Any Other Special Instructions Will Be Listed Below (If Applicable). MONITOR YOUR WEIGHT DAILY. IF YOUR WEIGHT IS UP 2-3 LB'S OVER NIGHT YOU CAN TAKE AN EXTRA LASIX 20 MG THAT DAY     If you need a refill on your cardiac medications before your next appointment, please call your pharmacy.

## 2017-04-28 LAB — BASIC METABOLIC PANEL
BUN / CREAT RATIO: 41 — AB (ref 12–28)
BUN: 21 mg/dL (ref 10–36)
CHLORIDE: 99 mmol/L (ref 96–106)
CO2: 22 mmol/L (ref 20–29)
Calcium: 8.5 mg/dL — ABNORMAL LOW (ref 8.7–10.3)
Creatinine, Ser: 0.51 mg/dL — ABNORMAL LOW (ref 0.57–1.00)
GFR calc Af Amer: 93 mL/min/{1.73_m2} (ref >59)
GFR calc non Af Amer: 81 mL/min/{1.73_m2} (ref >59)
GLUCOSE: 108 mg/dL — AB (ref 65–99)
Potassium: 5.1 mmol/L (ref 3.5–5.2)
SODIUM: 137 mmol/L (ref 134–144)

## 2017-04-28 LAB — PRO B NATRIURETIC PEPTIDE: NT-Pro BNP: 3438 pg/mL — ABNORMAL HIGH (ref 0–738)

## 2017-04-30 ENCOUNTER — Telehealth: Payer: Self-pay | Admitting: Physician Assistant

## 2017-04-30 NOTE — Telephone Encounter (Signed)
Donita, HHRN, further reports pt Lasix was increased to 40 mg since Monday. She reports LL rhonchi, respirations 36-40, BP 88/60. She tells me that she advised pt she needed to go to hospital and calling to confirm this is correct advisement. Informed that pt needs to be seen in E.D. She tells me she will advise pt this is our recommendation also.

## 2017-04-30 NOTE — Telephone Encounter (Signed)
New Message  Pt c/o Shortness Of Breath: STAT if SOB developed within the last 24 hours or pt is noticeably SOB on the phone  1. Are you currently SOB (can you hear that pt is SOB on the phone)? Yes   2. How long have you been experiencing SOB? Before last appt with PA   3. Are you SOB when sitting or when up moving around? Moving around   4. Are you currently experiencing any other symptoms? HA 36-40 and bp is 88/60 O2 stats 94%

## 2017-05-05 ENCOUNTER — Ambulatory Visit (INDEPENDENT_AMBULATORY_CARE_PROVIDER_SITE_OTHER): Payer: Medicare Other | Admitting: Podiatry

## 2017-05-05 ENCOUNTER — Encounter: Payer: Self-pay | Admitting: Podiatry

## 2017-05-05 DIAGNOSIS — M79674 Pain in right toe(s): Secondary | ICD-10-CM | POA: Diagnosis not present

## 2017-05-05 DIAGNOSIS — B351 Tinea unguium: Secondary | ICD-10-CM | POA: Diagnosis not present

## 2017-05-05 DIAGNOSIS — M79675 Pain in left toe(s): Secondary | ICD-10-CM

## 2017-05-05 NOTE — Progress Notes (Signed)
Subjective: 83 y.o. returns the office today for painful, elongated, thickened toenails which she cannot trim herself. Denies any redness or drainage around the nails. Denies any acute changes since last appointment and no new complaints today.  She's been having shortness of breath and she's been following with cardiology. She states that she still having breathing problems in her medications been adjusted and she is doing a little better. Denies any chest pain.  Objective: AAO 3, NAD DP/PT pulses palpable, CRT less than 3 seconds  Nails hypertrophic, dystrophic, elongated, brittle, discolored 10. There is tenderness overlying the nails 1-5 bilaterally. There is no surrounding erythema or drainage along the nail sites. On the right hallux toenail does appear to be some old blood on the medial corner there is no erythema, drainage or pus or any clinical signs of infection. Multiple digital deformities. Severe HAV on the left and hammetoes b/l.  No open lesions or pre-ulcerative lesions are identified. No other areas of tenderness bilateral lower extremities. No overlying edema, erythema, increased warmth. No pain with calf compression, swelling, warmth, erythema.  Assessment: Patient presents with symptomatic onychomycosis; ingrown toenails  Plan: -Treatment options including alternatives, risks, complications were discussed -Nails sharply debrided 10 without complication/bleeding. -Discussed daily foot inspection. If there are any changes, to call the office immediately.  -Follow up with cardiology, if any worsening to call cardiology or go to the ER.  -Follow-up in 9 weeks or sooner if any problems are to arise. In the meantime, encouraged to call the office with any questions, concerns, changes symptoms.  Celesta Gentile, DPM

## 2017-05-19 ENCOUNTER — Ambulatory Visit: Payer: Medicare Other | Admitting: Physician Assistant

## 2017-05-25 ENCOUNTER — Inpatient Hospital Stay (HOSPITAL_COMMUNITY)
Admission: EM | Admit: 2017-05-25 | Discharge: 2017-05-29 | DRG: 064 | Disposition: A | Discharge status: Skilled Nursing Facility | Payer: Medicare Other | Attending: Internal Medicine | Admitting: Internal Medicine

## 2017-05-25 ENCOUNTER — Encounter (HOSPITAL_COMMUNITY): Payer: Self-pay | Admitting: Emergency Medicine

## 2017-05-25 ENCOUNTER — Emergency Department (HOSPITAL_COMMUNITY): Payer: Medicare Other

## 2017-05-25 ENCOUNTER — Other Ambulatory Visit: Payer: Self-pay

## 2017-05-25 DIAGNOSIS — R4701 Aphasia: Secondary | ICD-10-CM | POA: Diagnosis present

## 2017-05-25 DIAGNOSIS — E785 Hyperlipidemia, unspecified: Secondary | ICD-10-CM | POA: Diagnosis present

## 2017-05-25 DIAGNOSIS — M549 Dorsalgia, unspecified: Secondary | ICD-10-CM | POA: Diagnosis present

## 2017-05-25 DIAGNOSIS — D649 Anemia, unspecified: Secondary | ICD-10-CM

## 2017-05-25 DIAGNOSIS — K922 Gastrointestinal hemorrhage, unspecified: Secondary | ICD-10-CM

## 2017-05-25 DIAGNOSIS — Z85038 Personal history of other malignant neoplasm of large intestine: Secondary | ICD-10-CM | POA: Diagnosis not present

## 2017-05-25 DIAGNOSIS — I959 Hypotension, unspecified: Secondary | ICD-10-CM | POA: Diagnosis present

## 2017-05-25 DIAGNOSIS — Z9049 Acquired absence of other specified parts of digestive tract: Secondary | ICD-10-CM

## 2017-05-25 DIAGNOSIS — K449 Diaphragmatic hernia without obstruction or gangrene: Secondary | ICD-10-CM | POA: Diagnosis present

## 2017-05-25 DIAGNOSIS — I5032 Chronic diastolic (congestive) heart failure: Secondary | ICD-10-CM | POA: Diagnosis present

## 2017-05-25 DIAGNOSIS — Z888 Allergy status to other drugs, medicaments and biological substances status: Secondary | ICD-10-CM

## 2017-05-25 DIAGNOSIS — H9190 Unspecified hearing loss, unspecified ear: Secondary | ICD-10-CM | POA: Diagnosis present

## 2017-05-25 DIAGNOSIS — Z8673 Personal history of transient ischemic attack (TIA), and cerebral infarction without residual deficits: Secondary | ICD-10-CM | POA: Diagnosis not present

## 2017-05-25 DIAGNOSIS — F419 Anxiety disorder, unspecified: Secondary | ICD-10-CM | POA: Diagnosis present

## 2017-05-25 DIAGNOSIS — Z9181 History of falling: Secondary | ICD-10-CM | POA: Diagnosis not present

## 2017-05-25 DIAGNOSIS — I480 Paroxysmal atrial fibrillation: Secondary | ICD-10-CM | POA: Diagnosis present

## 2017-05-25 DIAGNOSIS — Z66 Do not resuscitate: Secondary | ICD-10-CM | POA: Diagnosis present

## 2017-05-25 DIAGNOSIS — K921 Melena: Secondary | ICD-10-CM

## 2017-05-25 DIAGNOSIS — E860 Dehydration: Secondary | ICD-10-CM | POA: Diagnosis present

## 2017-05-25 DIAGNOSIS — K5900 Constipation, unspecified: Secondary | ICD-10-CM | POA: Diagnosis not present

## 2017-05-25 DIAGNOSIS — I63542 Cerebral infarction due to unspecified occlusion or stenosis of left cerebellar artery: Secondary | ICD-10-CM | POA: Diagnosis present

## 2017-05-25 DIAGNOSIS — I272 Pulmonary hypertension, unspecified: Secondary | ICD-10-CM | POA: Diagnosis present

## 2017-05-25 DIAGNOSIS — D62 Acute posthemorrhagic anemia: Secondary | ICD-10-CM | POA: Diagnosis present

## 2017-05-25 DIAGNOSIS — K264 Chronic or unspecified duodenal ulcer with hemorrhage: Secondary | ICD-10-CM | POA: Diagnosis present

## 2017-05-25 DIAGNOSIS — I482 Chronic atrial fibrillation: Secondary | ICD-10-CM | POA: Diagnosis present

## 2017-05-25 DIAGNOSIS — F329 Major depressive disorder, single episode, unspecified: Secondary | ICD-10-CM | POA: Diagnosis present

## 2017-05-25 DIAGNOSIS — I110 Hypertensive heart disease with heart failure: Secondary | ICD-10-CM | POA: Diagnosis present

## 2017-05-25 DIAGNOSIS — F0390 Unspecified dementia without behavioral disturbance: Secondary | ICD-10-CM | POA: Diagnosis present

## 2017-05-25 DIAGNOSIS — G459 Transient cerebral ischemic attack, unspecified: Secondary | ICD-10-CM

## 2017-05-25 DIAGNOSIS — Z7982 Long term (current) use of aspirin: Secondary | ICD-10-CM

## 2017-05-25 DIAGNOSIS — M353 Polymyalgia rheumatica: Secondary | ICD-10-CM | POA: Diagnosis present

## 2017-05-25 DIAGNOSIS — K209 Esophagitis, unspecified: Secondary | ICD-10-CM | POA: Diagnosis present

## 2017-05-25 DIAGNOSIS — K2270 Barrett's esophagus without dysplasia: Secondary | ICD-10-CM | POA: Diagnosis present

## 2017-05-25 DIAGNOSIS — Z7952 Long term (current) use of systemic steroids: Secondary | ICD-10-CM

## 2017-05-25 DIAGNOSIS — I4891 Unspecified atrial fibrillation: Secondary | ICD-10-CM | POA: Diagnosis not present

## 2017-05-25 DIAGNOSIS — Z91018 Allergy to other foods: Secondary | ICD-10-CM

## 2017-05-25 DIAGNOSIS — I361 Nonrheumatic tricuspid (valve) insufficiency: Secondary | ICD-10-CM | POA: Diagnosis not present

## 2017-05-25 HISTORY — DX: Personal history of transient ischemic attack (TIA), and cerebral infarction without residual deficits: Z86.73

## 2017-05-25 HISTORY — DX: Transient cerebral ischemic attack, unspecified: G45.9

## 2017-05-25 HISTORY — DX: Gastrointestinal hemorrhage, unspecified: K92.2

## 2017-05-25 HISTORY — DX: Anemia, unspecified: D64.9

## 2017-05-25 LAB — DIFFERENTIAL
Basophils Absolute: 0 10*3/uL (ref 0.0–0.1)
Basophils Relative: 0 %
EOS ABS: 0.1 10*3/uL (ref 0.0–0.7)
EOS PCT: 1 %
LYMPHS ABS: 0.8 10*3/uL (ref 0.7–4.0)
Lymphocytes Relative: 7 %
MONOS PCT: 7 %
Monocytes Absolute: 0.8 10*3/uL (ref 0.1–1.0)
NEUTROS PCT: 85 %
Neutro Abs: 9.6 10*3/uL — ABNORMAL HIGH (ref 1.7–7.7)

## 2017-05-25 LAB — CBC WITH DIFFERENTIAL/PLATELET
BASOS ABS: 0 10*3/uL (ref 0.0–0.1)
BASOS PCT: 0 %
EOS ABS: 0 10*3/uL (ref 0.0–0.7)
Eosinophils Relative: 0 %
HEMATOCRIT: 20.8 % — AB (ref 36.0–46.0)
HEMOGLOBIN: 6.6 g/dL — AB (ref 12.0–15.0)
Lymphocytes Relative: 11 %
Lymphs Abs: 1.3 10*3/uL (ref 0.7–4.0)
MCH: 33 pg (ref 26.0–34.0)
MCHC: 31.7 g/dL (ref 30.0–36.0)
MCV: 104 fL — ABNORMAL HIGH (ref 78.0–100.0)
Monocytes Absolute: 0.8 10*3/uL (ref 0.1–1.0)
Monocytes Relative: 7 %
NEUTROS ABS: 9.4 10*3/uL — AB (ref 1.7–7.7)
NEUTROS PCT: 82 %
Platelets: 201 10*3/uL (ref 150–400)
RBC: 2 MIL/uL — ABNORMAL LOW (ref 3.87–5.11)
RDW: 18.5 % — ABNORMAL HIGH (ref 11.5–15.5)
WBC: 11.5 10*3/uL — AB (ref 4.0–10.5)

## 2017-05-25 LAB — PROTIME-INR
INR: 1.18
PROTHROMBIN TIME: 14.9 s (ref 11.4–15.2)

## 2017-05-25 LAB — CBC
HCT: 27.2 % — ABNORMAL LOW (ref 36.0–46.0)
HEMATOCRIT: 21.7 % — AB (ref 36.0–46.0)
Hemoglobin: 6.8 g/dL — CL (ref 12.0–15.0)
Hemoglobin: 9.2 g/dL — ABNORMAL LOW (ref 12.0–15.0)
MCH: 32.5 pg (ref 26.0–34.0)
MCH: 33.9 pg (ref 26.0–34.0)
MCHC: 31.3 g/dL (ref 30.0–36.0)
MCHC: 33.8 g/dL (ref 30.0–36.0)
MCV: 103.8 fL — ABNORMAL HIGH (ref 78.0–100.0)
MCV: 100.4 fL — ABNORMAL HIGH (ref 78.0–100.0)
PLATELETS: 216 10*3/uL (ref 150–400)
Platelets: 179 10*3/uL (ref 150–400)
RBC: 2.09 MIL/uL — ABNORMAL LOW (ref 3.87–5.11)
RBC: 2.71 MIL/uL — ABNORMAL LOW (ref 3.87–5.11)
RDW: 18.7 % — AB (ref 11.5–15.5)
RDW: 18.1 % — ABNORMAL HIGH (ref 11.5–15.5)
WBC: 12.5 10*3/uL — AB (ref 4.0–10.5)
WBC: 12.4 10*3/uL — ABNORMAL HIGH (ref 4.0–10.5)

## 2017-05-25 LAB — COMPREHENSIVE METABOLIC PANEL
ALT: 19 U/L (ref 14–54)
ALT: 19 U/L (ref 14–54)
AST: 30 U/L (ref 15–41)
AST: 31 U/L (ref 15–41)
Albumin: 2.5 g/dL — ABNORMAL LOW (ref 3.5–5.0)
Albumin: 2.5 g/dL — ABNORMAL LOW (ref 3.5–5.0)
Alkaline Phosphatase: 73 U/L (ref 38–126)
Alkaline Phosphatase: 70 U/L (ref 38–126)
Anion gap: 9 (ref 5–15)
Anion gap: 6 (ref 5–15)
BILIRUBIN TOTAL: 0.5 mg/dL (ref 0.3–1.2)
BUN: 33 mg/dL — AB (ref 6–20)
BUN: 29 mg/dL — ABNORMAL HIGH (ref 6–20)
CO2: 21 mmol/L — ABNORMAL LOW (ref 22–32)
CO2: 21 mmol/L — ABNORMAL LOW (ref 22–32)
Calcium: 8.1 mg/dL — ABNORMAL LOW (ref 8.9–10.3)
Calcium: 7.9 mg/dL — ABNORMAL LOW (ref 8.9–10.3)
Chloride: 111 mmol/L (ref 101–111)
Chloride: 111 mmol/L (ref 101–111)
Creatinine, Ser: 0.73 mg/dL (ref 0.44–1.00)
Creatinine, Ser: 0.64 mg/dL (ref 0.44–1.00)
GFR calc Af Amer: >60 mL/min (ref >60)
GFR calc non Af Amer: >60 mL/min (ref >60)
Glucose, Bld: 153 mg/dL — ABNORMAL HIGH (ref 65–99)
Glucose, Bld: 120 mg/dL — ABNORMAL HIGH (ref 65–99)
Potassium: 3.9 mmol/L (ref 3.5–5.1)
Potassium: 4.3 mmol/L (ref 3.5–5.1)
Sodium: 141 mmol/L (ref 135–145)
Sodium: 138 mmol/L (ref 135–145)
TOTAL PROTEIN: 4.7 g/dL — AB (ref 6.5–8.1)
Total Bilirubin: 3 mg/dL — ABNORMAL HIGH (ref 0.3–1.2)
Total Protein: 4.6 g/dL — ABNORMAL LOW (ref 6.5–8.1)

## 2017-05-25 LAB — PHOSPHORUS: Phosphorus: 3 mg/dL (ref 2.5–4.6)

## 2017-05-25 LAB — URINALYSIS, ROUTINE W REFLEX MICROSCOPIC
Bilirubin Urine: NEGATIVE
Glucose, UA: NEGATIVE mg/dL
Hgb urine dipstick: NEGATIVE
KETONES UR: NEGATIVE mg/dL
Leukocytes, UA: NEGATIVE
Nitrite: NEGATIVE
PH: 5 (ref 5.0–8.0)
Protein, ur: NEGATIVE mg/dL
Specific Gravity, Urine: 1.021 (ref 1.005–1.030)

## 2017-05-25 LAB — CBG MONITORING, ED: Glucose-Capillary: 150 mg/dL — ABNORMAL HIGH (ref 65–99)

## 2017-05-25 LAB — PREPARE RBC (CROSSMATCH)

## 2017-05-25 LAB — I-STAT TROPONIN, ED: Troponin i, poc: 0.01 ng/mL (ref 0.00–0.08)

## 2017-05-25 LAB — MAGNESIUM: Magnesium: 2.3 mg/dL (ref 1.7–2.4)

## 2017-05-25 LAB — APTT: aPTT: 26 seconds (ref 24–36)

## 2017-05-25 LAB — POC OCCULT BLOOD, ED: FECAL OCCULT BLD: POSITIVE — AB

## 2017-05-25 LAB — ABO/RH: ABO/RH(D): O NEG

## 2017-05-25 MED ORDER — ONDANSETRON HCL 4 MG/2ML IJ SOLN: 4.0000 mg | Freq: Four times a day (QID) | INTRAMUSCULAR | Status: DC | PRN

## 2017-05-25 MED ORDER — ASPIRIN 300 MG RE SUPP: 300.0000 mg | Freq: Every day | RECTAL | Status: DC

## 2017-05-25 MED ORDER — SODIUM CHLORIDE 0.9 % IV SOLN
80.0000 mg | Freq: Once | INTRAVENOUS | Status: AC
  Administered 2017-05-25: 80 mg via INTRAVENOUS
  Filled 2017-05-25: qty 80

## 2017-05-25 MED ORDER — SUCRALFATE 1 GM/10ML PO SUSP
1.0000 g | Freq: Three times a day (TID) | ORAL | Status: AC
  Administered 2017-05-25 (×3): 1 g via ORAL
  Filled 2017-05-25 (×3): qty 10

## 2017-05-25 MED ORDER — SODIUM CHLORIDE 0.9 % IV SOLN
10.0000 mL/h | Freq: Once | INTRAVENOUS | Status: AC
  Administered 2017-05-25: 10 mL/h via INTRAVENOUS

## 2017-05-25 MED ORDER — SODIUM CHLORIDE 0.9 % IV SOLN
INTRAVENOUS | Status: DC
  Administered 2017-05-25 – 2017-05-27 (×3): via INTRAVENOUS

## 2017-05-25 MED ORDER — LACTATED RINGERS IV SOLN
INTRAVENOUS | Status: AC
  Administered 2017-05-25: 12:00:00 via INTRAVENOUS

## 2017-05-25 MED ORDER — ONDANSETRON HCL 4 MG PO TABS: 4.0000 mg | ORAL_TABLET | Freq: Four times a day (QID) | ORAL | Status: DC | PRN

## 2017-05-25 MED ORDER — PREDNISONE 5 MG PO TABS
5.0000 mg | ORAL_TABLET | Freq: Every day | ORAL | Status: DC
  Administered 2017-05-25 – 2017-05-29 (×5): 5 mg via ORAL
  Filled 2017-05-25 (×6): qty 1

## 2017-05-25 MED ORDER — ACETAMINOPHEN 650 MG RE SUPP: 650.0000 mg | Freq: Four times a day (QID) | RECTAL | Status: DC | PRN

## 2017-05-25 MED ORDER — METOPROLOL SUCCINATE ER 25 MG PO TB24
50.0000 mg | ORAL_TABLET | Freq: Every day | ORAL | Status: DC
  Administered 2017-05-25 – 2017-05-29 (×5): 50 mg via ORAL
  Filled 2017-05-25 (×2): qty 2
  Filled 2017-05-25: qty 1
  Filled 2017-05-25 (×2): qty 2

## 2017-05-25 MED ORDER — STROKE: EARLY STAGES OF RECOVERY BOOK
Freq: Once | Status: AC
  Administered 2017-05-25: 19:00:00
  Filled 2017-05-25: qty 1

## 2017-05-25 MED ORDER — DILTIAZEM HCL 100 MG IV SOLR
5.0000 mg/h | INTRAVENOUS | Status: DC
  Administered 2017-05-25: 5 mg/h via INTRAVENOUS
  Filled 2017-05-25: qty 100

## 2017-05-25 MED ORDER — BUSPIRONE HCL 10 MG PO TABS
5.0000 mg | ORAL_TABLET | Freq: Every day | ORAL | Status: DC
  Administered 2017-05-25 – 2017-05-29 (×5): 5 mg via ORAL
  Filled 2017-05-25 (×5): qty 1

## 2017-05-25 MED ORDER — SODIUM CHLORIDE 0.9 % IV SOLN
8.0000 mg/h | INTRAVENOUS | Status: AC
  Administered 2017-05-25 – 2017-05-28 (×6): 8 mg/h via INTRAVENOUS
  Filled 2017-05-25 (×9): qty 80

## 2017-05-25 MED ORDER — ACETAMINOPHEN 500 MG PO TABS: 1000.0000 mg | ORAL_TABLET | Freq: Four times a day (QID) | ORAL | Status: DC | PRN

## 2017-05-25 MED ORDER — PANTOPRAZOLE SODIUM 40 MG IV SOLR: 40.0000 mg | Freq: Two times a day (BID) | INTRAVENOUS | Status: DC

## 2017-05-25 MED ORDER — ASPIRIN EC 325 MG PO TBEC
325.0000 mg | DELAYED_RELEASE_TABLET | Freq: Every day | ORAL | Status: DC
  Administered 2017-05-25 – 2017-05-27 (×3): 325 mg via ORAL
  Filled 2017-05-25 (×3): qty 1

## 2017-05-25 MED ORDER — DILTIAZEM HCL ER COATED BEADS 180 MG PO CP24
180.0000 mg | ORAL_CAPSULE | Freq: Every day | ORAL | Status: DC
  Administered 2017-05-25 – 2017-05-29 (×5): 180 mg via ORAL
  Filled 2017-05-25 (×5): qty 1

## 2017-05-25 MED ORDER — LACTATED RINGERS IV SOLN
INTRAVENOUS | Status: DC
  Administered 2017-05-25: 08:00:00 via INTRAVENOUS

## 2017-05-25 NOTE — Consult Note (Addendum)
Requesting Physician: Dr. Saralyn Pilar    Chief Complaint: Stroke [name]  History obtained from:  Patient and daughter HPI:                                                                                                                                         Krystal Hensley is an 83 y.o. female with known A. fib on aspirin instead of anticoagulation secondary to being a fall risk. Patient has not been diagnosed with dementia however per daughter likely has neurocognitive decline at baseline.  Last night patient was last seen normal at 2000 hrs. woke up this morning at around 4 AM and was noticed to have a hard time expressing herself.  Patient was aware that she was having a hard time expressed herself and told her daughter that she noted something was wrong.  There was no weakness numbness or tingling that was noted.  Per daughter by the time EMS came patient was starting to come back to baseline.  Currently she is back to baseline.  She is confused however as said prior there is likely some neurocognitive decline at baseline.  She is able to follow all commands and speech is appropriate.  Date last known well: Date: 05/24/2017 Time last known well: Time: 20:00 tPA Given: No: Symptoms resolved  Modified Rankin: Rankin Score=3  Past Medical History:  Diagnosis Date  . Arthritis   . Back pain   . Chronic diastolic CHF (congestive heart failure) ([place])   . Colon cancer ([place])   . HTN (hypertension) 08/08/2013  . Hypertension   . Hypoglycemia   . PAF (paroxysmal atrial fibrillation) ([place])   . Polymyalgia rheumatica ([place])   . Pulmonary hypertension ([place])    a. Echo 07/2014 - mildly increased PASP.  Marland Kitchen Stroke [facility])     Past Surgical History:  Procedure Laterality Date  . BILATERAL OOPHORECTOMY    . CESAREAN SECTION    . COLON SURGERY    . EYE SURGERY      Family History  Problem Relation Age of Onset  . Aneurysm Mother   . Heart attack Father    Social History:  reports that  has never  smoked. she has never used smokeless tobacco. She reports that she does not drink alcohol or use drugs.  Allergies:  Allergies  Allergen Reactions  . Chocolate Other (See Comments)    Unknown  . Statins Other (See Comments)    myalgia    Medications:  Current Facility-Administered Medications  Medication Dose Route Frequency Provider Last Rate Last Dose  . diltiazem (CARDIZEM) 100 mg in dextrose 5 % 100 mL (1 mg/mL) infusion  5-15 mg/hr Intravenous Continuous Varney Biles, MD 5 mL/hr at 05/25/17 0935 5 mg/hr at 05/25/17 0935  . lactated ringers infusion   Intravenous Continuous Varney Biles, MD 125 mL/hr at 05/25/17 0807    . pantoprazole (PROTONIX) 80 mg in sodium chloride 0.9 % 250 mL (0.32 mg/mL) infusion  8 mg/hr Intravenous Continuous Varney Biles, MD 25 mL/hr at 05/25/17 0929 8 mg/hr at 05/25/17 0929  . [START ON 05/28/2017] pantoprazole (PROTONIX) injection 40 mg  40 mg Intravenous Q12H Varney Biles, MD       Current Outpatient Medications  Medication Sig Dispense Refill  . acetaminophen (TYLENOL) 325 MG tablet Take 325 mg every 6 (six) hours as needed by mouth for moderate pain.     Marland Kitchen aspirin 81 MG chewable tablet Chew 81 mg by mouth daily.    . busPIRone (BUSPAR) 5 MG tablet Take 5 mg by mouth daily.   1  . diltiazem (CARDIZEM CD) 180 MG 24 hr capsule Take 1 capsule (180 mg total) by mouth daily. 30 capsule 0  . furosemide (LASIX) 20 MG tablet Take 20 mg by mouth daily.    . metoprolol succinate (TOPROL-XL) 50 MG 24 hr tablet take 1 tablet by mouth once daily with food or IMMEDIATELY FOLLOWING A MEAL 90 tablet 1  . Multiple Vitamins-Minerals (VISION FORMULA/LUTEIN PO) Take 1 tablet by mouth 2 (two) times daily.     Marland Kitchen omeprazole (PRILOSEC) 20 MG capsule Take 20 mg by mouth daily.    . polyethylene glycol (MIRALAX / GLYCOLAX) packet Take 17 g by mouth  daily.    . predniSONE (DELTASONE) 5 MG tablet Take 5 mg by mouth daily.   0  . sennosides-docusate sodium (SENOKOT-S) 8.6-50 MG tablet Take 1 tablet by mouth daily as needed for constipation.     . silver sulfADIAZINE (SILVADENE) 1 % cream Apply 1 application topically daily. (Patient not taking: Reported on 05/25/2017) 50 g 0  . traMADol (ULTRAM) 50 MG tablet Take 1 tablet (50 mg total) by mouth every 6 (six) hours as needed. (Patient not taking: Reported on 05/25/2017) 15 tablet 0    ROS:                                                                                                                                       History obtained from unobtainable from patient due to mental status  General Examination:  Blood pressure (!) 92/52, pulse (!) 118, resp. rate (!) 27, height 5\' 1"  (1.549 m), weight 49.9 kg (110 lb), SpO2 96 %.  HEENT-  Normocephalic, no lesions, without obvious abnormality.  Normal external eye and conjunctiva.  Normal TM's bilaterally.  Normal auditory canals and external ears. Normal external nose, mucus membranes and septum.  Normal pharynx. Cardiovascular- irregularly irregular rhythm, pulses palpable throughout   Lungs- chest clear, no wheezing, rales, normal symmetric air entry Abdomen- normal findings: bowel sounds normal Extremities- no edema Lymph-no adenopathy palpable Musculoskeletal-no joint tenderness, deformity or swelling Skin-warm and dry, no hyperpigmentation, vitiligo, or suspicious lesions  Neurological Examination Mental Status: Alert, oriented to hospital, is aware that it is the month of Thanksgiving but continues to state it is January.  Does not know the year.  Is able to follow commands.  Speech fluent without evidence of aphasia.   Cranial Nerves: II: ; Visual fields grossly normal,  III,IV, VI: ptosis present on the right, extra-ocular motions  intact bilaterally, pupils irregular secondary to surgery with the right pupil being 3 mm and left pupil being 2 mm minimally reactive to light bilaterally V,VII: smile symmetric, facial light touch sensation normal bilaterally VIII: hearing normal bilaterally IX,X: uvula rises symmetrically XI: bilateral shoulder shrug XII: midline tongue extension Motor: Right : Upper extremity   4/5    Left:     Upper extremity   4/5  Lower extremity   5/5     Lower extremity   5/5 Tone and bulk:normal tone throughout; no atrophy noted Sensory: Pinprick and light touch intact throughout, bilaterally Deep Tendon Reflexes: Hyporeflexic throughout Plantars: Mute bilaterally Cerebellar: Difficult to test secondary to weakness. Finger-nose testing did not show any significant dysmetria. She did have a tremor and demonstrated weakness of upper extremities as she got to her nose Gait: Not tested  Lab Results: Basic Metabolic Panel: Recent Labs  Lab 05/25/17 0720  NA 141  K 3.9  CL 111  CO2 21*  GLUCOSE 153*  BUN 33*  CREATININE 0.73  CALCIUM 8.1*  MG 2.3    Liver Function Tests: Recent Labs  Lab 05/25/17 0720  AST 30  ALT 19  ALKPHOS 73  BILITOT 0.5  PROT 4.7*  ALBUMIN 2.5*   No results for input(s): LIPASE, AMYLASE in the last 168 hours. No results for input(s): AMMONIA in the last 168 hours.  CBC: Recent Labs  Lab 05/25/17 0720  WBC 12.5*  NEUTROABS 9.6*  HGB 6.8*  HCT 21.7*  MCV 103.8*  PLT 216    Cardiac Enzymes: No results for input(s): CKTOTAL, CKMB, CKMBINDEX, TROPONINI in the last 168 hours.  Lipid Panel: No results for input(s): CHOL, TRIG, HDL, CHOLHDL, VLDL, LDLCALC in the last 168 hours.  CBG: Recent Labs  Lab 05/25/17 0714  GLUCAP 150*    Microbiology: No results found for this or any previous visit.  Coagulation Studies: Recent Labs    05/25/17 0720  LABPROT 14.9  INR 1.18    Imaging: Ct Head Wo Contrast  Result Date: 05/25/2017 CLINICAL  DATA:  Altered mental status. EXAM: CT HEAD WITHOUT CONTRAST TECHNIQUE: Contiguous axial images were obtained from the base of the skull through the vertex without intravenous contrast. COMPARISON:  CT scan of December 07, 2014. FINDINGS: Brain: Mild diffuse cortical atrophy is noted. Mild chronic ischemic white matter disease is noted. No mass effect or midline shift is noted. Ventricular size is within normal limits. There is no evidence of mass lesion, hemorrhage or acute  infarction. Vascular: No hyperdense vessel or unexpected calcification. Skull: Normal. Negative for fracture or focal lesion. Sinuses/Orbits: Mild sphenoid sinusitis is noted. Other: None. IMPRESSION: Mild diffuse cortical atrophy. Mild chronic ischemic white matter disease. No acute intracranial abnormality seen. Electronically Signed   By: Marijo Conception, M.D.   On: 05/25/2017 08:51   Dg Chest Port 1 View  Result Date: 05/25/2017 CLINICAL DATA:  Atrial fibrillation EXAM: PORTABLE CHEST 1 VIEW COMPARISON:  11/10/2016 FINDINGS: Cardiomegaly with vascular congestion. Left lower lobe atelectasis or infiltrate. Right lung is clear. No acute bony abnormality. Advanced degenerative changes in the shoulders. IMPRESSION: Cardiomegaly with vascular congestion. Left lower lobe atelectasis or infiltrate. This is similar to prior study. Electronically Signed   By: Rolm Baptise M.D.   On: 05/25/2017 09:13    Assessment and plan discussed with with attending physician and they are in agreement.    Etta Quill PA-C [facility] [phone]  05/25/2017, 10:12 AM   Assessment: 83 y.o. female with transient expressive aphasia versus confusion.  1. No receptive or expressive aphasia on current Neurological exam. DDx for her presentation includes a left cortical infarction or TIA secondary to cardioembolic phenomenon. Also on DDx is transient confusion secondary to dehydration or transient hypoxemia given tachypnea noted on exam. 2. CT  head reveals no acute abnormality. Mild diffuse cortical atrophy and mild chronic ischemic white matter Disease noted.  3. Stroke Risk Factors - atrial fibrillation and hypertension  4. Elevated BUN/Cr ratio, suggestive of volume depletion. Oral mucosa also somewhat dry on exam 5. Shortness of breath. DDx includes CHF exacerbation.   Recommendations: 1. HgbA1c, fasting lipid panel 2. MRI/MRA of the brain without contrast 3. PT consult, OT consult, Speech consult 4. Echocardiogram and carotid ultrasound 5. Has statin allergy.  6. Prophylactic therapy - Antiplatelet med: Continue aspirin.  Patient is a fall risk thus is not a candidate for anticoagulation 7. Risk factor modification 8. Telemetry monitoring 9. Frequent neuro checks 10 NPO until passes stroke swallow screen 11. Hypotension.  12. Agree with volume resuscitation, but this should be undertaken with caution, close monitoring of vitals and frequent cardiovascular assessments. Monitor closely for possible development of signs/symptoms of CHF exacerbation.  13 please page stroke NP  Or  PA  Or MD from 8am -4 pm  as this patient from this time will be  followed by the stroke.   You can look them up on [email or web address]  Password [email or web address]   Electronically signed: Dr. Kerney Elbe

## 2017-05-25 NOTE — ED Notes (Signed)
Neurology consult in room at this time.

## 2017-05-25 NOTE — ED Triage Notes (Signed)
PT [facility] BIB for altered mental status. Pt is answering question appropriately, knew she was confused. Fire department stated the patinets urine smelled malodorous. Pt c/o of hard time urinating

## 2017-05-25 NOTE — ED Notes (Signed)
Previous RN informed this RN that provider requested we complete labs after 2nd unit of blood transfused.  Call provider w/ results to decide if 3rd unit will be given.

## 2017-05-25 NOTE — ED Notes (Signed)
Pt transported to CT with RN

## 2017-05-25 NOTE — ED Notes (Signed)
Hemoglobin 6.8 MD notified.

## 2017-05-25 NOTE — ED Notes (Signed)
Pt returned from CT °

## 2017-05-25 NOTE — ED Notes (Signed)
ED Provider at bedside. 

## 2017-05-25 NOTE — ED Notes (Signed)
Attempted to call report

## 2017-05-25 NOTE — ED Provider Notes (Signed)
[facility] EMERGENCY DEPARTMENT Provider Note   CSN: [id number] Arrival date & time: 05/25/17  6295     History   Chief Complaint Chief Complaint  Patient presents with  . Altered Mental Status    HPI Krystal Hensley is a 83 y.o. female.  HPI 83 year old female with history of A. fib, chronic diastolic CHF, remote history of colon cancer, polymyalgia rheumatica, pulmonary hypertension comes in with chief complaint of difficulty expressing herself. Patient reports that she woke up around 5 AM this morning and she noted difficulty getting the right words out.  Patient's daughter was with her and appreciated the same.  Patient denied any associated numbness, tingling, focal weakness, and vision changes.  Patient has no history of strokes.  Over the course of a few minutes patients has noted improvement in her symptoms.  Patient confirms that at no point was she confused, she was well aware that she was having difficulty getting the right words out.  Patient denies any chest pain, shortness of breath, headache, abdominal pain, nausea, vomiting, new rashes.  Review of system is positive for dark tarry stools over the past few days and a large bowel movement yesterday.  Initial hemoglobin is noted to be 6.8.  Patient denies any history of GI bleed.  Review of system is now positive for increased fatigue over the past few days and some balance problems.  Patient takes baby aspirin every day, no anticoagulation on board.  Patient is also on chronic prednisone.  Past Medical History:  Diagnosis Date  . Arthritis   . Back pain   . Chronic diastolic CHF (congestive heart failure) ([place])   . Colon cancer ([place])   . HTN (hypertension) 08/08/2013  . Hypertension   . Hypoglycemia   . PAF (paroxysmal atrial fibrillation) ([place])   . Polymyalgia rheumatica ([place])   . Pulmonary hypertension ([place])    a. Echo 07/2014 - mildly increased PASP.  Marland Kitchen Stroke [facility])     Patient Active  Problem List   Diagnosis Date Noted  . TIA (transient ischemic attack) 05/25/2017  . History of CVA (cerebrovascular accident) 05/25/2017  . Symptomatic anemia 05/25/2017  . Upper GI bleed 05/25/2017  . Right knee injury, subsequent encounter 01/14/2017  . Tachycardia 01/14/2017  . Shortness of breath 11/09/2016  . Hyponatremia 11/22/2015  . Acute on chronic diastolic CHF (congestive heart failure), NYHA class 2 ([place]) 02/15/2015  . Dyspnea 07/24/2014  . Abdominal pain, right upper quadrant 07/24/2014  . Chronic diastolic CHF (congestive heart failure) ([place]) 07/24/2014  . Atrial fibrillation with RVR ([place]) 06/14/2014  . Constipation 04/03/2014  . Pulmonary hypertension ([place]) 02/02/2014  . Chest pain 01/14/2014  . Cerebral infarction ([place]) 08/08/2013  . RUE weakness 08/08/2013  . HTN (hypertension) 08/08/2013  . PMR (polymyalgia rheumatica) ([facility]) 08/08/2013  . Spastic hemiplegia affecting dominant side ([place]) 08/08/2013  . Wound of left leg 08/08/2013  . Hx of PAF- CHADS VASC=7 08/08/2013    Past Surgical History:  Procedure Laterality Date  . BILATERAL OOPHORECTOMY    . CESAREAN SECTION    . COLON SURGERY    . EYE SURGERY      OB History    No data available       Home Medications    Prior to Admission medications   Medication Sig Start Date End Date Taking? Authorizing Provider  acetaminophen (TYLENOL) 325 MG tablet Take 325 mg every 6 (six) hours as needed by mouth for moderate pain.  Yes [provider]  aspirin 81 MG chewable tablet Chew 81 mg by mouth daily.   Yes [provider]  busPIRone (BUSPAR) 5 MG tablet Take 5 mg by mouth daily.  04/16/17  Yes [provider]  diltiazem (CARDIZEM CD) 180 MG 24 hr capsule Take 1 capsule (180 mg total) by mouth daily. 11/12/16  Yes Thurnell Lose, MD  furosemide (LASIX) 20 MG tablet Take 20 mg by mouth daily.   Yes [provider]  metoprolol succinate (TOPROL-XL) 50 MG 24 hr tablet  take 1 tablet by mouth once daily with food or IMMEDIATELY FOLLOWING A MEAL 03/19/17  Yes Dorothy Spark, MD  Multiple Vitamins-Minerals (VISION FORMULA/LUTEIN PO) Take 1 tablet by mouth 2 (two) times daily.    Yes [provider]  omeprazole (PRILOSEC) 20 MG capsule Take 20 mg by mouth daily.   Yes [provider]  polyethylene glycol (MIRALAX / GLYCOLAX) packet Take 17 g by mouth daily.   Yes [provider]  predniSONE (DELTASONE) 5 MG tablet Take 5 mg by mouth daily.  04/15/17  Yes [provider]  sennosides-docusate sodium (SENOKOT-S) 8.6-50 MG tablet Take 1 tablet by mouth daily as needed for constipation.    Yes [provider]  silver sulfADIAZINE (SILVADENE) 1 % cream Apply 1 application topically daily. Patient not taking: Reported on 05/25/2017 04/30/16   Trula Slade, DPM  traMADol (ULTRAM) 50 MG tablet Take 1 tablet (50 mg total) by mouth every 6 (six) hours as needed. Patient not taking: Reported on 05/25/2017 01/07/17   Fredia Sorrow, MD    Family History Family History  Problem Relation Age of Onset  . Aneurysm Mother   . Heart attack Father     Social History Social History   Tobacco Use  . Smoking status: Never Smoker  . Smokeless tobacco: Never Used  Substance Use Topics  . Alcohol use: No  . Drug use: No     Allergies   Chocolate and Statins   Review of Systems Review of Systems  Constitutional: Positive for activity change and fatigue.  Allergic/Immunologic: Positive for immunocompromised state.  Neurological: Positive for dizziness and speech difficulty.  Hematological: Does not bruise/bleed easily.  Psychiatric/Behavioral: Negative for confusion.  All other systems reviewed and are negative.    Physical Exam Updated Vital Signs BP (!) 110/58   Pulse 91   Temp (!) 97.3 F (36.3 C) (Oral)   Resp (!) 24   Ht 5\' 1"  (1.549 m)   Wt 49.9 kg (110 lb)   SpO2 100%   BMI 20.78 kg/m    Physical Exam  Constitutional: She is oriented to person, place, and time. She appears well-developed.  HENT:  Head: Normocephalic and atraumatic.  Eyes: EOM are normal.  Neck: Normal range of motion. Neck supple. JVD present.  Cardiovascular:  Murmur heard. Irregular, tachycardia  Pulmonary/Chest: Effort normal. No respiratory distress.  Abdominal: Bowel sounds are normal. There is no tenderness.  Musculoskeletal: She exhibits no edema.  Neurological: She is alert and oriented to person, place, and time.  Skin: Skin is warm and dry. Rash noted.  Nursing note and vitals reviewed.    ED Treatments / Results  Labs (all labs ordered are listed, but only abnormal results are displayed) Labs Reviewed  COMPREHENSIVE METABOLIC PANEL - Abnormal; Notable for the following components:      Result Value   CO2 21 (*)    Glucose, Bld 153 (*)    BUN 33 (*)  Calcium 8.1 (*)    Total Protein 4.7 (*)    Albumin 2.5 (*)    All other components within normal limits  CBC - Abnormal; Notable for the following components:   WBC 12.5 (*)    RBC 2.09 (*)    Hemoglobin 6.8 (*)    HCT 21.7 (*)    MCV 103.8 (*)    RDW 18.7 (*)    All other components within normal limits  DIFFERENTIAL - Abnormal; Notable for the following components:   Neutro Abs 9.6 (*)    All other components within normal limits  CBG MONITORING, ED - Abnormal; Notable for the following components:   Glucose-Capillary 150 (*)    All other components within normal limits  POC OCCULT BLOOD, ED - Abnormal; Notable for the following components:   Fecal Occult Bld POSITIVE (*)    All other components within normal limits  PROTIME-INR  APTT  MAGNESIUM  URINALYSIS, ROUTINE W REFLEX MICROSCOPIC  MAGNESIUM  PHOSPHORUS  CBC WITH DIFFERENTIAL/PLATELET  I-STAT TROPONIN, ED  TYPE AND SCREEN  PREPARE RBC (CROSSMATCH)  ABO/RH    EKG  EKG Interpretation None     ED ECG REPORT   Date: 05/25/2017  Rate: 116  Rhythm:  atrial fibrillation  QRS Axis: normal  Intervals: normal  ST/T Wave abnormalities: nonspecific ST/T changes  Conduction Disutrbances:none  Narrative Interpretation:   Old EKG Reviewed: unchanged  I have personally reviewed the EKG tracing and agree with the computerized printout as noted.   Radiology Ct Head Wo Contrast  Result Date: 05/25/2017 CLINICAL DATA:  Altered mental status. EXAM: CT HEAD WITHOUT CONTRAST TECHNIQUE: Contiguous axial images were obtained from the base of the skull through the vertex without intravenous contrast. COMPARISON:  CT scan of December 07, 2014. FINDINGS: Brain: Mild diffuse cortical atrophy is noted. Mild chronic ischemic white matter disease is noted. No mass effect or midline shift is noted. Ventricular size is within normal limits. There is no evidence of mass lesion, hemorrhage or acute infarction. Vascular: No hyperdense vessel or unexpected calcification. Skull: Normal. Negative for fracture or focal lesion. Sinuses/Orbits: Mild sphenoid sinusitis is noted. Other: None. IMPRESSION: Mild diffuse cortical atrophy. Mild chronic ischemic white matter disease. No acute intracranial abnormality seen. Electronically Signed   By: Marijo Conception, M.D.   On: 05/25/2017 08:51   Dg Chest Port 1 View  Result Date: 05/25/2017 CLINICAL DATA:  Atrial fibrillation EXAM: PORTABLE CHEST 1 VIEW COMPARISON:  11/10/2016 FINDINGS: Cardiomegaly with vascular congestion. Left lower lobe atelectasis or infiltrate. Right lung is clear. No acute bony abnormality. Advanced degenerative changes in the shoulders. IMPRESSION: Cardiomegaly with vascular congestion. Left lower lobe atelectasis or infiltrate. This is similar to prior study. Electronically Signed   By: Rolm Baptise M.D.   On: 05/25/2017 09:13    Procedures Procedures (including critical care time)  CRITICAL CARE Performed by: Liesel Peckenpaugh   Total critical care time: 52 minutes  Critical care time was exclusive of  separately billable procedures and treating other patients.  Critical care was necessary to treat or prevent imminent or life-threatening deterioration.  Critical care was time spent personally by me on the following activities: development of treatment plan with patient and/or surrogate as well as nursing, discussions with consultants, evaluation of patient's response to treatment, examination of patient, obtaining history from patient or surrogate, ordering and performing treatments and interventions, ordering and review of laboratory studies, ordering and review of radiographic studies, pulse oximetry and re-evaluation of patient's  condition.   Medications Ordered in ED Medications  pantoprazole (PROTONIX) 80 mg in sodium chloride 0.9 % 250 mL (0.32 mg/mL) infusion (8 mg/hr Intravenous New Bag/Given 05/25/17 0929)  pantoprazole (PROTONIX) injection 40 mg (not administered)  diltiazem (CARDIZEM) 100 mg in dextrose 5 % 100 mL (1 mg/mL) infusion (5 mg/hr Intravenous Rate/Dose Change 05/25/17 1115)  lactated ringers infusion (not administered)  diltiazem (CARDIZEM CD) 24 hr capsule 180 mg (not administered)  metoprolol succinate (TOPROL-XL) 24 hr tablet 50 mg (not administered)  busPIRone (BUSPAR) tablet 5 mg (not administered)  predniSONE (DELTASONE) tablet 5 mg (not administered)   stroke: mapping our early stages of recovery book (not administered)  0.9 %  sodium chloride infusion (not administered)  ondansetron (ZOFRAN) tablet 4 mg (not administered)    Or  ondansetron (ZOFRAN) injection 4 mg (not administered)  acetaminophen (TYLENOL) tablet 1,000 mg (not administered)    Or  acetaminophen (TYLENOL) suppository 650 mg (not administered)  sucralfate (CARAFATE) 1 GM/10ML suspension 1 g (not administered)  0.9 %  sodium chloride infusion (10 mL/hr Intravenous New Bag/Given 05/25/17 0914)  pantoprazole (PROTONIX) 80 mg in sodium chloride 0.9 % 100 mL IVPB (0 mg Intravenous Stopped 05/25/17  1017)     Initial Impression / Assessment and Plan / ED Course  I have reviewed the triage vital signs and the nursing notes.  Pertinent labs & imaging results that were available during my care of the patient were reviewed by me and considered in my medical decision making (see chart for details).  Clinical Course as of May 25 1144  Mon May 25, 2017  1144 Results from the ER workup discussed with the patient face to face and all questions answered to the best of my ability.  Patient and family made aware that they will be seen by neurology for the aphasia, GI for suspected acute upper GI bleed.  Patient has been consented for blood transfusion for the acute anemia.  Patient has been started on diltiazem drip for the A. fib with RVR.  We suspect that the underlying etiology for the RVR is severe anemia and the possible stroke.  Goal heart rate is less than 110.  [AN]    Clinical Course User Index [AN] Varney Biles, MD    DDx includes: ICH / Stroke ACS Sepsis syndrome Infection - UTI/Pneumonia Encephalopathy  Electrolyte abnormality Medication side effects Metabolic disorders including thyroid disorders, adrenal insufficiency Cancer of unknown origin / paraneoplastic process  Patient comes in with chief complaint of what appears to be a aphasia.  Patient is noted to be AO x3 and appropriate with all of her responses in the ER.  Possible TIA versus small stroke.  Neurology consulted.  Given that there was no confusion and strict aphasia, neurology will come and assess the patient.  Patient is not a TPA candidate given significant improvement in her symptoms.  We will screen for UTI and ACS.  DDx includes: Esophagitis Mallory Weiss tear [place]  Variceal bleeding PUD/Gastritis/ulcers Diverticular bleed Colon cancer Rectal bleed Internal hemorrhoids  Patient is also noted to have hemoglobin of 6.8.  Patient has risk factors for upper GI bleed, complains of dark tarry  stools -and melena was confirmed by [place].  We will start patient on Protonix.  Spoke with Dr. Therisa Doyne, [facility], who recommends that we transfuse patient 2 units, start Protonix drip and she will see the patient.  Patient to be kept n.p.o..  Finally patient is in A. fib with RVR.  She  did not take her medications.  The underlying etiology for RVR is likely acute anemia, however infection and other stressors on the body like stroke or ACS are also possible.  We will start patient on a diltiazem drip, with a goal heart rate of less than 110.   Final Clinical Impressions(s) / ED Diagnoses   Final diagnoses:  Symptomatic anemia  TIA (transient ischemic attack)  Aphasia  Atrial fibrillation with RVR [facility])  Melena    ED Discharge Orders        Ordered    Amb referral to AFIB Clinic     05/25/17 [place], [place], MD 05/25/17 1145

## 2017-05-25 NOTE — ED Notes (Signed)
Clear liquid diet tray ordered 

## 2017-05-25 NOTE — ED Notes (Signed)
Requested hospital bed 

## 2017-05-25 NOTE — H&P (Signed)
History and Physical    Krystal Hensley PPJ:[id number] DOB: [birth year] DOA: 05/25/2017  PCP: Lajean Manes, MD Patient coming from: Home   Chief Complaint: difficulty speaking  HPI: Krystal Hensley is a 83 y.o. female with medical history significant of colon cancer status post partial colectomy, hypertension, PAF, pollen nausea rheumatic, ulnar hypertension, CVA. History provided in part by patient but also by patient's primary caretaker, her daughter. Patient reports approximately 2 day history of intermittent dark tarry stools area denies any crampy abdominal pain, bright red blood per rectum, hematochezia, hematemesis, abdominal pain, chest pain, shortness breath, palpitations, dysuria, frequency. Additionally on day of admission patient awoke at approximately 05:00 which was earlier than her normal area at this time patient was noted to have difficulty speaking both with and appropriate usage as well as restriction of her being unable to speak intended words. EMS was called and arrived approximately 20 minutes after initial symptoms began. At this time symptoms began to resolve spontaneously. Patient was brought to the emergency room with reports of being in A. fib with RVR per EMS. Last medications were taken the day prior to admission.     ED Course: placed on diltiazem drip. PRBCs ordered. Protonix drip started for GI bleed.  Review of Systems: As per HPI otherwise all other systems reviewed and are negative  Ambulatory Status: requires assistance w/ ADLs.   Past Medical History:  Diagnosis Date  . Arthritis   . Back pain   . Chronic diastolic CHF (congestive heart failure) ([place])   . Colon cancer ([place])   . HTN (hypertension) 08/08/2013  . Hypertension   . Hypoglycemia   . PAF (paroxysmal atrial fibrillation) ([place])   . Polymyalgia rheumatica ([place])   . Pulmonary hypertension ([place])    a. Echo 07/2014 - mildly increased PASP.  Marland Kitchen Stroke [facility])     Past Surgical History:    Procedure Laterality Date  . BILATERAL OOPHORECTOMY    . CESAREAN SECTION    . COLON SURGERY    . EYE SURGERY      Social History   Socioeconomic History  . Marital status: Widowed    Spouse name: Not on file  . Number of children: Not on file  . Years of education: Not on file  . Highest education level: Not on file  Social Needs  . Financial resource strain: Not on file  . Food insecurity - worry: Not on file  . Food insecurity - inability: Not on file  . Transportation needs - medical: Not on file  . Transportation needs - non-medical: Not on file  Occupational History  . Not on file  Tobacco Use  . Smoking status: Never Smoker  . Smokeless tobacco: Never Used  Substance and Sexual Activity  . Alcohol use: No  . Drug use: No  . Sexual activity: No  Other Topics Concern  . Not on file  Social History Narrative  . Not on file    Allergies  Allergen Reactions  . Chocolate Other (See Comments)    Unknown  . Statins Other (See Comments)    myalgia    Family History  Problem Relation Age of Onset  . Aneurysm Mother   . Heart attack Father       Prior to Admission medications   Medication Sig Start Date End Date Taking? Authorizing Provider  acetaminophen (TYLENOL) 325 MG tablet Take 325 mg every 6 (six) hours as needed by mouth for moderate pain.    Yes [provider]  aspirin 81 MG chewable tablet Chew 81 mg by mouth daily.   Yes [provider]  busPIRone (BUSPAR) 5 MG tablet Take 5 mg by mouth daily.  04/16/17  Yes [provider]  diltiazem (CARDIZEM CD) 180 MG 24 hr capsule Take 1 capsule (180 mg total) by mouth daily. 11/12/16  Yes Thurnell Lose, MD  furosemide (LASIX) 20 MG tablet Take 20 mg by mouth daily.   Yes [provider]  metoprolol succinate (TOPROL-XL) 50 MG 24 hr tablet take 1 tablet by mouth once daily with food or IMMEDIATELY FOLLOWING A MEAL 03/19/17  Yes Dorothy Spark, MD  Multiple  Vitamins-Minerals (VISION FORMULA/LUTEIN PO) Take 1 tablet by mouth 2 (two) times daily.    Yes [provider]  omeprazole (PRILOSEC) 20 MG capsule Take 20 mg by mouth daily.   Yes [provider]  polyethylene glycol (MIRALAX / GLYCOLAX) packet Take 17 g by mouth daily.   Yes [provider]  predniSONE (DELTASONE) 5 MG tablet Take 5 mg by mouth daily.  04/15/17  Yes [provider]  sennosides-docusate sodium (SENOKOT-S) 8.6-50 MG tablet Take 1 tablet by mouth daily as needed for constipation.    Yes [provider]  silver sulfADIAZINE (SILVADENE) 1 % cream Apply 1 application topically daily. Patient not taking: Reported on 05/25/2017 04/30/16   Trula Slade, DPM  traMADol (ULTRAM) 50 MG tablet Take 1 tablet (50 mg total) by mouth every 6 (six) hours as needed. Patient not taking: Reported on 05/25/2017 01/07/17   Fredia Sorrow, MD    Physical Exam: Vitals:   05/25/17 1017 05/25/17 1017 05/25/17 1029 05/25/17 1038  BP: (!) 129/91 (!) 92/54  (!) 92/59  Pulse: 65  (!) 107 (!) 120  Resp: (!) 22  (!) 24 (!) 26  SpO2: 98%  100% 96%  Weight:      Height:         General:  Elderly, fairly anxious, resting in bed. Eyes:  PERRL, EOMI, normal lids, iris ENT:  Dry mucous membranes, normal hearing. Neck:  no LAD, masses or thyromegaly Cardiovascular:  RRR, no m/r/g. No LE edema.  Respiratory:  CTA bilaterally, no w/r/r. Normal respiratory effort. Abdomen:  soft, ntnd, NABS Skin:  no rash or induration seen on limited exam Musculoskeletal: Poor muscle mass, No bony abnormalities. Psychiatric: Pleasant, repetitive questions. Follows commands. Neurologic:  CN 2-12 grossly intact, sensation intact, speech nml  Labs on Admission: I have personally reviewed following labs and imaging studies  CBC: Recent Labs  Lab 05/25/17 0720  WBC 12.5*  NEUTROABS 9.6*  HGB 6.8*  HCT 21.7*  MCV 103.8*  PLT 474   Basic Metabolic Panel: Recent  Labs  Lab 05/25/17 0720  NA 141  K 3.9  CL 111  CO2 21*  GLUCOSE 153*  BUN 33*  CREATININE 0.73  CALCIUM 8.1*  MG 2.3   GFR: Estimated Creatinine Clearance: 30.3 mL/min (by C-G formula based on SCr of 0.73 mg/dL). Liver Function Tests: Recent Labs  Lab 05/25/17 0720  AST 30  ALT 19  ALKPHOS 73  BILITOT 0.5  PROT 4.7*  ALBUMIN 2.5*   No results for input(s): LIPASE, AMYLASE in the last 168 hours. No results for input(s): AMMONIA in the last 168 hours. Coagulation Profile: Recent Labs  Lab 05/25/17 0720  INR 1.18   Cardiac Enzymes: No results for input(s): CKTOTAL, CKMB, CKMBINDEX, TROPONINI in the last 168 hours. BNP (last 3 results) Recent Labs  04/27/17 1347  PROBNP 3,438*   HbA1C: No results for input(s): HGBA1C in the last 72 hours. CBG: Recent Labs  Lab 05/25/17 0714  GLUCAP 150*   Lipid Profile: No results for input(s): CHOL, HDL, LDLCALC, TRIG, CHOLHDL, LDLDIRECT in the last 72 hours. Thyroid Function Tests: No results for input(s): TSH, T4TOTAL, FREET4, T3FREE, THYROIDAB in the last 72 hours. Anemia Panel: No results for input(s): VITAMINB12, FOLATE, FERRITIN, TIBC, IRON, RETICCTPCT in the last 72 hours. Urine analysis:    Component Value Date/Time   COLORURINE YELLOW 11/09/2016 1426   APPEARANCEUR CLEAR 11/09/2016 1426   LABSPEC 1.005 11/09/2016 1426   PHURINE 7.0 11/09/2016 1426   GLUCOSEU NEGATIVE 11/09/2016 1426   HGBUR NEGATIVE 11/09/2016 1426   BILIRUBINUR NEGATIVE 11/09/2016 1426   KETONESUR NEGATIVE 11/09/2016 1426   PROTEINUR NEGATIVE 11/09/2016 1426   UROBILINOGEN 0.2 12/07/2014 1325   NITRITE NEGATIVE 11/09/2016 1426   LEUKOCYTESUR NEGATIVE 11/09/2016 1426    Creatinine Clearance: Estimated Creatinine Clearance: 30.3 mL/min (by C-G formula based on SCr of 0.73 mg/dL).  Sepsis Labs: @LABRCNTIP (procalcitonin:4,lacticidven:4) )No results found for this or any previous visit (from the past 240 hour(s)).   Radiological  Exams on Admission: Ct Head Wo Contrast  Result Date: 05/25/2017 CLINICAL DATA:  Altered mental status. EXAM: CT HEAD WITHOUT CONTRAST TECHNIQUE: Contiguous axial images were obtained from the base of the skull through the vertex without intravenous contrast. COMPARISON:  CT scan of December 07, 2014. FINDINGS: Brain: Mild diffuse cortical atrophy is noted. Mild chronic ischemic white matter disease is noted. No mass effect or midline shift is noted. Ventricular size is within normal limits. There is no evidence of mass lesion, hemorrhage or acute infarction. Vascular: No hyperdense vessel or unexpected calcification. Skull: Normal. Negative for fracture or focal lesion. Sinuses/Orbits: Mild sphenoid sinusitis is noted. Other: None. IMPRESSION: Mild diffuse cortical atrophy. Mild chronic ischemic white matter disease. No acute intracranial abnormality seen. Electronically Signed   By: Marijo Conception, M.D.   On: 05/25/2017 08:51   Dg Chest Port 1 View  Result Date: 05/25/2017 CLINICAL DATA:  Atrial fibrillation EXAM: PORTABLE CHEST 1 VIEW COMPARISON:  11/10/2016 FINDINGS: Cardiomegaly with vascular congestion. Left lower lobe atelectasis or infiltrate. Right lung is clear. No acute bony abnormality. Advanced degenerative changes in the shoulders. IMPRESSION: Cardiomegaly with vascular congestion. Left lower lobe atelectasis or infiltrate. This is similar to prior study. Electronically Signed   By: Rolm Baptise M.D.   On: 05/25/2017 09:13    EKG: pending  Assessment/Plan Active Problems:   PMR (polymyalgia rheumatica) (HCC)   Hx of PAF- CHADS VASC=7   Atrial fibrillation with RVR (HCC)   Chronic diastolic CHF (congestive heart failure) (HCC)   TIA (transient ischemic attack)   History of CVA (cerebrovascular accident)   Symptomatic anemia   Upper GI bleed    Expressive aphasia: Likely secondary to hypoperfusion of the patient's brain secondary to profound anemia, and hypotension. Spontaneous  resolve after approximately 20-30 minutes. Neurology consult by EDP. - Follow-up neurology recommendations - Neuro checks - Echo, MRI/MRA, Carotid dopplers per nuero - PT/OT - Initiate statin but will hold ASA due to GI bleed  Symptomatic anemia: Hemoglobin 6.8. Baseline 14. Reports of melanotic stool in the setting of chronic ASA and prednisone he use concerning for upper GI bleed. Started on comments in ED. - Repeat CBC - 2 units PRBC - Posttransfusion CBC - Treatment of GI bleed as below  Afib: in RVR at time of admission. Likely secondary  to underlying Afib w/ profound anemia. Dilt drip w/ little ability to capture rate in the ED secondary to dehydration, anemia.  - PRBC as above - 1L bolus - Continue dilt drip - Resume home dilt, metop.   Gi bleed: Likely upper. [facility] evaluating. On chronic ASA and prednisone.  - Protonix - suspect upper endo on 11/20 when pt is stable - Carafate on 05/25/17 only - Pt can eat now and then NPO after midnight.  - Further mgt per GI  Chronic diastolic congestive heart failure: Last echo from 2016 showing EF of 77% and mild diastolic dysfunction. No evidence of acute fluid overload. Recently underwent more aggressive diuresis regimen with subsequent tapering of dose. Patient's weight is currently down approximately 20 pounds.  - I/O, daily weights - Hold lasix at time of admission  PMR: on prednisone 5mg  chronically - continue prednisone.   Anxiety: - continue Buspar    DVT prophylaxis: scd  Code Status: DNR  Family Communication: daugthere  Disposition Plan: pending improvement in HR, EGD by GI, and full Neuro workup/eval  Consults called: Neuro, GI  Admission status: inpt    Waldemar Dickens MD [facility]  If 7PM-7AM, please contact night-coverage [email or web address] Password [facility]  05/25/2017, 11:02 AM     Critical Care Time This patient is critically ill requiring frequent monitoring and support due to their  life/organ threatening condition. This care involves a high complexity of medical decision making. Greater than 70 minutes spent in direct patient care and coordination.

## 2017-05-25 NOTE — ED Notes (Signed)
Consult MD in room at this time.

## 2017-05-25 NOTE — ED Notes (Signed)
Pt placed in yellow high fall risk socks.

## 2017-05-25 NOTE — Consult Note (Signed)
Subjective:   HPI  The patient is a 83 year old female who was brought to the emergency room today because it was noted this morning that she was having difficulty expressing herself. According to her daughter thinks that she was saying were not making sense. As a little time went on however things started to improve.  We were asked to see her because of anemia noted on her blood work here in the emergency room and it was mentioned that yesterday her caretaker noted a dark colored stool. The patient denies history of peptic ulcer disease. She denies vomiting or hematemesis. She denies heartburn. She denies abdominal pain. She takes a baby aspirin.  She does have a history of colon cancer when she was in her 77s.  Review of Systems No chest pain or shortness of breath  Past Medical History:  Diagnosis Date  . Arthritis   . Back pain   . Chronic diastolic CHF (congestive heart failure) ([place])   . Colon cancer ([place])   . HTN (hypertension) 08/08/2013  . Hypertension   . Hypoglycemia   . PAF (paroxysmal atrial fibrillation) ([place])   . Polymyalgia rheumatica ([place])   . Pulmonary hypertension ([place])    a. Echo 07/2014 - mildly increased PASP.  Marland Kitchen Stroke [facility])    Past Surgical History:  Procedure Laterality Date  . BILATERAL OOPHORECTOMY    . CESAREAN SECTION    . COLON SURGERY    . EYE SURGERY     Social History   Socioeconomic History  . Marital status: Widowed    Spouse name: Not on file  . Number of children: Not on file  . Years of education: Not on file  . Highest education level: Not on file  Social Needs  . Financial resource strain: Not on file  . Food insecurity - worry: Not on file  . Food insecurity - inability: Not on file  . Transportation needs - medical: Not on file  . Transportation needs - non-medical: Not on file  Occupational History  . Not on file  Tobacco Use  . Smoking status: Never Smoker  . Smokeless tobacco: Never Used  Substance and Sexual Activity  .  Alcohol use: No  . Drug use: No  . Sexual activity: No  Other Topics Concern  . Not on file  Social History Narrative  . Not on file   family history includes Aneurysm in her mother; Heart attack in her father.  Current Facility-Administered Medications:  .  diltiazem (CARDIZEM) 100 mg in dextrose 5 % 100 mL (1 mg/mL) infusion, 5-15 mg/hr, Intravenous, Continuous, Nanavati, Ankit, MD, Last Rate: 10 mL/hr at 05/25/17 1037, 10 mg/hr at 05/25/17 1037 .  lactated ringers infusion, , Intravenous, Continuous, Nanavati, Ankit, MD, Last Rate: 999 mL/hr at 05/25/17 1041 .  pantoprazole (PROTONIX) 80 mg in sodium chloride 0.9 % 250 mL (0.32 mg/mL) infusion, 8 mg/hr, Intravenous, Continuous, Nanavati, Ankit, MD, Last Rate: 25 mL/hr at 05/25/17 0929, 8 mg/hr at 05/25/17 0929 .  [START ON 05/28/2017] pantoprazole (PROTONIX) injection 40 mg, 40 mg, Intravenous, Q12H, Nanavati, Ankit, MD  Current Outpatient Medications:  .  acetaminophen (TYLENOL) 325 MG tablet, Take 325 mg every 6 (six) hours as needed by mouth for moderate pain. , Disp: , Rfl:  .  aspirin 81 MG chewable tablet, Chew 81 mg by mouth daily., Disp: , Rfl:  .  busPIRone (BUSPAR) 5 MG tablet, Take 5 mg by mouth daily. , Disp: , Rfl: 1 .  diltiazem (CARDIZEM CD)  180 MG 24 hr capsule, Take 1 capsule (180 mg total) by mouth daily., Disp: 30 capsule, Rfl: 0 .  furosemide (LASIX) 20 MG tablet, Take 20 mg by mouth daily., Disp: , Rfl:  .  metoprolol succinate (TOPROL-XL) 50 MG 24 hr tablet, take 1 tablet by mouth once daily with food or IMMEDIATELY FOLLOWING A MEAL, Disp: 90 tablet, Rfl: 1 .  Multiple Vitamins-Minerals (VISION FORMULA/LUTEIN PO), Take 1 tablet by mouth 2 (two) times daily. , Disp: , Rfl:  .  omeprazole (PRILOSEC) 20 MG capsule, Take 20 mg by mouth daily., Disp: , Rfl:  .  polyethylene glycol (MIRALAX / GLYCOLAX) packet, Take 17 g by mouth daily., Disp: , Rfl:  .  predniSONE (DELTASONE) 5 MG tablet, Take 5 mg by mouth daily. , Disp:  , Rfl: 0 .  sennosides-docusate sodium (SENOKOT-S) 8.6-50 MG tablet, Take 1 tablet by mouth daily as needed for constipation. , Disp: , Rfl:  .  silver sulfADIAZINE (SILVADENE) 1 % cream, Apply 1 application topically daily. (Patient not taking: Reported on 05/25/2017), Disp: 50 g, Rfl: 0 .  traMADol (ULTRAM) 50 MG tablet, Take 1 tablet (50 mg total) by mouth every 6 (six) hours as needed. (Patient not taking: Reported on 05/25/2017), Disp: 15 tablet, Rfl: 0 Allergies  Allergen Reactions  . Chocolate Other (See Comments)    Unknown  . Statins Other (See Comments)    myalgia     Objective:     BP (!) 92/59   Pulse (!) 120   Resp (!) 26   Ht 5\' 1"  (1.549 m)   Wt 49.9 kg (110 lb)   SpO2 96%   BMI 20.78 kg/m   No distress  Seems alert and understands our conversations  Heart irregular  Lungs clear  Abdomen: Bowel sounds present, soft, nontender  Laboratory No components found for: D1    Assessment:     Anemia with a hemoglobin noted to be 6.8 and history of a dark colored stool yesterday.  Multiple other medical problems as mentioned above      Plan:     I had a long talk with her and her daughter. One option was to treat her medically with PPI therapy and blood transfusion, and the other option was in addition to this do an EGD. After carefully considering the options with potential risks we have decided to treat her and proceed with EGD tomorrow provided she is stable. I do not see a reason for an emergent EGD today. I think she can have some clear liquids. Lab Results  Component Value Date   HGB 6.8 (LL) 05/25/2017   HGB 13.9 11/09/2016   HGB 12.0 12/02/2015   HCT 21.7 (L) 05/25/2017   HCT 43.2 11/09/2016   HCT 36.4 12/02/2015   ALKPHOS 73 05/25/2017   ALKPHOS 107 12/07/2014   ALKPHOS 113 07/24/2014   AST 30 05/25/2017   AST 31 12/07/2014   AST 112 (H) 07/24/2014   ALT 19 05/25/2017   ALT 17 12/07/2014   ALT 89 (H) 07/24/2014

## 2017-05-25 NOTE — ED Notes (Signed)
Spoke to provider, who did confirm that they wanted labs prior to third unit tranfusion.  He placed order, RN drew blood and sent to lab.  Called floor and asked them to come get pt.

## 2017-05-25 NOTE — ED Notes (Signed)
Hospitalist in room at this time.  

## 2017-05-25 NOTE — ED Notes (Signed)
X-Ray in room.

## 2017-05-25 NOTE — ED Notes (Signed)
Neuro MD in with pt at this time.

## 2017-05-25 NOTE — H&P (View-Only) (Signed)
Subjective:   HPI  The patient is a 83 year old female who was brought to the emergency room today because it was noted this morning that she was having difficulty expressing herself. According to her daughter thinks that she was saying were not making sense. As a little time went on however things started to improve.  We were asked to see her because of anemia noted on her blood work here in the emergency room and it was mentioned that yesterday her caretaker noted a dark colored stool. The patient denies history of peptic ulcer disease. She denies vomiting or hematemesis. She denies heartburn. She denies abdominal pain. She takes a baby aspirin.  She does have a history of colon cancer when she was in her 38s.  Review of Systems No chest pain or shortness of breath  Past Medical History:  Diagnosis Date  . Arthritis   . Back pain   . Chronic diastolic CHF (congestive heart failure) ([place])   . Colon cancer ([place])   . HTN (hypertension) 08/08/2013  . Hypertension   . Hypoglycemia   . PAF (paroxysmal atrial fibrillation) ([place])   . Polymyalgia rheumatica ([place])   . Pulmonary hypertension ([place])    a. Echo 07/2014 - mildly increased PASP.  Marland Kitchen Stroke [facility])    Past Surgical History:  Procedure Laterality Date  . BILATERAL OOPHORECTOMY    . CESAREAN SECTION    . COLON SURGERY    . EYE SURGERY     Social History   Socioeconomic History  . Marital status: Widowed    Spouse name: Not on file  . Number of children: Not on file  . Years of education: Not on file  . Highest education level: Not on file  Social Needs  . Financial resource strain: Not on file  . Food insecurity - worry: Not on file  . Food insecurity - inability: Not on file  . Transportation needs - medical: Not on file  . Transportation needs - non-medical: Not on file  Occupational History  . Not on file  Tobacco Use  . Smoking status: Never Smoker  . Smokeless tobacco: Never Used  Substance and Sexual Activity  .  Alcohol use: No  . Drug use: No  . Sexual activity: No  Other Topics Concern  . Not on file  Social History Narrative  . Not on file   family history includes Aneurysm in her mother; Heart attack in her father.  Current Facility-Administered Medications:  .  diltiazem (CARDIZEM) 100 mg in dextrose 5 % 100 mL (1 mg/mL) infusion, 5-15 mg/hr, Intravenous, Continuous, Nanavati, Ankit, MD, Last Rate: 10 mL/hr at 05/25/17 1037, 10 mg/hr at 05/25/17 1037 .  lactated ringers infusion, , Intravenous, Continuous, Nanavati, Ankit, MD, Last Rate: 999 mL/hr at 05/25/17 1041 .  pantoprazole (PROTONIX) 80 mg in sodium chloride 0.9 % 250 mL (0.32 mg/mL) infusion, 8 mg/hr, Intravenous, Continuous, Nanavati, Ankit, MD, Last Rate: 25 mL/hr at 05/25/17 0929, 8 mg/hr at 05/25/17 0929 .  [START ON 05/28/2017] pantoprazole (PROTONIX) injection 40 mg, 40 mg, Intravenous, Q12H, Nanavati, Ankit, MD  Current Outpatient Medications:  .  acetaminophen (TYLENOL) 325 MG tablet, Take 325 mg every 6 (six) hours as needed by mouth for moderate pain. , Disp: , Rfl:  .  aspirin 81 MG chewable tablet, Chew 81 mg by mouth daily., Disp: , Rfl:  .  busPIRone (BUSPAR) 5 MG tablet, Take 5 mg by mouth daily. , Disp: , Rfl: 1 .  diltiazem (CARDIZEM CD)  180 MG 24 hr capsule, Take 1 capsule (180 mg total) by mouth daily., Disp: 30 capsule, Rfl: 0 .  furosemide (LASIX) 20 MG tablet, Take 20 mg by mouth daily., Disp: , Rfl:  .  metoprolol succinate (TOPROL-XL) 50 MG 24 hr tablet, take 1 tablet by mouth once daily with food or IMMEDIATELY FOLLOWING A MEAL, Disp: 90 tablet, Rfl: 1 .  Multiple Vitamins-Minerals (VISION FORMULA/LUTEIN PO), Take 1 tablet by mouth 2 (two) times daily. , Disp: , Rfl:  .  omeprazole (PRILOSEC) 20 MG capsule, Take 20 mg by mouth daily., Disp: , Rfl:  .  polyethylene glycol (MIRALAX / GLYCOLAX) packet, Take 17 g by mouth daily., Disp: , Rfl:  .  predniSONE (DELTASONE) 5 MG tablet, Take 5 mg by mouth daily. , Disp:  , Rfl: 0 .  sennosides-docusate sodium (SENOKOT-S) 8.6-50 MG tablet, Take 1 tablet by mouth daily as needed for constipation. , Disp: , Rfl:  .  silver sulfADIAZINE (SILVADENE) 1 % cream, Apply 1 application topically daily. (Patient not taking: Reported on 05/25/2017), Disp: 50 g, Rfl: 0 .  traMADol (ULTRAM) 50 MG tablet, Take 1 tablet (50 mg total) by mouth every 6 (six) hours as needed. (Patient not taking: Reported on 05/25/2017), Disp: 15 tablet, Rfl: 0 Allergies  Allergen Reactions  . Chocolate Other (See Comments)    Unknown  . Statins Other (See Comments)    myalgia     Objective:     BP (!) 92/59   Pulse (!) 120   Resp (!) 26   Ht 5\' 1"  (1.549 m)   Wt 49.9 kg (110 lb)   SpO2 96%   BMI 20.78 kg/m   No distress  Seems alert and understands our conversations  Heart irregular  Lungs clear  Abdomen: Bowel sounds present, soft, nontender  Laboratory No components found for: D1    Assessment:     Anemia with a hemoglobin noted to be 6.8 and history of a dark colored stool yesterday.  Multiple other medical problems as mentioned above      Plan:     I had a long talk with her and her daughter. One option was to treat her medically with PPI therapy and blood transfusion, and the other option was in addition to this do an EGD. After carefully considering the options with potential risks we have decided to treat her and proceed with EGD tomorrow provided she is stable. I do not see a reason for an emergent EGD today. I think she can have some clear liquids. Lab Results  Component Value Date   HGB 6.8 (LL) 05/25/2017   HGB 13.9 11/09/2016   HGB 12.0 12/02/2015   HCT 21.7 (L) 05/25/2017   HCT 43.2 11/09/2016   HCT 36.4 12/02/2015   ALKPHOS 73 05/25/2017   ALKPHOS 107 12/07/2014   ALKPHOS 113 07/24/2014   AST 30 05/25/2017   AST 31 12/07/2014   AST 112 (H) 07/24/2014   ALT 19 05/25/2017   ALT 17 12/07/2014   ALT 89 (H) 07/24/2014

## 2017-05-25 NOTE — ED Notes (Signed)
[name] called and pt gave permission to speak to her.  Up dated her to her status.

## 2017-05-25 NOTE — ED Notes (Signed)
Notified MD per protocol. Per MD continue Cardizem at 5

## 2017-05-25 NOTE — ED Notes (Signed)
Pastor in room with patient.

## 2017-05-26 ENCOUNTER — Inpatient Hospital Stay (HOSPITAL_COMMUNITY): Payer: Medicare Other | Admitting: Anesthesiology

## 2017-05-26 ENCOUNTER — Encounter (HOSPITAL_COMMUNITY)
Admission: EM | Disposition: A | Discharge status: Skilled Nursing Facility | Payer: Self-pay | Source: Home / Self Care | Attending: Internal Medicine

## 2017-05-26 ENCOUNTER — Inpatient Hospital Stay (HOSPITAL_COMMUNITY): Payer: Medicare Other

## 2017-05-26 ENCOUNTER — Encounter (HOSPITAL_COMMUNITY): Payer: Self-pay | Admitting: *Deleted

## 2017-05-26 DIAGNOSIS — G459 Transient cerebral ischemic attack, unspecified: Secondary | ICD-10-CM

## 2017-05-26 DIAGNOSIS — D649 Anemia, unspecified: Secondary | ICD-10-CM

## 2017-05-26 DIAGNOSIS — R4701 Aphasia: Secondary | ICD-10-CM

## 2017-05-26 DIAGNOSIS — M353 Polymyalgia rheumatica: Secondary | ICD-10-CM

## 2017-05-26 HISTORY — PX: ESOPHAGOGASTRODUODENOSCOPY (EGD) WITH PROPOFOL: SHX5813

## 2017-05-26 LAB — LIPID PANEL
Cholesterol: 116 mg/dL (ref 0–200)
HDL: 27 mg/dL — ABNORMAL LOW (ref >40)
LDL CALC: 57 mg/dL (ref 0–99)
TRIGLYCERIDES: 160 mg/dL — AB (ref <150)
Total CHOL/HDL Ratio: 4.3 RATIO
VLDL: 32 mg/dL (ref 0–40)

## 2017-05-26 LAB — BASIC METABOLIC PANEL
Anion gap: 8 (ref 5–15)
BUN: 27 mg/dL — ABNORMAL HIGH (ref 6–20)
CO2: 20 mmol/L — AB (ref 22–32)
CREATININE: 0.61 mg/dL (ref 0.44–1.00)
Calcium: 7.9 mg/dL — ABNORMAL LOW (ref 8.9–10.3)
Chloride: 111 mmol/L (ref 101–111)
GFR calc non Af Amer: >60 mL/min (ref >60)
Glucose, Bld: 101 mg/dL — ABNORMAL HIGH (ref 65–99)
POTASSIUM: 3.9 mmol/L (ref 3.5–5.1)
SODIUM: 139 mmol/L (ref 135–145)

## 2017-05-26 LAB — CBC
HEMATOCRIT: 27.9 % — AB (ref 36.0–46.0)
HEMOGLOBIN: 8.9 g/dL — AB (ref 12.0–15.0)
MCH: 30.9 pg (ref 26.0–34.0)
MCHC: 31.9 g/dL (ref 30.0–36.0)
MCV: 96.9 fL (ref 78.0–100.0)
Platelets: 182 10*3/uL (ref 150–400)
RBC: 2.88 MIL/uL — AB (ref 3.87–5.11)
RDW: 17.6 % — ABNORMAL HIGH (ref 11.5–15.5)
WBC: 11.9 10*3/uL — ABNORMAL HIGH (ref 4.0–10.5)

## 2017-05-26 LAB — HEMOGLOBIN A1C
HEMOGLOBIN A1C: 5.4 % (ref 4.8–5.6)
Mean Plasma Glucose: 108.28 mg/dL

## 2017-05-26 SURGERY — ESOPHAGOGASTRODUODENOSCOPY (EGD) WITH PROPOFOL
Anesthesia: Monitor Anesthesia Care

## 2017-05-26 MED ORDER — SENNOSIDES-DOCUSATE SODIUM 8.6-50 MG PO TABS: 1.0000 | ORAL_TABLET | Freq: Every day | ORAL | Status: DC | PRN

## 2017-05-26 MED ORDER — ONDANSETRON HCL 4 MG/2ML IJ SOLN
INTRAMUSCULAR | Status: DC | PRN
  Administered 2017-05-26: 4 mg via INTRAVENOUS

## 2017-05-26 MED ORDER — PANTOPRAZOLE SODIUM 40 MG PO TBEC: 80.0000 mg | DELAYED_RELEASE_TABLET | Freq: Every day | ORAL | Status: DC

## 2017-05-26 MED ORDER — ACETAMINOPHEN 325 MG PO TABS: 325.0000 mg | ORAL_TABLET | Freq: Four times a day (QID) | ORAL | Status: DC | PRN

## 2017-05-26 MED ORDER — SODIUM CHLORIDE 0.9 % IJ SOLN
INTRAMUSCULAR | Status: DC | PRN
  Administered 2017-05-26: 1 mL
  Administered 2017-05-26: 3 mL

## 2017-05-26 MED ORDER — VISION FORMULA/LUTEIN PO TABS: 1.0000 | ORAL_TABLET | Freq: Two times a day (BID) | ORAL | Status: DC

## 2017-05-26 MED ORDER — PROPOFOL 10 MG/ML IV BOLUS
INTRAVENOUS | Status: DC | PRN
  Administered 2017-05-26 (×2): 20 mg via INTRAVENOUS
  Administered 2017-05-26: 10 mg via INTRAVENOUS
  Administered 2017-05-26 (×4): 20 mg via INTRAVENOUS

## 2017-05-26 MED ORDER — POLYETHYLENE GLYCOL 3350 17 G PO PACK
17.0000 g | PACK | Freq: Every day | ORAL | Status: DC
  Administered 2017-05-26 – 2017-05-27 (×2): 17 g via ORAL
  Filled 2017-05-26 (×3): qty 1

## 2017-05-26 MED ORDER — ASPIRIN 81 MG PO CHEW: 81.0000 mg | CHEWABLE_TABLET | Freq: Every day | ORAL | Status: DC

## 2017-05-26 MED ORDER — FUROSEMIDE 20 MG PO TABS
20.0000 mg | ORAL_TABLET | Freq: Every day | ORAL | Status: DC
  Administered 2017-05-26 – 2017-05-27 (×2): 20 mg via ORAL
  Filled 2017-05-26 (×2): qty 1

## 2017-05-26 MED ORDER — PHENYLEPHRINE HCL 10 MG/ML IJ SOLN
INTRAMUSCULAR | Status: DC | PRN
  Administered 2017-05-26 (×4): 80 ug via INTRAVENOUS

## 2017-05-26 MED ORDER — EPINEPHRINE PF 1 MG/10ML IJ SOSY
PREFILLED_SYRINGE | INTRAMUSCULAR | Status: AC
  Filled 2017-05-26: qty 10

## 2017-05-26 NOTE — Transfer of Care (Signed)
Immediate Anesthesia Transfer of Care Note  Patient: Krystal Hensley  Procedure(s) Performed: ESOPHAGOGASTRODUODENOSCOPY (EGD) WITH PROPOFOL (N/A )  Patient Location: Endoscopy Unit  Anesthesia Type:MAC  Level of Consciousness: drowsy  Airway & Oxygen Therapy: Patient Spontanous Breathing and Patient connected to nasal cannula oxygen  Post-op Assessment: Report given to RN and Post -op Vital signs reviewed and stable  Post vital signs: Reviewed and stable  Last Vitals:  Vitals:   05/26/17 1157 05/26/17 1335  BP: 125/67 (!) 95/38  Pulse: 90   Resp: 18 18  Temp:  36.5 C  SpO2: 97% 94%    Last Pain:  Vitals:   05/26/17 1335  TempSrc: Oral  PainSc:          Complications: No apparent anesthesia complications

## 2017-05-26 NOTE — Brief Op Note (Signed)
05/25/2017 - 05/26/2017  1:38 PM  PATIENT:  Krystal Hensley  83 y.o. female  PRE-OPERATIVE DIAGNOSIS:  anemia, melena  POST-OPERATIVE DIAGNOSIS:  hiatal hernia, barretts esophagus, esophagitis, 2 duodenal bulb ulcers: 4cc epi injected, 3 clips placed  PROCEDURE:  Procedure(s): ESOPHAGOGASTRODUODENOSCOPY (EGD) WITH PROPOFOL (N/A)  SURGEON:  Surgeon(s) and Role:    [name], MD - Primary  PHYSICIAN ASSISTANT:   ASSISTANTS: Vista Lawman, RN, Alan Mulder, Tech   ANESTHESIA:   MAC  KWI:[id number]  BLOOD ADMINISTERED:none  DRAINS: none   LOCAL MEDICATIONS USED:  NONE  SPECIMEN:  No Specimen  DISPOSITION OF SPECIMEN:  N/A  COUNTS:  YES  TOURNIQUET:  * No tourniquets in log *  DICTATION: .Dragon Dictation  PLAN OF CARE: Admit to inpatient   PATIENT DISPOSITION:  PACU - hemodynamically stable.   Delay start of Pharmacological VTE agent (>24hrs) due to surgical blood loss or risk of bleeding: yes

## 2017-05-26 NOTE — Care Management Note (Signed)
Case Management Note  Patient Details  Name: Krystal Hensley MRN: [id number] Date of Birth: 03/08/1918  Subjective/Objective:    Pt admitted with anemia and CVA. She is from home with her daughter.                 Action/Plan: PT recommending [facility] services. Awaiting OT eval. CM following for d/c needs, physician orders.   Expected Discharge Date:                  Expected Discharge Plan:  [place]  In-House Referral:     Discharge planning Services     Post Acute Care Choice:    Choice offered to:     DME Arranged:    DME Agency:     HH Arranged:    [place] Agency:     Status of Service:  In process, will continue to follow  If discussed at Long Length of Stay Meetings, dates discussed:    Additional Comments:  Pollie Friar, RN 05/26/2017, 10:32 AM

## 2017-05-26 NOTE — Progress Notes (Addendum)
PROGRESS NOTE  Krystal Hensley YQM:[id number] DOB: 12-09-1917 DOA: 05/25/2017 PCP: Lajean Manes, MD  HPI/Recap of past 24 hours Krystal Hensley is a 83 yo CF with PMH significant for colon cancer status post partial colectomy, hypertension, PAF, pollen nausea rheumatic, ulnar hypertension, CVA who presented to the ED 05/26/19 from home with complaints of difficulty speaking. Upon presentation patient was severely anemic with positive FOBT. Also in A-ib rvr. MRI brain 05/26/17 revealed: 1. Patchy small volume acute ischemic nonhemorrhagic left cerebellar Infarcts. 2. Remote lacunar infarcts involving the left thalamus and bilateral cerebellar hemispheres. 3. Age-related atrophy with advanced chronic microvascular ischemic disease.  Neurology stroke team followed up and recommended the patient following up with them as outpatient in 6 weeks. Will possibly start eliquis then as secondary prevention for recurrent CVA. Will hold off anticoagulation for now due to suspected GI bleed complicated by severe anemia requiring blood transfusion  Patient was seen and examined at her bedside. She is alert but confused and very hard of hearing. Unable to obtain a ROS due to confusion.    Assessment/Plan: Active Problems:   PMR (polymyalgia rheumatica) (HCC)   Hx of PAF- CHADS VASC=7   Atrial fibrillation with RVR (HCC)   Chronic diastolic CHF (congestive heart failure) (HCC)   TIA (transient ischemic attack)   History of CVA (cerebrovascular accident)   Symptomatic anemia   Upper GI bleed  Acute ischemic non hemorrhagic left cerebellar infarct -In the setting of remote lacunar infarcts left thalamus and b/l cerebellar hemispoheres -a-fib and not anticoagulated -currently with acute GI bleed -Hold anticoagulation until GI bleed has resolved -Neurology following. Will continue in the outpatient setting. -asa, statin, BP control -pt has allergy to statin  Expressive aphasia, resolved -management  as stated above  A-fib with RVR with hx of paroxysmal a-fib -rate control -hold off anticoagulation due to acute GI bleed complicated by severe anemia requiring blood transfusion -CHADS2 score 5- high -follow up with stroke team in 6 weeks. Possibly will start anticoagulation at that time once GI bleed has resolved -monitor in telemetry  Symptomatic severe anemia 2/2 to duodenal ulcers -hg 6.8 with baseline 14 -3U PRBCs ordered for transfusion -FOBT + -EGD revealed: 2 cratered ulcers noted at duodenal sweep, largest 8 mm in size. Barrett's esophagus 2cm, distal esophagitis, 4cm hiatal hernia. -Biopsies not taken due to recent ASA administration -CBC am  Chronic diastolic congestive heart failure - Last echo from 2016 showing EF of 52% and mild diastolic dysfunction. No evidence of acute fluid overload.  - I/O, daily weights - Held lasix at time of admission due to acute GI bleed to avoid hypotension  PMR - prednisone 5mg  chronically - continue prednisone.   Anxiety - continue Buspar   Altered mental status 2/2 to delirium vs others - reorient as needed - reassess periodically - fall precaution  Code Status: DNR  Family Communication:   Disposition Plan: Will stay another midnight for close monitoring and to continue current management.   Consultants:  Post stroke team  GI  Procedures:  Planned EGD today  Antimicrobials:  None  DVT prophylaxis:  SCDs.Not on chemical DVT PPX due to acute GI bleed in the setting of severe anemia requiring blood transfusion.    Objective: Vitals:   05/25/17 2045 05/25/17 2100 05/25/17 2300 05/26/17 0652  BP: 119/64 (!) 120/93 (!) 109/59 (P) 109/63  Pulse: 83 89 81 (P) 96  Resp: (!) 30 (!) 26 (!) 24 (P) 20  Temp:   98.3 F (  36.8 C) (P) 98.9 F (37.2 C)  TempSrc:   Axillary (P) Oral  SpO2: 96% 98% 97% (P) 93%  Weight:   49.8 kg (109 lb 12.6 oz)   Height:   5\' 1"  (1.549 m)     Intake/Output Summary (Last 24 hours)  at 05/26/2017 0849 Last data filed at 05/26/2017 0346 Gross per 24 hour  Intake 1849.91 ml  Output -  Net 1849.91 ml   Filed Weights   05/25/17 0934 05/25/17 2300  Weight: 49.9 kg (110 lb) 49.8 kg (109 lb 12.6 oz)    Exam:   General:  83 yo CF emaciated, laying in bed in NAD  Cardiovascular: IRRR with no rubs or gallops   Respiratory: CTA with no wheezes or rales  Abdomen: Soft, tender at epigastric and mid lower quadrant  Musculoskeletal: Moves all limbs. Bruising noted on LE bilaterally  Skin: Echimosis noted in LE anteriorly and bilaterally  Psychiatry: Unable to assess due to confusion   Data Reviewed: CBC: Recent Labs  Lab 05/25/17 0720 05/25/17 2220 05/26/17 0442  WBC 11.5*  12.5* 12.4* 11.9*  NEUTROABS 9.4*  9.6*  --   --   HGB 6.6*  6.8* 9.2* 8.9*  HCT 20.8*  21.7* 27.2* 27.9*  MCV 104.0*  103.8* 100.4* 96.9  PLT 201  216 179 858   Basic Metabolic Panel: Recent Labs  Lab 05/25/17 0720 05/25/17 2220 05/26/17 0442  NA 141 138 139  K 3.9 4.3 3.9  CL 111 111 111  CO2 21* 21* 20*  GLUCOSE 153* 120* 101*  BUN 33* 29* 27*  CREATININE 0.73 0.64 0.61  CALCIUM 8.1* 7.9* 7.9*  MG 2.3  --   --   PHOS 3.0  --   --    GFR: Estimated Creatinine Clearance: 30.3 mL/min (by C-G formula based on SCr of 0.61 mg/dL). Liver Function Tests: Recent Labs  Lab 05/25/17 0720 05/25/17 2220  AST 30 31  ALT 19 19  ALKPHOS 73 70  BILITOT 0.5 3.0*  PROT 4.7* 4.6*  ALBUMIN 2.5* 2.5*   No results for input(s): LIPASE, AMYLASE in the last 168 hours. No results for input(s): AMMONIA in the last 168 hours. Coagulation Profile: Recent Labs  Lab 05/25/17 0720  INR 1.18   Cardiac Enzymes: No results for input(s): CKTOTAL, CKMB, CKMBINDEX, TROPONINI in the last 168 hours. BNP (last 3 results) Recent Labs    04/27/17 1347  PROBNP 3,438*   HbA1C: Recent Labs    05/26/17 0442  HGBA1C 5.4   CBG: Recent Labs  Lab 05/25/17 0714  GLUCAP 150*    Lipid Profile: Recent Labs    05/26/17 0442  CHOL 116  HDL 27*  LDLCALC 57  TRIG 160*  CHOLHDL 4.3   Thyroid Function Tests: No results for input(s): TSH, T4TOTAL, FREET4, T3FREE, THYROIDAB in the last 72 hours. Anemia Panel: No results for input(s): VITAMINB12, FOLATE, FERRITIN, TIBC, IRON, RETICCTPCT in the last 72 hours. Urine analysis:    Component Value Date/Time   COLORURINE YELLOW 05/25/2017 0738   APPEARANCEUR CLEAR 05/25/2017 0738   LABSPEC 1.021 05/25/2017 0738   PHURINE 5.0 05/25/2017 0738   GLUCOSEU NEGATIVE 05/25/2017 0738   HGBUR NEGATIVE 05/25/2017 0738   BILIRUBINUR NEGATIVE 05/25/2017 0738   KETONESUR NEGATIVE 05/25/2017 0738   PROTEINUR NEGATIVE 05/25/2017 0738   UROBILINOGEN 0.2 12/07/2014 1325   NITRITE NEGATIVE 05/25/2017 0738   LEUKOCYTESUR NEGATIVE 05/25/2017 0738   Sepsis Labs: @LABRCNTIP (procalcitonin:4,lacticidven:4)  )No results found for this or any previous  visit (from the past 240 hour(s)).    Studies: Mr Brain 58 Contrast  Result Date: 05/26/2017 CLINICAL DATA:  Initial evaluation for acute speech difficulty. EXAM: MRI HEAD WITHOUT CONTRAST MRA HEAD WITHOUT CONTRAST TECHNIQUE: Multiplanar, multiecho pulse sequences of the brain and surrounding structures were obtained without intravenous contrast. Angiographic images of the head were obtained using MRA technique without contrast. COMPARISON:  Prior CT from 05/25/2017. FINDINGS: MRI HEAD FINDINGS Brain: Study moderately degraded by motion artifact. Diffuse prominence of the CSF containing spaces compatible with generalized cerebral atrophy. Patchy and confluent T2/FLAIR hyperintensity within the periventricular and deep white matter both cerebral hemispheres most compatible chronic small vessel ischemic disease, fairly advanced in nature. Few scattered remote bilateral cerebellar infarcts. Remote lacunar infarct present within the left thalamus. Patchy small volume acute ischemic infarcts  present within the left cerebellar hemisphere (series 4, image 9). Largest area of infarction measures 6 mm. No associated hemorrhage or mass effect. No other evidence for acute or subacute infarct. Gray-white matter differentiation otherwise maintained. No other evidence for acute or chronic intracranial hemorrhage. No other areas of chronic infarction. No mass lesion, midline shift or mass effect. No hydrocephalus. No extra-axial fluid collection. Major dural sinuses grossly patent. Pituitary gland normal.  Midline structures intact and normal. Vascular: Major intracranial vascular flow voids maintained. Skull and upper cervical spine: Degenerative thickening at the tectorial membrane with mild narrowing at the craniocervical junction. Visualized upper cervical spine grossly unremarkable. Bone marrow signal intensity within normal limits. No scalp soft tissue abnormality. Sinuses/Orbits: Globes and orbital soft tissues within normal limits. Patient status post cataract extraction bilaterally. Paranasal sinuses clear. No mastoid effusion. Inner ear structures grossly normal. Other: None. MRA HEAD FINDINGS ANTERIOR CIRCULATION: Study degraded by motion artifact. Distal cervical segments of the internal carotid arteries are patent with antegrade flow. Petrous, cavernous and supraclinoid segments patent without flow-limiting stenosis. A1 segments widely patent. Normal anterior communicating artery. Anterior cerebral arteries patent to their distal aspects without stenosis. M1 segments demonstrate probable atheromatous irregularity without high-grade flow-limiting stenosis. Normal MCA bifurcations. No proximal M2 occlusion. Distal MCA branches well perfused and symmetric. POSTERIOR CIRCULATION: Vertebral arteries patent to the vertebrobasilar junction. Left vertebral artery dominant. Posterior inferior cerebral arteries patent proximally. Basilar artery patent to its distal aspect without stenosis. Superior cerebral  arteries patent proximally. Right PCA supplied via the basilar as well as a prominent right posterior communicating artery. Fetal type origin of the left PCA. PCAs patent to their distal aspects without stenosis. No intracranial aneurysm. IMPRESSION: MRI HEAD IMPRESSION: 1. Patchy small volume acute ischemic nonhemorrhagic left cerebellar infarcts. 2. Remote lacunar infarcts involving the left thalamus and bilateral cerebellar hemispheres. 3. Age-related atrophy with advanced chronic microvascular ischemic disease. MRA HEAD IMPRESSION: Negative intracranial MRA. No large vessel occlusion. No high-grade or correctable stenosis. Electronically Signed   By: Jeannine Boga M.D.   On: 05/26/2017 01:23   Dg Chest Port 1 View  Result Date: 05/25/2017 CLINICAL DATA:  Atrial fibrillation EXAM: PORTABLE CHEST 1 VIEW COMPARISON:  11/10/2016 FINDINGS: Cardiomegaly with vascular congestion. Left lower lobe atelectasis or infiltrate. Right lung is clear. No acute bony abnormality. Advanced degenerative changes in the shoulders. IMPRESSION: Cardiomegaly with vascular congestion. Left lower lobe atelectasis or infiltrate. This is similar to prior study. Electronically Signed   By: Rolm Baptise M.D.   On: 05/25/2017 09:13   Mr Virgel Paling TK Contrast  Result Date: 05/26/2017 CLINICAL DATA:  Initial evaluation for acute speech difficulty. EXAM: MRI HEAD WITHOUT CONTRAST  MRA HEAD WITHOUT CONTRAST TECHNIQUE: Multiplanar, multiecho pulse sequences of the brain and surrounding structures were obtained without intravenous contrast. Angiographic images of the head were obtained using MRA technique without contrast. COMPARISON:  Prior CT from 05/25/2017. FINDINGS: MRI HEAD FINDINGS Brain: Study moderately degraded by motion artifact. Diffuse prominence of the CSF containing spaces compatible with generalized cerebral atrophy. Patchy and confluent T2/FLAIR hyperintensity within the periventricular and deep white matter both  cerebral hemispheres most compatible chronic small vessel ischemic disease, fairly advanced in nature. Few scattered remote bilateral cerebellar infarcts. Remote lacunar infarct present within the left thalamus. Patchy small volume acute ischemic infarcts present within the left cerebellar hemisphere (series 4, image 9). Largest area of infarction measures 6 mm. No associated hemorrhage or mass effect. No other evidence for acute or subacute infarct. Gray-white matter differentiation otherwise maintained. No other evidence for acute or chronic intracranial hemorrhage. No other areas of chronic infarction. No mass lesion, midline shift or mass effect. No hydrocephalus. No extra-axial fluid collection. Major dural sinuses grossly patent. Pituitary gland normal.  Midline structures intact and normal. Vascular: Major intracranial vascular flow voids maintained. Skull and upper cervical spine: Degenerative thickening at the tectorial membrane with mild narrowing at the craniocervical junction. Visualized upper cervical spine grossly unremarkable. Bone marrow signal intensity within normal limits. No scalp soft tissue abnormality. Sinuses/Orbits: Globes and orbital soft tissues within normal limits. Patient status post cataract extraction bilaterally. Paranasal sinuses clear. No mastoid effusion. Inner ear structures grossly normal. Other: None. MRA HEAD FINDINGS ANTERIOR CIRCULATION: Study degraded by motion artifact. Distal cervical segments of the internal carotid arteries are patent with antegrade flow. Petrous, cavernous and supraclinoid segments patent without flow-limiting stenosis. A1 segments widely patent. Normal anterior communicating artery. Anterior cerebral arteries patent to their distal aspects without stenosis. M1 segments demonstrate probable atheromatous irregularity without high-grade flow-limiting stenosis. Normal MCA bifurcations. No proximal M2 occlusion. Distal MCA branches well perfused and  symmetric. POSTERIOR CIRCULATION: Vertebral arteries patent to the vertebrobasilar junction. Left vertebral artery dominant. Posterior inferior cerebral arteries patent proximally. Basilar artery patent to its distal aspect without stenosis. Superior cerebral arteries patent proximally. Right PCA supplied via the basilar as well as a prominent right posterior communicating artery. Fetal type origin of the left PCA. PCAs patent to their distal aspects without stenosis. No intracranial aneurysm. IMPRESSION: MRI HEAD IMPRESSION: 1. Patchy small volume acute ischemic nonhemorrhagic left cerebellar infarcts. 2. Remote lacunar infarcts involving the left thalamus and bilateral cerebellar hemispheres. 3. Age-related atrophy with advanced chronic microvascular ischemic disease. MRA HEAD IMPRESSION: Negative intracranial MRA. No large vessel occlusion. No high-grade or correctable stenosis. Electronically Signed   By: Jeannine Boga M.D.   On: 05/26/2017 01:23    Scheduled Meds: . aspirin EC  325 mg Oral Daily   Or  . aspirin  300 mg Rectal Daily  . busPIRone  5 mg Oral Daily  . diltiazem  180 mg Oral Daily  . metoprolol succinate  50 mg Oral Daily  . [START ON 05/28/2017] pantoprazole  40 mg Intravenous Q12H  . predniSONE  5 mg Oral Q breakfast    Continuous Infusions: . sodium chloride 50 mL/hr at 05/25/17 2319  . pantoprozole (PROTONIX) infusion 8 mg/hr (05/26/17 0010)     LOS: 1 day     Kayleen Memos, MD [facility] Pager [phone]  If 7PM-7AM, please contact night-coverage [email or web address] Password [facility] 05/26/2017, 8:49 AM

## 2017-05-26 NOTE — Op Note (Signed)
EGD was performed for melena and anemia.  Barrett's esophagus, 2 cm, along with distal esophagitis noted. Biopsies were not taken as patient was on aspirin 325 mg. 4 cm hiatal hernia. Small amount of fresh blood was noted as the scope was advanced. Erythematous mucosa was evident in the antrum, incisura angularis, fundus, cardia and gastric body. Biopsies were not taken for H. pylori as patient was on aspirin 325 mg. 2 cratered ulcers noted at duodenal sweep, largest 8 mm in size. Both had clean bases, one had minimal oozing noted. Were treated with epinephrine 1 in 10,000, total 4 mL and placement of 3 endoclips. No bleeding was noted at the end of the procedure. Rest of the duodenum appeared unremarkable.  Recommend: Clear liquid diet today, to be advanced in a.m. if there is no further evidence of bleeding. Continue Protonix drip another 48 hours. Avoid aspirin if possible, avoid NSAIDs if possible. H. pylori serology, treat if noted as an outpatient. Patient will need to be on PPI twice a day if aspirin is to be continued.  [name], M.D.

## 2017-05-26 NOTE — Anesthesia Preprocedure Evaluation (Addendum)
Anesthesia Evaluation  Patient identified by MRN, date of birth, ID band Patient awake    Reviewed: Allergy & Precautions, NPO status , Patient's Chart, lab work & pertinent test results  Airway Mallampati: II  TM Distance: >3 FB Neck ROM: Full    Dental no notable dental hx. (+) Poor Dentition, Missing, Edentulous Lower   Pulmonary neg pulmonary ROS,    Pulmonary exam normal breath sounds clear to auscultation       Cardiovascular hypertension, Pt. on medications +CHF  negative cardio ROS Normal cardiovascular exam+ dysrhythmias Atrial Fibrillation  Rhythm:Regular Rate:Normal     Neuro/Psych TIACVA, Residual Symptoms negative neurological ROS  negative psych ROS   GI/Hepatic negative GI ROS, Neg liver ROS,   Endo/Other  negative endocrine ROS  Renal/GU negative Renal ROS  negative genitourinary   Musculoskeletal negative musculoskeletal ROS (+) Arthritis , Osteoarthritis,    Abdominal   Peds negative pediatric ROS (+)  Hematology negative hematology ROS (+)   Anesthesia Other Findings   Reproductive/Obstetrics negative OB ROS                            Anesthesia Physical Anesthesia Plan  ASA: III  Anesthesia Plan: MAC   Post-op Pain Management:    Induction:   PONV Risk Score and Plan: Ondansetron  Airway Management Planned: Nasal Cannula, Natural Airway and Simple Face Mask  Additional Equipment:   Intra-op Plan:   Post-operative Plan:   Informed Consent:   Plan Discussed with:   Anesthesia Plan Comments:         Anesthesia Quick Evaluation

## 2017-05-26 NOTE — Interval H&P Note (Signed)
History and Physical Interval Note: 89/female with melena and anemia for EGD.  05/26/2017 12:54 PM  Krystal Hensley  has presented today for EGD, with the diagnosis of anemia, melena  The various methods of treatment have been discussed with the patient and family. After consideration of risks, benefits and other options for treatment, the patient has consented to  Procedure(s): ESOPHAGOGASTRODUODENOSCOPY (EGD) WITH PROPOFOL (N/A) as a surgical intervention .  The patient's history has been reviewed, patient examined, no change in status, stable for surgery.  I have reviewed the patient's chart and labs.  Questions were answered to the patient's satisfaction.     [name]

## 2017-05-26 NOTE — Progress Notes (Signed)
OT Evaluation  PTA, pt lived at home with PCA arranged as needed, who assisted with self care and mobility. Pt appears to be functioning close to her baseline ADL level with use of rollator. Feel pt safe to DC home with current set up of PCA when medically stable. Recommend nursing ambulate pt to bathroom with use of rollator.   05/26/17 1100  OT Visit Information  Last OT Received On 05/26/17  Assistance Needed +1  History of Present Illness 83 y.o. female admitted with difficulty speaking (expressive aphasia) and dark stool. Upon arrival, hemoglobin 6.8, concern for GI bleed, and A-fib present with RVR. Head CT negative. Brain MRI shows small acute ischemic changes of L cerebellum and thalamus. PMH significant of CHF, HTN, PAF, pulmonary HTN, CVA, colon cancer, and polymyalgia rheumatica.    Precautions  Precautions Fall  Restrictions  Weight Bearing Restrictions No  Home Living  Family/patient expects to be discharged to: Private residence  Living Arrangements Alone  Available Help at Discharge Available 24 hours/day;Personal care attendant;Family  Type of [place] to enter  Entrance Stairs-Number of Steps 2  Entrance Stairs-Rails Right;Left;Can reach both  [place] One level  Bathroom Biomedical scientist Yes  How Accessible Accessible via walker  [place] - 4 wheels;Grab bars - toilet;Grab bars - tub/shower;Walker - 2 wheels;Toilet riser;Shower seat;Hospital bed;Wheelchair - manual;Cane - single point  Prior Function  Level of [place] Needs assistance  ADL's / [place] personal care assistant helps with bathing and ADLS as needed during the day  Comments knitting and crafts; PCA arranged PTA  Communication  Communication No difficulties  Pain Assessment  Pain Assessment No/denies pain  Cognition  Arousal/Alertness Awake/alert  Behavior During Therapy WFL for  tasks assessed/performed  Overall Cognitive Status History of cognitive impairments - at baseline  General Comments Caregiver states pt is at baeline cognitively  Upper Extremity Assessment  Upper Extremity Assessment Generalized weakness (Apparent RTC insufficiencies in B shoulders)  Cervical / Trunk Assessment  Cervical / Trunk Assessment Kyphotic  ADL  Overall ADL's  At baseline  General ADL Comments @ rollator level  Bed Mobility  General bed mobility comments OOB in chair  Transfers  Overall transfer level Needs assistance  Equipment used 4-wheeled walker  Transfers Sit to/from Stand;Stand Pivot Transfers  Stand pivot transfers Supervision  Balance  Sitting balance-Leahy Scale Good  Standing balance-Leahy Scale Poor  Standing balance comment reliant on rollator  OT - End of Session  Equipment Utilized During Treatment Gait belt;Other (comment) (rollator)  Activity Tolerance Patient tolerated treatment well  Patient left in bed;with call bell/phone within reach;with family/visitor present  Nurse Communication Mobility status  OT Assessment  OT Recommendation/Assessment Patient does not need any further OT services  OT Visit Diagnosis Unsteadiness on feet (R26.81)  OT Problem List Impaired balance (sitting and/or standing);Decreased activity tolerance;Decreased safety awareness  AM-PAC OT "6 Clicks" Daily Activity Outcome Measure  Help from another person eating meals? 4  Help from another person taking care of personal grooming? 3  Help from another person toileting, which includes using toliet, bedpan, or urinal? 3  Help from another person bathing (including washing, rinsing, drying)? 3  Help from another person to put on and taking off regular upper body clothing? 3  Help from another person to put on and taking off regular lower body clothing? 2  6 Click Score 18  ADL G Code Conversion CK  OT Recommendation  Follow Up Recommendations No OT follow  up;Supervision/Assistance - 24 hour  OT Equipment None recommended by OT  Acute Rehab OT Goals  Patient Stated Goal to return home  OT Goal Formulation All assessment and education complete, DC therapy  OT Time Calculation  OT Start Time (ACUTE ONLY) 0942  OT Stop Time (ACUTE ONLY) 1006  OT Time Calculation (min) 24 min  OT General Charges  $OT Visit 1 Visit  OT Evaluation  $OT Eval Low Complexity 1 Low  OT Treatments  $Self Care/Home Management  8-22 mins  Written Expression  Dominant Hand Right  [facility], OT/L  [phone] 05/26/2017

## 2017-05-26 NOTE — Op Note (Signed)
[facility] Patient Name: Krystal Hensley Procedure Date : 05/26/2017 MRN: [id number] Attending MD: [name] , MD Date of Birth: 02-Oct-1917 CSN: [id number] Age: 83 Admit Type: Inpatient Procedure:                Upper GI endoscopy Indications:              Acute post hemorrhagic anemia, Melena Providers:                [name], MD, Vista Lawman, RN, Alan Mulder,                            Technician Referring MD:              Medicines:                Monitored Anesthesia Care Complications:            No immediate complications. Estimated Blood Loss:     Estimated blood loss: none. Procedure:                Pre-Anesthesia Assessment:                           - Prior to the procedure, a History and Physical                            was performed, and patient medications and                            allergies were reviewed. The patient's tolerance of                            previous anesthesia was also reviewed. The risks                            and benefits of the procedure and the sedation                            options and risks were discussed with the patient.                            All questions were answered, and informed consent                            was obtained. Prior Anticoagulants: The patient has                            taken aspirin, last dose was day of procedure. ASA                            Grade Assessment: III - A patient with severe                            systemic disease. After reviewing the risks and  benefits, the patient was deemed in satisfactory                            condition to undergo the procedure.                           After obtaining informed consent, the endoscope was                            passed under direct vision. Throughout the                            procedure, the patient's blood pressure, pulse, and                            oxygen saturations were  monitored continuously. The                            EG-[id number] ([id number]) scope was introduced through the                            mouth, and advanced to the third part of duodenum.                            The upper GI endoscopy was accomplished without                            difficulty. The patient tolerated the procedure                            well. Scope In: Scope Out: Findings:      There were esophageal mucosal changes suggestive of short-segment       Barrett's esophagus present in the lower third of the esophagus. The       maximum longitudinal extent of these mucosal changes was 2 cm in length.       Biopsy is contraindicated because patient is on ASA 325 mg for stroke.      Small amount of fresh blood was noted in the gastric cavity during       advancement of the scope.      LA Grade B (one or more mucosal breaks greater than 5 mm, not extending       between the tops of two mucosal folds) esophagitis was found in the       distal esophagus, near GE junction with no bleeding was found.      A 4 cm hiatal hernia was present.      Diffuse moderately erythematous mucosa without bleeding was found in the       cardia, in the gastric fundus, in the gastric body and in the gastric       antrum.      Two cratered duodenal ulcers with minimal oozing hemorrhage (Forrest       Class Ib) were found in one of the ulcers, in the first portion of the       duodenum(duodenal sweep). The largest lesion was 8 mm in largest       dimension.  Area was successfully injected with 4 mL of a 1:10,000       solution of epinephrine for hemostasis. For hemostasis, three hemostatic       clips were successfully placed. There was no bleeding at the end of the       procedure. Impression:               - Esophageal mucosal changes suggestive of                            short-segment Barrett's esophagus. Biopsy is                            contraindicated.                           - LA  Grade B esophagitis.                           - 4 cm hiatal hernia.                           - Erythematous mucosa in the cardia, gastric                            fundus, gastric body and antrum.                           - Multiple oozing duodenal ulcers with oozing                            hemorrhage (Forrest Class Ib). Injected. Clips were                            placed.                           - No specimens collected. Moderate Sedation:      Patient did not receive moderate sedation for this procedure, but       instead received monitored anesthesia care. Recommendation:           - Clear liquid diet today.                           - Continue present medications.                           - H pylori serology and if positive recommend                            treatment as an outpatient.                           Protonix drip for another 48 hours, after that                            needs PPI BID as long as ASA is continued.                           -  Return patient to hospital ward for ongoing care. Procedure Code(s):        --- Professional ---                           [phone], Esophagogastroduodenoscopy, flexible,                            transoral; with control of bleeding, any method Diagnosis Code(s):        --- Professional ---                           K22.8, Other specified diseases of esophagus                           K20.9, Esophagitis, unspecified                           K44.9, Diaphragmatic hernia without obstruction or                            gangrene                           K31.89, Other diseases of stomach and duodenum                           K26.4, Chronic or unspecified duodenal ulcer with                            hemorrhage                           D62, Acute posthemorrhagic anemia                           K92.1, Melena (includes Hematochezia) CPT copyright 2016 American Medical Association. All rights reserved. The codes  documented in this report are preliminary and upon coder review may  be revised to meet current compliance requirements. [name], MD 05/26/2017 1:37:50 PM This report has been signed electronically. Number of Addenda: 0

## 2017-05-26 NOTE — Progress Notes (Signed)
STROKE TEAM PROGRESS NOTE   SUBJECTIVE (INTERVAL HISTORY) Home caregiver is at the bedside. Patient is found laying in bed in NAD  Overall she feels her condition is completely resolved. Patient denies any trouble communicating today. Denies any new weakness. Voices no new complaints. Given 2 units of blood overnight fo low H & H. Upper GI this AM per nurse report.  Long discussion with son by phone, Charletta Cousin. Explained risk of future strokes while not on anticoagulation. Patient will follow up in outpatient Neurology clinic in 6 weeks to discuss low dose Eliquis if anemia resolved at that time.  OBJECTIVE Lab Results: CBC:  Recent Labs  Lab 05/25/17 0720 05/25/17 2220 05/26/17 0442  WBC 11.5*  12.5* 12.4* 11.9*  HGB 6.6*  6.8* 9.2* 8.9*  HCT 20.8*  21.7* 27.2* 27.9*  MCV 104.0*  103.8* 100.4* 96.9  PLT 201  216 179 182   BMP: Recent Labs  Lab 05/25/17 0720 05/25/17 2220 05/26/17 0442  NA 141 138 139  K 3.9 4.3 3.9  CL 111 111 111  CO2 21* 21* 20*  GLUCOSE 153* 120* 101*  BUN 33* 29* 27*  CREATININE 0.73 0.64 0.61  CALCIUM 8.1* 7.9* 7.9*  MG 2.3  --   --   PHOS 3.0  --   --    Coagulation Studies:  Recent Labs    05/25/17 0720  APTT 26  INR 1.18   PHYSICAL EXAM Temp:  [97.3 F (36.3 C)-98.9 F (37.2 C)] 98.9 F (37.2 C) (11/20 0652) Pulse Rate:  [59-149] 90 (11/20 1157) Resp:  [18-38] 18 (11/20 1157) BP: (81-125)/(55-93) 125/67 (11/20 1157) SpO2:  [82 %-100 %] 97 % (11/20 1157) Weight:  [49.8 kg (109 lb 12.6 oz)] 49.8 kg (109 lb 12.6 oz) (11/19 2300) General - Well nourished, well developed, in no apparent distress Respiratory - +crackles at bases bilaterally. No wheezing. Cardiovascular - Iregular rate and rhythm   Neurological Examination Mental Status: Alert, oriented to hospital, is aware that it is the month of Thanksgiving but continues to state it is January.  Does not know the year.  Is able to follow commands.  Speech fluent without  evidence of aphasia.   Cranial Nerves: II: ; Visual fields grossly normal,  III,IV, VI: ptosis present on the right, extra-ocular motions intact bilaterally, pupils irregular secondary to surgery with the right pupil being 3 mm and left pupil being 2 mm minimally reactive to light bilaterally V,VII: smile symmetric, facial light touch sensation normal bilaterally VIII: hearing normal bilaterally IX,X: uvula rises symmetrically XI: bilateral shoulder shrug XII: midline tongue extension Motor: Right :  Upper extremity   4/5                                      Left:     Upper extremity   4/5             Lower extremity   5/5                                                  Lower extremity   5/5 Tone and bulk:normal tone throughout; no atrophy noted Sensory: Pinprick and light touch intact throughout, bilaterally Deep Tendon Reflexes: Hyporeflexic throughout Plantars: Mute bilaterally Cerebellar: Difficult to  test secondary to weakness. Finger-nose testing did not show any significant dysmetria. She did have a tremor and demonstrated weakness of upper extremities as she got to her nose Gait: Not tested  IMAGING: I have personally reviewed the radiological images below and agree with the radiology interpretations.  Ct Head Wo Contrast Result Date: 05/25/2017 IMPRESSION: Mild diffuse cortical atrophy. Mild chronic ischemic white matter disease. No acute intracranial abnormality seen.   MRI/MRA Brain Wo Contrast Result Date: 05/26/2017 IMPRESSION: MRI HEAD IMPRESSION: 1. Patchy small volume acute ischemic nonhemorrhagic left cerebellar infarcts. 2. Remote lacunar infarcts involving the left thalamus and bilateral cerebellar hemispheres. 3. Age-related atrophy with advanced chronic microvascular ischemic disease. MRA HEAD IMPRESSION: Negative intracranial MRA. No large vessel occlusion. No high-grade or correctable stenosis.   Echocardiogram: not done                            PENDING  B/L  Carotid U/S:                                                 Bilateral 1-39% ICA stenosis, right vertebral artery not visualized, left vertebral antegrade flow. _____________________________________________________________________ ASSESSMENT: Ms. Krystal Hensley is a 83 y.o. female with PMH of prior  Admitted to the hospital with complaint of: Altered Mental Status  Patchy, small, Acute ischemic nonhemorrhagic Left cerebellar infarcts. Remote lacunar infarcts Left thalamus and bilateral cerebellar hemispheres.   Suspected Etiology: AFIB not on anticoagulation due to high fall risk. Resultant Symptoms: All symptoms have resolved since admission Stroke Risk Factors: atrial fibrillation and hypertension Other Stroke Risk Factors: Advanced age  Outstanding Stroke Work-up Studies: Echocardiogram: not done               PENDING  PLAN  05/26/2017: Continue Aspirin Ongoing aggressive stroke risk factor management Patient's son counseled to be compliant with her antithrombotic medications  AFIB, CHRONIC: Continue Metoprolol for Rate control as needed Antiplatelet med: Continue aspirin.   Patient is a fall risk and currently anemic, thus is not a candidate for anticoagulation Disused with son Marya Amsler the possibility of start low does Eliquis in the future. Will discuss at follow up Neurology appointment in 6 weeks.  HYPERTENSION: Stable Permissive hypertension (OK if <220/120) for 24-48 hours post stroke and then gradually normalized within 5-7 days. Long term BP goal normotensive. May slowly restart home B/P medications after 48 hours Home Meds: Cardizem, Metoprolol  HYPERLIPIDEMIA:    Component Value Date/Time   CHOL 116 05/26/2017 0442   TRIG 160 (H) 05/26/2017 0442   HDL 27 (L) 05/26/2017 0442   CHOLHDL 4.3 05/26/2017 0442   VLDL 32 05/26/2017 0442   LDLCALC 57 05/26/2017 0442  Home Meds:  NONE - Has documented statin allergy LDL  goal < 70  R/O DIABETES: Lab Results   Component Value Date   HGBA1C 5.4 05/26/2017  HgbA1c goal < 7.0  Other Active Problems: Active Problems:   PMR (polymyalgia rheumatica) (HCC)   Hx of PAF- CHADS VASC=7   Atrial fibrillation with RVR (HCC)   Chronic diastolic CHF (congestive heart failure) (HCC)   TIA (transient ischemic attack)   History of CVA (cerebrovascular accident)   Symptomatic anemia   Upper GI bleed  Hospital day # 1  VTE prophylaxis: SCD's  Diet : Diet NPO time specified  Except for: Sips with Meds   Prior Home Stroke Medications:  aspirin 81 mg daily Prior to Admission  Hospital Current Stroke Medications:ASA 325 mg, no statin due to allergy Stroke New Meds Plan: Now on aspirin 325 mg daily  Discharge Stroke Meds: Please discharge patient on aspirin 325 mg daily   Disposition: 01-Home or Self Care Therapy Recs:   Follow Recs:  Lajean Manes, MD Follow up with [facility] Neurology Stroke Clinic in 6 weeks  FAMILY UPDATES: No family at bedside, phone conversation with son Marya Amsler by phone regarding possibility of starting Eliquis in the future  TEAM UPDATES: Kayleen Memos, DO STATUS:  DNR  Renie Ora Stroke Neurology Team 05/26/2017 12:33 PM   Attending Note:  83 year old female with history of atrial fibrillation on aspirin, HTN, HLD, PMR, dementia and a history of stroke admitted for transient speech difficulties.  She had a stroke in 08/2013 with right arm weakness, MRI showed left scattered frontal infarct, due to A. fib not on anticoagulation.  MRA negative.  Continue aspirin due to fall risk.  On this admission, CT negative for acute abnormality.  MRI showed left punctate cerebellar acute infarct, remote lacunar infarct involving left thalamus and bilateral cerebellum.  MRI negative.  Carotid Doppler no ICA stenosis.  LDL 57 and A1c 5.4.   However, she was found to have severe anemia, hemoglobin 6.6, received blood transfusion, currently hemoglobin 8.6.  Had upper GI workup found to  have multiple oozing duodenal ulcers with hemorrhage, which has been clipped.  On Protonix drip for 48 hours.  Discussed with daughter and caregiver, patient has been refused Eliquis recently with cardiology follow-up.  With her current anemia requiring transfusion, fall risk, advanced age, and her willingness not to be on anticoagulation, we will not recommend anti-correlation at this time.  Consider resume aspirin on discharge with GI clearance.   I had a long discussion with daughter and caregiver regarding the benefit and risk of anticoagulation, they fully understand at this time and agrees that no anticoagulation will be considered at this time.  She will follow-up in stroke clinic at [facility].   Neurology will sign off. Please call with questions. Pt will follow up with Cecille Rubin, NP, at [facility] in about 6 weeks. Thanks for the consult.   Rosalin Hawking, MD PhD Stroke Neurology 05/26/2017 6:49 PM   To contact Stroke Continuity provider, please refer to [email or web address] After hours, contact General Neurology

## 2017-05-26 NOTE — Progress Notes (Signed)
*  PRELIMINARY RESULTS* Vascular Ultrasound Carotid Duplex (Doppler) has been completed.  Preliminary findings: Bilateral 1-39% ICA stenosis, right vertebral artery not visualized, left vertebral antegrade flow.   Everrett Coombe 05/26/2017, 11:47 AM

## 2017-05-26 NOTE — Plan of Care (Signed)
Pt slightly confused but follows simple command

## 2017-05-26 NOTE — Progress Notes (Signed)
Pt transferred from [facility] at 2240 with the diagnosis of TIA, Pt alert, denies any pain, settled in bed with call light within pt's reach, post transfusion hemoglobin drawn prior to transfer, came back 9.2, NP Boden[place]ir (on call) paged and notified, ordered to hold 3rd unit of PRBC, pt reassured, will however continue to monitor. Krystal Hensley, Krystal Hensley

## 2017-05-26 NOTE — Evaluation (Signed)
Physical Therapy Evaluation Patient Details Name: Krystal Hensley MRN: [id number] DOB: 11/12/1917 Today's Date: 05/26/2017   History of Present Illness  83 y.o. female admitted with difficulty speaking (expressive aphasia) and dark stool. Upon arrival, hemoglobin 6.8, concern for GI bleed, and A-fib present with RVR. Head CT negative. Brain MRI shows small acute ischemic changes of L cerebellum and thalamus. PMH significant of CHF, HTN, PAF, pulmonary HTN, CVA, colon cancer, and polymyalgia rheumatica.    Clinical Impression  Pt presents with generalized muscular weakness, balance deficits, and impaired mobility secondary to above. Pt tolerated ambulation to/from bed and bathroom with RW and min assist, however became dyspneic upon standing from EOB (4/4 DOE), so ambulation was ceased and stand-pivot transfer utilized to get pt into chair. Pt demonstrates unsteadiness of gait and difficulty maneuvering RW. Pt is a good candidate for skilled PT in order to improve upon  aforementioned deficits. Home health being recommended for safe mobility around home environment.     Follow Up Recommendations Home health PT;Supervision/Assistance - 24 hour    Equipment Recommendations  None recommended by PT    Recommendations for Other Services       Precautions / Restrictions Precautions Precautions: Fall Restrictions Weight Bearing Restrictions: No      Mobility  Bed Mobility Overal bed mobility: Needs Assistance Bed Mobility: Supine to Sit     Supine to sit: Min assist;HOB elevated     General bed mobility comments: Pt requires increased time and use of hand-rail + hand-held assist for supine to sit. First supine-to-sit attempt unsuccesful. Second successful. VCs to roll and push from bed.   Transfers Overall transfer level: Needs assistance Equipment used: Rolling walker (2 wheeled) Transfers: Sit to/from [facility]   Stand pivot transfers: Min assist        General transfer comment: Pt demsontrates difficulty with maneuvering 2-wheeled walker. sit-to-stand from EOB x2, from commode x1. VCs to push from hands on stable surface. Pt became dyspneac after 3rd sit-to-stand (4/4 DOE) and stand-pivot transfer with few shuffled steps from EOB to chair initiated.   Ambulation/Gait Ambulation/Gait assistance: Min assist Ambulation Distance (Feet): 20 Feet(+ 20') Assistive device: Rolling walker (2 wheeled) Gait Pattern/deviations: Step-through pattern;Trunk flexed;Narrow base of support;Decreased stride length Gait velocity: decreased   General Gait Details: Pt demonstrates general unsteady gait and difficulty manuevering RW. Pt leaning towards left side.  Stairs            Wheelchair Mobility    Modified Rankin (Stroke Patients Only)       Balance Overall balance assessment: Needs assistance Sitting-balance support: Feet supported;Bilateral upper extremity supported Sitting balance-Leahy Scale: Poor Sitting balance - Comments: Pt able to sit EOB with feet supported and BUEs with contact on bed. PT supervision for safety.    Standing balance support: Bilateral upper extremity supported;During functional activity Standing balance-Leahy Scale: Poor Standing balance comment: Pt with BUEs on RW in order to maintain standing balance.                              Pertinent Vitals/Pain Pain Assessment: No/denies pain    Home Living Family/patient expects to be discharged to:: Private residence Living Arrangements: Alone Available Help at Discharge: Available 24 hours/day;Personal care attendant;Family Type of Home: House Home Access: Stairs to enter Entrance Stairs-Rails: Right;Left;Can reach both Entrance Stairs-Number of Steps: 2 Home Layout: One level Home Equipment: Walker - 4 wheels;Grab bars - toilet;Grab bars -  tub/shower;Walker - 2 wheels;Toilet riser;Shower seat;Hospital bed;Wheelchair - manual;Cane - single point       Prior Function Level of Independence: Needs assistance      ADL's / Homemaking Assistance Needed: personal care assistant helps with bathing and ADLS as needed during the day        Hand Dominance   Dominant Hand: Right    Extremity/Trunk Assessment   Upper Extremity Assessment Upper Extremity Assessment: Defer to OT evaluation    Lower Extremity Assessment Lower Extremity Assessment: Generalized weakness    Cervical / Trunk Assessment Cervical / Trunk Assessment: Kyphotic  Communication      Cognition Arousal/Alertness: Awake/alert Behavior During Therapy: Impulsive;WFL for tasks assessed/performed Overall Cognitive Status: Within Functional Limits for tasks assessed                                 General Comments: Pt is talkative throughout session. Pt initiates movements (up from toilet, up from bed) before PT instruction.       General Comments General comments (skin integrity, edema, etc.): Caregiver in room during session.     Exercises     Assessment/Plan    PT Assessment Patient needs continued PT services  PT Problem List Decreased strength;Decreased mobility;Decreased safety awareness;Decreased balance;Cardiopulmonary status limiting activity       PT Treatment Interventions DME instruction;Therapeutic activities;Gait training;Therapeutic exercise;Patient/family education;Balance training;Stair training;Functional mobility training;Neuromuscular re-education;Wheelchair mobility training    PT Goals (Current goals can be found in the Care Plan section)  Acute Rehab PT Goals Patient Stated Goal: to return home PT Goal Formulation: With patient Time For Goal Achievement: 06/09/17 Potential to Achieve Goals: Good    Frequency Min 3X/week   Barriers to discharge        Co-evaluation               AM-PAC PT "6 Clicks" Daily Activity  Outcome Measure Difficulty turning over in bed (including adjusting bedclothes, sheets  and blankets)?: A Little Difficulty moving from lying on back to sitting on the side of the bed? : Unable Difficulty sitting down on and standing up from a chair with arms (e.g., wheelchair, bedside commode, etc,.)?: Unable Help needed moving to and from a bed to chair (including a wheelchair)?: A Little Help needed walking in hospital room?: A Little Help needed climbing 3-5 steps with a railing? : A Lot 6 Click Score: 13    End of Session Equipment Utilized During Treatment: Gait belt Activity Tolerance: Patient limited by fatigue;Patient tolerated treatment well Patient left: in chair;with family/visitor present;with call bell/phone within reach   PT Visit Diagnosis: Unsteadiness on feet (R26.81);Muscle weakness (generalized) (M62.81);Difficulty in walking, not elsewhere classified (R26.2);Other abnormalities of gait and mobility (R26.89)    Time: 9371-6967 PT Time Calculation (min) (ACUTE ONLY): 31 min   Charges:   PT Evaluation $PT Eval Moderate Complexity: 1 Mod PT Treatments $Gait Training: 8-22 mins   PT G Codes:        Judee Clara, SPT  Judee Clara 05/26/2017, 10:41 AM

## 2017-05-27 ENCOUNTER — Inpatient Hospital Stay (HOSPITAL_COMMUNITY): Payer: Medicare Other

## 2017-05-27 DIAGNOSIS — I4891 Unspecified atrial fibrillation: Secondary | ICD-10-CM

## 2017-05-27 DIAGNOSIS — K921 Melena: Secondary | ICD-10-CM

## 2017-05-27 DIAGNOSIS — K922 Gastrointestinal hemorrhage, unspecified: Secondary | ICD-10-CM

## 2017-05-27 DIAGNOSIS — I480 Paroxysmal atrial fibrillation: Secondary | ICD-10-CM

## 2017-05-27 DIAGNOSIS — I361 Nonrheumatic tricuspid (valve) insufficiency: Secondary | ICD-10-CM

## 2017-05-27 DIAGNOSIS — I5032 Chronic diastolic (congestive) heart failure: Secondary | ICD-10-CM

## 2017-05-27 DIAGNOSIS — G459 Transient cerebral ischemic attack, unspecified: Secondary | ICD-10-CM

## 2017-05-27 LAB — ECHOCARDIOGRAM COMPLETE
Height: 61 in
Weight: 1756.63 oz

## 2017-05-27 LAB — BASIC METABOLIC PANEL
ANION GAP: 6 (ref 5–15)
BUN: 21 mg/dL — ABNORMAL HIGH (ref 6–20)
CALCIUM: 7.8 mg/dL — AB (ref 8.9–10.3)
CO2: 22 mmol/L (ref 22–32)
Chloride: 112 mmol/L — ABNORMAL HIGH (ref 101–111)
Creatinine, Ser: 0.6 mg/dL (ref 0.44–1.00)
Glucose, Bld: 83 mg/dL (ref 65–99)
POTASSIUM: 3.5 mmol/L (ref 3.5–5.1)
Sodium: 140 mmol/L (ref 135–145)

## 2017-05-27 LAB — CBC
HCT: 27.1 % — ABNORMAL LOW (ref 36.0–46.0)
Hemoglobin: 8.6 g/dL — ABNORMAL LOW (ref 12.0–15.0)
MCH: 32.1 pg (ref 26.0–34.0)
MCHC: 31.7 g/dL (ref 30.0–36.0)
MCV: 101.1 fL — ABNORMAL HIGH (ref 78.0–100.0)
PLATELETS: 175 10*3/uL (ref 150–400)
RBC: 2.68 MIL/uL — ABNORMAL LOW (ref 3.87–5.11)
RDW: 17.9 % — AB (ref 11.5–15.5)
WBC: 9.7 10*3/uL (ref 4.0–10.5)

## 2017-05-27 MED ORDER — FUROSEMIDE 20 MG PO TABS
20.0000 mg | ORAL_TABLET | Freq: Every day | ORAL | Status: DC
  Administered 2017-05-27 – 2017-05-29 (×3): 20 mg via ORAL
  Filled 2017-05-27 (×3): qty 1

## 2017-05-27 MED ORDER — POTASSIUM CHLORIDE 20 MEQ PO PACK
40.0000 meq | PACK | Freq: Once | ORAL | Status: AC
  Administered 2017-05-27: 40 meq via ORAL
  Filled 2017-05-27: qty 2

## 2017-05-27 NOTE — Anesthesia Postprocedure Evaluation (Signed)
Anesthesia Post Note  Patient: Krystal Hensley  Procedure(s) Performed: ESOPHAGOGASTRODUODENOSCOPY (EGD) WITH PROPOFOL (N/A )     Anesthesia Post Evaluation  Last Vitals:  Vitals:   05/27/17 0042 05/27/17 0649  BP: (!) 109/57 127/65  Pulse: 90 92  Resp: 18 16  Temp: 36.7 C 36.5 C  SpO2: 91% 94%    Last Pain:  Vitals:   05/27/17 0649  TempSrc: Axillary  PainSc:    Pain Goal:                 Lynda Rainwater

## 2017-05-27 NOTE — Clinical Social Work Note (Signed)
Clinical Social Work Assessment  Patient Details  Name: Krystal Hensley MRN: [id number] Date of Birth: [birth year]  Date of referral:  05/27/17               Reason for consult:  Facility Placement                Permission sought to share information with:  Facility Sport and exercise psychologist, Family Supports Permission granted to share information::  Yes, Verbal Permission Granted  Name::     Personnel officer::  SNF  Relationship::  Daughter  Contact Information:     Housing/Transportation Living arrangements for the past 2 months:  [place] of Information:  Patient, Adult Children Patient Interpreter Needed:  None Criminal Activity/Legal Involvement Pertinent to Current Situation/Hospitalization:  No - Comment as needed Significant Relationships:  Adult Children Lives with:  Self Do you feel safe going back to the place where you live?  Yes Need for family participation in patient care:  Yes (Comment)(patient requested her daughter be contacted)  Care giving concerns:  Patient lives home alone, and is requesting rehabilitation at discharge in order to feel more comfortable on her own.   Social Worker assessment / plan:  CSW alerted that patient would like to go to rehab at discharge, and MD notes reference SNF at discharge. CSW to complete referral and fax out.  Employment status:  Retired Forensic scientist:  Medicare PT Recommendations:  Home with [place] / Referral to community resources:  [place]  Patient/Family's Response to care:  Patient agreeable for SNF placement.  Patient/Family's Understanding of and Emotional Response to Diagnosis, Current Treatment, and Prognosis:  Patient and patient's daughter are aware that the current recommendation is for home health, and the daughter has been looking into getting some additional aid support for the patient at home. Patient lives at home alone but has intermittent aid support;  not 24/7. Patient's daughter discussed that it was out of the norm for the patient to actually request SNF, so she may not be feeling very confident about going home alone.   Emotional Assessment Appearance:  Appears stated age Attitude/Demeanor/Rapport:  Apprehensive Affect (typically observed):  Apprehensive Orientation:  Oriented to Self, Oriented to Place, Oriented to  Time, Oriented to Situation Alcohol / Substance use:  Not Applicable Psych involvement (Current and /or in the community):  No (Comment)  Discharge Needs  Concerns to be addressed:  Care Coordination Readmission within the last 30 days:  No Current discharge risk:  Lives alone, Physical Impairment Barriers to Discharge:  Continued Medical Work up, [place], [place] 05/27/2017, 4:54 PM

## 2017-05-27 NOTE — Progress Notes (Signed)
  Echocardiogram 2D Echocardiogram has been performed.  Krystal Hensley 05/27/2017, 2:56 PM

## 2017-05-27 NOTE — Progress Notes (Signed)
Subjective: Patient was seen and examined at bedside. Has not had further episodes of black stools since endoscopy yesterday. Tolerating clear liquid diet. Denies abdominal pain.  Objective: Vital signs in last 24 hours: Temp:  [97.5 F (36.4 C)-98.3 F (36.8 C)] 97.5 F (36.4 C) (11/21 0907) Pulse Rate:  [68-100] 100 (11/21 0907) Resp:  [16-20] 20 (11/21 0907) BP: (95-127)/(38-67) 112/51 (11/21 0907) SpO2:  [91 %-97 %] 92 % (11/21 0907) Weight change:     PE: Appears his stated age, not in acute distress, pleasant and cooperative GENERAL: Mild pallor, no icterus ABDOMEN: Soft, non-distended, non-tender, sluggish bowel sounds EXTREMITIES: No obvious deformity  Lab Results: Results for orders placed or performed during the hospital encounter of 05/25/17 (from the past 48 hour(s))  CBC     Status: Abnormal   Collection Time: 05/25/17 10:20 PM  Result Value Ref Range   WBC 12.4 (H) 4.0 - 10.5 K/uL   RBC 2.71 (L) 3.87 - 5.11 MIL/uL   Hemoglobin 9.2 (L) 12.0 - 15.0 g/dL    Comment: REPEATED TO VERIFY POST TRANSFUSION SPECIMEN    HCT 27.2 (L) 36.0 - 46.0 %   MCV 100.4 (H) 78.0 - 100.0 fL   MCH 33.9 26.0 - 34.0 pg   MCHC 33.8 30.0 - 36.0 g/dL   RDW 18.1 (H) 11.5 - 15.5 %   Platelets 179 150 - 400 K/uL  Comprehensive metabolic panel     Status: Abnormal   Collection Time: 05/25/17 10:20 PM  Result Value Ref Range   Sodium 138 135 - 145 mmol/L   Potassium 4.3 3.5 - 5.1 mmol/L   Chloride 111 101 - 111 mmol/L   CO2 21 (L) 22 - 32 mmol/L   Glucose, Bld 120 (H) 65 - 99 mg/dL   BUN 29 (H) 6 - 20 mg/dL   Creatinine, Ser 0.64 0.44 - 1.00 mg/dL   Calcium 7.9 (L) 8.9 - 10.3 mg/dL   Total Protein 4.6 (L) 6.5 - 8.1 g/dL   Albumin 2.5 (L) 3.5 - 5.0 g/dL   AST 31 15 - 41 U/L   ALT 19 14 - 54 U/L   Alkaline Phosphatase 70 38 - 126 U/L   Total Bilirubin 3.0 (H) 0.3 - 1.2 mg/dL   GFR calc non Af Amer >60 >60 mL/min   GFR calc Af Amer >60 >60 mL/min    Comment: (NOTE) The eGFR has  been calculated using the CKD EPI equation. This calculation has not been validated in all clinical situations. eGFR's persistently <60 mL/min signify possible Chronic Kidney Disease.    Anion gap 6 5 - 15  Hemoglobin A1c     Status: None   Collection Time: 05/26/17  4:42 AM  Result Value Ref Range   Hgb A1c MFr Bld 5.4 4.8 - 5.6 %    Comment: (NOTE) Pre diabetes:          5.7%-6.4% Diabetes:              >6.4% Glycemic control for   <7.0% adults with diabetes    Mean Plasma Glucose 108.28 mg/dL  Lipid panel     Status: Abnormal   Collection Time: 05/26/17  4:42 AM  Result Value Ref Range   Cholesterol 116 0 - 200 mg/dL   Triglycerides 160 (H) <150 mg/dL   HDL 27 (L) >40 mg/dL   Total CHOL/HDL Ratio 4.3 RATIO   VLDL 32 0 - 40 mg/dL   LDL Cholesterol 57 0 - 99 mg/dL  Comment:        Total Cholesterol/HDL:CHD Risk Coronary Heart Disease Risk Table                     Men   Women  1/2 Average Risk   3.4   3.3  Average Risk       5.0   4.4  2 X Average Risk   9.6   7.1  3 X Average Risk  23.4   11.0        Use the calculated Patient Ratio above and the CHD Risk Table to determine the patient's CHD Risk.        ATP III CLASSIFICATION (LDL):  <100     mg/dL   Optimal  100-129  mg/dL   Near or Above                    Optimal  130-159  mg/dL   Borderline  160-189  mg/dL   High  >190     mg/dL   Very High   CBC     Status: Abnormal   Collection Time: 05/26/17  4:42 AM  Result Value Ref Range   WBC 11.9 (H) 4.0 - 10.5 K/uL   RBC 2.88 (L) 3.87 - 5.11 MIL/uL   Hemoglobin 8.9 (L) 12.0 - 15.0 g/dL   HCT 27.9 (L) 36.0 - 46.0 %   MCV 96.9 78.0 - 100.0 fL   MCH 30.9 26.0 - 34.0 pg   MCHC 31.9 30.0 - 36.0 g/dL   RDW 17.6 (H) 11.5 - 15.5 %   Platelets 182 150 - 400 K/uL  Basic metabolic panel     Status: Abnormal   Collection Time: 05/26/17  4:42 AM  Result Value Ref Range   Sodium 139 135 - 145 mmol/L   Potassium 3.9 3.5 - 5.1 mmol/L   Chloride 111 101 - 111 mmol/L    CO2 20 (L) 22 - 32 mmol/L   Glucose, Bld 101 (H) 65 - 99 mg/dL   BUN 27 (H) 6 - 20 mg/dL   Creatinine, Ser 0.61 0.44 - 1.00 mg/dL   Calcium 7.9 (L) 8.9 - 10.3 mg/dL   GFR calc non Af Amer >60 >60 mL/min   GFR calc Af Amer >60 >60 mL/min    Comment: (NOTE) The eGFR has been calculated using the CKD EPI equation. This calculation has not been validated in all clinical situations. eGFR's persistently <60 mL/min signify possible Chronic Kidney Disease.    Anion gap 8 5 - 15  CBC     Status: Abnormal   Collection Time: 05/27/17  3:09 AM  Result Value Ref Range   WBC 9.7 4.0 - 10.5 K/uL   RBC 2.68 (L) 3.87 - 5.11 MIL/uL   Hemoglobin 8.6 (L) 12.0 - 15.0 g/dL   HCT 27.1 (L) 36.0 - 46.0 %   MCV 101.1 (H) 78.0 - 100.0 fL   MCH 32.1 26.0 - 34.0 pg   MCHC 31.7 30.0 - 36.0 g/dL   RDW 17.9 (H) 11.5 - 15.5 %   Platelets 175 150 - 400 K/uL  Basic metabolic panel     Status: Abnormal   Collection Time: 05/27/17  3:09 AM  Result Value Ref Range   Sodium 140 135 - 145 mmol/L   Potassium 3.5 3.5 - 5.1 mmol/L   Chloride 112 (H) 101 - 111 mmol/L   CO2 22 22 - 32 mmol/L   Glucose, Bld 83 65 - 99  mg/dL   BUN 21 (H) 6 - 20 mg/dL   Creatinine, Ser 0.60 0.44 - 1.00 mg/dL   Calcium 7.8 (L) 8.9 - 10.3 mg/dL   GFR calc non Af Amer >60 >60 mL/min   GFR calc Af Amer >60 >60 mL/min    Comment: (NOTE) The eGFR has been calculated using the CKD EPI equation. This calculation has not been validated in all clinical situations. eGFR's persistently <60 mL/min signify possible Chronic Kidney Disease.    Anion gap 6 5 - 15    Studies/Results: Mr Brain Wo Contrast  Result Date: 05/26/2017 CLINICAL DATA:  Initial evaluation for acute speech difficulty. EXAM: MRI HEAD WITHOUT CONTRAST MRA HEAD WITHOUT CONTRAST TECHNIQUE: Multiplanar, multiecho pulse sequences of the brain and surrounding structures were obtained without intravenous contrast. Angiographic images of the head were obtained using MRA technique  without contrast. COMPARISON:  Prior CT from 05/25/2017. FINDINGS: MRI HEAD FINDINGS Brain: Study moderately degraded by motion artifact. Diffuse prominence of the CSF containing spaces compatible with generalized cerebral atrophy. Patchy and confluent T2/FLAIR hyperintensity within the periventricular and deep white matter both cerebral hemispheres most compatible chronic small vessel ischemic disease, fairly advanced in nature. Few scattered remote bilateral cerebellar infarcts. Remote lacunar infarct present within the left thalamus. Patchy small volume acute ischemic infarcts present within the left cerebellar hemisphere (series 4, image 9). Largest area of infarction measures 6 mm. No associated hemorrhage or mass effect. No other evidence for acute or subacute infarct. Gray-white matter differentiation otherwise maintained. No other evidence for acute or chronic intracranial hemorrhage. No other areas of chronic infarction. No mass lesion, midline shift or mass effect. No hydrocephalus. No extra-axial fluid collection. Major dural sinuses grossly patent. Pituitary gland normal.  Midline structures intact and normal. Vascular: Major intracranial vascular flow voids maintained. Skull and upper cervical spine: Degenerative thickening at the tectorial membrane with mild narrowing at the craniocervical junction. Visualized upper cervical spine grossly unremarkable. Bone marrow signal intensity within normal limits. No scalp soft tissue abnormality. Sinuses/Orbits: Globes and orbital soft tissues within normal limits. Patient status post cataract extraction bilaterally. Paranasal sinuses clear. No mastoid effusion. Inner ear structures grossly normal. Other: None. MRA HEAD FINDINGS ANTERIOR CIRCULATION: Study degraded by motion artifact. Distal cervical segments of the internal carotid arteries are patent with antegrade flow. Petrous, cavernous and supraclinoid segments patent without flow-limiting stenosis. A1  segments widely patent. Normal anterior communicating artery. Anterior cerebral arteries patent to their distal aspects without stenosis. M1 segments demonstrate probable atheromatous irregularity without high-grade flow-limiting stenosis. Normal MCA bifurcations. No proximal M2 occlusion. Distal MCA branches well perfused and symmetric. POSTERIOR CIRCULATION: Vertebral arteries patent to the vertebrobasilar junction. Left vertebral artery dominant. Posterior inferior cerebral arteries patent proximally. Basilar artery patent to its distal aspect without stenosis. Superior cerebral arteries patent proximally. Right PCA supplied via the basilar as well as a prominent right posterior communicating artery. Fetal type origin of the left PCA. PCAs patent to their distal aspects without stenosis. No intracranial aneurysm. IMPRESSION: MRI HEAD IMPRESSION: 1. Patchy small volume acute ischemic nonhemorrhagic left cerebellar infarcts. 2. Remote lacunar infarcts involving the left thalamus and bilateral cerebellar hemispheres. 3. Age-related atrophy with advanced chronic microvascular ischemic disease. MRA HEAD IMPRESSION: Negative intracranial MRA. No large vessel occlusion. No high-grade or correctable stenosis. Electronically Signed   By: Jeannine Boga M.D.   On: 05/26/2017 01:23   Mr Jodene Nam Head Wo Contrast  Result Date: 05/26/2017 CLINICAL DATA:  Initial evaluation for acute speech difficulty. EXAM:  MRI HEAD WITHOUT CONTRAST MRA HEAD WITHOUT CONTRAST TECHNIQUE: Multiplanar, multiecho pulse sequences of the brain and surrounding structures were obtained without intravenous contrast. Angiographic images of the head were obtained using MRA technique without contrast. COMPARISON:  Prior CT from 05/25/2017. FINDINGS: MRI HEAD FINDINGS Brain: Study moderately degraded by motion artifact. Diffuse prominence of the CSF containing spaces compatible with generalized cerebral atrophy. Patchy and confluent T2/FLAIR  hyperintensity within the periventricular and deep white matter both cerebral hemispheres most compatible chronic small vessel ischemic disease, fairly advanced in nature. Few scattered remote bilateral cerebellar infarcts. Remote lacunar infarct present within the left thalamus. Patchy small volume acute ischemic infarcts present within the left cerebellar hemisphere (series 4, image 9). Largest area of infarction measures 6 mm. No associated hemorrhage or mass effect. No other evidence for acute or subacute infarct. Gray-white matter differentiation otherwise maintained. No other evidence for acute or chronic intracranial hemorrhage. No other areas of chronic infarction. No mass lesion, midline shift or mass effect. No hydrocephalus. No extra-axial fluid collection. Major dural sinuses grossly patent. Pituitary gland normal.  Midline structures intact and normal. Vascular: Major intracranial vascular flow voids maintained. Skull and upper cervical spine: Degenerative thickening at the tectorial membrane with mild narrowing at the craniocervical junction. Visualized upper cervical spine grossly unremarkable. Bone marrow signal intensity within normal limits. No scalp soft tissue abnormality. Sinuses/Orbits: Globes and orbital soft tissues within normal limits. Patient status post cataract extraction bilaterally. Paranasal sinuses clear. No mastoid effusion. Inner ear structures grossly normal. Other: None. MRA HEAD FINDINGS ANTERIOR CIRCULATION: Study degraded by motion artifact. Distal cervical segments of the internal carotid arteries are patent with antegrade flow. Petrous, cavernous and supraclinoid segments patent without flow-limiting stenosis. A1 segments widely patent. Normal anterior communicating artery. Anterior cerebral arteries patent to their distal aspects without stenosis. M1 segments demonstrate probable atheromatous irregularity without high-grade flow-limiting stenosis. Normal MCA bifurcations.  No proximal M2 occlusion. Distal MCA branches well perfused and symmetric. POSTERIOR CIRCULATION: Vertebral arteries patent to the vertebrobasilar junction. Left vertebral artery dominant. Posterior inferior cerebral arteries patent proximally. Basilar artery patent to its distal aspect without stenosis. Superior cerebral arteries patent proximally. Right PCA supplied via the basilar as well as a prominent right posterior communicating artery. Fetal type origin of the left PCA. PCAs patent to their distal aspects without stenosis. No intracranial aneurysm. IMPRESSION: MRI HEAD IMPRESSION: 1. Patchy small volume acute ischemic nonhemorrhagic left cerebellar infarcts. 2. Remote lacunar infarcts involving the left thalamus and bilateral cerebellar hemispheres. 3. Age-related atrophy with advanced chronic microvascular ischemic disease. MRA HEAD IMPRESSION: Negative intracranial MRA. No large vessel occlusion. No high-grade or correctable stenosis. Electronically Signed   By: Jeannine Boga M.D.   On: 05/26/2017 01:23    Medications: I have reviewed the patient's current medications.  Assessment: 1. Melena from 2 cratered duodenal bulb/sweep ulcers, treated with epinephrine and Endo Clip placement. 2. Hemoglobin remained stable, BUN slightly decreased from 27-21, hemodynamically stable 3. Acute ischemic nonhemorrhagic left cerebellar infarcts noted on MRI from 05/26/17, remote lacunar infarcts off the left thalamus and bilateral cerebellar hemispheres  Plan: 1. Continue Protonix drip for another 24 hours, switch to Protonix twice a day from tomorrow 2. Advance diet to regular. 3. If able to tolerate regular diet with no further evidence of bleeding, okay to discharge patient in a.m. on PPI twice a day indefinitely, as patient needs to be on aspirin and prednisone. 4. Follow-up H. pylori serology, treat as an outpatient if needed.   Megan Salon  Therisa Doyne 05/27/2017, 9:17 AM   Pager [phone] If no  answer or after 5 PM call [phone]

## 2017-05-27 NOTE — NC FL2 (Signed)
[place] LEVEL OF CARE SCREENING TOOL     IDENTIFICATION  Patient Name: Krystal Hensley Birthdate: 1918-06-11 Sex: female Admission Date (Current Location): 05/25/2017  [facility] and [place] Number:  Herbalist and Address:  [place]. [facility], [place] [address], [place], [place] [address]      Provider Number: [id number]  Attending Physician Name and Address:  Tawni Millers  Relative Name and Phone Number:       Current Level of Care: Hospital Recommended Level of Care: [place] Prior Approval Number:    Date Approved/Denied:   PASRR Number: [id number]  Discharge Plan: SNF    Current Diagnoses: Patient Active Problem List   Diagnosis Date Noted  . TIA (transient ischemic attack) 05/25/2017  . History of CVA (cerebrovascular accident) 05/25/2017  . Symptomatic anemia 05/25/2017  . Upper GI bleed 05/25/2017  . Right knee injury, subsequent encounter 01/14/2017  . Tachycardia 01/14/2017  . Shortness of breath 11/09/2016  . Hyponatremia 11/22/2015  . Acute on chronic diastolic CHF (congestive heart failure), NYHA class 2 ([place]) 02/15/2015  . Dyspnea 07/24/2014  . Abdominal pain, right upper quadrant 07/24/2014  . Chronic diastolic CHF (congestive heart failure) ([place]) 07/24/2014  . Atrial fibrillation with RVR ([place]) 06/14/2014  . Constipation 04/03/2014  . Pulmonary hypertension ([place]) 02/02/2014  . Chest pain 01/14/2014  . Cerebral infarction ([place]) 08/08/2013  . RUE weakness 08/08/2013  . HTN (hypertension) 08/08/2013  . PMR (polymyalgia rheumatica) (HCC) 08/08/2013  . Spastic hemiplegia affecting dominant side ([place]) 08/08/2013  . Wound of left leg 08/08/2013  . Hx of PAF- CHADS VASC=7 08/08/2013    Orientation RESPIRATION BLADDER Height & Weight     Self, Time, Situation, Place  Normal Incontinent Weight: 109 lb 12.6 oz (49.8 kg) Height:  5\' 1"  (154.9 cm)  BEHAVIORAL SYMPTOMS/MOOD  NEUROLOGICAL BOWEL NUTRITION STATUS      Continent    AMBULATORY STATUS COMMUNICATION OF NEEDS Skin   Limited Assist Verbally Normal                       Personal Care Assistance Level of Assistance  Bathing, Feeding, Dressing Bathing Assistance: Limited assistance Feeding assistance: Limited assistance Dressing Assistance: Limited assistance     Functional Limitations Info  Sight, Hearing, Speech Sight Info: Adequate Hearing Info: Impaired Speech Info: Adequate    SPECIAL CARE FACTORS FREQUENCY  PT (By licensed PT), OT (By licensed OT)     PT Frequency: 5x/wk OT Frequency: 5x/wk            Contractures Contractures Info: Not present    Additional Factors Info  Code Status, Allergies, Psychotropic Code Status Info: DNR Allergies Info: Chocolate, Statins Psychotropic Info: Buspar 5mg  daily         Current Medications (05/27/2017):  This is the current hospital active medication list Current Facility-Administered Medications  Medication Dose Route Frequency Provider Last Rate Last Dose  . acetaminophen (TYLENOL) tablet 1,000 mg  1,000 mg Oral Q6H PRN Waldemar Dickens, MD       Or  . acetaminophen (TYLENOL) suppository 650 mg  650 mg Rectal Q6H PRN Waldemar Dickens, MD      . busPIRone (BUSPAR) tablet 5 mg  5 mg Oral Daily Waldemar Dickens, MD   5 mg at 05/27/17 0910  . diltiazem (CARDIZEM CD) 24 hr capsule 180 mg  180 mg Oral Daily Waldemar Dickens, MD   180 mg at  05/27/17 0909  . furosemide (LASIX) tablet 20 mg  20 mg Oral Daily Arrien, Jimmy Picket, MD   20 mg at 05/27/17 1647  . metoprolol succinate (TOPROL-XL) 24 hr tablet 50 mg  50 mg Oral Daily Waldemar Dickens, MD   50 mg at 05/27/17 9024  . ondansetron (ZOFRAN) tablet 4 mg  4 mg Oral Q6H PRN Waldemar Dickens, MD       Or  . ondansetron [facility]) injection 4 mg  4 mg Intravenous Q6H PRN Waldemar Dickens, MD      . pantoprazole (PROTONIX) 80 mg in sodium chloride 0.9 % 250 mL (0.32 mg/mL) infusion  8  mg/hr Intravenous Continuous Kathrynn Humble, Ankit, MD 25 mL/hr at 05/27/17 1020 8 mg/hr at 05/27/17 1020  . [START ON 05/28/2017] pantoprazole (PROTONIX) injection 40 mg  40 mg Intravenous Q12H Nanavati, Ankit, MD      . polyethylene glycol (MIRALAX / GLYCOLAX) packet 17 g  17 g Oral Daily [name], MD   17 g at 05/27/17 0910  . predniSONE (DELTASONE) tablet 5 mg  5 mg Oral Q breakfast Waldemar Dickens, MD   5 mg at 05/27/17 0973  . senna-docusate (Senokot-S) tablet 1 tablet  1 tablet Oral Daily PRN [name], MD         Discharge Medications: Please see discharge summary for a list of discharge medications.  Relevant Imaging Results:  Relevant Lab Results:   Additional Information SS#: [id number]  Geralynn Ochs, LCSW

## 2017-05-27 NOTE — Progress Notes (Signed)
PROGRESS NOTE    Krystal Hensley  GEZ:[id number] DOB: 1917-10-23 DOA: 05/25/2017 PCP: Lajean Manes, MD    Brief Narrative:  83 year old female who presented with difficulty speaking. She does have significant past medical history for colon cancer status post partial colectomy, hypertension, paroxysmal atrial fibrillation, and history of CVA. Patient reported today history of intermittent dark stools, without chest pain or dyspnea. On the day of admission she was noted to have difficulty speaking. On her initial physical examination blood pressure 129/91, heart rate 65, respiratory 22, oxygen saturation 98%. She had dry mucous membranes, lungs were clear to auscultation bilaterally, heart S1-S2 present, regular, abdomen soft nontender, no lower extremity edema, neurologically patient was intact. Sodium 141, potassium 3.9, chloride 111, bicarbonate 21, glucose 153, BUN 33, creatinine 0.73, white count 12.5, hemoglobin 6.8, hematocrit 21.7, platelets 216, urinalysis negative for infection, positive fecal occult blood, head CT with no acute changes, chest film with left rotation, possibly cardiomegaly, no infiltrates. EKG, atrial fibrillation, rate 116 bpm, left axis deviation, left anterior fascicular block, poor R-wave progression, lateral ST depressions V5 and V6.   Patient was admitted to the hospital working diagnosis of symptomatic anemia due to lower GI bleed complicated by expressive aphasia, atrial fibrillation with rapid ventricular response.  Assessment & Plan:   Active Problems:   PMR (polymyalgia rheumatica) (HCC)   Hx of PAF- CHADS VASC=7   Atrial fibrillation with RVR (HCC)   Chronic diastolic CHF (congestive heart failure) (HCC)   TIA (transient ischemic attack)   History of CVA (cerebrovascular accident)   Symptomatic anemia   Upper GI bleed   1. Symptomatic anemia due to duodenal ulcers. Hb and Hct stable after 2 units of prbc transfusion, will continue IV pantoprazole for  duodenal ulcers, for 24 hours more, then change to po pantoprazole. Will hold on asa for now.   2. Transitory ischemic attack. Will plan to resume asa at discharge, will continue neuro checks and physical therapy evaluation, patient feeling very weak and interested in snf at discharge.   3. Atrial fibrillation with rapid ventricular response. Rate controlled with diltiazem and metoprolol, will continue to hold on anticoagulation until discharge. No DOAC for now due to risk of recurrent GI bleeding.   4. Chronic diastolic heart failure. Clinically euvolemic, will continue blood pressure monitoring, will resume home dose of furosemide to keep negative fluid balance.   DVT prophylaxis: scd  Code Status: dnr Family Communication:  Disposition Plan:    Consultants:   Neurology   Gastroenterology   Procedures:   Upper endscopy.   Antimicrobials:      Subjective: Patient feeling better, tolerating po well, feeling very weak and deconditioned, no chest pain, abdominal pain or dyspnea. No nausea or vomiting. Speech is back to normal.   Objective: Vitals:   05/26/17 2157 05/27/17 0042 05/27/17 0649 05/27/17 0907  BP: 107/64 (!) 109/57 127/65 (!) 112/51  Pulse: 68 90 92 100  Resp: 20 18 16 20   Temp: 98.3 F (36.8 C) 98.1 F (36.7 C) 97.7 F (36.5 C) (!) 97.5 F (36.4 C)  TempSrc: Axillary Axillary Axillary Oral  SpO2: 93% 91% 94% 92%  Weight:      Height:        Intake/Output Summary (Last 24 hours) at 05/27/2017 1153 Last data filed at 05/27/2017 0703 Gross per 24 hour  Intake 1000 ml  Output 300 ml  Net 700 ml   Filed Weights   05/25/17 0934 05/25/17 2300  Weight: 49.9 kg (110 lb)  49.8 kg (109 lb 12.6 oz)    Examination:   General: Not in pain or dyspnea, deconditioned Neurology: Awake and alert, non focal  E ENT: positive pallor, no icterus, oral mucosa moist Cardiovascular: No JVD. S1-S2 present, rhythmic, no gallops, rubs, or murmurs. No lower extremity  edema. Pulmonary: vesicular breath sounds bilaterally, adequate air movement, no wheezing, rhonchi or rales. Gastrointestinal. Abdomen with mild distention, no organomegaly, non tender, no rebound or guarding Skin. No rashes Musculoskeletal: no joint deformities     Data Reviewed: I have personally reviewed following labs and imaging studies  CBC: Recent Labs  Lab 05/25/17 0720 05/25/17 2220 05/26/17 0442 05/27/17 0309  WBC 11.5*  12.5* 12.4* 11.9* 9.7  NEUTROABS 9.4*  9.6*  --   --   --   HGB 6.6*  6.8* 9.2* 8.9* 8.6*  HCT 20.8*  21.7* 27.2* 27.9* 27.1*  MCV 104.0*  103.8* 100.4* 96.9 101.1*  PLT 201  216 179 182 875   Basic Metabolic Panel: Recent Labs  Lab 05/25/17 0720 05/25/17 2220 05/26/17 0442 05/27/17 0309  NA 141 138 139 140  K 3.9 4.3 3.9 3.5  CL 111 111 111 112*  CO2 21* 21* 20* 22  GLUCOSE 153* 120* 101* 83  BUN 33* 29* 27* 21*  CREATININE 0.73 0.64 0.61 0.60  CALCIUM 8.1* 7.9* 7.9* 7.8*  MG 2.3  --   --   --   PHOS 3.0  --   --   --    GFR: Estimated Creatinine Clearance: 30.3 mL/min (by C-G formula based on SCr of 0.6 mg/dL). Liver Function Tests: Recent Labs  Lab 05/25/17 0720 05/25/17 2220  AST 30 31  ALT 19 19  ALKPHOS 73 70  BILITOT 0.5 3.0*  PROT 4.7* 4.6*  ALBUMIN 2.5* 2.5*   No results for input(s): LIPASE, AMYLASE in the last 168 hours. No results for input(s): AMMONIA in the last 168 hours. Coagulation Profile: Recent Labs  Lab 05/25/17 0720  INR 1.18   Cardiac Enzymes: No results for input(s): CKTOTAL, CKMB, CKMBINDEX, TROPONINI in the last 168 hours. BNP (last 3 results) Recent Labs    04/27/17 1347  PROBNP 3,438*   HbA1C: Recent Labs    05/26/17 0442  HGBA1C 5.4   CBG: Recent Labs  Lab 05/25/17 0714  GLUCAP 150*   Lipid Profile: Recent Labs    05/26/17 0442  CHOL 116  HDL 27*  LDLCALC 57  TRIG 160*  CHOLHDL 4.3   Thyroid Function Tests: No results for input(s): TSH, T4TOTAL, FREET4, T3FREE,  THYROIDAB in the last 72 hours. Anemia Panel: No results for input(s): VITAMINB12, FOLATE, FERRITIN, TIBC, IRON, RETICCTPCT in the last 72 hours.    Radiology Studies: I have reviewed all of the imaging during this hospital visit personally     Scheduled Meds: . aspirin EC  325 mg Oral Daily   Or  . aspirin  300 mg Rectal Daily  . busPIRone  5 mg Oral Daily  . diltiazem  180 mg Oral Daily  . furosemide  20 mg Oral Daily  . metoprolol succinate  50 mg Oral Daily  . [START ON 05/28/2017] pantoprazole  40 mg Intravenous Q12H  . polyethylene glycol  17 g Oral Daily  . predniSONE  5 mg Oral Q breakfast   Continuous Infusions: . sodium chloride 50 mL/hr at 05/27/17 1023  . pantoprozole (PROTONIX) infusion 8 mg/hr (05/27/17 1020)     LOS: 2 days  Mauricio Gerome Apley, MD [facility] Pager [phone]

## 2017-05-27 NOTE — Progress Notes (Signed)
CM met with the patient and spoke to the daughter over the phone about d/c plans. Patient interested in rehab prior to d/cing home. Daughter in agreement. CSW updated. CM following.

## 2017-05-28 DIAGNOSIS — Z8673 Personal history of transient ischemic attack (TIA), and cerebral infarction without residual deficits: Secondary | ICD-10-CM

## 2017-05-28 LAB — CBC WITH DIFFERENTIAL/PLATELET
BASOS PCT: 0 %
Basophils Absolute: 0 10*3/uL (ref 0.0–0.1)
EOS ABS: 0.2 10*3/uL (ref 0.0–0.7)
Eosinophils Relative: 2 %
HCT: 26.2 % — ABNORMAL LOW (ref 36.0–46.0)
HEMOGLOBIN: 8.3 g/dL — AB (ref 12.0–15.0)
LYMPHS ABS: 1.2 10*3/uL (ref 0.7–4.0)
Lymphocytes Relative: 12 %
MCH: 32.2 pg (ref 26.0–34.0)
MCHC: 31.7 g/dL (ref 30.0–36.0)
MCV: 101.6 fL — ABNORMAL HIGH (ref 78.0–100.0)
MONO ABS: 0.9 10*3/uL (ref 0.1–1.0)
MONOS PCT: 10 %
NEUTROS ABS: 7.6 10*3/uL (ref 1.7–7.7)
NEUTROS PCT: 76 %
Platelets: 158 10*3/uL (ref 150–400)
RBC: 2.58 MIL/uL — ABNORMAL LOW (ref 3.87–5.11)
RDW: 17.9 % — AB (ref 11.5–15.5)
WBC: 9.8 10*3/uL (ref 4.0–10.5)

## 2017-05-28 LAB — BASIC METABOLIC PANEL WITH GFR
Anion gap: 5 (ref 5–15)
BUN: 16 mg/dL (ref 6–20)
CO2: 24 mmol/L (ref 22–32)
Calcium: 7.6 mg/dL — ABNORMAL LOW (ref 8.9–10.3)
Chloride: 109 mmol/L (ref 101–111)
Creatinine, Ser: 0.65 mg/dL (ref 0.44–1.00)
GFR calc Af Amer: >60 mL/min
GFR calc non Af Amer: >60 mL/min
Glucose, Bld: 93 mg/dL (ref 65–99)
Potassium: 3.4 mmol/L — ABNORMAL LOW (ref 3.5–5.1)
Sodium: 138 mmol/L (ref 135–145)

## 2017-05-28 LAB — TYPE AND SCREEN
ABO/RH(D): O NEG
Antibody Screen: NEGATIVE
UNIT DIVISION: 0
Unit division: 0
Unit division: 0

## 2017-05-28 LAB — BPAM RBC
BLOOD PRODUCT EXPIRATION DATE: 201811282359
Blood Product Expiration Date: 201811232359
Blood Product Expiration Date: 201811252359
ISSUE DATE / TIME: 201811141133
ISSUE DATE / TIME: 201811191044
ISSUE DATE / TIME: 201811191514
UNIT TYPE AND RH: 9500
UNIT TYPE AND RH: 9500
Unit Type and Rh: 9500

## 2017-05-28 MED ORDER — BISACODYL 5 MG PO TBEC
10.0000 mg | DELAYED_RELEASE_TABLET | Freq: Once | ORAL | Status: AC
  Administered 2017-05-28: 10 mg via ORAL
  Filled 2017-05-28: qty 2

## 2017-05-28 MED ORDER — POTASSIUM CHLORIDE CRYS ER 20 MEQ PO TBCR
40.0000 meq | EXTENDED_RELEASE_TABLET | Freq: Once | ORAL | Status: AC
  Administered 2017-05-28: 40 meq via ORAL
  Filled 2017-05-28: qty 2

## 2017-05-28 MED ORDER — PANTOPRAZOLE SODIUM 40 MG PO TBEC
40.0000 mg | DELAYED_RELEASE_TABLET | Freq: Two times a day (BID) | ORAL | Status: DC
  Administered 2017-05-28 – 2017-05-29 (×3): 40 mg via ORAL
  Filled 2017-05-28 (×3): qty 1

## 2017-05-28 MED ORDER — POLYETHYLENE GLYCOL 3350 17 G PO PACK
17.0000 g | PACK | Freq: Two times a day (BID) | ORAL | Status: DC
  Administered 2017-05-28 – 2017-05-29 (×2): 17 g via ORAL
  Filled 2017-05-28 (×2): qty 1

## 2017-05-28 MED ORDER — ENSURE ENLIVE PO LIQD: 237.0000 mL | Freq: Two times a day (BID) | ORAL | Status: DC

## 2017-05-28 NOTE — Progress Notes (Signed)
PROGRESS NOTE    Krystal Hensley  MGQ:[id number] DOB: 08-26-1917 DOA: 05/25/2017 PCP: Lajean Manes, MD    Brief Narrative:  83 year old female who presented with difficulty speaking. She does have significant past medical history for colon cancer status post partial colectomy, hypertension, paroxysmal atrial fibrillation, and history of CVA. Patient reported today history of intermittent dark stools, without chest pain or dyspnea. On the day of admission she was noted to have difficulty speaking. On her initial physical examination blood pressure 129/91, heart rate 65, respiratory 22, oxygen saturation 98%. She had dry mucous membranes, lungs were clear to auscultation bilaterally, heart S1-S2 present, regular, abdomen soft nontender, no lower extremity edema, neurologically patient was intact. Sodium 141, potassium 3.9, chloride 111, bicarbonate 21, glucose 153, BUN 33, creatinine 0.73, white count 12.5, hemoglobin 6.8, hematocrit 21.7, platelets 216, urinalysis negative for infection, positive fecal occult blood, head CT with no acute changes, chest film with left rotation, possibly cardiomegaly, no infiltrates. EKG, atrial fibrillation, rate 116 bpm, left axis deviation, left anterior fascicular block, poor R-wave progression, lateral ST depressions V5 and V6.   Patient was admitted to the hospital working diagnosis of symptomatic anemia due to lower GI bleed complicated by expressive aphasia, atrial fibrillation with rapid ventricular response   Assessment & Plan:   Active Problems:   PMR (polymyalgia rheumatica) (HCC)   Hx of PAF- CHADS VASC=7   Atrial fibrillation with RVR (HCC)   Chronic diastolic CHF (congestive heart failure) (HCC)   TIA (transient ischemic attack)   History of CVA (cerebrovascular accident)   Symptomatic anemia   Upper GI bleed  1. Symptomatic anemia due to duodenal ulcers. Patient tolerating po well, no further signs of bleeding, will change pantoprazole to  po BID and continue close monitoring, will continue to hold on asa for now.  2. Transitory ischemic attack. Neurologically improved, but still very weak and deconditioned, will continue physical therapy evaluation, out of bed as tolerated, will resume asa at discharge. Plan for SNF.  3. Atrial fibrillation with rapid ventricular response. Continue diltiazem and metoprolol, for rate control. Due to risk of bleeding will hold on DOAC, but will use aspirin at discharge.    4. Chronic diastolic heart failure. Blood pressure monitoring, and diuretic therapy with furosemide, will hold on IV fluids. Continue k repletion with kcl.   5. Constipation. Continue bowel regimen.   6. Depression. Continue buspirone.   DVT prophylaxis: scd  Code Status: dnr Family Communication:  Disposition Plan:    Consultants:   Neurology   Gastroenterology   Procedures:   Upper endscopy.   Antimicrobials:     Subjective: Patient with generalized malaise, but no frank chest pain, dyspnea, nausea or vomiting. Positive constipation.   Objective: Vitals:   05/28/17 0008 05/28/17 0620 05/28/17 0921 05/28/17 0936  BP: 104/60 (!) 103/53 (!) 113/58 (!) 100/57  Pulse: 79 92 84 95  Resp: 20 16  18   Temp: 98.7 F (37.1 C) 97.9 F (36.6 C)  98.2 F (36.8 C)  TempSrc: Axillary Oral  Oral  SpO2: 94% 92%  95%  Weight:      Height:        Intake/Output Summary (Last 24 hours) at 05/28/2017 1128 Last data filed at 05/28/2017 0900 Gross per 24 hour  Intake 240 ml  Output -  Net 240 ml   Filed Weights   05/25/17 0934 05/25/17 2300  Weight: 49.9 kg (110 lb) 49.8 kg (109 lb 12.6 oz)    Examination:  General: Not in pain or dyspnea, deconditioned.  Neurology: Awake and alert, non focal  E ENT: mild pallor, no icterus, oral mucosa moist Cardiovascular: No JVD. S1-S2 present, rhythmic, no gallops, rubs, or murmurs. No lower extremity edema. Pulmonary: vesicular breath sounds bilaterally,  adequate air movement, no wheezing, rhonchi or rales. Gastrointestinal. Abdomen flat, no organomegaly, non tender, no rebound or guarding Skin. No rashes Musculoskeletal: no joint deformities     Data Reviewed: I have personally reviewed following labs and imaging studies  CBC: Recent Labs  Lab 05/25/17 0720 05/25/17 2220 05/26/17 0442 05/27/17 0309 05/28/17 0153  WBC 11.5*  12.5* 12.4* 11.9* 9.7 9.8  NEUTROABS 9.4*  9.6*  --   --   --  7.6  HGB 6.6*  6.8* 9.2* 8.9* 8.6* 8.3*  HCT 20.8*  21.7* 27.2* 27.9* 27.1* 26.2*  MCV 104.0*  103.8* 100.4* 96.9 101.1* 101.6*  PLT 201  216 179 182 175 673   Basic Metabolic Panel: Recent Labs  Lab 05/25/17 0720 05/25/17 2220 05/26/17 0442 05/27/17 0309 05/28/17 0153  NA 141 138 139 140 138  K 3.9 4.3 3.9 3.5 3.4*  CL 111 111 111 112* 109  CO2 21* 21* 20* 22 24  GLUCOSE 153* 120* 101* 83 93  BUN 33* 29* 27* 21* 16  CREATININE 0.73 0.64 0.61 0.60 0.65  CALCIUM 8.1* 7.9* 7.9* 7.8* 7.6*  MG 2.3  --   --   --   --   PHOS 3.0  --   --   --   --    GFR: Estimated Creatinine Clearance: 30.3 mL/min (by C-G formula based on SCr of 0.65 mg/dL). Liver Function Tests: Recent Labs  Lab 05/25/17 0720 05/25/17 2220  AST 30 31  ALT 19 19  ALKPHOS 73 70  BILITOT 0.5 3.0*  PROT 4.7* 4.6*  ALBUMIN 2.5* 2.5*   No results for input(s): LIPASE, AMYLASE in the last 168 hours. No results for input(s): AMMONIA in the last 168 hours. Coagulation Profile: Recent Labs  Lab 05/25/17 0720  INR 1.18   Cardiac Enzymes: No results for input(s): CKTOTAL, CKMB, CKMBINDEX, TROPONINI in the last 168 hours. BNP (last 3 results) Recent Labs    04/27/17 1347  PROBNP 3,438*   HbA1C: Recent Labs    05/26/17 0442  HGBA1C 5.4   CBG: Recent Labs  Lab 05/25/17 0714  GLUCAP 150*   Lipid Profile: Recent Labs    05/26/17 0442  CHOL 116  HDL 27*  LDLCALC 57  TRIG 160*  CHOLHDL 4.3   Thyroid Function Tests: No results for  input(s): TSH, T4TOTAL, FREET4, T3FREE, THYROIDAB in the last 72 hours. Anemia Panel: No results for input(s): VITAMINB12, FOLATE, FERRITIN, TIBC, IRON, RETICCTPCT in the last 72 hours.    Radiology Studies: I have reviewed all of the imaging during this hospital visit personally     Scheduled Meds: . busPIRone  5 mg Oral Daily  . diltiazem  180 mg Oral Daily  . furosemide  20 mg Oral Daily  . metoprolol succinate  50 mg Oral Daily  . pantoprazole  40 mg Intravenous Q12H  . polyethylene glycol  17 g Oral Daily  . predniSONE  5 mg Oral Q breakfast   Continuous Infusions:   LOS: 3 days        Krystal Hensley Gerome Apley, MD [facility] Pager [phone]

## 2017-05-29 MED ORDER — ENSURE ENLIVE PO LIQD: 237.0000 mL | Freq: Two times a day (BID) | ORAL | 12 refills | Status: AC

## 2017-05-29 MED ORDER — POTASSIUM CHLORIDE 20 MEQ PO PACK: 10.0000 meq | PACK | Freq: Every day | ORAL | 0 refills | Status: DC

## 2017-05-29 MED ORDER — POTASSIUM CHLORIDE 20 MEQ PO PACK
10.0000 meq | PACK | Freq: Every day | ORAL | Status: DC
  Filled 2017-05-29: qty 1

## 2017-05-29 MED ORDER — PANTOPRAZOLE SODIUM 40 MG PO TBEC: 40.0000 mg | DELAYED_RELEASE_TABLET | Freq: Two times a day (BID) | ORAL | 0 refills | Status: AC

## 2017-05-29 NOTE — Consult Note (Signed)
   [facility] CM Inpatient Consult   05/29/2017  TRENIA TENNYSON 05/21/1918 [id number]   Patient screened and evaluated for [place] [facility] services.  Patient had previously been outreached by [facility] Telephonic for EMMI calls follow up.  Patient is currently recommended to discharge to a skilled nursing facility.    No community follow up needed at this time.   Natividad Brood, RN BSN [facility] Liaison  [phone] business mobile phone Toll free office [phone]

## 2017-05-29 NOTE — Progress Notes (Signed)
ON ASSESSMENT DISCOVERED NEW SKIN TEAR TO POSTERIOR R UPPER LEG, BRIGHT RED BLOOD WTH INTACT EDGE. DRESSED WITH GAUZE/TAPE. FOAM PROTECTOR PLACED TO REDDENED AREA ON SACRUM.

## 2017-05-29 NOTE — Clinical Social Work Placement (Signed)
Nurse to call report to [phone], Room 511    CLINICAL SOCIAL WORK PLACEMENT  NOTE  Date:  05/29/2017  Patient Details  Name: Krystal Hensley MRN: [id number] Date of Birth: [birth year]  Clinical Social Work is seeking post-discharge placement for this patient at the [place] level of care (*CSW will initial, date and re-position this form in  chart as items are completed):  Yes   Patient/family provided with [place] Work Department's list of facilities offering this level of care within the geographic area requested by the patient (or if unable, by the patient's family).  Yes   Patient/family informed of their freedom to choose among providers that offer the needed level of care, that participate in Medicare, Medicaid or managed care program needed by the patient, have an available bed and are willing to accept the patient.  Yes   Patient/family informed of [facility]'s ownership interest in [facility] and [facility], as well as of the fact that they are under no obligation to receive care at these facilities.  PASRR submitted to EDS on 05/27/17     PASRR number received on 05/27/17     Existing PASRR number confirmed on       FL2 transmitted to all facilities in geographic area requested by pt/family on 05/27/17     FL2 transmitted to all facilities within larger geographic area on       Patient informed that his/her managed care company has contracts with or will negotiate with certain facilities, including the following:        Yes   Patient/family informed of bed offers received.  Patient chooses bed at [facility]     Physician recommends and patient chooses bed at      Patient to be transferred to [facility] on 05/29/17.  Patient to be transferred to facility by [facility]     Patient family notified on 05/29/17 of transfer.  Name of family member notified:  Charlene     PHYSICIAN    Additional Comment:    _______________________________________________ Geralynn Ochs, LCSW 05/29/2017, 12:43 PM

## 2017-05-29 NOTE — Progress Notes (Addendum)
Tx performed and documented by SPTA under direct guidance and supervision of PTA at all times.  Charting reviewed for accuracy and depicts tx performed and services provided.  Governor Rooks, PTA pager [phone]   Physical Therapy Treatment Patient Details Name: Krystal Hensley MRN: [id number] DOB: 1917/11/25 Today's Date: 05/29/2017    History of Present Illness 83 y.o. female admitted with difficulty speaking (expressive aphasia) and dark stool. Upon arrival, hemoglobin 6.8, concern for GI bleed, and A-fib present with RVR. Head CT negative. Brain MRI shows small acute ischemic changes of L cerebellum and thalamus. PMH significant of CHF, HTN, PAF, pulmonary HTN, CVA, colon cancer, and polymyalgia rheumatica.      PT Comments    Pt in bed upon arrival. Agreed to get OOB but very anxious about falling. VCs for hand placement and min assist to roll to side. Pt ambulated with rolator to sink where had seated rest break to clean dentures. Ambulated to restroom. Min guard for sit<>stand on toilet seat. Pt became very anxious saying that she could not breathe. O2 sats 94% RA. Instructed pt on pursed lip breathing. Pt able to get up and ambulate to recliner. Pt fatigued from session. Pt would benefit from SNF due to impulsiveness and assistance required for ADLS and functional mobility. She would benefit from skilled PT to improve endurance, strength, and functional independence. Informed supervising PT about need for change in recommendations. PT in agreement.  Follow Up Recommendations  SNF;Supervision/Assistance - 24 hour     Equipment Recommendations  None recommended by PT    Recommendations for Other Services       Precautions / Restrictions Precautions Precautions: Fall Restrictions Weight Bearing Restrictions: No    Mobility  Bed Mobility Overal bed mobility: Needs Assistance Bed Mobility: Supine to Sit     Supine to sit: Min assist;HOB elevated     General bed  mobility comments: VCs for hand placement. Assist to roll to side.   Transfers Overall transfer level: Needs assistance Equipment used: 4-wheeled walker(Rolator) Transfers: Sit to/from Stand Sit to Stand: Min guard         General transfer comment: Pt demonstrates impulsiveness and apprehensive about transfers. VCs for hand placement. No physical assist needed but min guard for safety.  Ambulation/Gait Ambulation/Gait assistance: Min assist Ambulation Distance (Feet): 5 Feet(30ft first trial; 5 ft second trial; 10 ft third trial; seated breaks inbetween) Assistive device: 4-wheeled walker(Rolator) Gait Pattern/deviations: Step-through pattern;Trunk flexed;Narrow base of support;Decreased stride length Gait velocity: decreased   General Gait Details: Pt demonstrates unsteady and impulsive gait. Difficulty manuevering RW. Fatigues quickly. Anxious about ambulating and falling.    Stairs            Wheelchair Mobility    Modified Rankin (Stroke Patients Only)       Balance Overall balance assessment: Needs assistance Sitting-balance support: Feet supported;Bilateral upper extremity supported Sitting balance-Leahy Scale: Good Sitting balance - Comments: Pt able to sit EOB with feet supported. Supervision for safety.    Standing balance support: Bilateral upper extremity supported;During functional activity Standing balance-Leahy Scale: Poor Standing balance comment: reliant on rollator                            Cognition Arousal/Alertness: Awake/alert Behavior During Therapy: WFL for tasks assessed/performed Overall Cognitive Status: Difficult to assess  Exercises      General Comments        Pertinent Vitals/Pain Pain Assessment: No/denies pain    Home Living                      Prior Function            PT Goals (current goals can now be found in the care plan section)  Acute Rehab PT Goals Patient Stated Goal: non discussed PT Goal Formulation: With patient Time For Goal Achievement: 06/09/17 Potential to Achieve Goals: Good Progress towards PT goals: Progressing toward goals    Frequency    Min 3X/week      PT Plan Discharge plan needs to be updated    Co-evaluation              AM-PAC PT "6 Clicks" Daily Activity  Outcome Measure  Difficulty turning over in bed (including adjusting bedclothes, sheets and blankets)?: Unable Difficulty moving from lying on back to sitting on the side of the bed? : Unable Difficulty sitting down on and standing up from a chair with arms (e.g., wheelchair, bedside commode, etc,.)?: Unable Help needed moving to and from a bed to chair (including a wheelchair)?: A Little Help needed walking in hospital room?: A Little Help needed climbing 3-5 steps with a railing? : A Lot 6 Click Score: 11    End of Session Equipment Utilized During Treatment: Gait belt Activity Tolerance: Patient limited by fatigue;Patient tolerated treatment well Patient left: in chair;with family/visitor present;with call bell/phone within reach   PT Visit Diagnosis: Unsteadiness on feet (R26.81);Muscle weakness (generalized) (M62.81);Difficulty in walking, not elsewhere classified (R26.2);Other abnormalities of gait and mobility (R26.89)     Time: 6073-7106 PT Time Calculation (min) (ACUTE ONLY): 22 min  Charges:  $Therapeutic Activity: 8-22 mins                    G Codes:       Janna Arch, SPTA   Janna Arch 05/29/2017, 11:10 AM

## 2017-05-29 NOTE — Progress Notes (Signed)
Initial Nutrition Assessment  DOCUMENTATION CODES:   Not applicable  INTERVENTION:  Upon discharge to SNF, recommend continuation of nutritional supplements to aid in adequate nutrition.   NUTRITION DIAGNOSIS:   Increased nutrient needs related to chronic illness as evidenced by estimated needs.  GOAL:   Patient will meet greater than or equal to 90% of their needs  MONITOR:   PO intake, Supplement acceptance, Labs, Weight trends, I & O's, Skin  REASON FOR ASSESSMENT:   Malnutrition Screening Tool    ASSESSMENT:   83 y.o. female with medical history significant of colon cancer status post partial colectomy, CHF, hypertension, PAF, pollen nausea rheumatic, ulnar hypertension, CVA. Patient reports approximately 2 day history of intermittent dark tarry stools  Patient was admitted to the hospital working diagnosis of symptomatic anemia due to lower GI bleed complicated by expressive aphasia, and atrial fibrillation with rapid ventricular response.  Meal completion has been 75%. Pt reports having a good appetite currently and PTA with usual consumption of at least 3 meals a day with Ensure shakes twice daily. Noted pt with a 9% weight loss in 1 month, however weight loss likely related to fluid status as pt with hx of CHF. Pt currently has Ensure ordered and has been consuming them. Plans for discharge to SNF today. Recommend continuation of nutritional supplements at SNF to aid in adequate nutrition. Labs and medications reviewed.   NUTRITION - FOCUSED PHYSICAL EXAM:    Most Recent Value  Orbital Region  Unable to assess  Upper Arm Region  No depletion  Thoracic and Lumbar Region  No depletion  Buccal Region  Unable to assess  Temple Region  Unable to assess  Clavicle Bone Region  Moderate depletion  Clavicle and Acromion Bone Region  Moderate depletion  Scapular Bone Region  Unable to assess  Dorsal Hand  Unable to assess  Patellar Region  Severe depletion  Anterior Thigh  Region  Moderate depletion  Posterior Calf Region  Moderate depletion  Edema (RD Assessment)  None  Hair  Reviewed  Eyes  Reviewed  Mouth  Reviewed  Skin  Reviewed  Nails  Reviewed       Diet Order:  Diet regular Room service appropriate? Yes; Fluid consistency: Thin Diet - low sodium heart healthy  EDUCATION NEEDS:   Education needs have been addressed  Skin:  Skin Assessment: Reviewed RN Assessment  Last BM:  11/20  Height:   Ht Readings from Last 1 Encounters:  05/25/17 5\' 1"  (1.549 m)    Weight:   Wt Readings from Last 1 Encounters:  05/25/17 109 lb 12.6 oz (49.8 kg)    Ideal Body Weight:  47.7 kg  BMI:  Body mass index is 20.74 kg/m.  Estimated Nutritional Needs:   Kcal:  1300-1500  Protein:  55-65 grams  Fluid:  1.3 - 1.5 L/day    Corrin Parker, MS, RD, LDN Pager # [phone] After hours/ weekend pager # [phone]

## 2017-05-29 NOTE — Discharge Summary (Signed)
Physician Discharge Summary  Krystal Hensley ZHY:[id number] DOB: 04-21-1918 DOA: 05/25/2017  PCP: Lajean Manes, MD  Admit date: 05/25/2017 Discharge date: 05/29/2017  Admitted From: home Disposition:  snf  Recommendations for Outpatient Follow-up:  1. Follow up with PCP in 1-week 2. Patient placed on low dose aspirin 3. Continue twice daily pantoprazole.   Home Health: No  Equipment/Devices: No   Discharge Condition: Stable CODE STATUS: DNR  Diet recommendation: Heart healthy.   Brief/Interim Summary: 83 year old female who presented with difficulty speaking. She does have significant past medical history for colon cancer status post partial colectomy, hypertension, paroxysmal atrial fibrillation, and history of CVA. Patient reported today history of intermittent dark stools, without chest pain or dyspnea. On the day of admission she was noted to have difficulty speaking. On her initial physical examination blood pressure 129/91, heart rate 65, respiratory 22, oxygen saturation 98%. She had dry mucous membranes, lungs were clear to auscultation bilaterally, heart S1-S2 present, irregullary irregular, abdomen soft nontender, no lower extremity edema, neurologically patient was intact. Sodium 141, potassium 3.9, chloride 111, bicarbonate 21, glucose 153, BUN 33, creatinine 0.73, white count 12.5, hemoglobin 6.8, hematocrit 21.7, platelets 216, urinalysis negative for infection, positive fecal occult blood, head CT with no acute changes, chest film with left rotation, positive cardiomegaly, no infiltrates. EKG, atrial fibrillation, rate 116 bpm, left axis deviation, left anterior fascicular block, poor R-wave progression, lateral ST depressions V5 and V6.   Patient was admitted to the hospital working diagnosis of symptomatic anemia due to lower GI bleed complicated by expressive aphasia, and atrial fibrillation with rapid ventricular response.  1. Symptomatic anemia, with acute blood  loss anemia, due to upper GI bleed, duodenal ulcers. Patient was admitted to the medical unit, she was placed on remote telemetry monitor, she received 2 units of packed red blood cells transfusion with good toleration. She was placed on a pantoprazole continuous infusion. Her hemoglobin and hematocrit remained stable. She was seen by gastroenterology, and underwent upper endoscopy which showed short segment of Barrett's esophagus, grade B esophagitis, erythematous mucosa in the cardia, gastric fundus, body and antrum. Multiple oozing duodenal ulcers with oozing hemorrhage, inject, clips were placed. Patient was continued on pantoprazole infusion for 48 hours and then transitioned to oral 40 mg twice daily. Due to increased cardiovascular risk aspirin has been resumed at discharge with 81 mg daily.   2. Acute ischemic CVA, left cerebellum. Patient had frequent neuro checks, her aphasia improved, she was seen by speech therapy with recommendations to continue rehabilitation at a skilled nursing facility. Further workup with brain MRI showed patchy small volume acute ischemic nonhemorrhagic left cerebellar infarcts, remote lacunar infarcts involving the left thalamus and bilateral cerebellar hemispheres, MRA was negative, carotid ultrasound with no ICA stenosis. With recommendations to continue aspirin for anticoagulation. Apparently she had declined apixaban therapy as an outpatient.   3. Atrial fibrillation with rapid ventricular response. Patient was placed on the remote telemetry, rate control was achieved with AV blockade, diltiazem and metoprolol. , In the setting of recent upper GI bleed, further anticoagulation with aspirin. As an outpatient she had declined apixaban therapy.   4. Chronic diastolic heart failure, stable with no exacerbation. Patient remained hemodynamically stable, her diuretic therapy was resumed. Continue beta-blockade with metoprolol. Will add kcl supplements.   5. Depression.  Continue buspirone, no agitation or confusion.  6. Polymyalgia rheumatica. Continue home dose of prednisone.  Discharge Diagnoses:  Active Problems:   PMR (polymyalgia rheumatica) (HCC)   Hx of  PAF- CHADS VASC=7   Atrial fibrillation with RVR (HCC)   Chronic diastolic CHF (congestive heart failure) (HCC)   TIA (transient ischemic attack)   History of CVA (cerebrovascular accident)   Symptomatic anemia   Upper GI bleed    Discharge Instructions  Discharge Instructions    Amb referral to AFIB Clinic   Complete by:  As directed    Ambulatory referral to Neurology   Complete by:  As directed    An appointment is requested in approximately: 6 weeks Follow up with stroke clinic Cecille Rubin preferred, if not available, then consider [facility] Chestnut, [facility] or Jaynee Eagles whoever is available) at [facility] in about 6-8 weeks. Thanks.     Allergies as of 05/29/2017      Reactions   Chocolate Other (See Comments)   Unknown   Statins Other (See Comments)   myalgia      Medication List    STOP taking these medications   omeprazole 20 MG capsule Commonly known as:  PRILOSEC   silver sulfADIAZINE 1 % cream Commonly known as:  SILVADENE   traMADol 50 MG tablet Commonly known as:  ULTRAM     TAKE these medications   acetaminophen 325 MG tablet Commonly known as:  TYLENOL Take 325 mg every 6 (six) hours as needed by mouth for moderate pain.   aspirin 81 MG chewable tablet Chew 81 mg by mouth daily.   busPIRone 5 MG tablet Commonly known as:  BUSPAR Take 5 mg by mouth daily.   diltiazem 180 MG 24 hr capsule Commonly known as:  CARDIZEM CD Take 1 capsule (180 mg total) by mouth daily.   feeding supplement (ENSURE ENLIVE) Liqd Take 237 mLs by mouth 2 (two) times daily between meals.   furosemide 20 MG tablet Commonly known as:  LASIX Take 20 mg by mouth daily.   metoprolol succinate 50 MG 24 hr tablet Commonly known as:  TOPROL-XL take 1 tablet by mouth once daily with food  or IMMEDIATELY FOLLOWING A MEAL   pantoprazole 40 MG tablet Commonly known as:  PROTONIX Take 1 tablet (40 mg total) by mouth 2 (two) times daily.   polyethylene glycol packet Commonly known as:  MIRALAX / GLYCOLAX Take 17 g by mouth daily.   potassium chloride 20 MEQ packet Commonly known as:  KLOR-CON Take 10 mEq by mouth daily.   predniSONE 5 MG tablet Commonly known as:  DELTASONE Take 5 mg by mouth daily.   sennosides-docusate sodium 8.6-50 MG tablet Commonly known as:  SENOKOT-S Take 1 tablet by mouth daily as needed for constipation.   VISION FORMULA/LUTEIN PO Take 1 tablet by mouth 2 (two) times daily.      Follow-up Information    Dennie Bible, NP. Schedule an appointment as soon as possible for a visit in 6 week(s).   Specialty:  Family Medicine Contact information: [address] [place] [address] [phone]          Allergies  Allergen Reactions  . Chocolate Other (See Comments)    Unknown  . Statins Other (See Comments)    myalgia    Consultations:  Neurology   Gastroenterology    Procedures/Studies: Ct Head Wo Contrast  Result Date: 05/25/2017 CLINICAL DATA:  Altered mental status. EXAM: CT HEAD WITHOUT CONTRAST TECHNIQUE: Contiguous axial images were obtained from the base of the skull through the vertex without intravenous contrast. COMPARISON:  CT scan of December 07, 2014. FINDINGS: Brain: Mild diffuse cortical atrophy is  noted. Mild chronic ischemic white matter disease is noted. No mass effect or midline shift is noted. Ventricular size is within normal limits. There is no evidence of mass lesion, hemorrhage or acute infarction. Vascular: No hyperdense vessel or unexpected calcification. Skull: Normal. Negative for fracture or focal lesion. Sinuses/Orbits: Mild sphenoid sinusitis is noted. Other: None. IMPRESSION: Mild diffuse cortical atrophy. Mild chronic ischemic white matter disease. No acute intracranial  abnormality seen. Electronically Signed   By: Marijo Conception, M.D.   On: 05/25/2017 08:51   Mr Brain Wo Contrast  Result Date: 05/26/2017 CLINICAL DATA:  Initial evaluation for acute speech difficulty. EXAM: MRI HEAD WITHOUT CONTRAST MRA HEAD WITHOUT CONTRAST TECHNIQUE: Multiplanar, multiecho pulse sequences of the brain and surrounding structures were obtained without intravenous contrast. Angiographic images of the head were obtained using MRA technique without contrast. COMPARISON:  Prior CT from 05/25/2017. FINDINGS: MRI HEAD FINDINGS Brain: Study moderately degraded by motion artifact. Diffuse prominence of the CSF containing spaces compatible with generalized cerebral atrophy. Patchy and confluent T2/FLAIR hyperintensity within the periventricular and deep white matter both cerebral hemispheres most compatible chronic small vessel ischemic disease, fairly advanced in nature. Few scattered remote bilateral cerebellar infarcts. Remote lacunar infarct present within the left thalamus. Patchy small volume acute ischemic infarcts present within the left cerebellar hemisphere (series 4, image 9). Largest area of infarction measures 6 mm. No associated hemorrhage or mass effect. No other evidence for acute or subacute infarct. Gray-white matter differentiation otherwise maintained. No other evidence for acute or chronic intracranial hemorrhage. No other areas of chronic infarction. No mass lesion, midline shift or mass effect. No hydrocephalus. No extra-axial fluid collection. Major dural sinuses grossly patent. Pituitary gland normal.  Midline structures intact and normal. Vascular: Major intracranial vascular flow voids maintained. Skull and upper cervical spine: Degenerative thickening at the tectorial membrane with mild narrowing at the craniocervical junction. Visualized upper cervical spine grossly unremarkable. Bone marrow signal intensity within normal limits. No scalp soft tissue abnormality.  Sinuses/Orbits: Globes and orbital soft tissues within normal limits. Patient status post cataract extraction bilaterally. Paranasal sinuses clear. No mastoid effusion. Inner ear structures grossly normal. Other: None. MRA HEAD FINDINGS ANTERIOR CIRCULATION: Study degraded by motion artifact. Distal cervical segments of the internal carotid arteries are patent with antegrade flow. Petrous, cavernous and supraclinoid segments patent without flow-limiting stenosis. A1 segments widely patent. Normal anterior communicating artery. Anterior cerebral arteries patent to their distal aspects without stenosis. M1 segments demonstrate probable atheromatous irregularity without high-grade flow-limiting stenosis. Normal MCA bifurcations. No proximal M2 occlusion. Distal MCA branches well perfused and symmetric. POSTERIOR CIRCULATION: Vertebral arteries patent to the vertebrobasilar junction. Left vertebral artery dominant. Posterior inferior cerebral arteries patent proximally. Basilar artery patent to its distal aspect without stenosis. Superior cerebral arteries patent proximally. Right PCA supplied via the basilar as well as a prominent right posterior communicating artery. Fetal type origin of the left PCA. PCAs patent to their distal aspects without stenosis. No intracranial aneurysm. IMPRESSION: MRI HEAD IMPRESSION: 1. Patchy small volume acute ischemic nonhemorrhagic left cerebellar infarcts. 2. Remote lacunar infarcts involving the left thalamus and bilateral cerebellar hemispheres. 3. Age-related atrophy with advanced chronic microvascular ischemic disease. MRA HEAD IMPRESSION: Negative intracranial MRA. No large vessel occlusion. No high-grade or correctable stenosis. Electronically Signed   By: Jeannine Boga M.D.   On: 05/26/2017 01:23   Dg Chest Port 1 View  Result Date: 05/25/2017 CLINICAL DATA:  Atrial fibrillation EXAM: PORTABLE CHEST 1 VIEW COMPARISON:  11/10/2016 FINDINGS:  Cardiomegaly with vascular  congestion. Left lower lobe atelectasis or infiltrate. Right lung is clear. No acute bony abnormality. Advanced degenerative changes in the shoulders. IMPRESSION: Cardiomegaly with vascular congestion. Left lower lobe atelectasis or infiltrate. This is similar to prior study. Electronically Signed   By: Rolm Baptise M.D.   On: 05/25/2017 09:13   Mr Virgel Paling OA Contrast  Result Date: 05/26/2017 CLINICAL DATA:  Initial evaluation for acute speech difficulty. EXAM: MRI HEAD WITHOUT CONTRAST MRA HEAD WITHOUT CONTRAST TECHNIQUE: Multiplanar, multiecho pulse sequences of the brain and surrounding structures were obtained without intravenous contrast. Angiographic images of the head were obtained using MRA technique without contrast. COMPARISON:  Prior CT from 05/25/2017. FINDINGS: MRI HEAD FINDINGS Brain: Study moderately degraded by motion artifact. Diffuse prominence of the CSF containing spaces compatible with generalized cerebral atrophy. Patchy and confluent T2/FLAIR hyperintensity within the periventricular and deep white matter both cerebral hemispheres most compatible chronic small vessel ischemic disease, fairly advanced in nature. Few scattered remote bilateral cerebellar infarcts. Remote lacunar infarct present within the left thalamus. Patchy small volume acute ischemic infarcts present within the left cerebellar hemisphere (series 4, image 9). Largest area of infarction measures 6 mm. No associated hemorrhage or mass effect. No other evidence for acute or subacute infarct. Gray-white matter differentiation otherwise maintained. No other evidence for acute or chronic intracranial hemorrhage. No other areas of chronic infarction. No mass lesion, midline shift or mass effect. No hydrocephalus. No extra-axial fluid collection. Major dural sinuses grossly patent. Pituitary gland normal.  Midline structures intact and normal. Vascular: Major intracranial vascular flow voids maintained. Skull and upper cervical  spine: Degenerative thickening at the tectorial membrane with mild narrowing at the craniocervical junction. Visualized upper cervical spine grossly unremarkable. Bone marrow signal intensity within normal limits. No scalp soft tissue abnormality. Sinuses/Orbits: Globes and orbital soft tissues within normal limits. Patient status post cataract extraction bilaterally. Paranasal sinuses clear. No mastoid effusion. Inner ear structures grossly normal. Other: None. MRA HEAD FINDINGS ANTERIOR CIRCULATION: Study degraded by motion artifact. Distal cervical segments of the internal carotid arteries are patent with antegrade flow. Petrous, cavernous and supraclinoid segments patent without flow-limiting stenosis. A1 segments widely patent. Normal anterior communicating artery. Anterior cerebral arteries patent to their distal aspects without stenosis. M1 segments demonstrate probable atheromatous irregularity without high-grade flow-limiting stenosis. Normal MCA bifurcations. No proximal M2 occlusion. Distal MCA branches well perfused and symmetric. POSTERIOR CIRCULATION: Vertebral arteries patent to the vertebrobasilar junction. Left vertebral artery dominant. Posterior inferior cerebral arteries patent proximally. Basilar artery patent to its distal aspect without stenosis. Superior cerebral arteries patent proximally. Right PCA supplied via the basilar as well as a prominent right posterior communicating artery. Fetal type origin of the left PCA. PCAs patent to their distal aspects without stenosis. No intracranial aneurysm. IMPRESSION: MRI HEAD IMPRESSION: 1. Patchy small volume acute ischemic nonhemorrhagic left cerebellar infarcts. 2. Remote lacunar infarcts involving the left thalamus and bilateral cerebellar hemispheres. 3. Age-related atrophy with advanced chronic microvascular ischemic disease. MRA HEAD IMPRESSION: Negative intracranial MRA. No large vessel occlusion. No high-grade or correctable stenosis.  Electronically Signed   By: Jeannine Boga M.D.   On: 05/26/2017 01:23       Subjective: Patient feeling better, no nausea or vomiting, no chest pain, or dyspnea, positive generalized weakness.   Discharge Exam: Vitals:   05/29/17 0020 05/29/17 0854  BP: (!) 106/52   Pulse: 87   Resp: 18   Temp: 98.2 F (36.8 C)   SpO2: 93%  97%   Vitals:   05/28/17 1843 05/28/17 2124 05/29/17 0020 05/29/17 0854  BP: (!) 94/42 111/60 (!) 106/52   Pulse: 89 85 87   Resp: 18 18 18    Temp: 98.2 F (36.8 C) 98.3 F (36.8 C) 98.2 F (36.8 C)   TempSrc: Oral Oral Oral   SpO2: (!) 63% 92% 93% 97%  Weight:      Height:        General: Pt is alert, awake, not in acute distress E ENT; mild pallor, no icterus Cardiovascular: irregulary-irregular, S1/S2 +, no rubs, no gallops Respiratory: CTA bilaterally, no wheezing, no rhonchi Abdominal: Soft, NT, ND, bowel sounds + Extremities: no edema, no cyanosis    The results of significant diagnostics from this hospitalization (including imaging, microbiology, ancillary and laboratory) are listed below for reference.     Microbiology: No results found for this or any previous visit (from the past 240 hour(s)).   Labs: BNP (last 3 results) Recent Labs    11/09/16 1200  BNP 694.8*   Basic Metabolic Panel: Recent Labs  Lab 05/25/17 0720 05/25/17 2220 05/26/17 0442 05/27/17 0309 05/28/17 0153  NA 141 138 139 140 138  K 3.9 4.3 3.9 3.5 3.4*  CL 111 111 111 112* 109  CO2 21* 21* 20* 22 24  GLUCOSE 153* 120* 101* 83 93  BUN 33* 29* 27* 21* 16  CREATININE 0.73 0.64 0.61 0.60 0.65  CALCIUM 8.1* 7.9* 7.9* 7.8* 7.6*  MG 2.3  --   --   --   --   PHOS 3.0  --   --   --   --    Liver Function Tests: Recent Labs  Lab 05/25/17 0720 05/25/17 2220  AST 30 31  ALT 19 19  ALKPHOS 73 70  BILITOT 0.5 3.0*  PROT 4.7* 4.6*  ALBUMIN 2.5* 2.5*   No results for input(s): LIPASE, AMYLASE in the last 168 hours. No results for input(s):  AMMONIA in the last 168 hours. CBC: Recent Labs  Lab 05/25/17 0720 05/25/17 2220 05/26/17 0442 05/27/17 0309 05/28/17 0153  WBC 11.5*  12.5* 12.4* 11.9* 9.7 9.8  NEUTROABS 9.4*  9.6*  --   --   --  7.6  HGB 6.6*  6.8* 9.2* 8.9* 8.6* 8.3*  HCT 20.8*  21.7* 27.2* 27.9* 27.1* 26.2*  MCV 104.0*  103.8* 100.4* 96.9 101.1* 101.6*  PLT 201  216 179 182 175 158   Cardiac Enzymes: No results for input(s): CKTOTAL, CKMB, CKMBINDEX, TROPONINI in the last 168 hours. BNP: Invalid input(s): POCBNP CBG: Recent Labs  Lab 05/25/17 0714  GLUCAP 150*   D-Dimer No results for input(s): DDIMER in the last 72 hours. Hgb A1c No results for input(s): HGBA1C in the last 72 hours. Lipid Profile No results for input(s): CHOL, HDL, LDLCALC, TRIG, CHOLHDL, LDLDIRECT in the last 72 hours. Thyroid function studies No results for input(s): TSH, T4TOTAL, T3FREE, THYROIDAB in the last 72 hours.  Invalid input(s): FREET3 Anemia work up No results for input(s): VITAMINB12, FOLATE, FERRITIN, TIBC, IRON, RETICCTPCT in the last 72 hours. Urinalysis    Component Value Date/Time   COLORURINE YELLOW 05/25/2017 0738   APPEARANCEUR CLEAR 05/25/2017 0738   LABSPEC 1.021 05/25/2017 0738   PHURINE 5.0 05/25/2017 0738   GLUCOSEU NEGATIVE 05/25/2017 0738   HGBUR NEGATIVE 05/25/2017 0738   BILIRUBINUR NEGATIVE 05/25/2017 0738   KETONESUR NEGATIVE 05/25/2017 0738   PROTEINUR NEGATIVE 05/25/2017 0738   UROBILINOGEN 0.2 12/07/2014 1325   NITRITE NEGATIVE  05/25/2017 0738   LEUKOCYTESUR NEGATIVE 05/25/2017 0738   Sepsis Labs Invalid input(s): PROCALCITONIN,  WBC,  LACTICIDVEN Microbiology No results found for this or any previous visit (from the past 240 hour(s)).   Time coordinating discharge: 45 minutes  SIGNED:   Tawni Millers, MD  [facility] 05/29/2017, 10:03 AM Pager [phone]  If 7PM-7AM, please contact night-coverage [email or web address] Password [email or web address]

## 2017-05-29 NOTE — Care Management Note (Signed)
Case Management Note  Patient Details  Name: MARITSA HUNSUCKER MRN: [id number] Date of Birth: 1917-07-25  Subjective/Objective:                    Action/Plan: Pt reassessed and recommendations are for SNF. CSW aware. Plan is for patient to d/c to SNF today. No further needs per CM.  Expected Discharge Date:  05/29/17               Expected Discharge Plan:  [place]  In-House Referral:  Clinical Social Work  Discharge planning Services  CM Consult  Post Acute Care Choice:    Choice offered to:     DME Arranged:    DME Agency:     HH Arranged:    [place] Agency:     Status of Service:  Completed, signed off  If discussed at [employer], dates discussed:    Additional Comments:  Pollie Friar, RN 05/29/2017, 11:44 AM

## 2017-06-01 ENCOUNTER — Non-Acute Institutional Stay (SKILLED_NURSING_FACILITY): Payer: Medicare Other | Admitting: Internal Medicine

## 2017-06-01 ENCOUNTER — Encounter: Payer: Self-pay | Admitting: Internal Medicine

## 2017-06-01 DIAGNOSIS — I5032 Chronic diastolic (congestive) heart failure: Secondary | ICD-10-CM

## 2017-06-01 DIAGNOSIS — M353 Polymyalgia rheumatica: Secondary | ICD-10-CM | POA: Diagnosis not present

## 2017-06-01 DIAGNOSIS — F419 Anxiety disorder, unspecified: Secondary | ICD-10-CM

## 2017-06-01 DIAGNOSIS — I4891 Unspecified atrial fibrillation: Secondary | ICD-10-CM | POA: Diagnosis not present

## 2017-06-01 DIAGNOSIS — K922 Gastrointestinal hemorrhage, unspecified: Secondary | ICD-10-CM

## 2017-06-01 DIAGNOSIS — K264 Chronic or unspecified duodenal ulcer with hemorrhage: Secondary | ICD-10-CM

## 2017-06-01 DIAGNOSIS — I639 Cerebral infarction, unspecified: Secondary | ICD-10-CM | POA: Diagnosis not present

## 2017-06-01 NOTE — Progress Notes (Signed)
: Provider:  Noah Delaine. Sheppard Coil, MD Location:  [facility] Room Number: 426 Place of Service:  SNF ([phone])  PCP: Lajean Manes, MD Patient Care Team: Lajean Manes, MD as PCP - General (Internal Medicine)  Extended Emergency Contact Information Primary Emergency Contact: Smith,Charlene W Address: [address]          [place] [address] Johnnette Litter of [place] Phone: [phone] Mobile Phone: [phone] Relation: Daughter Secondary Emergency Contact: Marrion Coy [place] of [place] Mobile Phone: [phone] Relation: Son     Allergies: Chocolate and Statins  Chief Complaint  Patient presents with  . New Admit To SNF    following hospitalization 05/25/17 to 05/29/17 symptomatic anemia    HPI: Patient is 83 y.o. female with colon cancer status post partial colectomy, hypertension, PAF, polymyalgia rheumatica, hypertension, and CVA who presented to [facility] ED complaining of 2 days of intermittent dark tarry stools. Patient denies any crampy abdominal pain, bright red blood per rectum, hematochezia, hematemesis, abdominal pain, chest pain shortness of breath, palpitations, dysuria or frequency. Additionally admission the patient awoke at approximately 5 AM and was noted to have difficulty speaking. EMS was called on arrival 20 minutes later the symptoms began to resolve upon arrival. Upon arrival to the ED symptoms had resolved resolved. Patient was brought in to ED with course of being in A. fib with RVR per EMS. Patient was admitted to [facility] from 11/19-23 where she was transfused 2 units PRBCs and endoscopy revealed esophagitis and mucosa irritation in the cardia, gastric fundus body and a troponin as well as multiple oozing duodenal ulcers which were injected and clipped. MRI brain showed patchy small  lacunar ischemic nonhemorrhagic left cerebellar infarcts. Patient will be anticoagulated with aspirin. Hospital course was  further complicated by atrial flutter with RVR treated with diltiazem and metoprolol with good rate control. Patient had declined anticoagulation as an outpatient so will be prophylaxed with ASA. Patient is admitted to skilled nursing facility for OT/PT. While at skilled nursing facility patient will be followed for chronic diastolic heart failure treated with Lasix, metoprolol, and potassium, polymyalgia rheumatica treated with prednisone and anxiety treated with BuSpar.  Past Medical History:  Diagnosis Date  . Abdominal pain, right upper quadrant 07/24/2014  . Acute on chronic diastolic CHF (congestive heart failure), NYHA class 2 ([place]) 02/15/2015  . Arthritis   . Atrial fibrillation with RVR ([place]) 06/14/2014  . Back pain   . Cerebral infarction ([place]) 08/08/2013  . Chronic diastolic CHF (congestive heart failure) ([place])   . Colon cancer ([place])   . Dyspnea 07/24/2014  . History of CVA (cerebrovascular accident) 05/25/2017  . HTN (hypertension) 08/08/2013  . Hypertension   . Hypoglycemia   . PAF (paroxysmal atrial fibrillation) ([place])   . PMR (polymyalgia rheumatica) ([place]) 08/08/2013  . Polymyalgia rheumatica ([place])   . Pulmonary hypertension ([place])    a. Echo 07/2014 - mildly increased PASP.  Marland Kitchen Right knee injury, subsequent encounter 01/14/2017  . Spastic hemiplegia affecting dominant side ([place]) 08/08/2013  . Stroke ([place])   . Symptomatic anemia 05/25/2017  . Tachycardia 01/14/2017  . TIA (transient ischemic attack) 05/25/2017  . Upper GI bleed 05/25/2017    Past Surgical History:  Procedure Laterality Date  . BILATERAL OOPHORECTOMY    . CESAREAN SECTION    . COLON SURGERY    . ESOPHAGOGASTRODUODENOSCOPY (EGD) WITH PROPOFOL N/A 05/26/2017   Procedure: ESOPHAGOGASTRODUODENOSCOPY (EGD) WITH PROPOFOL;  Surgeon: [name], MD;  Location: [facility] END[facility];  Service: Gastroenterology;  Laterality: N/A;  . EYE SURGERY      Allergies as of 06/01/2017      Reactions   Chocolate Other (See Comments)    Unknown   Statins Other (See Comments)   myalgia      Medication List        Accurate as of 06/01/17  1:36 PM. Always use your most recent med list.          acetaminophen 325 MG tablet Commonly known as:  TYLENOL Take 325 mg every 6 (six) hours as needed by mouth for moderate pain.   aspirin 81 MG chewable tablet Chew 81 mg by mouth daily.   busPIRone 5 MG tablet Commonly known as:  BUSPAR Take 5 mg by mouth daily.   diltiazem 180 MG 24 hr capsule Commonly known as:  CARDIZEM CD Take 1 capsule (180 mg total) by mouth daily.   feeding supplement (ENSURE ENLIVE) Liqd Take 237 mLs by mouth 2 (two) times daily between meals.   furosemide 20 MG tablet Commonly known as:  LASIX Take 20 mg by mouth daily.   metoprolol succinate 50 MG 24 hr tablet Commonly known as:  TOPROL-XL take 1 tablet by mouth once daily with food or IMMEDIATELY FOLLOWING A MEAL   pantoprazole 40 MG tablet Commonly known as:  PROTONIX Take 1 tablet (40 mg total) by mouth 2 (two) times daily.   polyethylene glycol packet Commonly known as:  MIRALAX / GLYCOLAX Take 17 g by mouth daily.   potassium chloride 20 MEQ packet Commonly known as:  KLOR-CON Take 10 mEq by mouth daily.   predniSONE 5 MG tablet Commonly known as:  DELTASONE Take 5 mg by mouth daily.   sennosides-docusate sodium 8.6-50 MG tablet Commonly known as:  SENOKOT-S Take 1 tablet by mouth daily as needed for constipation.   VISION FORMULA/LUTEIN PO Take 1 tablet by mouth 2 (two) times daily.       No orders of the defined types were placed in this encounter.   Immunization History  Administered Date(s) Administered  . Influenza,inj,Quad PF,6+ Mos 05/28/2015  . Tdap 11/01/2012    Social History   Tobacco Use  . Smoking status: Never Smoker  . Smokeless tobacco: Never Used  Substance Use Topics  . Alcohol use: No    Family history is   Family History  Problem Relation Age of Onset  . Aneurysm Mother   .  Heart attack Father       Review of Systems  DATA OBTAINED: from patient, nurse GENERAL:  no fevers, fatigue, appetite changes SKIN: No itching, or rash EYES: No eye pain, redness, discharge EARS: No earache, tinnitus, change in hearing NOSE: No congestion, drainage or bleeding  MOUTH/THROAT: No mouth or tooth pain, No sore throat RESPIRATORY: No cough, wheezing, SOB CARDIAC: No chest pain, palpitations, lower extremity edema  GI: No abdominal pain, No N/V/D or constipation, No heartburn or reflux  GU: No dysuria, frequency or urgency, or incontinence  MUSCULOSKELETAL: No unrelieved bone/joint pain NEUROLOGIC: No headache, dizziness or focal weakness PSYCHIATRIC: + anxiety;no sadness   Vitals:   06/01/17 1328  BP: 104/64  Pulse: 78  Resp: 18  Temp: 97.8 F (36.6 C)    SpO2 Readings from Last 1 Encounters:  05/29/17 97%   Body mass index is 20.6 kg/m.     Physical Exam  GENERAL APPEARANCE: Alert, conversant,  No acute distress.  SKIN: No diaphoresis rash HEAD: Normocephalic, atraumatic  EYES: Conjunctiva/lids clear. Pupils round, reactive. EOMs intact.  EARS: External exam WNL, canals clear. Hearing grossly normal.  NOSE: No deformity or discharge.  MOUTH/THROAT: Lips w/o lesions  RESPIRATORY: Breathing is even, unlabored. Lung sounds are clear   CARDIOVASCULAR: Heart irregularly irregular no murmurs, rubs or gallops. No peripheral edema.   GASTROINTESTINAL: Abdomen is soft, non-tender, not distended w/ normal bowel sounds. GENITOURINARY: Bladder non tender, not distended  MUSCULOSKELETAL: No abnormal joints or musculature NEUROLOGIC:  Cranial nerves 2-12 grossly intact. Moves all extremities  PSYCHIATRIC: Mood and affect appropriate to situation, no behavioral issues  Patient Active Problem List   Diagnosis Date Noted  . TIA (transient ischemic attack) 05/25/2017  . History of CVA (cerebrovascular accident) 05/25/2017  . Symptomatic anemia 05/25/2017  .  Upper GI bleed 05/25/2017  . Right knee injury, subsequent encounter 01/14/2017  . Tachycardia 01/14/2017  . Shortness of breath 11/09/2016  . Hyponatremia 11/22/2015  . Acute on chronic diastolic CHF (congestive heart failure), NYHA class 2 ([place]) 02/15/2015  . Dyspnea 07/24/2014  . Abdominal pain, right upper quadrant 07/24/2014  . Chronic diastolic CHF (congestive heart failure) ([place]) 07/24/2014  . Atrial fibrillation with RVR ([place]) 06/14/2014  . Constipation 04/03/2014  . Pulmonary hypertension ([place]) 02/02/2014  . Chest pain 01/14/2014  . Cerebral infarction ([place]) 08/08/2013  . RUE weakness 08/08/2013  . HTN (hypertension) 08/08/2013  . PMR (polymyalgia rheumatica) ([facility]) 08/08/2013  . Spastic hemiplegia affecting dominant side ([place]) 08/08/2013  . Wound of left leg 08/08/2013  . Hx of PAF- CHADS VASC=7 08/08/2013      Labs reviewed: Basic Metabolic Panel:    Component Value Date/Time   NA 138 05/28/2017 0153   NA 137 04/27/2017 1347   K 3.4 (L) 05/28/2017 0153   CL 109 05/28/2017 0153   CO2 24 05/28/2017 0153   GLUCOSE 93 05/28/2017 0153   BUN 16 05/28/2017 0153   BUN 21 04/27/2017 1347   CREATININE 0.65 05/28/2017 0153   CALCIUM 7.6 (L) 05/28/2017 0153   PROT 4.6 (L) 05/25/2017 2220   ALBUMIN 2.5 (L) 05/25/2017 2220   AST 31 05/25/2017 2220   ALT 19 05/25/2017 2220   ALKPHOS 70 05/25/2017 2220   BILITOT 3.0 (H) 05/25/2017 2220   GFRNONAA >60 05/28/2017 0153   GFRAA >60 05/28/2017 0153    Recent Labs    11/09/16 1200  05/25/17 0720  05/26/17 0442 05/27/17 0309 05/28/17 0153  NA 133*   < > 141   < > 139 140 138  K 4.8   < > 3.9   < > 3.9 3.5 3.4*  CL 101   < > 111   < > 111 112* 109  CO2 23   < > 21*   < > 20* 22 24  GLUCOSE 97   < > 153*   < > 101* 83 93  BUN 15   < > 33*   < > 27* 21* 16  CREATININE 0.67   < > 0.73   < > 0.61 0.60 0.65  CALCIUM 9.0   < > 8.1*   < > 7.9* 7.8* 7.6*  MG 2.2  --  2.3  --   --   --   --   PHOS  --   --  3.0  --   --   --    --    < > = values in this interval not displayed.   Liver Function Tests: Recent Labs    05/25/17 0720 05/25/17  2220  AST 30 31  ALT 19 19  ALKPHOS 73 70  BILITOT 0.5 3.0*  PROT 4.7* 4.6*  ALBUMIN 2.5* 2.5*   No results for input(s): LIPASE, AMYLASE in the last 8760 hours. No results for input(s): AMMONIA in the last 8760 hours. CBC: Recent Labs    05/25/17 0720  05/26/17 0442 05/27/17 0309 05/28/17 0153  WBC 11.5*  12.5*   < > 11.9* 9.7 9.8  NEUTROABS 9.4*  9.6*  --   --   --  7.6  HGB 6.6*  6.8*   < > 8.9* 8.6* 8.3*  HCT 20.8*  21.7*   < > 27.9* 27.1* 26.2*  MCV 104.0*  103.8*   < > 96.9 101.1* 101.6*  PLT 201  216   < > 182 175 158   < > = values in this interval not displayed.   Lipid Recent Labs    05/26/17 0442  CHOL 116  HDL 27*  LDLCALC 57  TRIG 160*    Cardiac Enzymes: Recent Labs    11/09/16 1658 11/09/16 2109 11/10/16 0321  TROPONINI <0.03 <0.03 <0.03   BNP: Recent Labs    11/09/16 1200  BNP 502.7*   No results found for: [facility] Lab Results  Component Value Date   HGBA1C 5.4 05/26/2017   Lab Results  Component Value Date   TSH 1.171 11/09/2016   No results found for: VITAMINB12 No results found for: FOLATE No results found for: IRON, TIBC, FERRITIN  Imaging and Procedures obtained prior to SNF admission: Ct Head Wo Contrast  Result Date: 05/25/2017 CLINICAL DATA:  Altered mental status. EXAM: CT HEAD WITHOUT CONTRAST TECHNIQUE: Contiguous axial images were obtained from the base of the skull through the vertex without intravenous contrast. COMPARISON:  CT scan of December 07, 2014. FINDINGS: Brain: Mild diffuse cortical atrophy is noted. Mild chronic ischemic white matter disease is noted. No mass effect or midline shift is noted. Ventricular size is within normal limits. There is no evidence of mass lesion, hemorrhage or acute infarction. Vascular: No hyperdense vessel or unexpected calcification. Skull: Normal. Negative for  fracture or focal lesion. Sinuses/Orbits: Mild sphenoid sinusitis is noted. Other: None. IMPRESSION: Mild diffuse cortical atrophy. Mild chronic ischemic white matter disease. No acute intracranial abnormality seen. Electronically Signed   By: Marijo Conception, M.D.   On: 05/25/2017 08:51   Mr Brain Wo Contrast  Result Date: 05/26/2017 CLINICAL DATA:  Initial evaluation for acute speech difficulty. EXAM: MRI HEAD WITHOUT CONTRAST MRA HEAD WITHOUT CONTRAST TECHNIQUE: Multiplanar, multiecho pulse sequences of the brain and surrounding structures were obtained without intravenous contrast. Angiographic images of the head were obtained using MRA technique without contrast. COMPARISON:  Prior CT from 05/25/2017. FINDINGS: MRI HEAD FINDINGS Brain: Study moderately degraded by motion artifact. Diffuse prominence of the CSF containing spaces compatible with generalized cerebral atrophy. Patchy and confluent T2/FLAIR hyperintensity within the periventricular and deep white matter both cerebral hemispheres most compatible chronic small vessel ischemic disease, fairly advanced in nature. Few scattered remote bilateral cerebellar infarcts. Remote lacunar infarct present within the left thalamus. Patchy small volume acute ischemic infarcts present within the left cerebellar hemisphere (series 4, image 9). Largest area of infarction measures 6 mm. No associated hemorrhage or mass effect. No other evidence for acute or subacute infarct. Gray-white matter differentiation otherwise maintained. No other evidence for acute or chronic intracranial hemorrhage. No other areas of chronic infarction. No mass lesion, midline shift or mass effect. No hydrocephalus. No extra-axial fluid  collection. Major dural sinuses grossly patent. Pituitary gland normal.  Midline structures intact and normal. Vascular: Major intracranial vascular flow voids maintained. Skull and upper cervical spine: Degenerative thickening at the tectorial membrane  with mild narrowing at the craniocervical junction. Visualized upper cervical spine grossly unremarkable. Bone marrow signal intensity within normal limits. No scalp soft tissue abnormality. Sinuses/Orbits: Globes and orbital soft tissues within normal limits. Patient status post cataract extraction bilaterally. Paranasal sinuses clear. No mastoid effusion. Inner ear structures grossly normal. Other: None. MRA HEAD FINDINGS ANTERIOR CIRCULATION: Study degraded by motion artifact. Distal cervical segments of the internal carotid arteries are patent with antegrade flow. Petrous, cavernous and supraclinoid segments patent without flow-limiting stenosis. A1 segments widely patent. Normal anterior communicating artery. Anterior cerebral arteries patent to their distal aspects without stenosis. M1 segments demonstrate probable atheromatous irregularity without high-grade flow-limiting stenosis. Normal MCA bifurcations. No proximal M2 occlusion. Distal MCA branches well perfused and symmetric. POSTERIOR CIRCULATION: Vertebral arteries patent to the vertebrobasilar junction. Left vertebral artery dominant. Posterior inferior cerebral arteries patent proximally. Basilar artery patent to its distal aspect without stenosis. Superior cerebral arteries patent proximally. Right PCA supplied via the basilar as well as a prominent right posterior communicating artery. Fetal type origin of the left PCA. PCAs patent to their distal aspects without stenosis. No intracranial aneurysm. IMPRESSION: MRI HEAD IMPRESSION: 1. Patchy small volume acute ischemic nonhemorrhagic left cerebellar infarcts. 2. Remote lacunar infarcts involving the left thalamus and bilateral cerebellar hemispheres. 3. Age-related atrophy with advanced chronic microvascular ischemic disease. MRA HEAD IMPRESSION: Negative intracranial MRA. No large vessel occlusion. No high-grade or correctable stenosis. Electronically Signed   By: Jeannine Boga M.D.   On:  05/26/2017 01:23   Dg Chest Port 1 View  Result Date: 05/25/2017 CLINICAL DATA:  Atrial fibrillation EXAM: PORTABLE CHEST 1 VIEW COMPARISON:  11/10/2016 FINDINGS: Cardiomegaly with vascular congestion. Left lower lobe atelectasis or infiltrate. Right lung is clear. No acute bony abnormality. Advanced degenerative changes in the shoulders. IMPRESSION: Cardiomegaly with vascular congestion. Left lower lobe atelectasis or infiltrate. This is similar to prior study. Electronically Signed   By: Rolm Baptise M.D.   On: 05/25/2017 09:13   Mr Virgel Paling LK Contrast  Result Date: 05/26/2017 CLINICAL DATA:  Initial evaluation for acute speech difficulty. EXAM: MRI HEAD WITHOUT CONTRAST MRA HEAD WITHOUT CONTRAST TECHNIQUE: Multiplanar, multiecho pulse sequences of the brain and surrounding structures were obtained without intravenous contrast. Angiographic images of the head were obtained using MRA technique without contrast. COMPARISON:  Prior CT from 05/25/2017. FINDINGS: MRI HEAD FINDINGS Brain: Study moderately degraded by motion artifact. Diffuse prominence of the CSF containing spaces compatible with generalized cerebral atrophy. Patchy and confluent T2/FLAIR hyperintensity within the periventricular and deep white matter both cerebral hemispheres most compatible chronic small vessel ischemic disease, fairly advanced in nature. Few scattered remote bilateral cerebellar infarcts. Remote lacunar infarct present within the left thalamus. Patchy small volume acute ischemic infarcts present within the left cerebellar hemisphere (series 4, image 9). Largest area of infarction measures 6 mm. No associated hemorrhage or mass effect. No other evidence for acute or subacute infarct. Gray-white matter differentiation otherwise maintained. No other evidence for acute or chronic intracranial hemorrhage. No other areas of chronic infarction. No mass lesion, midline shift or mass effect. No hydrocephalus. No extra-axial fluid  collection. Major dural sinuses grossly patent. Pituitary gland normal.  Midline structures intact and normal. Vascular: Major intracranial vascular flow voids maintained. Skull and upper cervical spine: Degenerative thickening at the  tectorial membrane with mild narrowing at the craniocervical junction. Visualized upper cervical spine grossly unremarkable. Bone marrow signal intensity within normal limits. No scalp soft tissue abnormality. Sinuses/Orbits: Globes and orbital soft tissues within normal limits. Patient status post cataract extraction bilaterally. Paranasal sinuses clear. No mastoid effusion. Inner ear structures grossly normal. Other: None. MRA HEAD FINDINGS ANTERIOR CIRCULATION: Study degraded by motion artifact. Distal cervical segments of the internal carotid arteries are patent with antegrade flow. Petrous, cavernous and supraclinoid segments patent without flow-limiting stenosis. A1 segments widely patent. Normal anterior communicating artery. Anterior cerebral arteries patent to their distal aspects without stenosis. M1 segments demonstrate probable atheromatous irregularity without high-grade flow-limiting stenosis. Normal MCA bifurcations. No proximal M2 occlusion. Distal MCA branches well perfused and symmetric. POSTERIOR CIRCULATION: Vertebral arteries patent to the vertebrobasilar junction. Left vertebral artery dominant. Posterior inferior cerebral arteries patent proximally. Basilar artery patent to its distal aspect without stenosis. Superior cerebral arteries patent proximally. Right PCA supplied via the basilar as well as a prominent right posterior communicating artery. Fetal type origin of the left PCA. PCAs patent to their distal aspects without stenosis. No intracranial aneurysm. IMPRESSION: MRI HEAD IMPRESSION: 1. Patchy small volume acute ischemic nonhemorrhagic left cerebellar infarcts. 2. Remote lacunar infarcts involving the left thalamus and bilateral cerebellar hemispheres.  3. Age-related atrophy with advanced chronic microvascular ischemic disease. MRA HEAD IMPRESSION: Negative intracranial MRA. No large vessel occlusion. No high-grade or correctable stenosis. Electronically Signed   By: Jeannine Boga M.D.   On: 05/26/2017 01:23     Not all labs, radiology exams or other studies done during hospitalization come through on my EPIC note; however they are reviewed by me.    Assessment and Plan  UPPER GI BLEED/DUODENAL ULCERS/caps acute BLOOD LOSS ANEMIA-patient received 2 units PRBCs and was started on Protonix infusion after which her hemoglobin and hematocrit remain stable. Upper endoscopy revealed short segment of Barrett's esophagus, grade B esophagitis, erythematous mucosa in the cardia gastric fundus body and atrium of stomach and multiple oozing duodenal ulcers, which were injected and clipped. She was transitioned to Protonix 40 twice a day SNF - patient will generalized weakness for OT/PT; will follow-up CBC  ACUTE ISCHEMIC CVA LEFT CEREBELLUM-a facial improved; MRI negative, carotid ultrasound with no stenosis; patient has declined Eliquis in the past, therefore we'll treat with ASA. SNF - admitted for OT/PT; continue ASA 81 mg by mouth daily as prophylaxis  ATRIAL FIB WITH RVR-AV block as wished that she would diltiazem and metoprolol SNF - continue rate control with metoprolol 50 mg daily, diltiazem 180 mg daily and ASA 81 mg daily as prophylaxis  CHRONIC DIASTOLIC HEART FAILURE-without exacerbation. Plan to continue Toprol-XL 50 mg by mouth daily and Lasix 20 mg by mouth daily  POLYMYALGIA RHEUMATICA SNF - controlled; continue prednisone 5 mg by mouth daily  ANXIETY SNF - per nursing patient is very anxious; will increase BuSpar to 5 mg twice a day up from 1 daily   Time spent greater than 45 minutes;> 50% of time with patient was spent reviewing records, labs, tests and studies, counseling and developing plan of care  Webb Silversmith D. Sheppard Coil,  MD

## 2017-06-05 ENCOUNTER — Telehealth (HOSPITAL_COMMUNITY): Payer: Self-pay | Admitting: *Deleted

## 2017-06-05 NOTE — Telephone Encounter (Signed)
Pt on referral list from hospital visit.  I called and spoke to the patients daughter and she advised that the pt is now in SNF, as documented for chart.  They are now coordinating her care.

## 2017-06-06 ENCOUNTER — Encounter: Payer: Self-pay | Admitting: Internal Medicine

## 2017-06-06 DIAGNOSIS — I639 Cerebral infarction, unspecified: Secondary | ICD-10-CM | POA: Insufficient documentation

## 2017-06-06 DIAGNOSIS — K264 Chronic or unspecified duodenal ulcer with hemorrhage: Secondary | ICD-10-CM | POA: Insufficient documentation

## 2017-06-06 DIAGNOSIS — F419 Anxiety disorder, unspecified: Secondary | ICD-10-CM | POA: Insufficient documentation

## 2017-06-09 ENCOUNTER — Non-Acute Institutional Stay (SKILLED_NURSING_FACILITY): Payer: Medicare Other | Admitting: Internal Medicine

## 2017-06-09 ENCOUNTER — Other Ambulatory Visit: Payer: Self-pay | Admitting: *Deleted

## 2017-06-09 DIAGNOSIS — R031 Nonspecific low blood-pressure reading: Secondary | ICD-10-CM

## 2017-06-09 NOTE — Patient Outreach (Signed)
[facility]) Care Management  06/09/2017  Krystal Hensley Jun 12, 1918 [id number]   Met with [name], SW at facility. She reports that patient is from home alone and plans to return home alone.  She has a supportive daughter.   Met with patient at bedside of facility.  Patient hard of hearing.  RNCM briefly reviewed [name] Patient requested a brochure for her daughter.   Plan to follow up as needed with facility and daughter closer to discharge.  Royetta Crochet. Laymond Purser, RN, BSN, [place] [phone]) Business Cell  [phone]) Toll Free Office

## 2017-06-19 ENCOUNTER — Other Ambulatory Visit: Payer: Self-pay | Admitting: *Deleted

## 2017-06-19 NOTE — Patient Outreach (Signed)
[facility] ([facility] Care Management  06/19/2017  Krystal Hensley 05/11/1918 [id number]   Met with [name], SW at facility. She reports patient making slow progress. Patient still has goal to go home, has supportive family but lives alone with some private pay help.   Met with patient at bedside. Patient is very emotional during visit. She states she wants to go home but feels she is going to need extra support.  She reports she has a son and daughter but they cannot help her long term due to their age and their own health issues.   Patient reports that she may talk with SW and family regarding ALF or private pay   RNCM reviewed [facility] care management services, she states she gave brochure to her daughter.  RNCM reviewed discharge plan, encouraged patient to speak with family about her options for discharge and her needs.  She agrees.  RNCM instructed patient to have her daughter call RNCM with questions   Plan to follow up as indicated for discharge planning needs.   [name] E. Niemczura, RN, BSN, CCM  Post Acute Care Coordinator [facility] ([phone]) Business Cell  [phone]) Toll Free Office 

## 2017-06-23 ENCOUNTER — Encounter: Payer: Self-pay | Admitting: Internal Medicine

## 2017-06-23 ENCOUNTER — Non-Acute Institutional Stay (SKILLED_NURSING_FACILITY): Payer: Medicare Other | Admitting: Internal Medicine

## 2017-06-23 DIAGNOSIS — J189 Pneumonia, unspecified organism: Secondary | ICD-10-CM | POA: Diagnosis not present

## 2017-06-23 DIAGNOSIS — R638 Other symptoms and signs concerning food and fluid intake: Secondary | ICD-10-CM

## 2017-06-23 DIAGNOSIS — F419 Anxiety disorder, unspecified: Secondary | ICD-10-CM

## 2017-06-23 NOTE — Progress Notes (Signed)
Location:  [facility] Room Number: 778 Place of Service:  SNF [phone])  Provider: Noah Delaine. Sheppard Coil, MD  Lajean Manes, MD  Patient Care Team: Lajean Manes, MD as PCP - General (Internal Medicine)  Extended Emergency Contact Information Primary Emergency Contact: Smith,Charlene W Address: [address]          [place] [address] Johnnette Litter of [place] Phone: [phone] Mobile Phone: [phone] Relation: Daughter Secondary Emergency Contact: Marrion Coy [place] of [place] Mobile Phone: [phone] Relation: Son    Allergies: Chocolate and Statins  Chief Complaint  Patient presents with  . Acute Visit    wheezing, cough    HPI: Patient is 83 y.o. female who nursing is asked me to see for wheezing. Patient says that she has had cough for a week. Patient denies shortness of breath or chest pain. Patient does admit to increased anxiety. Per nursing patient has had no fever, chills, nausea, vomiting diarrhea or any other systemic symptom. They do say she hasn't eaten as much in the last several days.  Past Medical History:  Diagnosis Date  . Abdominal pain, right upper quadrant 07/24/2014  . Acute on chronic diastolic CHF (congestive heart failure), NYHA class 2 ([place]) 02/15/2015  . Arthritis   . Atrial fibrillation with RVR ([place]) 06/14/2014  . Back pain   . Cerebral infarction ([place]) 08/08/2013  . Chronic diastolic CHF (congestive heart failure) ([place])   . Colon cancer ([place])   . Dyspnea 07/24/2014  . History of CVA (cerebrovascular accident) 05/25/2017  . HTN (hypertension) 08/08/2013  . Hypertension   . Hypoglycemia   . PAF (paroxysmal atrial fibrillation) ([place])   . PMR (polymyalgia rheumatica) ([place]) 08/08/2013  . Polymyalgia rheumatica ([place])   . Pulmonary hypertension ([place])    a. Echo 07/2014 - mildly increased PASP.  Marland Kitchen Right knee injury, subsequent encounter 01/14/2017  . Spastic hemiplegia affecting dominant side ([place])  08/08/2013  . Stroke ([place])   . Symptomatic anemia 05/25/2017  . Tachycardia 01/14/2017  . TIA (transient ischemic attack) 05/25/2017  . Upper GI bleed 05/25/2017    Past Surgical History:  Procedure Laterality Date  . BILATERAL OOPHORECTOMY    . CESAREAN SECTION    . COLON SURGERY    . ESOPHAGOGASTRODUODENOSCOPY (EGD) WITH PROPOFOL N/A 05/26/2017   Procedure: ESOPHAGOGASTRODUODENOSCOPY (EGD) WITH PROPOFOL;  Surgeon: [name], MD;  Location: [facility];  Service: Gastroenterology;  Laterality: N/A;  . EYE SURGERY      Allergies as of 06/23/2017      Reactions   Chocolate Other (See Comments)   Unknown   Statins Other (See Comments)   myalgia      Medication List        Accurate as of 06/23/17  4:39 PM. Always use your most recent med list.          acetaminophen 325 MG tablet Commonly known as:  TYLENOL Take 325 mg every 6 (six) hours as needed by mouth for moderate pain.   aspirin 81 MG chewable tablet Chew 81 mg by mouth daily.   busPIRone 5 MG tablet Commonly known as:  BUSPAR Take 5 mg by mouth daily.   diltiazem 180 MG 24 hr capsule Commonly known as:  CARDIZEM CD Take 1 capsule (180 mg total) by mouth daily.   feeding supplement (ENSURE ENLIVE) Liqd Take 237 mLs by mouth 2 (two) times daily between meals.   furosemide 20 MG tablet Commonly known as:  LASIX Take 20  mg by mouth daily.   metoprolol succinate 50 MG 24 hr tablet Commonly known as:  TOPROL-XL take 1 tablet by mouth once daily with food or IMMEDIATELY FOLLOWING A MEAL   pantoprazole 40 MG tablet Commonly known as:  PROTONIX Take 1 tablet (40 mg total) by mouth 2 (two) times daily.   polyethylene glycol packet Commonly known as:  MIRALAX / GLYCOLAX Take 17 g by mouth daily.   potassium chloride 20 MEQ packet Commonly known as:  KLOR-CON Take 10 mEq by mouth daily.   predniSONE 5 MG tablet Commonly known as:  DELTASONE Take 5 mg by mouth daily.   sennosides-docusate sodium  8.6-50 MG tablet Commonly known as:  SENOKOT-S Take 1 tablet by mouth daily as needed for constipation.   VISION FORMULA/LUTEIN PO Take 1 tablet by mouth 2 (two) times daily.       No orders of the defined types were placed in this encounter.   Immunization History  Administered Date(s) Administered  . Influenza,inj,Quad PF,6+ Mos 05/28/2015  . Tdap 11/01/2012    Social History   Tobacco Use  . Smoking status: Never Smoker  . Smokeless tobacco: Never Used  Substance Use Topics  . Alcohol use: No    Review of Systems  DATA OBTAINED: from patient, nurse GENERAL:  no fevers, fatigue, appetite changes SKIN: No itching, rash HEENT: No complaint RESPIRATORY: + cough,+ wheezing, no SOB CARDIAC: No chest pain, palpitations, lower extremity edema  GI: No abdominal pain, No N/V/D or constipation, No heartburn or reflux  GU: No dysuria, frequency or urgency, or incontinence  MUSCULOSKELETAL: No unrelieved bone/joint pain NEUROLOGIC: No headache, dizziness  PSYCHIATRIC: No overt anxiety or sadness  Vitals:   06/23/17 1637  BP: 110/78  Pulse: (!) 101  Resp: 18  Temp: (!) 97.2 F (36.2 C)  SpO2: 98%   Body mass index is 21.35 kg/m. Physical Exam  GENERAL APPEARANCE: Alert, conversant, No acute distress  SKIN: No diaphoresis rash HEENT: Unremarkable RESPIRATORY: Breathing is even, unlabored. Lung sounds are mild wheezing, decent airflow  CARDIOVASCULAR: Heart irreg no murmurs, rubs or gallops. + peripheral edema  GASTROINTESTINAL: Abdomen is soft, non-tender, not distended w/ normal bowel sounds.  GENITOURINARY: Bladder non tender, not distended  MUSCULOSKELETAL: No abnormal joints or musculature NEUROLOGIC: Cranial nerves 2-12 grossly intact. Moves all extremities PSYCHIATRIC: Mood and affect appropriate to situation, no behavioral issues  Patient Active Problem List   Diagnosis Date Noted  . Duodenal ulcer hemorrhage 06/06/2017  . Cerebellar stroke, acute ([place])  06/06/2017  . Anxiety 06/06/2017  . TIA (transient ischemic attack) 05/25/2017  . History of CVA (cerebrovascular accident) 05/25/2017  . Symptomatic anemia 05/25/2017  . Upper GI bleed 05/25/2017  . Right knee injury, subsequent encounter 01/14/2017  . Tachycardia 01/14/2017  . Shortness of breath 11/09/2016  . Hyponatremia 11/22/2015  . Acute on chronic diastolic CHF (congestive heart failure), NYHA class 2 ([place]) 02/15/2015  . Dyspnea 07/24/2014  . Abdominal pain, right upper quadrant 07/24/2014  . Chronic diastolic CHF (congestive heart failure) ([place]) 07/24/2014  . Atrial fibrillation with RVR ([place]) 06/14/2014  . Constipation 04/03/2014  . Pulmonary hypertension ([facility]) 02/02/2014  . Chest pain 01/14/2014  . Cerebral infarction ([facility]) 08/08/2013  . RUE weakness 08/08/2013  . HTN (hypertension) 08/08/2013  . PMR (polymyalgia rheumatica) ([facility]) 08/08/2013  . Spastic hemiplegia affecting dominant side ([place]) 08/08/2013  . Wound of left leg 08/08/2013  . Hx of PAF- CHADS VASC=7 08/08/2013    CMP  Component Value Date/Time   NA 138 05/28/2017 0153   NA 137 04/27/2017 1347   K 3.4 (L) 05/28/2017 0153   CL 109 05/28/2017 0153   CO2 24 05/28/2017 0153   GLUCOSE 93 05/28/2017 0153   BUN 16 05/28/2017 0153   BUN 21 04/27/2017 1347   CREATININE 0.65 05/28/2017 0153   CALCIUM 7.6 (L) 05/28/2017 0153   PROT 4.6 (L) 05/25/2017 2220   ALBUMIN 2.5 (L) 05/25/2017 2220   AST 31 05/25/2017 2220   ALT 19 05/25/2017 2220   ALKPHOS 70 05/25/2017 2220   BILITOT 3.0 (H) 05/25/2017 2220   GFRNONAA >60 05/28/2017 0153   GFRAA >60 05/28/2017 0153   Recent Labs    11/09/16 1200  05/25/17 0720  05/26/17 0442 05/27/17 0309 05/28/17 0153  NA 133*   < > 141   < > 139 140 138  K 4.8   < > 3.9   < > 3.9 3.5 3.4*  CL 101   < > 111   < > 111 112* 109  CO2 23   < > 21*   < > 20* 22 24  GLUCOSE 97   < > 153*   < > 101* 83 93  BUN 15   < > 33*   < > 27* 21* 16  CREATININE 0.67   < > 0.73   <  > 0.61 0.60 0.65  CALCIUM 9.0   < > 8.1*   < > 7.9* 7.8* 7.6*  MG 2.2  --  2.3  --   --   --   --   PHOS  --   --  3.0  --   --   --   --    < > = values in this interval not displayed.   Recent Labs    05/25/17 0720 05/25/17 2220  AST 30 31  ALT 19 19  ALKPHOS 73 70  BILITOT 0.5 3.0*  PROT 4.7* 4.6*  ALBUMIN 2.5* 2.5*   Recent Labs    05/25/17 0720  05/26/17 0442 05/27/17 0309 05/28/17 0153  WBC 11.5*  12.5*   < > 11.9* 9.7 9.8  NEUTROABS 9.4*  9.6*  --   --   --  7.6  HGB 6.6*  6.8*   < > 8.9* 8.6* 8.3*  HCT 20.8*  21.7*   < > 27.9* 27.1* 26.2*  MCV 104.0*  103.8*   < > 96.9 101.1* 101.6*  PLT 201  216   < > 182 175 158   < > = values in this interval not displayed.   Recent Labs    05/26/17 0442  CHOL 116  LDLCALC 57  TRIG 160*   No results found for: [facility] Lab Results  Component Value Date   TSH 1.171 11/09/2016   Lab Results  Component Value Date   HGBA1C 5.4 05/26/2017   Lab Results  Component Value Date   CHOL 116 05/26/2017   HDL 27 (L) 05/26/2017   LDLCALC 57 05/26/2017   TRIG 160 (H) 05/26/2017   CHOLHDL 4.3 05/26/2017    Significant Diagnostic Results in last 30 days:  Ct Head Wo Contrast  Result Date: 05/25/2017 CLINICAL DATA:  Altered mental status. EXAM: CT HEAD WITHOUT CONTRAST TECHNIQUE: Contiguous axial images were obtained from the base of the skull through the vertex without intravenous contrast. COMPARISON:  CT scan of December 07, 2014. FINDINGS: Brain: Mild diffuse cortical atrophy is noted. Mild chronic ischemic white matter disease is noted.  No mass effect or midline shift is noted. Ventricular size is within normal limits. There is no evidence of mass lesion, hemorrhage or acute infarction. Vascular: No hyperdense vessel or unexpected calcification. Skull: Normal. Negative for fracture or focal lesion. Sinuses/Orbits: Mild sphenoid sinusitis is noted. Other: None. IMPRESSION: Mild diffuse cortical atrophy. Mild chronic  ischemic white matter disease. No acute intracranial abnormality seen. Electronically Signed   By: Marijo Conception, M.D.   On: 05/25/2017 08:51   Mr Brain Wo Contrast  Result Date: 05/26/2017 CLINICAL DATA:  Initial evaluation for acute speech difficulty. EXAM: MRI HEAD WITHOUT CONTRAST MRA HEAD WITHOUT CONTRAST TECHNIQUE: Multiplanar, multiecho pulse sequences of the brain and surrounding structures were obtained without intravenous contrast. Angiographic images of the head were obtained using MRA technique without contrast. COMPARISON:  Prior CT from 05/25/2017. FINDINGS: MRI HEAD FINDINGS Brain: Study moderately degraded by motion artifact. Diffuse prominence of the CSF containing spaces compatible with generalized cerebral atrophy. Patchy and confluent T2/FLAIR hyperintensity within the periventricular and deep white matter both cerebral hemispheres most compatible chronic small vessel ischemic disease, fairly advanced in nature. Few scattered remote bilateral cerebellar infarcts. Remote lacunar infarct present within the left thalamus. Patchy small volume acute ischemic infarcts present within the left cerebellar hemisphere (series 4, image 9). Largest area of infarction measures 6 mm. No associated hemorrhage or mass effect. No other evidence for acute or subacute infarct. Gray-white matter differentiation otherwise maintained. No other evidence for acute or chronic intracranial hemorrhage. No other areas of chronic infarction. No mass lesion, midline shift or mass effect. No hydrocephalus. No extra-axial fluid collection. Major dural sinuses grossly patent. Pituitary gland normal.  Midline structures intact and normal. Vascular: Major intracranial vascular flow voids maintained. Skull and upper cervical spine: Degenerative thickening at the tectorial membrane with mild narrowing at the craniocervical junction. Visualized upper cervical spine grossly unremarkable. Bone marrow signal intensity within  normal limits. No scalp soft tissue abnormality. Sinuses/Orbits: Globes and orbital soft tissues within normal limits. Patient status post cataract extraction bilaterally. Paranasal sinuses clear. No mastoid effusion. Inner ear structures grossly normal. Other: None. MRA HEAD FINDINGS ANTERIOR CIRCULATION: Study degraded by motion artifact. Distal cervical segments of the internal carotid arteries are patent with antegrade flow. Petrous, cavernous and supraclinoid segments patent without flow-limiting stenosis. A1 segments widely patent. Normal anterior communicating artery. Anterior cerebral arteries patent to their distal aspects without stenosis. M1 segments demonstrate probable atheromatous irregularity without high-grade flow-limiting stenosis. Normal MCA bifurcations. No proximal M2 occlusion. Distal MCA branches well perfused and symmetric. POSTERIOR CIRCULATION: Vertebral arteries patent to the vertebrobasilar junction. Left vertebral artery dominant. Posterior inferior cerebral arteries patent proximally. Basilar artery patent to its distal aspect without stenosis. Superior cerebral arteries patent proximally. Right PCA supplied via the basilar as well as a prominent right posterior communicating artery. Fetal type origin of the left PCA. PCAs patent to their distal aspects without stenosis. No intracranial aneurysm. IMPRESSION: MRI HEAD IMPRESSION: 1. Patchy small volume acute ischemic nonhemorrhagic left cerebellar infarcts. 2. Remote lacunar infarcts involving the left thalamus and bilateral cerebellar hemispheres. 3. Age-related atrophy with advanced chronic microvascular ischemic disease. MRA HEAD IMPRESSION: Negative intracranial MRA. No large vessel occlusion. No high-grade or correctable stenosis. Electronically Signed   By: Jeannine Boga M.D.   On: 05/26/2017 01:23   Dg Chest Port 1 View  Result Date: 05/25/2017 CLINICAL DATA:  Atrial fibrillation EXAM: PORTABLE CHEST 1 VIEW COMPARISON:   11/10/2016 FINDINGS: Cardiomegaly with vascular congestion. Left lower lobe atelectasis  or infiltrate. Right lung is clear. No acute bony abnormality. Advanced degenerative changes in the shoulders. IMPRESSION: Cardiomegaly with vascular congestion. Left lower lobe atelectasis or infiltrate. This is similar to prior study. Electronically Signed   By: Rolm Baptise M.D.   On: 05/25/2017 09:13   Mr Virgel Paling NI Contrast  Result Date: 05/26/2017 CLINICAL DATA:  Initial evaluation for acute speech difficulty. EXAM: MRI HEAD WITHOUT CONTRAST MRA HEAD WITHOUT CONTRAST TECHNIQUE: Multiplanar, multiecho pulse sequences of the brain and surrounding structures were obtained without intravenous contrast. Angiographic images of the head were obtained using MRA technique without contrast. COMPARISON:  Prior CT from 05/25/2017. FINDINGS: MRI HEAD FINDINGS Brain: Study moderately degraded by motion artifact. Diffuse prominence of the CSF containing spaces compatible with generalized cerebral atrophy. Patchy and confluent T2/FLAIR hyperintensity within the periventricular and deep white matter both cerebral hemispheres most compatible chronic small vessel ischemic disease, fairly advanced in nature. Few scattered remote bilateral cerebellar infarcts. Remote lacunar infarct present within the left thalamus. Patchy small volume acute ischemic infarcts present within the left cerebellar hemisphere (series 4, image 9). Largest area of infarction measures 6 mm. No associated hemorrhage or mass effect. No other evidence for acute or subacute infarct. Gray-white matter differentiation otherwise maintained. No other evidence for acute or chronic intracranial hemorrhage. No other areas of chronic infarction. No mass lesion, midline shift or mass effect. No hydrocephalus. No extra-axial fluid collection. Major dural sinuses grossly patent. Pituitary gland normal.  Midline structures intact and normal. Vascular: Major intracranial  vascular flow voids maintained. Skull and upper cervical spine: Degenerative thickening at the tectorial membrane with mild narrowing at the craniocervical junction. Visualized upper cervical spine grossly unremarkable. Bone marrow signal intensity within normal limits. No scalp soft tissue abnormality. Sinuses/Orbits: Globes and orbital soft tissues within normal limits. Patient status post cataract extraction bilaterally. Paranasal sinuses clear. No mastoid effusion. Inner ear structures grossly normal. Other: None. MRA HEAD FINDINGS ANTERIOR CIRCULATION: Study degraded by motion artifact. Distal cervical segments of the internal carotid arteries are patent with antegrade flow. Petrous, cavernous and supraclinoid segments patent without flow-limiting stenosis. A1 segments widely patent. Normal anterior communicating artery. Anterior cerebral arteries patent to their distal aspects without stenosis. M1 segments demonstrate probable atheromatous irregularity without high-grade flow-limiting stenosis. Normal MCA bifurcations. No proximal M2 occlusion. Distal MCA branches well perfused and symmetric. POSTERIOR CIRCULATION: Vertebral arteries patent to the vertebrobasilar junction. Left vertebral artery dominant. Posterior inferior cerebral arteries patent proximally. Basilar artery patent to its distal aspect without stenosis. Superior cerebral arteries patent proximally. Right PCA supplied via the basilar as well as a prominent right posterior communicating artery. Fetal type origin of the left PCA. PCAs patent to their distal aspects without stenosis. No intracranial aneurysm. IMPRESSION: MRI HEAD IMPRESSION: 1. Patchy small volume acute ischemic nonhemorrhagic left cerebellar infarcts. 2. Remote lacunar infarcts involving the left thalamus and bilateral cerebellar hemispheres. 3. Age-related atrophy with advanced chronic microvascular ischemic disease. MRA HEAD IMPRESSION: Negative intracranial MRA. No large vessel  occlusion. No high-grade or correctable stenosis. Electronically Signed   By: Jeannine Boga M.D.   On: 05/26/2017 01:23    Assessment and Plan  COUGH/WHEEZING-have ordered chest x-ray PN lateral, and have scheduled DuoNeb 3 times a day 7 days; at her age highly likely patient has a pneumonia  DECREASED BY mouth INTAKE-will start Ensure 3 times a day with meals  ANXIETY-will DC BuSpar and start Xanax 0.25 mg twice a day   Later entry-chest x-ray is positive for  pneumonia, will start Levaquin 750 mg by mouth daily for 7 days     Noah Delaine. Sheppard Coil, MD

## 2017-06-27 ENCOUNTER — Emergency Department (HOSPITAL_COMMUNITY): Payer: Medicare Other

## 2017-06-27 ENCOUNTER — Inpatient Hospital Stay (HOSPITAL_COMMUNITY)
Admission: EM | Admit: 2017-06-27 | Discharge: 2017-07-07 | DRG: 871 | Disposition: E | Payer: Medicare Other | Attending: Internal Medicine | Admitting: Internal Medicine

## 2017-06-27 ENCOUNTER — Encounter (HOSPITAL_COMMUNITY): Payer: Self-pay | Admitting: Emergency Medicine

## 2017-06-27 DIAGNOSIS — R739 Hyperglycemia, unspecified: Secondary | ICD-10-CM | POA: Diagnosis present

## 2017-06-27 DIAGNOSIS — Z8249 Family history of ischemic heart disease and other diseases of the circulatory system: Secondary | ICD-10-CM | POA: Diagnosis not present

## 2017-06-27 DIAGNOSIS — R4701 Aphasia: Secondary | ICD-10-CM | POA: Diagnosis present

## 2017-06-27 DIAGNOSIS — I272 Pulmonary hypertension, unspecified: Secondary | ICD-10-CM | POA: Diagnosis present

## 2017-06-27 DIAGNOSIS — Y95 Nosocomial condition: Secondary | ICD-10-CM | POA: Diagnosis present

## 2017-06-27 DIAGNOSIS — Z90710 Acquired absence of both cervix and uterus: Secondary | ICD-10-CM | POA: Diagnosis not present

## 2017-06-27 DIAGNOSIS — I509 Heart failure, unspecified: Secondary | ICD-10-CM

## 2017-06-27 DIAGNOSIS — A419 Sepsis, unspecified organism: Principal | ICD-10-CM | POA: Diagnosis present

## 2017-06-27 DIAGNOSIS — Z515 Encounter for palliative care: Secondary | ICD-10-CM | POA: Diagnosis present

## 2017-06-27 DIAGNOSIS — G9349 Other encephalopathy: Secondary | ICD-10-CM | POA: Diagnosis present

## 2017-06-27 DIAGNOSIS — R6520 Severe sepsis without septic shock: Secondary | ICD-10-CM | POA: Diagnosis present

## 2017-06-27 DIAGNOSIS — D649 Anemia, unspecified: Secondary | ICD-10-CM | POA: Diagnosis present

## 2017-06-27 DIAGNOSIS — F419 Anxiety disorder, unspecified: Secondary | ICD-10-CM | POA: Diagnosis present

## 2017-06-27 DIAGNOSIS — Z8673 Personal history of transient ischemic attack (TIA), and cerebral infarction without residual deficits: Secondary | ICD-10-CM

## 2017-06-27 DIAGNOSIS — Z7982 Long term (current) use of aspirin: Secondary | ICD-10-CM | POA: Diagnosis not present

## 2017-06-27 DIAGNOSIS — R778 Other specified abnormalities of plasma proteins: Secondary | ICD-10-CM

## 2017-06-27 DIAGNOSIS — R7989 Other specified abnormal findings of blood chemistry: Secondary | ICD-10-CM

## 2017-06-27 DIAGNOSIS — Z7952 Long term (current) use of systemic steroids: Secondary | ICD-10-CM | POA: Diagnosis not present

## 2017-06-27 DIAGNOSIS — K5900 Constipation, unspecified: Secondary | ICD-10-CM | POA: Diagnosis present

## 2017-06-27 DIAGNOSIS — R0902 Hypoxemia: Secondary | ICD-10-CM | POA: Diagnosis present

## 2017-06-27 DIAGNOSIS — I110 Hypertensive heart disease with heart failure: Secondary | ICD-10-CM | POA: Diagnosis present

## 2017-06-27 DIAGNOSIS — M353 Polymyalgia rheumatica: Secondary | ICD-10-CM | POA: Diagnosis present

## 2017-06-27 DIAGNOSIS — Z66 Do not resuscitate: Secondary | ICD-10-CM | POA: Diagnosis present

## 2017-06-27 DIAGNOSIS — R4182 Altered mental status, unspecified: Secondary | ICD-10-CM | POA: Diagnosis present

## 2017-06-27 DIAGNOSIS — L8990 Pressure ulcer of unspecified site, unspecified stage: Secondary | ICD-10-CM

## 2017-06-27 DIAGNOSIS — R339 Retention of urine, unspecified: Secondary | ICD-10-CM | POA: Diagnosis present

## 2017-06-27 DIAGNOSIS — I480 Paroxysmal atrial fibrillation: Secondary | ICD-10-CM | POA: Diagnosis present

## 2017-06-27 DIAGNOSIS — I5032 Chronic diastolic (congestive) heart failure: Secondary | ICD-10-CM | POA: Diagnosis present

## 2017-06-27 DIAGNOSIS — Z85038 Personal history of other malignant neoplasm of large intestine: Secondary | ICD-10-CM | POA: Diagnosis not present

## 2017-06-27 DIAGNOSIS — J189 Pneumonia, unspecified organism: Secondary | ICD-10-CM | POA: Diagnosis present

## 2017-06-27 LAB — COMPREHENSIVE METABOLIC PANEL
ALBUMIN: 3.1 g/dL — AB (ref 3.5–5.0)
ALT: 153 U/L — ABNORMAL HIGH (ref 14–54)
ANION GAP: 9 (ref 5–15)
AST: 184 U/L — ABNORMAL HIGH (ref 15–41)
Alkaline Phosphatase: 103 U/L (ref 38–126)
BUN: 35 mg/dL — ABNORMAL HIGH (ref 6–20)
CO2: 27 mmol/L (ref 22–32)
Calcium: 8.7 mg/dL — ABNORMAL LOW (ref 8.9–10.3)
Chloride: 101 mmol/L (ref 101–111)
Creatinine, Ser: 0.86 mg/dL (ref 0.44–1.00)
GFR calc Af Amer: >60 mL/min (ref >60)
GFR, EST NON AFRICAN AMERICAN: 55 mL/min — AB (ref >60)
Glucose, Bld: 149 mg/dL — ABNORMAL HIGH (ref 65–99)
POTASSIUM: 4.9 mmol/L (ref 3.5–5.1)
Sodium: 137 mmol/L (ref 135–145)
Total Bilirubin: 1 mg/dL (ref 0.3–1.2)
Total Protein: 5.8 g/dL — ABNORMAL LOW (ref 6.5–8.1)

## 2017-06-27 LAB — APTT: aPTT: 34 seconds (ref 24–36)

## 2017-06-27 LAB — CBC WITH DIFFERENTIAL/PLATELET
BASOS PCT: 0 %
Basophils Absolute: 0 10*3/uL (ref 0.0–0.1)
Eosinophils Absolute: 0.1 10*3/uL (ref 0.0–0.7)
Eosinophils Relative: 0 %
HEMATOCRIT: 27.7 % — AB (ref 36.0–46.0)
Hemoglobin: 8 g/dL — ABNORMAL LOW (ref 12.0–15.0)
Lymphocytes Relative: 5 %
Lymphs Abs: 0.6 10*3/uL — ABNORMAL LOW (ref 0.7–4.0)
MCH: 23.9 pg — ABNORMAL LOW (ref 26.0–34.0)
MCHC: 28.9 g/dL — AB (ref 30.0–36.0)
MCV: 82.7 fL (ref 78.0–100.0)
MONO ABS: 1.6 10*3/uL — AB (ref 0.1–1.0)
MONOS PCT: 12 %
NEUTROS ABS: 11.1 10*3/uL — AB (ref 1.7–7.7)
Neutrophils Relative %: 83 %
Platelets: 219 10*3/uL (ref 150–400)
RBC: 3.35 MIL/uL — ABNORMAL LOW (ref 3.87–5.11)
RDW: 24.2 % — AB (ref 11.5–15.5)
WBC: 13.4 10*3/uL — ABNORMAL HIGH (ref 4.0–10.5)

## 2017-06-27 LAB — URINALYSIS, ROUTINE W REFLEX MICROSCOPIC
BILIRUBIN URINE: NEGATIVE
Bacteria, UA: NONE SEEN
Glucose, UA: NEGATIVE mg/dL
HGB URINE DIPSTICK: NEGATIVE
Ketones, ur: NEGATIVE mg/dL
LEUKOCYTES UA: NEGATIVE
Nitrite: NEGATIVE
PH: 5 (ref 5.0–8.0)
Protein, ur: 30 mg/dL — AB
SPECIFIC GRAVITY, URINE: 1.015 (ref 1.005–1.030)

## 2017-06-27 LAB — LACTIC ACID, PLASMA: Lactic Acid, Venous: 2.4 mmol/L (ref 0.5–1.9)

## 2017-06-27 LAB — PROTIME-INR
INR: 1.67
Prothrombin Time: 19.5 seconds — ABNORMAL HIGH (ref 11.4–15.2)

## 2017-06-27 LAB — I-STAT CG4 LACTIC ACID, ED: LACTIC ACID, VENOUS: 3.23 mmol/L — AB (ref 0.5–1.9)

## 2017-06-27 LAB — CORTISOL: CORTISOL PLASMA: 16.2 ug/dL

## 2017-06-27 LAB — PROCALCITONIN

## 2017-06-27 LAB — BRAIN NATRIURETIC PEPTIDE: B Natriuretic Peptide: 1313.4 pg/mL — ABNORMAL HIGH (ref 0.0–100.0)

## 2017-06-27 LAB — TROPONIN I: TROPONIN I: 0.72 ng/mL — AB (ref <0.03)

## 2017-06-27 MED ORDER — SODIUM CHLORIDE 0.9 % IV BOLUS (SEPSIS)
500.0000 mL | Freq: Once | INTRAVENOUS | Status: AC
  Administered 2017-06-27: 500 mL via INTRAVENOUS

## 2017-06-27 MED ORDER — GLYCOPYRROLATE 1 MG PO TABS: 1.0000 mg | ORAL_TABLET | ORAL | Status: DC | PRN

## 2017-06-27 MED ORDER — IPRATROPIUM-ALBUTEROL 0.5-2.5 (3) MG/3ML IN SOLN
3.0000 mL | Freq: Four times a day (QID) | RESPIRATORY_TRACT | Status: DC
  Filled 2017-06-27: qty 3

## 2017-06-27 MED ORDER — MORPHINE SULFATE (PF) 4 MG/ML IV SOLN
1.0000 mg | INTRAVENOUS | Status: DC | PRN
  Administered 2017-06-27 – 2017-06-28 (×3): 1 mg via INTRAVENOUS
  Filled 2017-06-27 (×3): qty 1

## 2017-06-27 MED ORDER — BIOTENE DRY MOUTH MT LIQD: 15.0000 mL | OROMUCOSAL | Status: DC | PRN

## 2017-06-27 MED ORDER — VANCOMYCIN HCL IN DEXTROSE 1-5 GM/200ML-% IV SOLN
1000.0000 mg | Freq: Once | INTRAVENOUS | Status: AC
  Administered 2017-06-27: 1000 mg via INTRAVENOUS
  Filled 2017-06-27: qty 200

## 2017-06-27 MED ORDER — ACETAMINOPHEN 325 MG PO TABS
650.0000 mg | ORAL_TABLET | Freq: Four times a day (QID) | ORAL | Status: DC | PRN
Start: 2017-06-27 — End: 2017-06-28

## 2017-06-27 MED ORDER — SODIUM CHLORIDE 0.9 % IV SOLN: INTRAVENOUS | Status: DC

## 2017-06-27 MED ORDER — GLYCOPYRROLATE 0.2 MG/ML IJ SOLN: 0.2000 mg | INTRAMUSCULAR | Status: DC | PRN

## 2017-06-27 MED ORDER — POLYVINYL ALCOHOL 1.4 % OP SOLN: 1.0000 [drp] | Freq: Four times a day (QID) | OPHTHALMIC | Status: DC | PRN

## 2017-06-27 MED ORDER — IOPAMIDOL (ISOVUE-300) INJECTION 61%
INTRAVENOUS | Status: AC
  Administered 2017-06-27: 100 mL via INTRAVENOUS
  Filled 2017-06-27: qty 100

## 2017-06-27 MED ORDER — HALOPERIDOL 1 MG PO TABS: 0.5000 mg | ORAL_TABLET | ORAL | Status: DC | PRN

## 2017-06-27 MED ORDER — HALOPERIDOL LACTATE 5 MG/ML IJ SOLN
0.5000 mg | INTRAMUSCULAR | Status: DC | PRN
  Administered 2017-06-27 – 2017-06-28 (×2): 0.5 mg via INTRAVENOUS
  Filled 2017-06-27 (×2): qty 1

## 2017-06-27 MED ORDER — ONDANSETRON HCL 4 MG/2ML IJ SOLN: 4.0000 mg | Freq: Four times a day (QID) | INTRAMUSCULAR | Status: DC | PRN

## 2017-06-27 MED ORDER — ACETAMINOPHEN 650 MG RE SUPP: 650.0000 mg | Freq: Four times a day (QID) | RECTAL | Status: DC | PRN

## 2017-06-27 MED ORDER — IPRATROPIUM-ALBUTEROL 0.5-2.5 (3) MG/3ML IN SOLN
3.0000 mL | Freq: Four times a day (QID) | RESPIRATORY_TRACT | Status: DC | PRN
  Filled 2017-06-27: qty 3

## 2017-06-27 MED ORDER — LORAZEPAM 2 MG/ML IJ SOLN
1.0000 mg | INTRAMUSCULAR | Status: DC | PRN
  Administered 2017-06-27: 1 mg via INTRAVENOUS
  Filled 2017-06-27: qty 1

## 2017-06-27 MED ORDER — HALOPERIDOL LACTATE 2 MG/ML PO CONC: 0.5000 mg | ORAL | Status: DC | PRN

## 2017-06-27 MED ORDER — ONDANSETRON 4 MG PO TBDP: 4.0000 mg | ORAL_TABLET | Freq: Four times a day (QID) | ORAL | Status: DC | PRN

## 2017-06-27 MED ORDER — VANCOMYCIN HCL 500 MG IV SOLR: 500.0000 mg | INTRAVENOUS | Status: DC

## 2017-06-27 MED ORDER — DEXTROSE 5 % IV SOLN
1.0000 g | Freq: Once | INTRAVENOUS | Status: AC
  Administered 2017-06-27: 1 g via INTRAVENOUS
  Filled 2017-06-27: qty 1

## 2017-06-27 NOTE — Progress Notes (Addendum)
Addnedum: SCr 0.86, estimated CrCl ~ 28 ml/min. Wt updated to 49 kg.   Plan: Continue Vancomycin 500 mg po every 24 hours - next dose 1500 on 12/23.  Monitor renal function, culture results, and clinical status.   Sloan Leiter, PharmD, BCPS, BCCCP Clinical Pharmacist Clinical phone 06/20/2017 until 3:30PM [phone] After hours, please call [phone]  Pharmacy Antibiotic Note  Krystal Hensley is a 83 y.o. female admitted on 06/22/2017 from [facility] with pneumonia.  Pharmacy has been consulted for Vancomycin dosing. She is hypothermic and has an elevated lactic acid of 3.23.   Received Cefepime 1g IV x1 on 06/16/2017.   Plan: Vancomycin 1g IV x1 now. Follow-up lab work for maintenance dosing.      Temp (24hrs), Avg:94.6 F (34.8 C), Min:94.6 F (34.8 C), Max:94.6 F (34.8 C)  Recent Labs  Lab 06/16/2017 1340  LATICACIDVEN 3.23*    CrCl cannot be calculated (Patient's most recent lab result is older than the maximum 21 days allowed.).    Allergies  Allergen Reactions  . Chocolate Other (See Comments)    Unknown  . Statins Other (See Comments)    myalgia    Antimicrobials this admission: Vancomycin 12/22 >> Cefepime 12/22 >>  Dose adjustments this admission:   Microbiology results: 12/22 BCx:    Thank you for allowing pharmacy to be a part of this patient's care.  Sloan Leiter, PharmD, BCPS, BCCCP Clinical Pharmacist Clinical phone 06/26/2017 until 3:30PM774 [phone] After hours, please call [phone] 06/30/2017 2:08 PM

## 2017-06-27 NOTE — ED Notes (Signed)
CODE SEPSIS ACTIVATED RN CHRIS AWARE

## 2017-06-27 NOTE — ED Provider Notes (Signed)
[facility] EMERGENCY DEPARTMENT Provider Note   CSN: [id number] Arrival date & time: 07/04/2017  1218     History   Chief Complaint Chief Complaint  Patient presents with  . Altered Mental Status    HPI Krystal Hensley is a 83 y.o. female.  HPI Patient presents with some confusion.  Comes from nursing home.  Had been diagnosed with pneumonia 4 days ago and started on Levaquin.  Since then has had decreased oral intake.  Reportedly had confusion.  Increased swelling in her legs.  Patient is complaining of lower abdominal pain.  Has a previous history of bleeding ulcers.  Has history of A. fib but is only on aspirin for anticoagulation.  No reported fevers.  No reported dysuria.  Somewhat difficult to get history from patient since she does not have her hearing aids. Past Medical History:  Diagnosis Date  . Abdominal pain, right upper quadrant 07/24/2014  . Acute on chronic diastolic CHF (congestive heart failure), NYHA class 2 ([place]) 02/15/2015  . Arthritis   . Atrial fibrillation with RVR ([place]) 06/14/2014  . Back pain   . Cerebral infarction ([place]) 08/08/2013  . Chronic diastolic CHF (congestive heart failure) ([place])   . Colon cancer ([place])   . Dyspnea 07/24/2014  . History of CVA (cerebrovascular accident) 05/25/2017  . HTN (hypertension) 08/08/2013  . Hypertension   . Hypoglycemia   . PAF (paroxysmal atrial fibrillation) ([place])   . PMR (polymyalgia rheumatica) ([place]) 08/08/2013  . Polymyalgia rheumatica ([place])   . Pulmonary hypertension ([place])    a. Echo 07/2014 - mildly increased PASP.  Marland Kitchen Right knee injury, subsequent encounter 01/14/2017  . Spastic hemiplegia affecting dominant side ([place]) 08/08/2013  . Stroke ([place])   . Symptomatic anemia 05/25/2017  . Tachycardia 01/14/2017  . TIA (transient ischemic attack) 05/25/2017  . Upper GI bleed 05/25/2017    Patient Active Problem List   Diagnosis Date Noted  . Duodenal ulcer hemorrhage 06/06/2017  . Cerebellar stroke,  acute ([place]) 06/06/2017  . Anxiety 06/06/2017  . TIA (transient ischemic attack) 05/25/2017  . History of CVA (cerebrovascular accident) 05/25/2017  . Symptomatic anemia 05/25/2017  . Upper GI bleed 05/25/2017  . Right knee injury, subsequent encounter 01/14/2017  . Tachycardia 01/14/2017  . Shortness of breath 11/09/2016  . Hyponatremia 11/22/2015  . Acute on chronic diastolic CHF (congestive heart failure), NYHA class 2 ([place]) 02/15/2015  . Dyspnea 07/24/2014  . Abdominal pain, right upper quadrant 07/24/2014  . Chronic diastolic CHF (congestive heart failure) ([place]) 07/24/2014  . Atrial fibrillation with RVR ([place]) 06/14/2014  . Constipation 04/03/2014  . Pulmonary hypertension ([place]) 02/02/2014  . Chest pain 01/14/2014  . Cerebral infarction ([place]) 08/08/2013  . RUE weakness 08/08/2013  . HTN (hypertension) 08/08/2013  . PMR (polymyalgia rheumatica) ([facility]) 08/08/2013  . Spastic hemiplegia affecting dominant side ([name]) 08/08/2013  . Wound of left leg 08/08/2013  . Hx of PAF- CHADS VASC=7 08/08/2013    Past Surgical History:  Procedure Laterality Date  . BILATERAL OOPHORECTOMY    . CESAREAN SECTION    . COLON SURGERY    . ESOPHAGOGASTRODUODENOSCOPY (EGD) WITH PROPOFOL N/A 05/26/2017   Procedure: ESOPHAGOGASTRODUODENOSCOPY (EGD) WITH PROPOFOL;  Surgeon: [name], MD;  Location: [facility];  Service: Gastroenterology;  Laterality: N/A;  . EYE SURGERY      OB History    No data available       Home Medications    Prior to Admission medications   Medication Sig Start  Date End Date Taking? Authorizing Provider  ALPRAZolam (XANAX) 0.25 MG tablet Take 0.25 mg by mouth 2 (two) times daily as needed for anxiety.   Yes [provider]  busPIRone (BUSPAR) 10 MG tablet Take 10 mg by mouth 3 (three) times daily.   Yes [provider]  diltiazem (CARDIZEM CD) 180 MG 24 hr capsule Take 1 capsule (180 mg total) by mouth daily. 11/12/16  Yes Thurnell Lose, MD    feeding supplement, ENSURE ENLIVE, (ENSURE ENLIVE) LIQD Take 237 mLs by mouth 2 (two) times daily between meals. 05/29/17  Yes Arrien, Jimmy Picket, MD  ipratropium-albuterol (DUONEB) 0.5-2.5 (3) MG/3ML SOLN Take 3 mLs by nebulization every 6 (six) hours as needed.   Yes [provider]  potassium chloride SA (K-DUR,KLOR-CON) 20 MEQ tablet Take 40 mEq by mouth 2 (two) times daily.   Yes [provider]  sennosides-docusate sodium (SENOKOT-S) 8.6-50 MG tablet Take 1 tablet by mouth daily as needed for constipation.    Yes [provider]  acetaminophen (TYLENOL) 325 MG tablet Take 325 mg every 6 (six) hours as needed by mouth for moderate pain.     [provider]  aspirin 81 MG chewable tablet Chew 81 mg by mouth daily.    [provider]  furosemide (LASIX) 20 MG tablet Take 20 mg by mouth daily.    [provider]  metoprolol succinate (TOPROL-XL) 50 MG 24 hr tablet take 1 tablet by mouth once daily with food or IMMEDIATELY FOLLOWING A MEAL Patient not taking: Reported on 06/10/2017 03/19/17   Dorothy Spark, MD  Multiple Vitamins-Minerals (VISION FORMULA/LUTEIN PO) Take 1 tablet by mouth 2 (two) times daily.     [provider]  pantoprazole (PROTONIX) 40 MG tablet Take 1 tablet (40 mg total) by mouth 2 (two) times daily. 05/29/17 07-07-2017  Arrien, Jimmy Picket, MD  polyethylene glycol [facility] / Floria Raveling) packet Take 17 g by mouth daily.    [provider]  predniSONE (DELTASONE) 5 MG tablet Take 5 mg by mouth daily.  04/15/17   [provider]    Family History Family History  Problem Relation Age of Onset  . Aneurysm Mother   . Heart attack Father     Social History Social History   Tobacco Use  . Smoking status: Never Smoker  . Smokeless tobacco: Never Used  Substance Use Topics  . Alcohol use: No  . Drug use: No     Allergies   Chocolate and Statins   Review of Systems Review of  Systems  Constitutional: Positive for appetite change. Negative for fever.  HENT: Negative for congestion.   Respiratory: Positive for cough. Negative for shortness of breath.   Cardiovascular: Positive for leg swelling.  Gastrointestinal: Positive for abdominal pain and nausea. Negative for vomiting.  Genitourinary: Negative for flank pain.  Musculoskeletal: Positive for back pain.  Skin: Negative for rash.  Neurological: Negative for syncope.  Psychiatric/Behavioral: Negative for confusion.     Physical Exam Updated Vital Signs BP 98/67   Pulse (!) 106   Temp (!) 94.5 F (34.7 C) (Rectal)   Resp (!) 34   Ht 5\' 1"  (1.549 m)   Wt 49 kg (108 lb)   SpO2 (!) 89%   BMI 20.41 kg/m   Physical Exam  Constitutional: She appears well-developed.  HENT:  Mouth is very dry.  Also areas of dried brown that potentially could be blood.  Eyes: Pupils are equal, round, and reactive to  light.  Neck: Neck supple.  Cardiovascular:  Irregular rhythm but no tachycardia  Pulmonary/Chest:  Mildly harsh breath sounds.  Abdominal: There is tenderness.  Moderate tenderness to right lower quadrant.  Some fullness with no hernia palpated  Musculoskeletal: She exhibits edema.  Pitting edema bilateral lower extremities.  Neurological: She is alert.  Awake and appropriate but difficult to get history due to not having her hearing aids.  Skin: Skin is warm. Capillary refill takes less than 2 seconds.     ED Treatments / Results  Labs (all labs ordered are listed, but only abnormal results are displayed) Labs Reviewed  CBC WITH DIFFERENTIAL/PLATELET - Abnormal; Notable for the following components:      Result Value   WBC 13.4 (*)    RBC 3.35 (*)    Hemoglobin 8.0 (*)    HCT 27.7 (*)    MCH 23.9 (*)    MCHC 28.9 (*)    RDW 24.2 (*)    Neutro Abs 11.1 (*)    Lymphs Abs 0.6 (*)    Monocytes Absolute 1.6 (*)    All other components within normal limits  COMPREHENSIVE METABOLIC PANEL -  Abnormal; Notable for the following components:   Glucose, Bld 149 (*)    BUN 35 (*)    Calcium 8.7 (*)    Total Protein 5.8 (*)    Albumin 3.1 (*)    AST 184 (*)    ALT 153 (*)    GFR calc non Af Amer 55 (*)    All other components within normal limits  URINALYSIS, ROUTINE W REFLEX MICROSCOPIC - Abnormal; Notable for the following components:   APPearance HAZY (*)    Protein, ur 30 (*)    Squamous Epithelial / LPF 0-5 (*)    All other components within normal limits  TROPONIN I - Abnormal; Notable for the following components:   Troponin I 0.72 (*)    All other components within normal limits  BRAIN NATRIURETIC PEPTIDE - Abnormal; Notable for the following components:   B Natriuretic Peptide 1,313.4 (*)    All other components within normal limits  I-STAT CG4 LACTIC ACID, ED - Abnormal; Notable for the following components:   Lactic Acid, Venous 3.23 (*)    All other components within normal limits  CULTURE, BLOOD (ROUTINE X 2)  CULTURE, BLOOD (ROUTINE X 2)  LACTIC ACID, PLASMA    EKG  EKG Interpretation  Date/Time:  Saturday June 27 2017 13:16:37 EST Ventricular Rate:  110 PR Interval:    QRS Duration: 107 QT Interval:  352 QTC Calculation: 477 R Axis:   124 Text Interpretation:  Atrial fibrillation Anterior infarct, old Abnormal T, consider ischemia, lateral leads Confirmed by Davonna Belling [phone]) on 07/01/2017 1:55:16 PM       Radiology Dg Abdomen Acute W/chest  Result Date: 06/18/2017 CLINICAL DATA:  Altered mental status since 06/25/2017. The patient was diagnosed with pneumonia 06/24/2017. EXAM: DG ABDOMEN ACUTE W/ 1V CHEST COMPARISON:  Single-view of the chest 05/25/2017 and PA and lateral chest 11/10/2016. FINDINGS: Single-view of the chest demonstrates cardiomegaly. Lung volumes are low. Airspace disease is seen in the right upper lobe and in the lung bases bilaterally. Extensive atherosclerosis is present. Convex right thoracic scoliosis noted. Two  views of the abdomen show no free intraperitoneal air. The bowel gas pattern is nonobstructive. Extensive atherosclerosis is identified. There is severe convex left scoliosis. IMPRESSION: Bibasilar and right upper lobe airspace disease could be due to pneumonia and/or asymmetric pulmonary  edema in this patient with marked cardiomegaly and small bilateral pleural effusions. No acute finding in the abdomen. Extensive atherosclerosis. Scoliosis. Electronically Signed   By: Inge Rise M.D.   On: 07/02/2017 13:55    Procedures Procedures (including critical care time)  Medications Ordered in ED Medications  vancomycin (VANCOCIN) IVPB 1000 mg/200 mL premix (1,000 mg Intravenous New Bag/Given 06/26/2017 1452)  vancomycin (VANCOCIN) 500 mg in sodium chloride 0.9 % 100 mL IVPB (not administered)  sodium chloride 0.9 % bolus 500 mL (0 mLs Intravenous Stopped 07/05/2017 1507)  ceFEPIme (MAXIPIME) 1 g in dextrose 5 % 50 mL IVPB (1 g Intravenous New Bag/Given 07/01/2017 1447)  iopamidol (ISOVUE-300) 61 % injection (100 mLs Intravenous Contrast Given 06/06/2017 1535)     Initial Impression / Assessment and Plan / ED Course  I have reviewed the triage vital signs and the nursing notes.  Pertinent labs & imaging results that were available during my care of the patient were reviewed by me and considered in my medical decision making (see chart for details).     Patient presents with shortness of breath and altered mental status.  Currently on Levaquin for pneumonia.  Has had that for the last couple days.  Hypothermic here.  Blood pressure has been moderate but not hypotensive.  Lactic acid elevated but not above 4.  Has mildly elevated troponin and appears to be in CHF but also potentially has pneumonias.  30/kg fluid bolus not given due to CHF and not in septic shock.  May actually need some diuresis.  Discussed with patient's daughter and patient is a DNR and it sounds as if patient would not want to be  intubated either.  Antibiotics empirically given to cover healthcare associated pneumonia.  Urine did not show infection but has suprapubic tenderness and fullness and on CT scan may have urinary retention.  Will give Foley catheter to both monitor ins and outputs and further attention.  Will admit to hospitalist.  Final Clinical Impressions(s) / ED Diagnoses   Final diagnoses:  HCAP (healthcare-associated pneumonia)  Congestive heart failure, unspecified HF chronicity, unspecified heart failure type ([place])  Elevated troponin  Urinary retention    ED Discharge Orders    None       Davonna Belling, MD 06/22/2017 1551

## 2017-06-27 NOTE — ED Notes (Signed)
I Stat Lactic Acid results shown to Dr. Pickering 

## 2017-06-27 NOTE — H&P (Signed)
History and Physical    Krystal Hensley ZOX:[id number] DOB: 10-05-1917 DOA: 06/20/2017   PCP: Lajean Manes, MD   Attending physician: Evangeline Gula  Patient coming from/Resides with: [facility]  Chief Complaint: Altered mental status  HPI: Krystal Hensley is a 83 y.o. female with medical history significant for hypertension, arthritis with back pain, remote history of colon cancer, polymyalgia rheumatica on chronic steroids and history of stroke.  She was recently hospitalized at this facility from 11/19-11/23.  She presented at that time with dark tarry stools that were heme positive.  In the ER she was found to have atrial fibrillation with RVR.  She also presented with expressive aphasia.  For the bleeding she was transfused 2 units of packed red blood cells.  EGD revealed Barrett's esophagitis (grade B), and erythematous mucosa of the cardia, gastric fundus, body and antrum.  She was also found to have multiple oozing duodenal ulcers which were injected and clips were placed.  In regards to her abnormal mental status MRI revealed patchy small volume acute ischemic nonhemorrhagic left cerebellar infarcts as well as remote lacunar infarct infarcts in the left thalamic and bilateral cerebral hemispheres.  Because of recent GI bleeding with duodenal ulcer she was only started on for stroke prophylaxis.  Because of her atrial fibrillation she was started on Cardizem infusion with adequate rate control and was transitioned to oral diltiazem and metoprolol at time of discharge.  She had previously had issues of atrial fibrillation but as an outpatient had declined apixaban.  She was subsequently discharged to the above-stated facility for further rehabilitative therapies.  According to the nursing home documentation this past Thursday patient began having respiratory symptoms associated with decreased oral intake, confusion and increased swelling in her legs as well as lower  abdominal pain.  He was empirically started on Levaquin for treatment of pneumonia.  She was sent to the ER today with worsening altered mental status.  Upon presenting to the ER she was found to have a rectal temperature of 94.6, she was mildly tachypneic, she was tachycardic and her O2 saturations were 89% on 2 L oxygen.  Chest x-ray revealed bibasilar and right upper lobe airspace disease likely due to pneumonia versus asymptomatic pulmonary edema.  Labs were significant for hyperglycemia glucose 149, elevated BUN of 35, normal creatinine, moderate transaminitis with AST 184 and ALT 153 with a normal total bilirubin, elevated BNP of 1313, elevated troponin of 0.72, lactic acid was 3.23 with subtle decrease to 2.4 after volume resuscitation.  White count was 13,400 with neutrophils 83% and absolute neutrophils 11.1%, hemoglobin was 8 and platelets 219,000.  Urinalysis was under.  Blood cultures were obtained in the ER.  Clinical picture and exam consistent with severe sepsis likely related to healthcare acquired pneumonia.  Patient is a DNR.  Upon examination of the patient and further review of clinical status and findings with the patient's daughter who is at the bedside it was opted that based on the patient's prior expressed wishes that focusing on comfort measures would be in the best interest of the patient.  Patient had previously received doses of empiric broad-spectrum antibiotics and multiple labs have been drawn.  She will be admitted to medical floor to initiate comfort measures.  Anticipate hospital death.  ED Course:  Vital Signs: BP 98/67   Pulse (!) 106   Temp (!) 94.5 F (34.7 C) (Rectal)   Resp (!) 34   Ht 5\' 1"  (1.549 m)  Wt 49 kg (108 lb)   SpO2 (!) 89%   BMI 20.41 kg/m  Chest x-ray and lab data: As above CT of the abdomen and pelvis with contrast: 1.5 cm linear metallic density structure in the rectosigmoid colon; small to moderate bilateral pleural effusions larger on the  right. Medications and treatments: Normal saline bolus times 500 cc, Maxipime 1 g IV x1, vancomycin 1 g IV x1  Review of Systems:  **Unable to obtain from patient secondary to altered mental status in the context of severe sepsis.  History obtained from patient's daughter and medical record.   Past Medical History:  Diagnosis Date  . Abdominal pain, right upper quadrant 07/24/2014  . Acute on chronic diastolic CHF (congestive heart failure), NYHA class 2 ([place]) 02/15/2015  . Arthritis   . Atrial fibrillation with RVR ([place]) 06/14/2014  . Back pain   . Cerebral infarction ([place]) 08/08/2013  . Chronic diastolic CHF (congestive heart failure) ([place])   . Colon cancer ([place])   . Dyspnea 07/24/2014  . History of CVA (cerebrovascular accident) 05/25/2017  . HTN (hypertension) 08/08/2013  . Hypertension   . Hypoglycemia   . PAF (paroxysmal atrial fibrillation) ([place])   . PMR (polymyalgia rheumatica) ([place]) 08/08/2013  . Polymyalgia rheumatica ([place])   . Pulmonary hypertension ([place])    a. Echo 07/2014 - mildly increased PASP.  Marland Kitchen Right knee injury, subsequent encounter 01/14/2017  . Spastic hemiplegia affecting dominant side ([place]) 08/08/2013  . Stroke ([place])   . Symptomatic anemia 05/25/2017  . Tachycardia 01/14/2017  . TIA (transient ischemic attack) 05/25/2017  . Upper GI bleed 05/25/2017    Past Surgical History:  Procedure Laterality Date  . BILATERAL OOPHORECTOMY    . CESAREAN SECTION    . COLON SURGERY    . ESOPHAGOGASTRODUODENOSCOPY (EGD) WITH PROPOFOL N/A 05/26/2017   Procedure: ESOPHAGOGASTRODUODENOSCOPY (EGD) WITH PROPOFOL;  Surgeon: [name], MD;  Location: [facility];  Service: Gastroenterology;  Laterality: N/A;  . EYE SURGERY      Social History   Socioeconomic History  . Marital status: Widowed    Spouse name: Not on file  . Number of children: Not on file  . Years of education: Not on file  . Highest education level: Not on file  Social Needs  . Financial resource strain:  Not on file  . Food insecurity - worry: Not on file  . Food insecurity - inability: Not on file  . Transportation needs - medical: Not on file  . Transportation needs - non-medical: Not on file  Occupational History  . Not on file  Tobacco Use  . Smoking status: Never Smoker  . Smokeless tobacco: Never Used  Substance and Sexual Activity  . Alcohol use: No  . Drug use: No  . Sexual activity: No  Other Topics Concern  . Not on file  Social History Narrative   Admitted to [facility]   Widowed   Never smoked   Alcohol none   DNR    Mobility: Assistance required Work history: Not obtained   Allergies  Allergen Reactions  . Chocolate Other (See Comments)    Unknown  . Statins Other (See Comments)    myalgia    Family History  Problem Relation Age of Onset  . Aneurysm Mother   . Heart attack Father      Prior to Admission medications   Medication Sig Start Date End Date Taking? Authorizing Provider  ALPRAZolam Duanne Moron) 0.25 MG tablet Take 0.25 mg by  mouth 2 (two) times daily as needed for anxiety.   Yes [provider]  aspirin 81 MG chewable tablet Chew 81 mg by mouth daily.   Yes [provider]  busPIRone (BUSPAR) 10 MG tablet Take 10 mg by mouth 3 (three) times daily.   Yes [provider]  diltiazem (CARDIZEM CD) 180 MG 24 hr capsule Take 1 capsule (180 mg total) by mouth daily. 11/12/16  Yes Thurnell Lose, MD  feeding supplement, ENSURE ENLIVE, (ENSURE ENLIVE) LIQD Take 237 mLs by mouth 2 (two) times daily between meals. 05/29/17  Yes Arrien, Jimmy Picket, MD  furosemide (LASIX) 40 MG tablet Take 40 mg by mouth daily.   Yes [provider]  ipratropium-albuterol (DUONEB) 0.5-2.5 (3) MG/3ML SOLN Take 3 mLs by nebulization every 6 (six) hours as needed.   Yes [provider]  levofloxacin (LEVAQUIN) 750 MG tablet Take 750 mg by mouth daily.   Yes [provider]  metoprolol succinate  (TOPROL-XL) 25 MG 24 hr tablet Take 25 mg by mouth daily.   Yes [provider]  Multiple Vitamins-Minerals (VISION FORMULA/LUTEIN PO) Take 1 tablet by mouth 2 (two) times daily.    Yes [provider]  polyethylene glycol (MIRALAX / GLYCOLAX) packet Take 17 g by mouth daily.   Yes [provider]  potassium chloride SA (K-DUR,KLOR-CON) 20 MEQ tablet Take 40 mEq by mouth daily.    Yes [provider]  predniSONE (DELTASONE) 5 MG tablet Take 5 mg by mouth daily.  04/15/17  Yes [provider]  sennosides-docusate sodium (SENOKOT-S) 8.6-50 MG tablet Take 1 tablet by mouth daily as needed for constipation.    Yes [provider]  acetaminophen (TYLENOL) 325 MG tablet Take 325 mg every 6 (six) hours as needed by mouth for moderate pain.     [provider]  metoprolol succinate (TOPROL-XL) 50 MG 24 hr tablet take 1 tablet by mouth once daily with food or IMMEDIATELY FOLLOWING A MEAL Patient not taking: Reported on 07/04/2017 03/19/17   Dorothy Spark, MD  pantoprazole (PROTONIX) 40 MG tablet Take 1 tablet (40 mg total) by mouth 2 (two) times daily. 05/29/17 07/28/2017  Tawni Millers, MD    Physical Exam: Vitals:   06/06/2017 1400 06/15/2017 1419 07/01/2017 1503 06/10/2017 1509  BP: 111/75  98/67   Pulse: (!) 102  (!) 106   Resp: (!) 21  (!) 34   Temp:    (!) 94.5 F (34.7 C)  TempSrc:    Rectal  SpO2: (!) 89%  (!) 89%   Weight:  49 kg (108 lb)    Height:  5\' 1"  (1.549 m)        Constitutional: Appears acutely ill and toxic, ill Eyes: PERRL, lids and conjunctivae normal ENMT: Mucous membranes are dry and appear to have dried blood on the teeth and tongue. Posterior pharynx clear of any exudate or lesions. Neck: normal, supple, no masses, no thyromegaly Respiratory: Coarse to auscultation with expiratory rhonchi.  Increased respiratory rate with increased work of breathing on nasal cannula oxygen. Cardiovascular: Regular  nightly tachycardic rate and rhythm, no murmurs / rubs / gallops. No extremity edema. 2+ pedal pulses. No carotid bruits.  Abdomen: Suprapubic tenderness palpation, no masses palpated. No hepatosplenomegaly. Bowel sounds positive.  Musculoskeletal: no clubbing / cyanosis. No joint deformity upper and lower extremities. Good ROM, no contractures. Normal muscle tone.  Skin: no rashes, lesions, ulcers. No induration-dry and pale with hands cool to touch  warming blanket in place. Neurologic: CN 2-12 grossly intact. Sensation intact, DTR normal. Strength 4/5 x all 4 extremities.  Psychiatric: Alert but confused.  Oriented to name.  Repeatedly asking for cold water.   Labs on Admission: I have personally reviewed following labs and imaging studies  CBC: Recent Labs  Lab 06/20/2017 1323  WBC 13.4*  NEUTROABS 11.1*  HGB 8.0*  HCT 27.7*  MCV 82.7  PLT 542   Basic Metabolic Panel: Recent Labs  Lab 06/18/2017 1323  NA 137  K 4.9  CL 101  CO2 27  GLUCOSE 149*  BUN 35*  CREATININE 0.86  CALCIUM 8.7*   GFR: Estimated Creatinine Clearance: 28.2 mL/min (by C-G formula based on SCr of 0.86 mg/dL). Liver Function Tests: Recent Labs  Lab 06/15/2017 1323  AST 184*  ALT 153*  ALKPHOS 103  BILITOT 1.0  PROT 5.8*  ALBUMIN 3.1*   No results for input(s): LIPASE, AMYLASE in the last 168 hours. No results for input(s): AMMONIA in the last 168 hours. Coagulation Profile: No results for input(s): INR, PROTIME in the last 168 hours. Cardiac Enzymes: Recent Labs  Lab 07/04/2017 1323  TROPONINI 0.72*   BNP (last 3 results) Recent Labs    04/27/17 1347  PROBNP 3,438*   HbA1C: No results for input(s): HGBA1C in the last 72 hours. CBG: No results for input(s): GLUCAP in the last 168 hours. Lipid Profile: No results for input(s): CHOL, HDL, LDLCALC, TRIG, CHOLHDL, LDLDIRECT in the last 72 hours. Thyroid Function Tests: No results for input(s): TSH, T4TOTAL, FREET4, T3FREE, THYROIDAB in the  last 72 hours. Anemia Panel: No results for input(s): VITAMINB12, FOLATE, FERRITIN, TIBC, IRON, RETICCTPCT in the last 72 hours. Urine analysis:    Component Value Date/Time   COLORURINE YELLOW 06/22/2017 1311   APPEARANCEUR HAZY (A) 06/13/2017 1311   LABSPEC 1.015 06/11/2017 1311   PHURINE 5.0 06/17/2017 1311   GLUCOSEU NEGATIVE 06/10/2017 1311   HGBUR NEGATIVE 07/01/2017 1311   BILIRUBINUR NEGATIVE 06/10/2017 1311   KETONESUR NEGATIVE 06/26/2017 1311   PROTEINUR 30 (A) 07/05/2017 1311   UROBILINOGEN 0.2 12/07/2014 1325   NITRITE NEGATIVE 06/26/2017 1311   LEUKOCYTESUR NEGATIVE 07/04/2017 1311   Sepsis Labs: @LABRCNTIP (procalcitonin:4,lacticidven:4) )No results found for this or any previous visit (from the past 240 hour(s)).   Radiological Exams on Admission: Ct Abdomen Pelvis W Contrast  Result Date: 07/05/2017 CLINICAL DATA:  Sepsis. Shortness of breath and altered mental status. EXAM: CT ABDOMEN AND PELVIS WITH CONTRAST TECHNIQUE: Multidetector CT imaging of the abdomen and pelvis was performed using the standard protocol following bolus administration of intravenous contrast. CONTRAST:  100 ml ISOVUE-300 IOPAMIDOL (ISOVUE-300) INJECTION 61% COMPARISON:  CT abdomen and pelvis 04/13/2013. FINDINGS: Lower chest: There is partial visualization of small to moderate pleural effusions, larger on the right. Marked cardiomegaly is present. The ascending thoracic aorta is mildly dilated at 4.2 cm. No pericardial effusion. Compressive atelectasis in the lung bases is identified. Hepatobiliary: The gallbladder and biliary tree appear normal. The liver appears normal. Pancreas: Unremarkable. No pancreatic ductal dilatation or surrounding inflammatory changes. Spleen: Normal in size without focal abnormality. Adrenals/Urinary Tract: Adrenal glands are unremarkable. Kidneys are normal, without renal calculi, focal lesion, or hydronephrosis. Bladder is unremarkable. Stomach/Bowel: No evidence of  appendicitis is seen. A linear radiopaque density measuring 1.5 cm is seen in the rectosigmoid colon. The colon is otherwise unremarkable. Stomach and small bowel appear normal. Vascular/Lymphatic: Extensive aortoiliac atherosclerosis without aneurysm is identified. The aorta is markedly tortuous. Reproductive: Status  post hysterectomy. No adnexal masses. Other: No ascites or focal fluid collection. Musculoskeletal: Severe convex left lumbar scoliosis. No fracture or focal lesion. IMPRESSION: No acute abnormality in the abdomen or pelvis. No source for sepsis. 1.5 cm linear metallic density structure in the rectosigmoid colon may be a foreign body. Small to moderate bilateral pleural effusions, larger on the right. Cardiomegaly. Extensive aortoiliac atherosclerosis. Mild aneurysmal dilatation of the ascending thoracic aorta at 4.2 cm. Electronically Signed   By: Inge Rise M.D.   On: 06/10/2017 16:13   Dg Abdomen Acute W/chest  Result Date: 06/14/2017 CLINICAL DATA:  Altered mental status since 06/25/2017. The patient was diagnosed with pneumonia 06/24/2017. EXAM: DG ABDOMEN ACUTE W/ 1V CHEST COMPARISON:  Single-view of the chest 05/25/2017 and PA and lateral chest 11/10/2016. FINDINGS: Single-view of the chest demonstrates cardiomegaly. Lung volumes are low. Airspace disease is seen in the right upper lobe and in the lung bases bilaterally. Extensive atherosclerosis is present. Convex right thoracic scoliosis noted. Two views of the abdomen show no free intraperitoneal air. The bowel gas pattern is nonobstructive. Extensive atherosclerosis is identified. There is severe convex left scoliosis. IMPRESSION: Bibasilar and right upper lobe airspace disease could be due to pneumonia and/or asymmetric pulmonary edema in this patient with marked cardiomegaly and small bilateral pleural effusions. No acute finding in the abdomen. Extensive atherosclerosis. Scoliosis. Electronically Signed   By: Inge Rise  M.D.   On: 06/29/2017 13:55      Assessment/Plan Principal Problem:   Severe sepsis with acute organ dysfunction 2/2 HCAP (healthcare-associated pneumonia) -Patient presents with altered mental status, hypothermia, hypotension and hypoxemia with abnormal chest x-ray concerning for healthcare acquired pneumonia -Patient with multiple comorbidities including recent hospitalization for GI bleed, acute stroke and A. fib with RVR -Prognosis is poor and anticipate hospital death -Both myself and Dr. Evangeline Gula discussed at length with patient's daughter who agrees that comfort measures would be what her mother would want these will be initiated -Begin with IV morphine prn for pain and air hunger-requires increased frequency of use can transition to morphine infusion -Ativan IV for anxiety -End of life order set initiated    DVT prophylaxis: EOL Code Status: DNR Family Communication: Daughter who is power of attorney Disposition Plan: TBD-anticipate hospital death but of progress low consider formal hospice consultation for possible transition to residential hospice Consults called: None    Tajai Ihde L. ANP-BC [facility] Pager [phone]   If 7PM-7AM, please contact night-coverage [email or web address] Password [facility]  06/16/2017, 4:34 PM

## 2017-06-27 NOTE — ED Triage Notes (Signed)
Per EMS pt comes from [facility].  She comes in today because of AMS started this past Thursday.  She was diagnosed on Tuesday with pneumonia.  She is hard of hearing She has a recent history of Gastric ulcer and stroke.  Pt is oriented x 4.

## 2017-06-28 DIAGNOSIS — R6520 Severe sepsis without septic shock: Secondary | ICD-10-CM

## 2017-06-28 DIAGNOSIS — A419 Sepsis, unspecified organism: Principal | ICD-10-CM

## 2017-06-28 DIAGNOSIS — L8990 Pressure ulcer of unspecified site, unspecified stage: Secondary | ICD-10-CM

## 2017-07-02 LAB — CULTURE, BLOOD (ROUTINE X 2)
Culture: NO GROWTH
Culture: NO GROWTH
SPECIAL REQUESTS: ADEQUATE
Special Requests: ADEQUATE

## 2017-07-04 ENCOUNTER — Encounter: Payer: Self-pay | Admitting: Internal Medicine

## 2017-07-07 NOTE — Progress Notes (Signed)
[facility] [phone]. Referral Katy Apo.

## 2017-07-07 NOTE — Progress Notes (Signed)
Bladder scanned pt for 763 cc.  Inserted 75F foley. Temp is 99.4 rectally.  Temp probe removed. Pt appears comfortable, unresponsive.

## 2017-07-07 NOTE — Progress Notes (Signed)
Pt passes away at 0944.  Dr. Posey Pronto notified and will the death certificate.  Dtr called and left a message on her cell phone.  Son, Krystal Hensley, called and reached.  He will call his sister.  Will await funeral arrangements from family.

## 2017-07-07 NOTE — Discharge Summary (Signed)
[facility] Death Summary   Patient: Krystal Hensley KWI:[id number]   PCP: Lajean Manes, MD DOB: 09/09/1917   Date of admission: 07-01-17   Date and time of death: 2017/07/02 9:44 Am   Hospital Diagnoses:  Principal Problem:   Severe sepsis with acute organ dysfunction ([place]) Active Problems:   HCAP (healthcare-associated pneumonia)   Pressure injury of skin    History of present illness: As per the H and P dictated on admission, "Krystal Hensley is a 84 y.o. female with medical history significant for hypertension, arthritis with back pain, remote history of colon cancer, polymyalgia rheumatica on chronic steroids and history of stroke.  She was recently hospitalized at this facility from 11/19-11/23.  She presented at that time with dark tarry stools that were heme positive.  In the ER she was found to have atrial fibrillation with RVR.  She also presented with expressive aphasia.  For the bleeding she was transfused 2 units of packed red blood cells.  EGD revealed Barrett's esophagitis (grade B), and erythematous mucosa of the cardia, gastric fundus, body and antrum.  She was also found to have multiple oozing duodenal ulcers which were injected and clips were placed.  In regards to her abnormal mental status MRI revealed patchy small volume acute ischemic nonhemorrhagic left cerebellar infarcts as well as remote lacunar infarct infarcts in the left thalamic and bilateral cerebral hemispheres.  Because of recent GI bleeding with duodenal ulcer she was only started on for stroke prophylaxis.  Because of her atrial fibrillation she was started on Cardizem infusion with adequate rate control and was transitioned to oral diltiazem and metoprolol at time of discharge.  She had previously had issues of atrial fibrillation but as an outpatient had declined apixaban.  She was subsequently discharged to the above-stated facility for further rehabilitative therapies.  According to the nursing  home documentation this past Thursday patient began having respiratory symptoms associated with decreased oral intake, confusion and increased swelling in her legs as well as lower abdominal pain.  He was empirically started on Levaquin for treatment of pneumonia.  She was sent to the ER today with worsening altered mental status.  Upon presenting to the ER she was found to have a rectal temperature of 94.6, she was mildly tachypneic, she was tachycardic and her O2 saturations were 89% on 2 L oxygen.  Chest x-ray revealed bibasilar and right upper lobe airspace disease likely due to pneumonia versus asymptomatic pulmonary edema.  Labs were significant for hyperglycemia glucose 149, elevated BUN of 35, normal creatinine, moderate transaminitis with AST 184 and ALT 153 with a normal total bilirubin, elevated BNP of 1313, elevated troponin of 0.72, lactic acid was 3.23 with subtle decrease to 2.4 after volume resuscitation.  White count was 13,400 with neutrophils 83% and absolute neutrophils 11.1%, hemoglobin was 8 and platelets 219,000.  Urinalysis was under.  Blood cultures were obtained in the ER.  Clinical picture and exam consistent with severe sepsis likely related to healthcare acquired pneumonia.  Patient is a DNR.  Upon examination of the patient and further review of clinical status and findings with the patient's daughter who is at the bedside it was opted that based on the patient's prior expressed wishes that focusing on comfort measures would be in the best interest of the patient.  Patient had previously received doses of empiric broad-spectrum antibiotics and multiple labs have been drawn.  She will be admitted to medical floor to initiate comfort measures.  Anticipate hospital  death.  ED Course:  Vital Signs: BP 98/67   Pulse (!) 106   Temp (!) 94.5 F (34.7 C) (Rectal)   Resp (!) 34   Ht 5\' 1"  (1.549 m)   Wt 49 kg (108 lb)   SpO2 (!) 89%   BMI 20.41 kg/m  Chest x-ray and lab data: As  above CT of the abdomen and pelvis with contrast: 1.5 cm linear metallic density structure in the rectosigmoid colon; small to moderate bilateral pleural effusions larger on the right. Medications and treatments: Normal saline bolus times 500 cc, Maxipime 1 g IV x1, vancomycin 1 g IV x1"  Hospital Course:  Acute encephalopathy Sepsis Healthcare associated pneumonia Hypothermia Elevated troponin Elevated LFT Leukocytosis Anemia Urinary retention.  The patient presented with complaints of acute encephalopathy.  Workup in the ER showed that she had bladder fullness, constipation, septic presentation.  Patient received initial resuscitation therapy.  On discussion with admitting provider family decided to choose complete comfort over aggressive care for the patient.  Patient was started on comfort care pathway with anticipation of hospital death. The patient was pronounced deceased at 2017/07/28, on 9:44 AM.  Procedures and Results:  none   Consultations:  none  The results of significant diagnostics from this hospitalization (including imaging, microbiology, ancillary and laboratory) are listed below for reference.    Significant Diagnostic Studies: Ct Abdomen Pelvis W Contrast  Result Date: 06/14/2017 CLINICAL DATA:  Sepsis. Shortness of breath and altered mental status. EXAM: CT ABDOMEN AND PELVIS WITH CONTRAST TECHNIQUE: Multidetector CT imaging of the abdomen and pelvis was performed using the standard protocol following bolus administration of intravenous contrast. CONTRAST:  100 ml ISOVUE-300 IOPAMIDOL (ISOVUE-300) INJECTION 61% COMPARISON:  CT abdomen and pelvis 04/13/2013. FINDINGS: Lower chest: There is partial visualization of small to moderate pleural effusions, larger on the right. Marked cardiomegaly is present. The ascending thoracic aorta is mildly dilated at 4.2 cm. No pericardial effusion. Compressive atelectasis in the lung bases is identified. Hepatobiliary: The  gallbladder and biliary tree appear normal. The liver appears normal. Pancreas: Unremarkable. No pancreatic ductal dilatation or surrounding inflammatory changes. Spleen: Normal in size without focal abnormality. Adrenals/Urinary Tract: Adrenal glands are unremarkable. Kidneys are normal, without renal calculi, focal lesion, or hydronephrosis. Bladder is unremarkable. Stomach/Bowel: No evidence of appendicitis is seen. A linear radiopaque density measuring 1.5 cm is seen in the rectosigmoid colon. The colon is otherwise unremarkable. Stomach and small bowel appear normal. Vascular/Lymphatic: Extensive aortoiliac atherosclerosis without aneurysm is identified. The aorta is markedly tortuous. Reproductive: Status post hysterectomy. No adnexal masses. Other: No ascites or focal fluid collection. Musculoskeletal: Severe convex left lumbar scoliosis. No fracture or focal lesion. IMPRESSION: No acute abnormality in the abdomen or pelvis. No source for sepsis. 1.5 cm linear metallic density structure in the rectosigmoid colon may be a foreign body. Small to moderate bilateral pleural effusions, larger on the right. Cardiomegaly. Extensive aortoiliac atherosclerosis. Mild aneurysmal dilatation of the ascending thoracic aorta at 4.2 cm. Electronically Signed   By: Inge Rise M.D.   On: 06/30/2017 16:13   Dg Abdomen Acute W/chest  Result Date: 06/18/2017 CLINICAL DATA:  Altered mental status since 06/25/2017. The patient was diagnosed with pneumonia 06/24/2017. EXAM: DG ABDOMEN ACUTE W/ 1V CHEST COMPARISON:  Single-view of the chest 05/25/2017 and PA and lateral chest 11/10/2016. FINDINGS: Single-view of the chest demonstrates cardiomegaly. Lung volumes are low. Airspace disease is seen in the right upper lobe and in the lung bases bilaterally. Extensive atherosclerosis  is present. Convex right thoracic scoliosis noted. Two views of the abdomen show no free intraperitoneal air. The bowel gas pattern is  nonobstructive. Extensive atherosclerosis is identified. There is severe convex left scoliosis. IMPRESSION: Bibasilar and right upper lobe airspace disease could be due to pneumonia and/or asymmetric pulmonary edema in this patient with marked cardiomegaly and small bilateral pleural effusions. No acute finding in the abdomen. Extensive atherosclerosis. Scoliosis. Electronically Signed   By: Inge Rise M.D.   On: 06/14/2017 13:55    Microbiology: Recent Results (from the past 240 hour(s))  Culture, blood (routine x 2)     Status: None (Preliminary result)   Collection Time: 06/30/2017  2:45 PM  Result Value Ref Range Status   Specimen Description BLOOD BLOOD RIGHT HAND  Final   Special Requests IN PEDIATRIC BOTTLE Blood Culture adequate volume  Final   Culture NO GROWTH < 24 HOURS  Final   Report Status PENDING  Incomplete  Culture, blood (routine x 2)     Status: None (Preliminary result)   Collection Time: 06/15/2017  2:49 PM  Result Value Ref Range Status   Specimen Description BLOOD BLOOD RIGHT ARM UPPER  Final   Special Requests   Final    BOTTLES DRAWN AEROBIC AND ANAEROBIC Blood Culture adequate volume   Culture NO GROWTH < 24 HOURS  Final   Report Status PENDING  Incomplete     Labs: CBC: Recent Labs  Lab 06/16/2017 1323  WBC 13.4*  NEUTROABS 11.1*  HGB 8.0*  HCT 27.7*  MCV 82.7  PLT 539   Basic Metabolic Panel: Recent Labs  Lab 06/29/2017 1323  NA 137  K 4.9  CL 101  CO2 27  GLUCOSE 149*  BUN 35*  CREATININE 0.86  CALCIUM 8.7*   Liver Function Tests: Recent Labs  Lab 07/03/2017 1323  AST 184*  ALT 153*  ALKPHOS 103  BILITOT 1.0  PROT 5.8*  ALBUMIN 3.1*   No results for input(s): LIPASE, AMYLASE in the last 168 hours. No results for input(s): AMMONIA in the last 168 hours. Cardiac Enzymes: Recent Labs  Lab 06/30/2017 1323  TROPONINI 0.72*   BNP (last 3 results) Recent Labs    11/09/16 1200 06/24/2017 1323  BNP 502.7* 1,313.4*   CBG: No results  for input(s): GLUCAP in the last 168 hours. Time spent: 35 minutes  Signed:  Berle Mull  [facility] 06-Jul-2017, 4:02 PM

## 2017-07-07 NOTE — Progress Notes (Signed)
Pt in the morgue.  Daughter, Nila Nephew called and notified of pt's wedding band still on finger.  It was removed prior to pt going to the morgue and the family will come and get it before 1930 tonight.  Daughter was appreciative of call.

## 2017-07-07 DEATH — deceased

## 2017-07-22 ENCOUNTER — Ambulatory Visit: Payer: Medicare Other | Admitting: Cardiology

## 2017-07-24 ENCOUNTER — Encounter: Payer: Self-pay | Admitting: Internal Medicine

## 2017-07-24 NOTE — Progress Notes (Signed)
Location:  [facility] of Service:  SNF (31)skilled nursing facility Provider: Hennie Duos MD   Patient Care Team: Lajean Manes, MD as PCP - General (Internal Medicine)  Extended Emergency Contact Information Primary Emergency Contact: Smith,Charlene W Address: [address]          [place] [address] Johnnette Litter of [place] Phone: [phone] Mobile Phone: [phone] Relation: Daughter Secondary Emergency Contact: Marrion Coy [place] of [place] Mobile Phone: [phone] Relation: Son    Allergies: Chocolate and Statins  Chief Complaint  Patient presents with  . Acute Visit    HPI: Patient is 84 y.o. female who is being seen for low systolic blood pressures. Patient has not had any symptoms of lightheadedness or weakness chest pain or shortness of breath.  Past Medical History:  Diagnosis Date  . Abdominal pain, right upper quadrant 07/24/2014  . Acute on chronic diastolic CHF (congestive heart failure), NYHA class 2 ([place]) 02/15/2015  . Arthritis   . Atrial fibrillation with RVR ([place]) 06/14/2014  . Back pain   . Cerebral infarction ([place]) 08/08/2013  . Chronic diastolic CHF (congestive heart failure) ([place])   . Colon cancer ([place])   . Dyspnea 07/24/2014  . History of CVA (cerebrovascular accident) 05/25/2017  . HTN (hypertension) 08/08/2013  . Hypertension   . Hypoglycemia   . PAF (paroxysmal atrial fibrillation) ([place])   . PMR (polymyalgia rheumatica) ([place]) 08/08/2013  . Polymyalgia rheumatica ([place])   . Pulmonary hypertension ([place])    a. Echo 07/2014 - mildly increased PASP.  Marland Kitchen Right knee injury, subsequent encounter 01/14/2017  . Spastic hemiplegia affecting dominant side ([place]) 08/08/2013  . Stroke ([place])   . Symptomatic anemia 05/25/2017  . Tachycardia 01/14/2017  . TIA (transient ischemic attack) 05/25/2017  . Upper GI bleed 05/25/2017    Past Surgical History:  Procedure Laterality Date  . BILATERAL  OOPHORECTOMY    . CESAREAN SECTION    . COLON SURGERY    . ESOPHAGOGASTRODUODENOSCOPY (EGD) WITH PROPOFOL N/A 05/26/2017   Procedure: ESOPHAGOGASTRODUODENOSCOPY (EGD) WITH PROPOFOL;  Surgeon: [name], MD;  Location: [facility];  Service: Gastroenterology;  Laterality: N/A;  . EYE SURGERY      Allergies as of 06/09/2017      Reactions   Chocolate Other (See Comments)   Unknown   Statins Other (See Comments)   myalgia      Medication List        Accurate as of 06/09/17 11:59 PM. Always use your most recent med list.          acetaminophen 325 MG tablet Commonly known as:  TYLENOL Take 325 mg every 6 (six) hours as needed by mouth for moderate pain.   aspirin 81 MG chewable tablet Chew 81 mg by mouth daily.   busPIRone 5 MG tablet Commonly known as:  BUSPAR Take 5 mg by mouth daily.   diltiazem 180 MG 24 hr capsule Commonly known as:  CARDIZEM CD Take 1 capsule (180 mg total) by mouth daily.   feeding supplement (ENSURE ENLIVE) Liqd Take 237 mLs by mouth 2 (two) times daily between meals.   furosemide 20 MG tablet Commonly known as:  LASIX Take 20 mg by mouth daily.   metoprolol succinate 50 MG 24 hr tablet Commonly known as:  TOPROL-XL take 1 tablet by mouth once daily with food or IMMEDIATELY FOLLOWING A MEAL   pantoprazole 40 MG tablet Commonly known as:  PROTONIX Take 1 tablet (40  mg total) by mouth 2 (two) times daily.   polyethylene glycol packet Commonly known as:  MIRALAX / GLYCOLAX Take 17 g by mouth daily.   potassium chloride 20 MEQ packet Commonly known as:  KLOR-CON Take 10 mEq by mouth daily.   predniSONE 5 MG tablet Commonly known as:  DELTASONE Take 5 mg by mouth daily.   sennosides-docusate sodium 8.6-50 MG tablet Commonly known as:  SENOKOT-S Take 1 tablet by mouth daily as needed for constipation.   VISION FORMULA/LUTEIN PO Take 1 tablet by mouth 2 (two) times daily.       No orders of the defined types were placed in  this encounter.   Immunization History  Administered Date(s) Administered  . Influenza,inj,Quad PF,6+ Mos 05/28/2015  . Tdap 11/01/2012    Social History   Tobacco Use  . Smoking status: Never Smoker  . Smokeless tobacco: Never Used  Substance Use Topics  . Alcohol use: No    Review of Systems  DATA OBTAINED: from patient, nurse GENERAL:  no fevers, fatigue, appetite changes SKIN: No itching, rash HEENT: No complaint RESPIRATORY: No cough, wheezing, SOB CARDIAC: No chest pain, palpitations, lower extremity edema  GI: No abdominal pain, No N/V/D or constipation, No heartburn or reflux  GU: No dysuria, frequency or urgency, or incontinence  MUSCULOSKELETAL: No unrelieved bone/joint pain NEUROLOGIC: No headache, dizziness  PSYCHIATRIC: No overt anxiety or sadness  Vitals:   07/24/17 2227  BP: 104/78  Pulse: 78  Resp: 18  Temp: 97.8 F (36.6 C)   There is no height or weight on file to calculate BMI. Physical Exam  GENERAL APPEARANCE: Alert, conversant, No acute distress  SKIN: No diaphoresis rash HEENT: Unremarkable RESPIRATORY: Breathing is even, unlabored. Lung sounds are clear   CARDIOVASCULAR: Heart irregularly irregular no murmurs, rubs or gallops. No peripheral edema  GASTROINTESTINAL: Abdomen is soft, non-tender, not distended w/ normal bowel sounds.  GENITOURINARY: Bladder non tender, not distended  MUSCULOSKELETAL: No abnormal joints or musculature NEUROLOGIC: Cranial nerves 2-12 grossly intact. Moves all extremities PSYCHIATRIC: Mood and affect appropriate to situation, no behavioral issues  Patient Active Problem List   Diagnosis Date Noted  . Pressure injury of skin Jul 12, 2017  . Severe sepsis with acute organ dysfunction ([place]) 06/29/2017  . HCAP (healthcare-associated pneumonia) 06/25/2017  . Duodenal ulcer hemorrhage 06/06/2017  . Cerebellar stroke, acute ([place]) 06/06/2017  . Anxiety 06/06/2017  . TIA (transient ischemic attack) 05/25/2017  .  History of CVA (cerebrovascular accident) 05/25/2017  . Symptomatic anemia 05/25/2017  . Upper GI bleed 05/25/2017  . Right knee injury, subsequent encounter 01/14/2017  . Tachycardia 01/14/2017  . Shortness of breath 11/09/2016  . Hyponatremia 11/22/2015  . Acute on chronic diastolic CHF (congestive heart failure), NYHA class 2 ([place]) 02/15/2015  . Dyspnea 07/24/2014  . Abdominal pain, right upper quadrant 07/24/2014  . Chronic diastolic CHF (congestive heart failure) ([place]) 07/24/2014  . Atrial fibrillation with RVR ([place]) 06/14/2014  . Constipation 04/03/2014  . Pulmonary hypertension ([place]) 02/02/2014  . Chest pain 01/14/2014  . Cerebral infarction ([place]) 08/08/2013  . RUE weakness 08/08/2013  . HTN (hypertension) 08/08/2013  . PMR (polymyalgia rheumatica) ([facility]) 08/08/2013  . Spastic hemiplegia affecting dominant side ([place]) 08/08/2013  . Wound of left leg 08/08/2013  . Hx of PAF- CHADS VASC=7 08/08/2013    CMP     Component Value Date/Time   NA 137 06/20/2017 1323   NA 137 04/27/2017 1347   K 4.9 07/04/2017 1323   CL 101 06/26/2017  1323   CO2 27 06/13/2017 1323   GLUCOSE 149 (H) 07/02/2017 1323   BUN 35 (H) 06/30/2017 1323   BUN 21 04/27/2017 1347   CREATININE 0.86 06/16/2017 1323   CALCIUM 8.7 (L) 06/16/2017 1323   PROT 5.8 (L) 06/07/2017 1323   ALBUMIN 3.1 (L) 07/04/2017 1323   AST 184 (H) 06/16/2017 1323   ALT 153 (H) 07/03/2017 1323   ALKPHOS 103 06/26/2017 1323   BILITOT 1.0 07/06/2017 1323   GFRNONAA 55 (L) 07/02/2017 1323   GFRAA >60 06/15/2017 1323   Recent Labs    11/09/16 1200  05/25/17 0720  05/27/17 0309 05/28/17 0153 07/01/2017 1323  NA 133*   < > 141   < > 140 138 137  K 4.8   < > 3.9   < > 3.5 3.4* 4.9  CL 101   < > 111   < > 112* 109 101  CO2 23   < > 21*   < > 22 24 27   GLUCOSE 97   < > 153*   < > 83 93 149*  BUN 15   < > 33*   < > 21* 16 35*  CREATININE 0.67   < > 0.73   < > 0.60 0.65 0.86  CALCIUM 9.0   < > 8.1*   < > 7.8* 7.6* 8.7*  MG 2.2   --  2.3  --   --   --   --   PHOS  --   --  3.0  --   --   --   --    < > = values in this interval not displayed.   Recent Labs    05/25/17 0720 05/25/17 2220 06/26/2017 1323  AST 30 31 184*  ALT 19 19 153*  ALKPHOS 73 70 103  BILITOT 0.5 3.0* 1.0  PROT 4.7* 4.6* 5.8*  ALBUMIN 2.5* 2.5* 3.1*   Recent Labs    05/25/17 0720  05/27/17 0309 05/28/17 0153 06/21/2017 1323  WBC 11.5*  12.5*   < > 9.7 9.8 13.4*  NEUTROABS 9.4*  9.6*  --   --  7.6 11.1*  HGB 6.6*  6.8*   < > 8.6* 8.3* 8.0*  HCT 20.8*  21.7*   < > 27.1* 26.2* 27.7*  MCV 104.0*  103.8*   < > 101.1* 101.6* 82.7  PLT 201  216   < > 175 158 219   < > = values in this interval not displayed.   Recent Labs    05/26/17 0442  CHOL 116  LDLCALC 57  TRIG 160*   No results found for: [facility] Lab Results  Component Value Date   TSH 1.171 11/09/2016   Lab Results  Component Value Date   HGBA1C 5.4 05/26/2017   Lab Results  Component Value Date   CHOL 116 05/26/2017   HDL 27 (L) 05/26/2017   LDLCALC 57 05/26/2017   TRIG 160 (H) 05/26/2017   CHOLHDL 4.3 05/26/2017    Significant Diagnostic Results in last 30 days:  Ct Abdomen Pelvis W Contrast  Result Date: 06/26/2017 CLINICAL DATA:  Sepsis. Shortness of breath and altered mental status. EXAM: CT ABDOMEN AND PELVIS WITH CONTRAST TECHNIQUE: Multidetector CT imaging of the abdomen and pelvis was performed using the standard protocol following bolus administration of intravenous contrast. CONTRAST:  100 ml ISOVUE-300 IOPAMIDOL (ISOVUE-300) INJECTION 61% COMPARISON:  CT abdomen and pelvis 04/13/2013. FINDINGS: Lower chest: There is partial visualization of small to moderate pleural effusions, larger  on the right. Marked cardiomegaly is present. The ascending thoracic aorta is mildly dilated at 4.2 cm. No pericardial effusion. Compressive atelectasis in the lung bases is identified. Hepatobiliary: The gallbladder and biliary tree appear normal. The liver appears  normal. Pancreas: Unremarkable. No pancreatic ductal dilatation or surrounding inflammatory changes. Spleen: Normal in size without focal abnormality. Adrenals/Urinary Tract: Adrenal glands are unremarkable. Kidneys are normal, without renal calculi, focal lesion, or hydronephrosis. Bladder is unremarkable. Stomach/Bowel: No evidence of appendicitis is seen. A linear radiopaque density measuring 1.5 cm is seen in the rectosigmoid colon. The colon is otherwise unremarkable. Stomach and small bowel appear normal. Vascular/Lymphatic: Extensive aortoiliac atherosclerosis without aneurysm is identified. The aorta is markedly tortuous. Reproductive: Status post hysterectomy. No adnexal masses. Other: No ascites or focal fluid collection. Musculoskeletal: Severe convex left lumbar scoliosis. No fracture or focal lesion. IMPRESSION: No acute abnormality in the abdomen or pelvis. No source for sepsis. 1.5 cm linear metallic density structure in the rectosigmoid colon may be a foreign body. Small to moderate bilateral pleural effusions, larger on the right. Cardiomegaly. Extensive aortoiliac atherosclerosis. Mild aneurysmal dilatation of the ascending thoracic aorta at 4.2 cm. Electronically Signed   By: Inge Rise M.D.   On: 06/11/2017 16:13   Dg Abdomen Acute W/chest  Result Date: 06/11/2017 CLINICAL DATA:  Altered mental status since 06/25/2017. The patient was diagnosed with pneumonia 06/24/2017. EXAM: DG ABDOMEN ACUTE W/ 1V CHEST COMPARISON:  Single-view of the chest 05/25/2017 and PA and lateral chest 11/10/2016. FINDINGS: Single-view of the chest demonstrates cardiomegaly. Lung volumes are low. Airspace disease is seen in the right upper lobe and in the lung bases bilaterally. Extensive atherosclerosis is present. Convex right thoracic scoliosis noted. Two views of the abdomen show no free intraperitoneal air. The bowel gas pattern is nonobstructive. Extensive atherosclerosis is identified. There is severe  convex left scoliosis. IMPRESSION: Bibasilar and right upper lobe airspace disease could be due to pneumonia and/or asymmetric pulmonary edema in this patient with marked cardiomegaly and small bilateral pleural effusions. No acute finding in the abdomen. Extensive atherosclerosis. Scoliosis. Electronically Signed   By: Inge Rise M.D.   On: 06/26/2017 13:55    Assessment and   Low blood pressure-blood pressures since patient's admission were reviewed; systolic blood pressures are at 100 or below consistently; will decrease her Toprol-XL 50 mg daily to Toprol-XL 25 mg by mouth daily; will monitor response    Inocencio Homes, MD

## 2017-08-17 ENCOUNTER — Ambulatory Visit: Payer: Medicare Other | Admitting: Nurse Practitioner

## 2018-09-09 IMAGING — US US EXTREM LOW VENOUS*L*
1 series · 13 of 24 positions shown · non-contrast
Comparison: None.

CLINICAL DATA: [AGE] female with a history of swelling



[Series 1: us extrem low venous*left* · 0.06mm/px · 28 acquisitions, 13 frames shown]
[im 1/28]
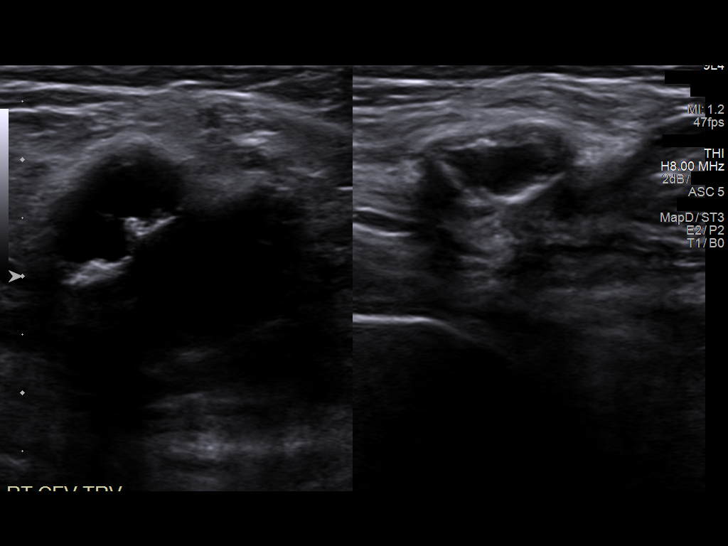
[im 3/28]
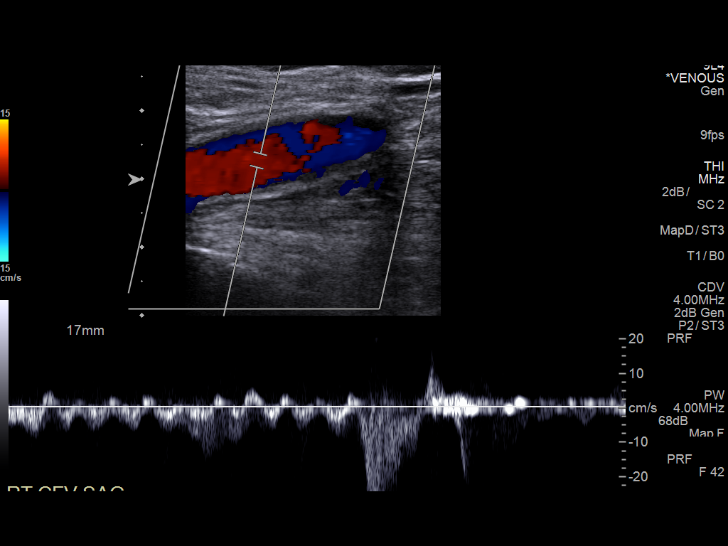
[im 5/28]
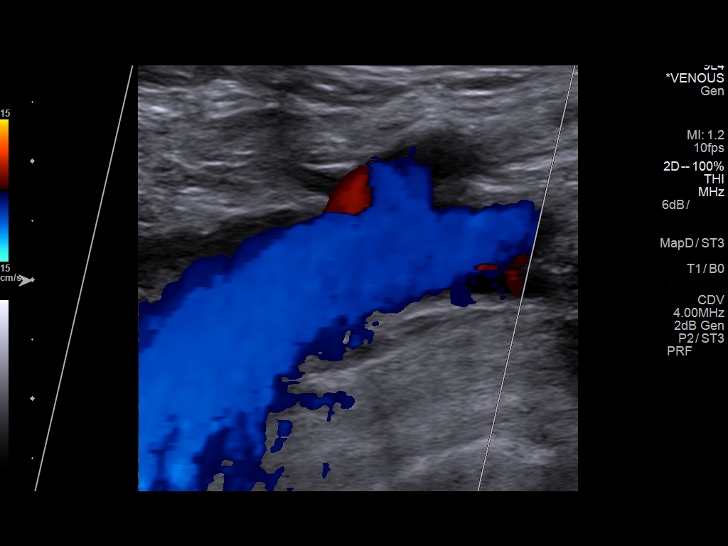
[im 8/28]
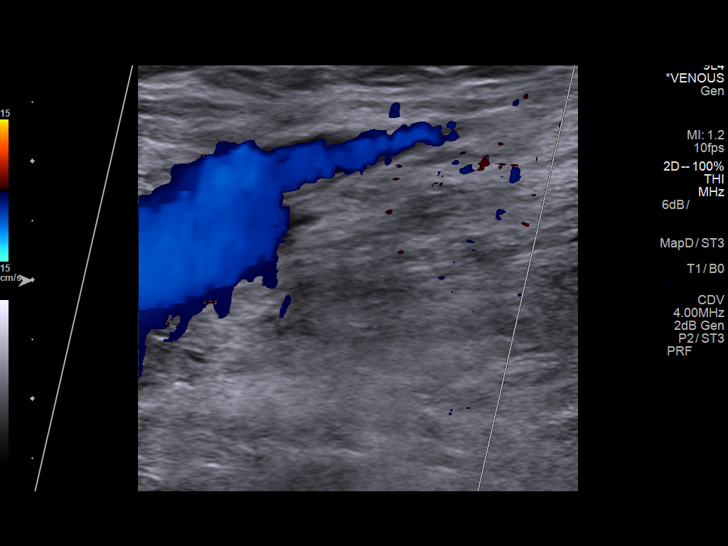
[im 10/28]
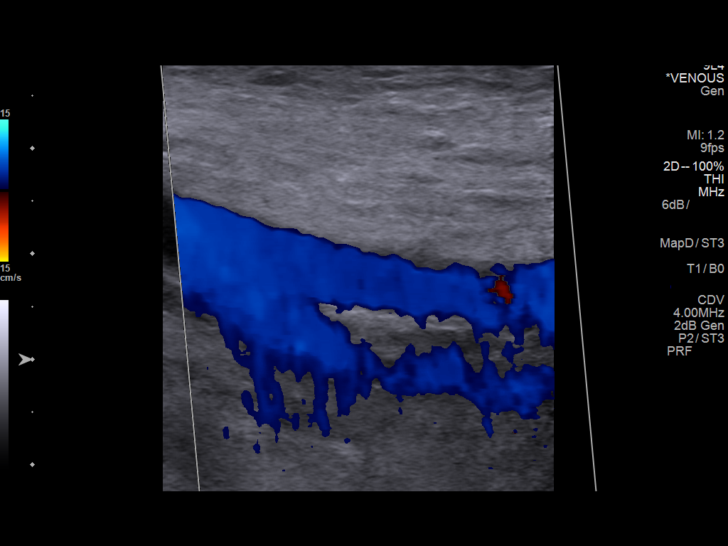
[im 12/28]
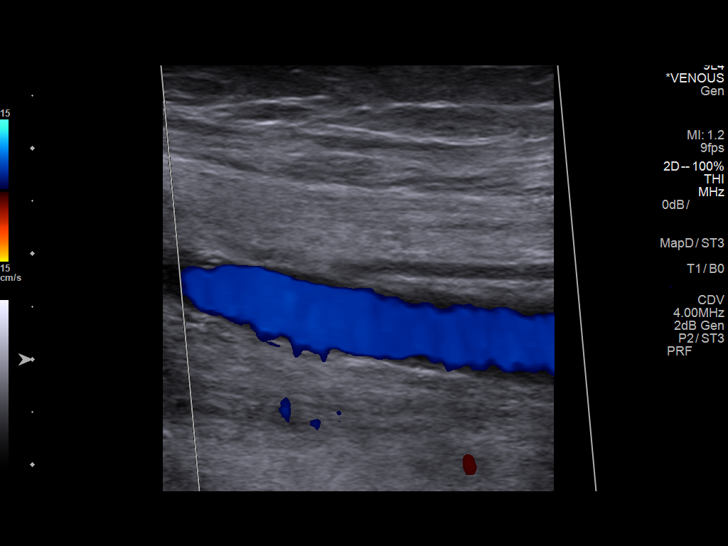
[im 16/28]
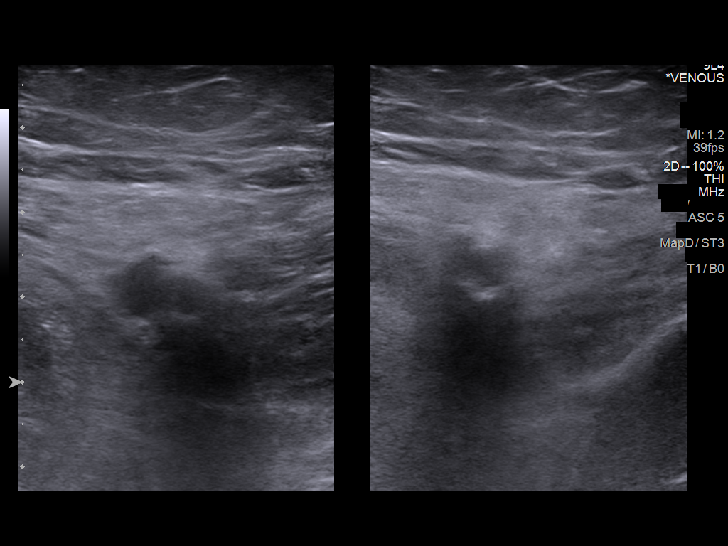
[im 17/28]
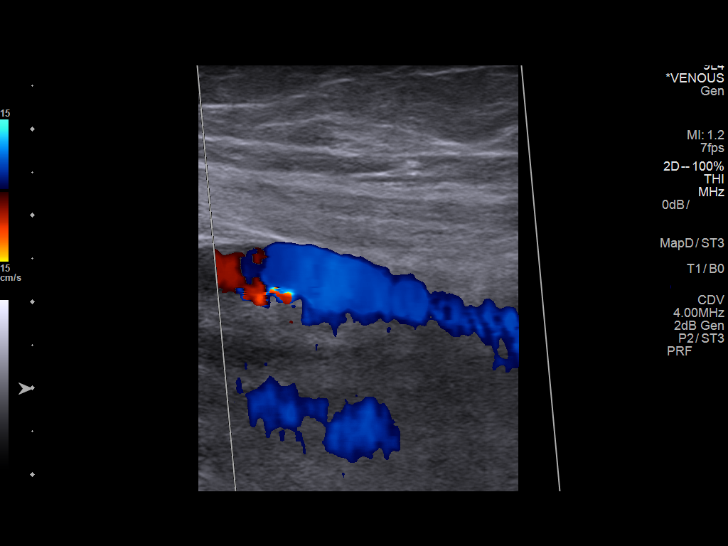
[im 19/28]
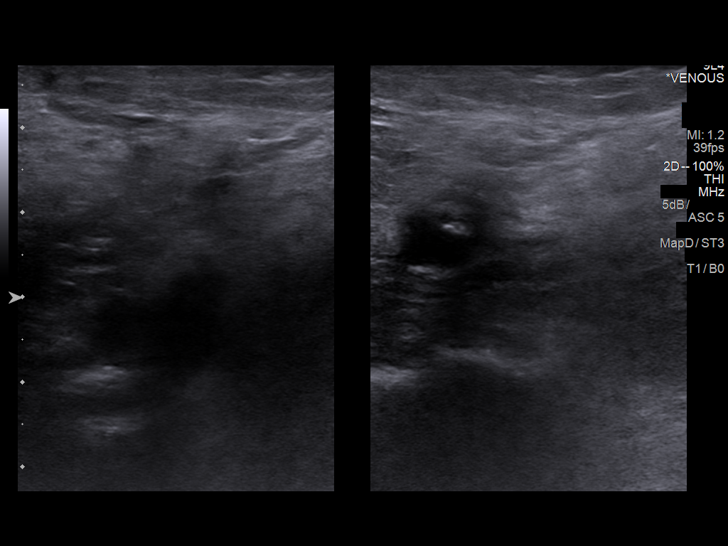
[im 22/28]
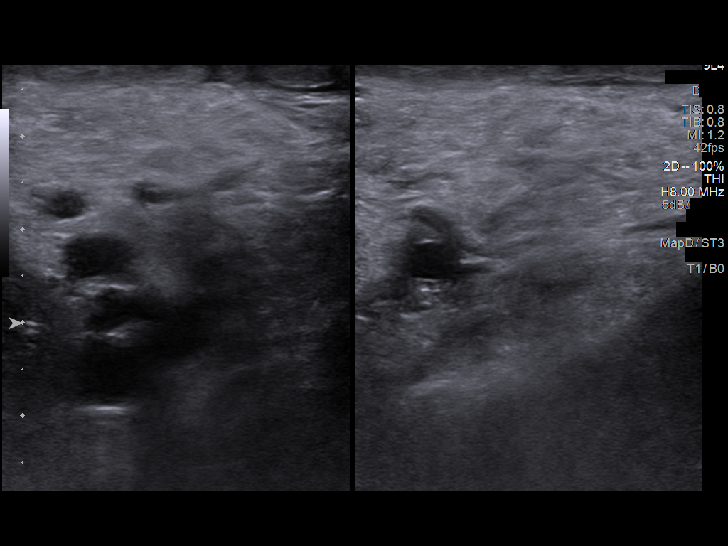
[im 24/28]
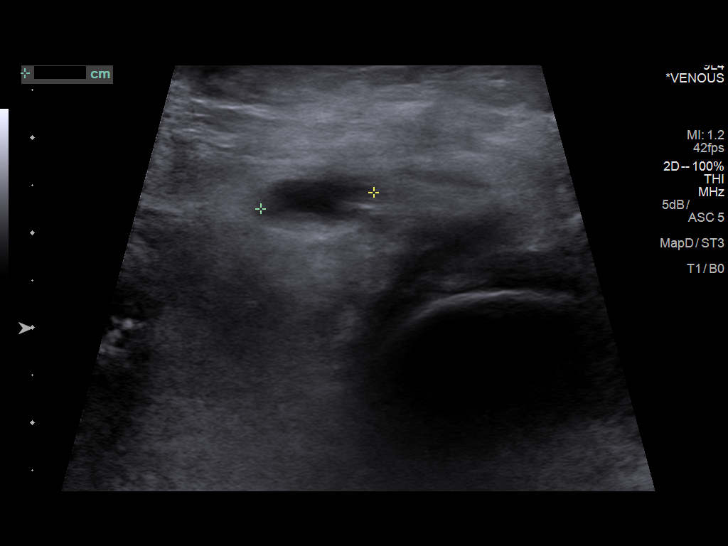
[im 26/28]
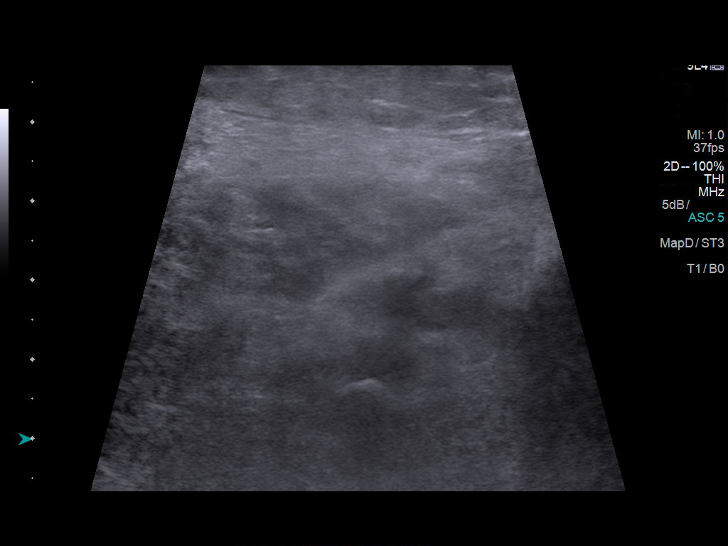
[im 28/28]
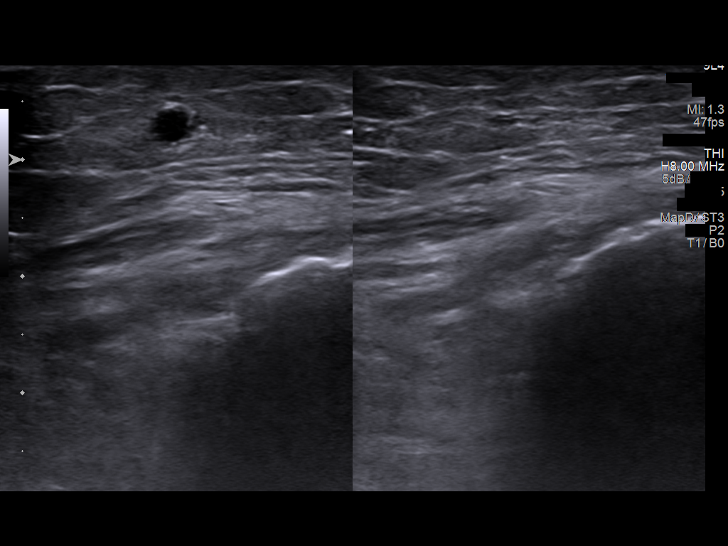

[13 of 24 positions shown; findings below may reference images not displayed]

FINDINGS: Contralateral Common Femoral Vein: Respiratory phasicity is normal
and symmetric with the symptomatic side. No evidence of thrombus.
Normal compressibility.

Common Femoral Vein: No evidence of thrombus. Normal
compressibility, respiratory phasicity and response to augmentation.

Saphenofemoral Junction: No evidence of thrombus. Normal
compressibility and flow on color Doppler imaging.

Profunda Femoral Vein: No evidence of thrombus. Normal
compressibility and flow on color Doppler imaging.

Femoral Vein: No evidence of thrombus. Normal compressibility,
respiratory phasicity and response to augmentation.

Popliteal Vein: No evidence of thrombus. Normal compressibility,
respiratory phasicity and response to augmentation.

Calf Veins: No evidence of thrombus. Normal compressibility and flow
on color Doppler imaging.

Superficial Great Saphenous Vein: No evidence of thrombus. Normal
compressibility and flow on color Doppler imaging.

Other Findings: Lentiform fluid collection in the left popliteal
fossa measuring 3.3 cm x 0.5 cm x 1.2 cm.
IMPRESSION: Sonographic survey of the left lower extremity negative for DVT.

Lentiform fluid collection in the popliteal fossa, most likely a
Baker's cyst.

## 2019-02-19 IMAGING — CT CT HEAD W/O CM
4 series · 15 of 47 positions shown, 17 images · non-contrast
Comparison: CT scan of December 07, 2014.

CLINICAL DATA: Altered mental status.

EXAM:
CT HEAD WITHOUT CONTRAST
TECHNIQUE: Contiguous axial images were obtained from the base of the skull
through the vertex without intravenous contrast.

[Series 3: head without · axial · non-contrast · 0.40mm/px · z∈[-97,+8]mm · 7 of 29 slices shown, 9 images]
[im 4/29  brain]
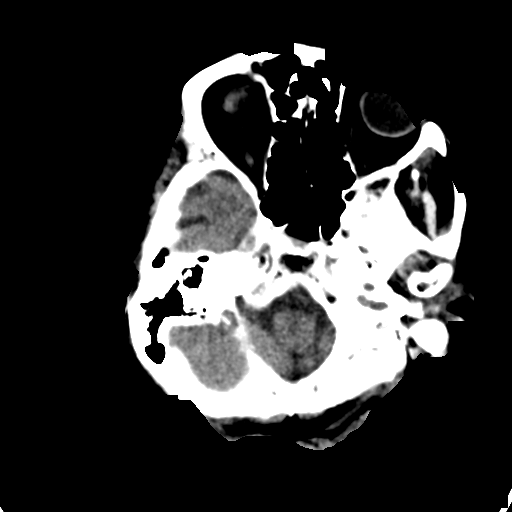
[im 4/29  bone]
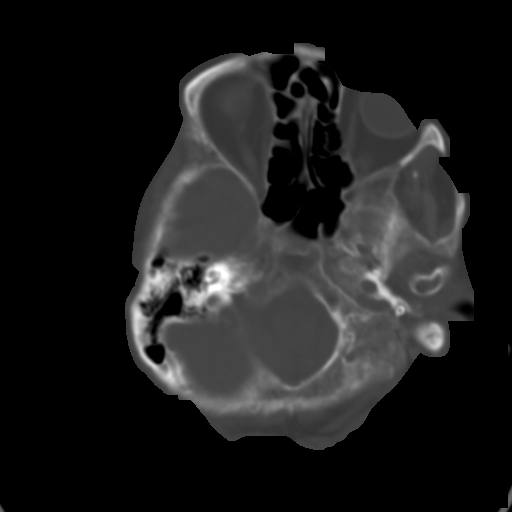
[im 8/29  brain]
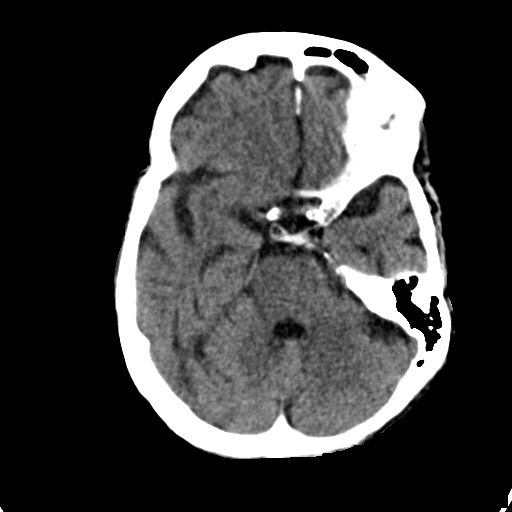
[im 11/29  brain]
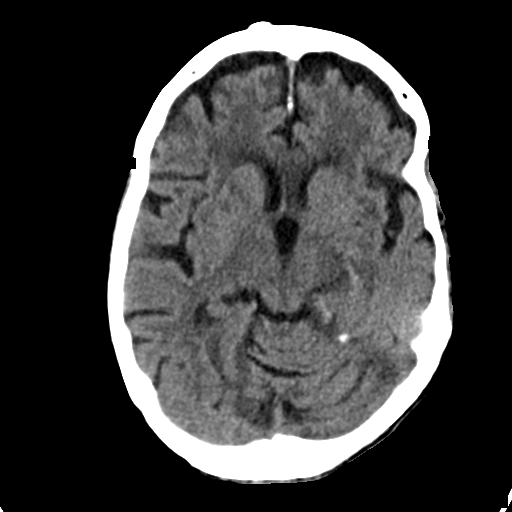
[im 15/29  brain]
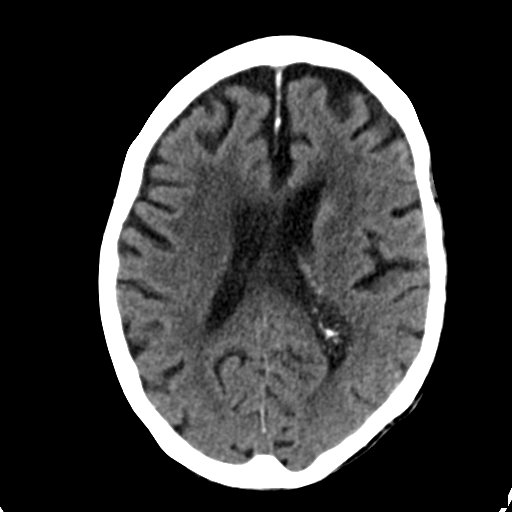
[im 18/29  brain]
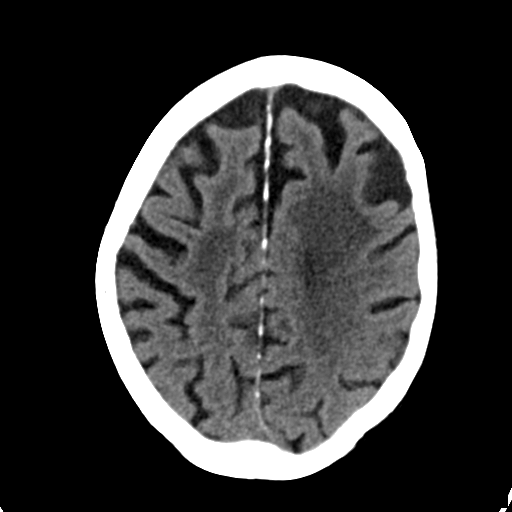
[im 18/29  bone]
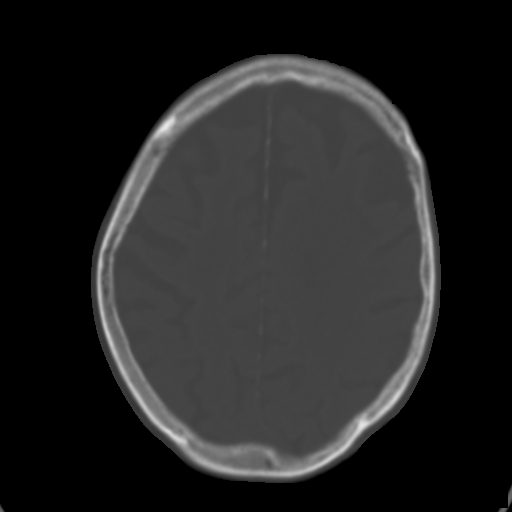
[im 22/29  brain]
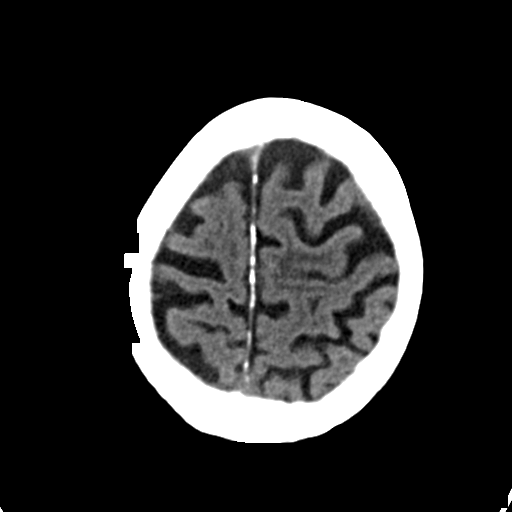
[im 25/29  brain]
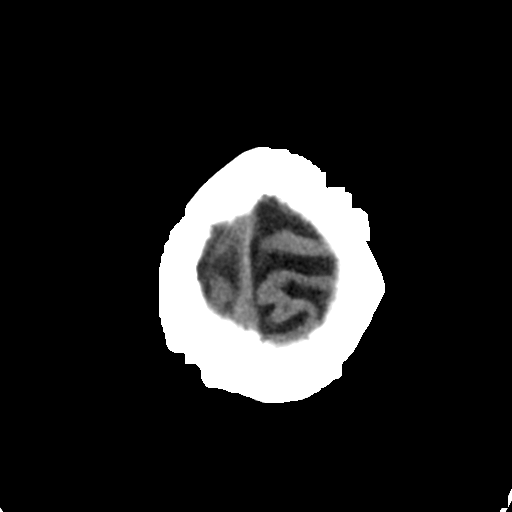

[Series 4: head bone · axial · 0.40mm/px · z∈[-98,-84]mm · 2 of 72 slices shown]
[im 8/72  bone]
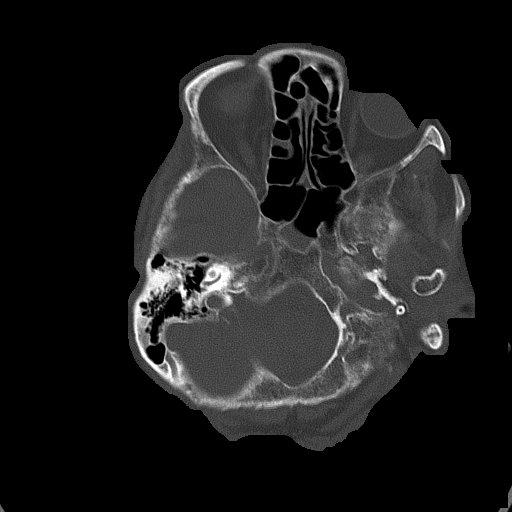
[im 15/72  bone]
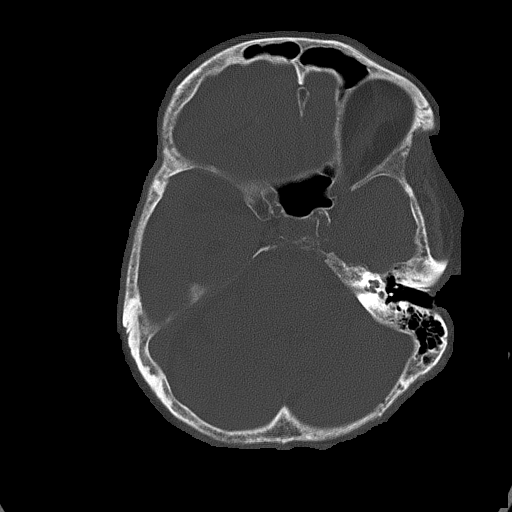

[Series 5: head without cor · coronal · non-contrast · 0.28mm/px · 3 of 67 slices shown]
[im 23/67  brain]
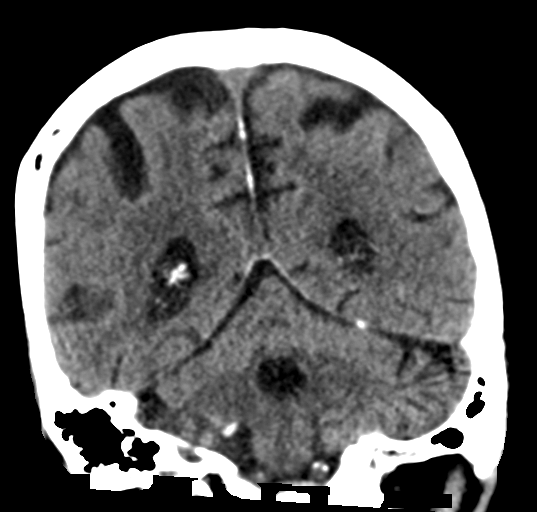
[im 30/67  brain]
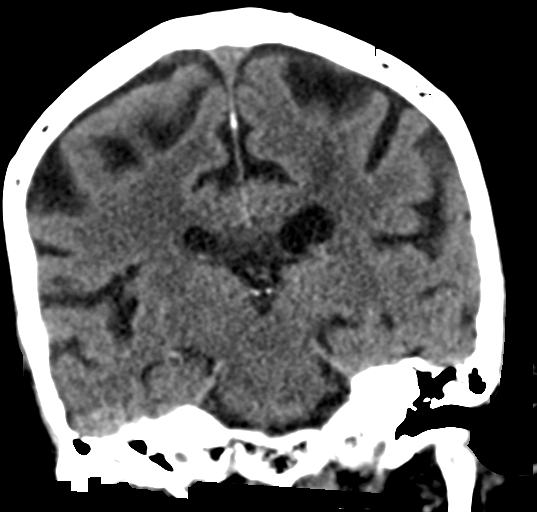
[im 37/67  brain]
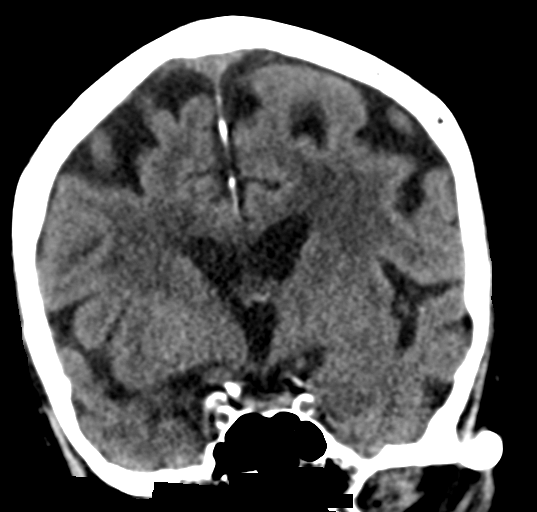

[Series 6: head without sag · sagittal · non-contrast · 0.28mm/px · 3 of 62 slices shown]
[im 22/62  brain]
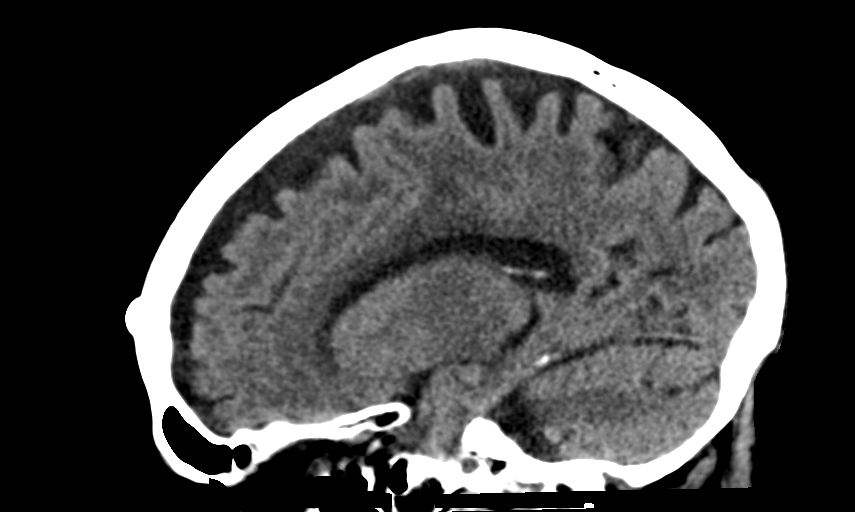
[im 29/62  brain]
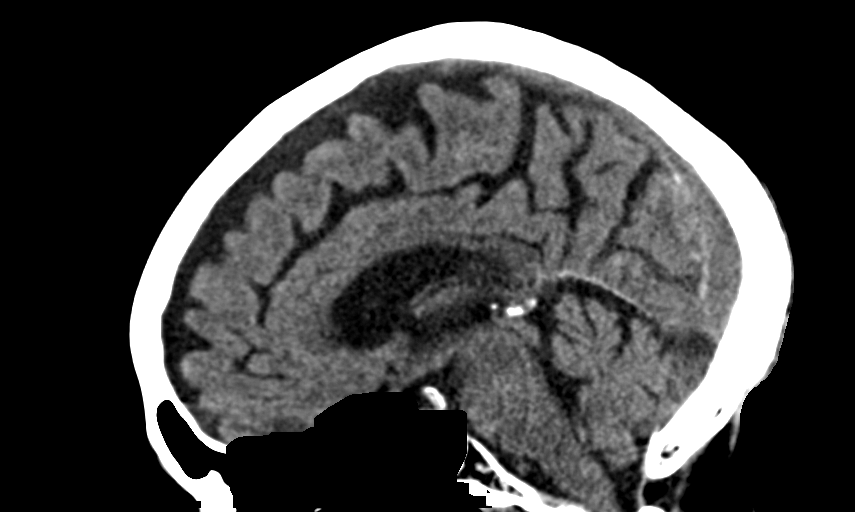
[im 37/62  brain]
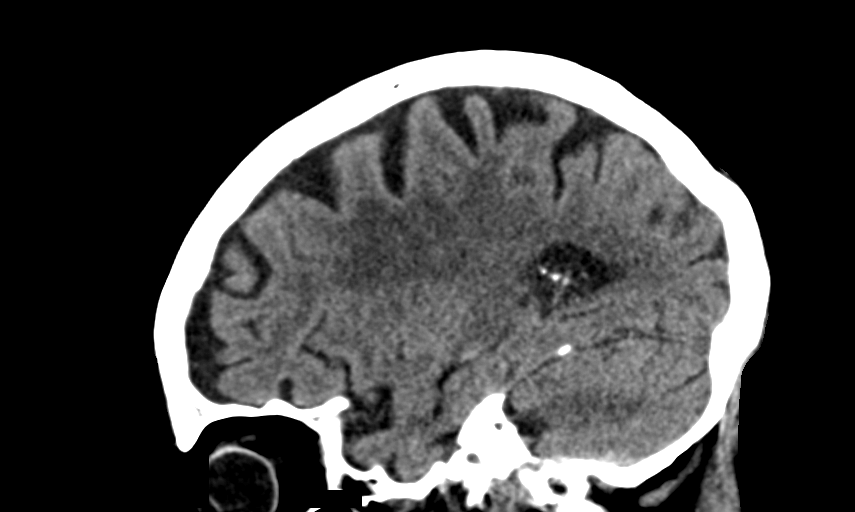

[15 of 47 positions shown; findings below may reference images not displayed]

FINDINGS: Brain: Mild diffuse cortical atrophy is noted. Mild chronic ischemic
white matter disease is noted. No mass effect or midline shift is
noted. Ventricular size is within normal limits. There is no
evidence of mass lesion, hemorrhage or acute infarction.

Vascular: No hyperdense vessel or unexpected calcification.

Skull: Normal. Negative for fracture or focal lesion.

Sinuses/Orbits: Mild sphenoid sinusitis is noted.

Other: None.
IMPRESSION: Mild diffuse cortical atrophy. Mild chronic ischemic white matter
disease. No acute intracranial abnormality seen.

## 2019-02-19 IMAGING — DX DG CHEST 1V PORT
1 series · 1 of 1 positions shown · non-contrast
Comparison: 11/10/2016

CLINICAL DATA: Atrial fibrillation

EXAM:
PORTABLE CHEST 1 VIEW

[chest ap]
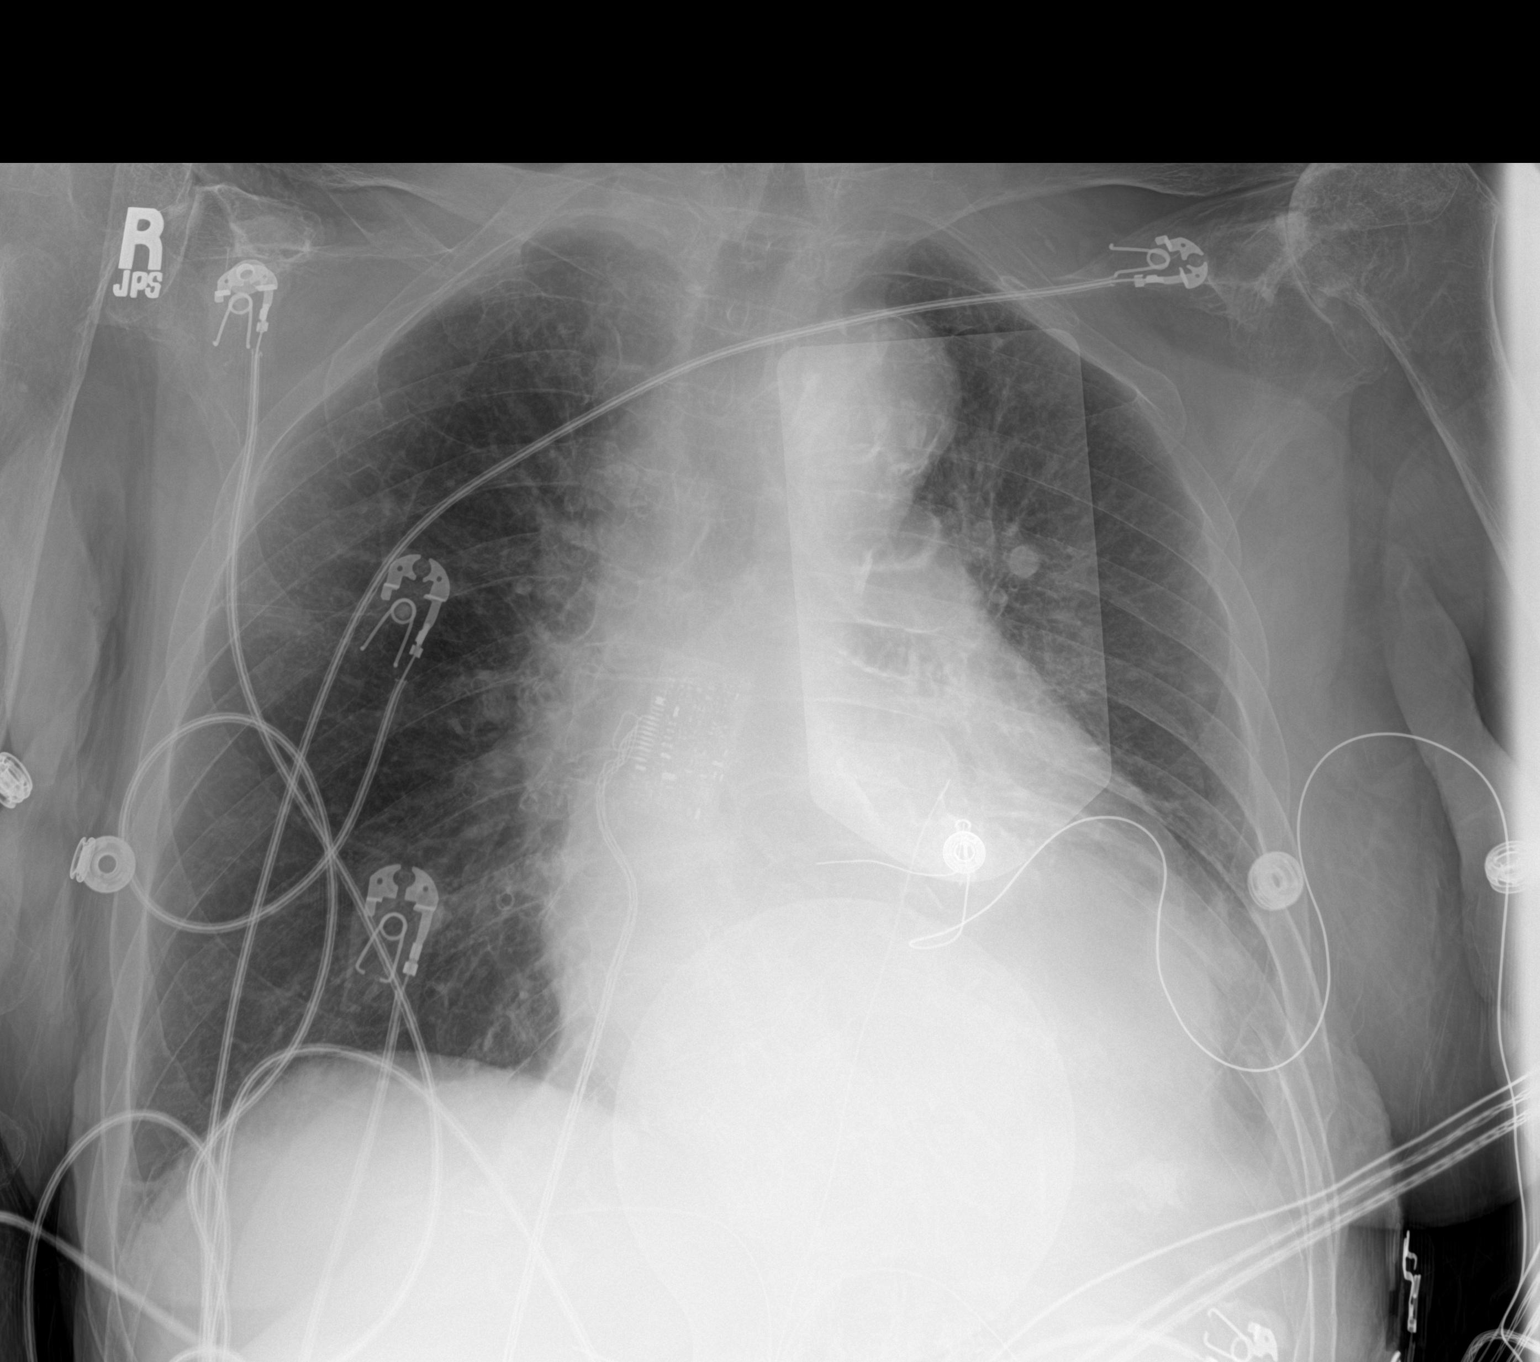

[1 of 1 positions shown; findings below may reference images not displayed]

FINDINGS: Cardiomegaly with vascular congestion. Left lower lobe atelectasis
or infiltrate. Right lung is clear. No acute bony abnormality.
Advanced degenerative changes in the shoulders.
IMPRESSION: Cardiomegaly with vascular congestion.

Left lower lobe atelectasis or infiltrate. This is similar to prior
study.

## 2023-08-11 NOTE — Progress Notes (Signed)
Nail trim
